# Patient Record
Sex: Female | Born: 1935
Health system: Southern US, Community
[De-identification: ages and names within clinical notes are randomized; demographics above are authoritative.]

## PROBLEM LIST (undated history)

## (undated) DIAGNOSIS — R0989 Other specified symptoms and signs involving the circulatory and respiratory systems: Secondary | ICD-10-CM

## (undated) DIAGNOSIS — R911 Solitary pulmonary nodule: Secondary | ICD-10-CM

## (undated) DIAGNOSIS — G8929 Other chronic pain: Secondary | ICD-10-CM

## (undated) DIAGNOSIS — M810 Age-related osteoporosis without current pathological fracture: Secondary | ICD-10-CM

## (undated) DIAGNOSIS — F419 Anxiety disorder, unspecified: Secondary | ICD-10-CM

## (undated) DIAGNOSIS — R002 Palpitations: Secondary | ICD-10-CM

## (undated) DIAGNOSIS — M1990 Unspecified osteoarthritis, unspecified site: Secondary | ICD-10-CM

## (undated) DIAGNOSIS — K589 Irritable bowel syndrome without diarrhea: Secondary | ICD-10-CM

## (undated) DIAGNOSIS — E785 Hyperlipidemia, unspecified: Secondary | ICD-10-CM

## (undated) DIAGNOSIS — D61818 Other pancytopenia: Secondary | ICD-10-CM

## (undated) DIAGNOSIS — T783XXA Angioneurotic edema, initial encounter: Secondary | ICD-10-CM

## (undated) DIAGNOSIS — G5600 Carpal tunnel syndrome, unspecified upper limb: Secondary | ICD-10-CM

## (undated) DIAGNOSIS — R159 Full incontinence of feces: Secondary | ICD-10-CM

## (undated) DIAGNOSIS — G4700 Insomnia, unspecified: Secondary | ICD-10-CM

## (undated) DIAGNOSIS — J309 Allergic rhinitis, unspecified: Secondary | ICD-10-CM

## (undated) DIAGNOSIS — R109 Unspecified abdominal pain: Secondary | ICD-10-CM

## (undated) DIAGNOSIS — K219 Gastro-esophageal reflux disease without esophagitis: Secondary | ICD-10-CM

## (undated) HISTORY — DX: Irritable bowel syndrome, unspecified: K58.9

## (undated) HISTORY — DX: Unspecified abdominal pain: R10.9

## (undated) HISTORY — DX: Carpal tunnel syndrome, unspecified upper limb: G56.00

## (undated) HISTORY — DX: Insomnia, unspecified: G47.00

## (undated) HISTORY — DX: Age-related osteoporosis without current pathological fracture: M81.0

## (undated) HISTORY — DX: Palpitations: R00.2

## (undated) HISTORY — DX: Other pancytopenia: D61.818

## (undated) HISTORY — DX: Other specified symptoms and signs involving the circulatory and respiratory systems: R09.89

## (undated) HISTORY — DX: Anxiety disorder, unspecified: F41.9

## (undated) HISTORY — DX: Allergic rhinitis, unspecified: J30.9

## (undated) HISTORY — DX: Full incontinence of feces: R15.9

## (undated) HISTORY — DX: Unspecified osteoarthritis, unspecified site: M19.90

## (undated) HISTORY — DX: Hyperlipidemia, unspecified: E78.5

## (undated) HISTORY — PX: EYE SURGERY: SHX253

## (undated) HISTORY — DX: Solitary pulmonary nodule: R91.1

## (undated) HISTORY — DX: Angioneurotic edema, initial encounter: T78.3XXA

## (undated) HISTORY — DX: Other chronic pain: G89.29

## (undated) NOTE — *Deleted (*Deleted)
[facility] EMERGENCY DEPARTMENT Provider Note   CSN: [id number] Arrival date & time: 05/16/20  1438     History Chief Complaint  Patient presents with  . Altered Mental Status    Kathleen Gallegos is a 37 y.o. female.  HPI     Past Medical History:  Diagnosis Date  . Allergic rhinitis, cause unspecified   . Angioneurotic edema not elsewhere classified   . Anxiety disorder   . Asthma   . Carotid bruit   . Carpal tunnel syndrome   . Chronic abdominal pain   . Fecal incontinence   . GERD (gastroesophageal reflux disease)   . Hyperlipidemia   . IBS (irritable bowel syndrome)   . Insomnia   . Lung nodule   . Osteoarthritis   . Palpitations   . Pancytopenia (HCC)   . Senile osteoporosis     Patient Active Problem List   Diagnosis Date Noted  . Unspecified symptoms and signs involving cognitive functions and awareness 12/24/2016  . Vitamin D deficiency 03/12/2015  . Dementia (HCC) 01/04/2015  . Osteoporosis 08/05/2014  . Medicare annual wellness visit, subsequent 04/07/2014  . Cough variant asthma 07/28/2013  . Dry skin dermatitis 02/15/2013  . HEMORRHOIDS, WITH BLEEDING 07/27/2009  . TRANSIENT ISCHEMIC ATTACK 03/29/2009  . PALPITATIONS 03/25/2009  . IRRITABLE BOWEL SYNDROME 10/05/2008  . FECAL INCONTINENCE 10/05/2008  . GERD 05/24/2008  . Hyperlipemia 11/10/2007  . Osteoarthritis 11/10/2007  . Allergic rhinitis due to pollen 09/18/2007  . LUNG NODULE 09/18/2007    Past Surgical History:  Procedure Laterality Date  . ABDOMINAL HYSTERECTOMY  1979   fibroids   . bilateral cataract surgery  2008  . BIOPSY THYROID  1970   . COLONOSCOPY  12/21/04   normal -redundant   . EUS  08/07/07  . EYE SURGERY       OB History   No obstetric history on file.     Family History  Problem Relation Age of Onset  . Hypertension Mother   . Pancreatic cancer Father   . Cancer Brother   . Depression Daughter   . Heart disease Sister   . Leukemia Other         family history   . Colon cancer Other        family history     Social History   Tobacco Use  . Smoking status: Never Smoker  . Smokeless tobacco: Former Neurosurgeon    Types: Snuff  Vaping Use  . Vaping Use: Never used  Substance Use Topics  . Alcohol use: No  . Drug use: Not Currently    Home Medications Prior to Admission medications   Medication Sig Start Date End Date Taking? Authorizing Provider  donepezil (ARICEPT) 10 MG tablet Take 1 tablet (10 mg total) by mouth at bedtime. 12/22/19   Ngetich, [name] C, NP  fexofenadine (ALLEGRA) 180 MG tablet Take 180 mg by mouth daily.    [provider]  fluticasone (FLONASE) 50 MCG/ACT nasal spray Place 1 spray into both nostrils daily.    [provider]  Melatonin 10 MG TABS Take 5 mg by mouth daily.     [provider]  metoprolol succinate (TOPROL-XL) 25 MG 24 hr tablet Take one tablet by mouth once daily. 12/11/19   Ngetich, [name] C, NP  sertraline (ZOLOFT) 50 MG tablet Take 1 tablet (50 mg total) by mouth daily. 05/12/20   Ngetich, [name] C, NP  vitamin C (ASCORBIC ACID) 500 MG tablet Take 500 mg  by mouth daily.    [provider]    Allergies    Diphenhydramine hcl and Rivastigmine tartrate  Review of Systems   Review of Systems  Physical Exam Updated Vital Signs BP 122/84 (BP Location: Right Arm)   Pulse 68   Temp 98.7 F (37.1 C) (Oral)   Resp 16   Ht 5\' 6"  (1.676 m)   Wt 60 kg   SpO2 99%   BMI 21.35 kg/m   Physical Exam  ED Results / Procedures / Treatments   Labs (all labs ordered are listed, but only abnormal results are displayed) Labs Reviewed - No data to display  EKG None  Radiology No results found.  Procedures Procedures (including critical care time)  Medications Ordered in ED Medications - No data to display  ED Course  I have reviewed the triage vital signs and the nursing notes.  Pertinent labs & imaging results that were available during my care of the  patient were reviewed by me and considered in my medical decision making (see chart for details).    MDM Rules/Calculators/A&P                          *** Final Clinical Impression(s) / ED Diagnoses Final diagnoses:  None    Rx / DC Orders ED Discharge Orders    None

---

## 1968-07-09 HISTORY — PX: BIOPSY THYROID: PRO38

## 1977-07-09 HISTORY — PX: ABDOMINAL HYSTERECTOMY: SHX81

## 2001-01-02 ENCOUNTER — Ambulatory Visit (HOSPITAL_COMMUNITY): Admission: RE | Admit: 2001-01-02 | Discharge: 2001-01-06 | Payer: Self-pay | Admitting: Family Medicine

## 2001-01-06 ENCOUNTER — Encounter: Payer: Self-pay | Admitting: Family Medicine

## 2001-03-05 ENCOUNTER — Other Ambulatory Visit: Admission: RE | Admit: 2001-03-05 | Discharge: 2001-03-05 | Payer: Self-pay | Admitting: Family Medicine

## 2001-06-18 ENCOUNTER — Ambulatory Visit (HOSPITAL_COMMUNITY): Admission: RE | Admit: 2001-06-18 | Discharge: 2001-06-18 | Payer: Self-pay | Admitting: Family Medicine

## 2001-06-18 ENCOUNTER — Encounter: Payer: Self-pay | Admitting: Family Medicine

## 2001-09-17 ENCOUNTER — Ambulatory Visit (HOSPITAL_COMMUNITY): Admission: RE | Admit: 2001-09-17 | Discharge: 2001-09-17 | Payer: Self-pay | Admitting: Family Medicine

## 2001-09-17 ENCOUNTER — Encounter: Payer: Self-pay | Admitting: Family Medicine

## 2002-06-18 ENCOUNTER — Ambulatory Visit (HOSPITAL_COMMUNITY): Admission: RE | Admit: 2002-06-18 | Discharge: 2002-06-18 | Payer: Self-pay | Admitting: Internal Medicine

## 2002-06-18 ENCOUNTER — Encounter: Payer: Self-pay | Admitting: Internal Medicine

## 2003-06-23 ENCOUNTER — Ambulatory Visit (HOSPITAL_COMMUNITY): Admission: RE | Admit: 2003-06-23 | Discharge: 2003-06-23 | Payer: Self-pay | Admitting: Internal Medicine

## 2004-05-19 ENCOUNTER — Ambulatory Visit: Payer: Self-pay | Admitting: Family Medicine

## 2004-06-21 ENCOUNTER — Emergency Department (HOSPITAL_COMMUNITY): Admission: EM | Admit: 2004-06-21 | Discharge: 2004-06-22 | Payer: Self-pay | Admitting: *Deleted

## 2004-06-21 ENCOUNTER — Ambulatory Visit: Payer: Self-pay | Admitting: Family Medicine

## 2004-07-07 ENCOUNTER — Ambulatory Visit (HOSPITAL_COMMUNITY): Admission: RE | Admit: 2004-07-07 | Discharge: 2004-07-07 | Payer: Self-pay | Admitting: Family Medicine

## 2004-07-18 ENCOUNTER — Ambulatory Visit: Payer: Self-pay | Admitting: Family Medicine

## 2004-07-25 ENCOUNTER — Ambulatory Visit: Payer: Self-pay | Admitting: Internal Medicine

## 2004-08-24 ENCOUNTER — Ambulatory Visit: Payer: Self-pay | Admitting: Internal Medicine

## 2004-09-05 ENCOUNTER — Ambulatory Visit: Payer: Self-pay | Admitting: Family Medicine

## 2004-10-06 ENCOUNTER — Ambulatory Visit: Payer: Self-pay | Admitting: Internal Medicine

## 2004-10-31 ENCOUNTER — Ambulatory Visit: Payer: Self-pay | Admitting: Family Medicine

## 2004-11-22 ENCOUNTER — Ambulatory Visit: Payer: Self-pay | Admitting: Internal Medicine

## 2004-12-15 ENCOUNTER — Ambulatory Visit: Payer: Self-pay | Admitting: Family Medicine

## 2004-12-21 ENCOUNTER — Ambulatory Visit (HOSPITAL_COMMUNITY): Admission: RE | Admit: 2004-12-21 | Discharge: 2004-12-21 | Payer: Self-pay | Admitting: Internal Medicine

## 2004-12-21 ENCOUNTER — Ambulatory Visit: Payer: Self-pay | Admitting: Internal Medicine

## 2004-12-21 HISTORY — PX: COLONOSCOPY: SHX174

## 2004-12-25 ENCOUNTER — Emergency Department (HOSPITAL_COMMUNITY): Admission: EM | Admit: 2004-12-25 | Discharge: 2004-12-25 | Payer: Self-pay | Admitting: Emergency Medicine

## 2004-12-28 ENCOUNTER — Ambulatory Visit: Payer: Self-pay | Admitting: Family Medicine

## 2005-02-19 ENCOUNTER — Ambulatory Visit: Payer: Self-pay | Admitting: Family Medicine

## 2005-03-15 ENCOUNTER — Ambulatory Visit: Payer: Self-pay | Admitting: Family Medicine

## 2005-03-20 ENCOUNTER — Ambulatory Visit: Payer: Self-pay | Admitting: Internal Medicine

## 2005-04-09 ENCOUNTER — Encounter: Payer: Self-pay | Admitting: Family Medicine

## 2005-04-18 ENCOUNTER — Ambulatory Visit: Payer: Self-pay | Admitting: Family Medicine

## 2005-04-23 ENCOUNTER — Ambulatory Visit: Payer: Self-pay | Admitting: Orthopedic Surgery

## 2005-06-05 ENCOUNTER — Ambulatory Visit: Payer: Self-pay | Admitting: Internal Medicine

## 2005-07-04 ENCOUNTER — Ambulatory Visit: Payer: Self-pay | Admitting: Family Medicine

## 2005-07-10 ENCOUNTER — Ambulatory Visit (HOSPITAL_COMMUNITY): Admission: RE | Admit: 2005-07-10 | Discharge: 2005-07-10 | Payer: Self-pay | Admitting: Family Medicine

## 2005-07-12 ENCOUNTER — Ambulatory Visit: Payer: Self-pay | Admitting: Internal Medicine

## 2005-08-20 ENCOUNTER — Ambulatory Visit: Payer: Self-pay | Admitting: Family Medicine

## 2005-10-08 ENCOUNTER — Ambulatory Visit: Payer: Self-pay | Admitting: Family Medicine

## 2005-10-10 ENCOUNTER — Encounter (HOSPITAL_COMMUNITY): Admission: RE | Admit: 2005-10-10 | Discharge: 2005-11-09 | Payer: Self-pay | Admitting: Family Medicine

## 2005-11-05 ENCOUNTER — Ambulatory Visit: Payer: Self-pay | Admitting: Family Medicine

## 2005-11-12 ENCOUNTER — Ambulatory Visit: Payer: Self-pay | Admitting: Internal Medicine

## 2005-11-15 ENCOUNTER — Ambulatory Visit: Payer: Self-pay | Admitting: Orthopedic Surgery

## 2006-01-21 ENCOUNTER — Ambulatory Visit: Payer: Self-pay | Admitting: Internal Medicine

## 2006-02-11 ENCOUNTER — Ambulatory Visit: Payer: Self-pay | Admitting: Family Medicine

## 2006-02-14 ENCOUNTER — Ambulatory Visit (HOSPITAL_COMMUNITY): Admission: RE | Admit: 2006-02-14 | Discharge: 2006-02-14 | Payer: Self-pay | Admitting: Family Medicine

## 2006-03-29 ENCOUNTER — Ambulatory Visit: Payer: Self-pay | Admitting: Internal Medicine

## 2006-04-18 ENCOUNTER — Ambulatory Visit: Payer: Self-pay | Admitting: Family Medicine

## 2006-04-25 ENCOUNTER — Ambulatory Visit (HOSPITAL_COMMUNITY): Admission: RE | Admit: 2006-04-25 | Discharge: 2006-04-25 | Payer: Self-pay | Admitting: Family Medicine

## 2006-04-26 ENCOUNTER — Encounter (HOSPITAL_COMMUNITY): Admission: RE | Admit: 2006-04-26 | Discharge: 2006-05-26 | Payer: Self-pay | Admitting: Family Medicine

## 2006-05-15 ENCOUNTER — Ambulatory Visit: Payer: Self-pay | Admitting: Family Medicine

## 2006-05-29 ENCOUNTER — Ambulatory Visit: Payer: Self-pay | Admitting: Family Medicine

## 2006-06-05 ENCOUNTER — Ambulatory Visit: Payer: Self-pay | Admitting: Family Medicine

## 2006-06-12 ENCOUNTER — Ambulatory Visit: Payer: Self-pay | Admitting: Family Medicine

## 2006-06-18 ENCOUNTER — Emergency Department (HOSPITAL_COMMUNITY): Admission: EM | Admit: 2006-06-18 | Discharge: 2006-06-18 | Payer: Self-pay | Admitting: Emergency Medicine

## 2006-06-20 ENCOUNTER — Ambulatory Visit: Payer: Self-pay | Admitting: Family Medicine

## 2006-06-20 ENCOUNTER — Ambulatory Visit (HOSPITAL_COMMUNITY): Admission: RE | Admit: 2006-06-20 | Discharge: 2006-06-20 | Payer: Self-pay | Admitting: Family Medicine

## 2006-07-09 HISTORY — PX: OTHER SURGICAL HISTORY: SHX169

## 2006-07-15 ENCOUNTER — Ambulatory Visit (HOSPITAL_COMMUNITY): Admission: RE | Admit: 2006-07-15 | Discharge: 2006-07-15 | Payer: Self-pay | Admitting: Family Medicine

## 2006-07-22 ENCOUNTER — Ambulatory Visit: Payer: Self-pay | Admitting: Internal Medicine

## 2006-07-23 ENCOUNTER — Ambulatory Visit: Payer: Self-pay | Admitting: Family Medicine

## 2006-07-23 ENCOUNTER — Ambulatory Visit (HOSPITAL_COMMUNITY): Admission: RE | Admit: 2006-07-23 | Discharge: 2006-07-23 | Payer: Self-pay | Admitting: Family Medicine

## 2006-07-31 ENCOUNTER — Ambulatory Visit: Payer: Self-pay | Admitting: Family Medicine

## 2006-08-07 ENCOUNTER — Ambulatory Visit: Payer: Self-pay | Admitting: Family Medicine

## 2006-08-14 ENCOUNTER — Ambulatory Visit: Payer: Self-pay | Admitting: Family Medicine

## 2006-08-23 ENCOUNTER — Ambulatory Visit: Payer: Self-pay | Admitting: Family Medicine

## 2006-08-24 ENCOUNTER — Encounter: Payer: Self-pay | Admitting: Family Medicine

## 2006-08-24 LAB — CONVERTED CEMR LAB
Chlamydia, DNA Probe: NEGATIVE
Gardnerella vaginalis: NEGATIVE

## 2006-08-28 ENCOUNTER — Ambulatory Visit: Payer: Self-pay | Admitting: Family Medicine

## 2006-09-05 ENCOUNTER — Ambulatory Visit: Payer: Self-pay | Admitting: Family Medicine

## 2006-09-05 ENCOUNTER — Ambulatory Visit: Payer: Self-pay | Admitting: Internal Medicine

## 2006-09-11 ENCOUNTER — Ambulatory Visit: Payer: Self-pay | Admitting: Family Medicine

## 2006-09-18 ENCOUNTER — Ambulatory Visit: Payer: Self-pay | Admitting: Family Medicine

## 2006-09-26 ENCOUNTER — Ambulatory Visit: Payer: Self-pay | Admitting: Family Medicine

## 2006-10-02 ENCOUNTER — Ambulatory Visit: Payer: Self-pay | Admitting: Family Medicine

## 2006-10-11 ENCOUNTER — Ambulatory Visit: Payer: Self-pay | Admitting: Family Medicine

## 2006-10-16 ENCOUNTER — Ambulatory Visit: Payer: Self-pay | Admitting: Family Medicine

## 2006-10-31 ENCOUNTER — Ambulatory Visit: Payer: Self-pay | Admitting: Family Medicine

## 2006-11-06 ENCOUNTER — Ambulatory Visit: Payer: Self-pay | Admitting: Family Medicine

## 2006-11-13 ENCOUNTER — Ambulatory Visit: Payer: Self-pay | Admitting: Family Medicine

## 2006-11-20 ENCOUNTER — Ambulatory Visit: Payer: Self-pay | Admitting: Family Medicine

## 2006-11-26 ENCOUNTER — Ambulatory Visit: Payer: Self-pay | Admitting: Internal Medicine

## 2006-11-28 ENCOUNTER — Ambulatory Visit: Payer: Self-pay | Admitting: Family Medicine

## 2006-12-04 ENCOUNTER — Ambulatory Visit: Payer: Self-pay | Admitting: Family Medicine

## 2006-12-09 ENCOUNTER — Ambulatory Visit: Payer: Self-pay | Admitting: Internal Medicine

## 2006-12-11 ENCOUNTER — Ambulatory Visit: Payer: Self-pay | Admitting: Family Medicine

## 2006-12-18 ENCOUNTER — Ambulatory Visit (HOSPITAL_COMMUNITY): Admission: RE | Admit: 2006-12-18 | Discharge: 2006-12-18 | Payer: Self-pay | Admitting: Internal Medicine

## 2006-12-19 ENCOUNTER — Ambulatory Visit: Payer: Self-pay | Admitting: Family Medicine

## 2006-12-26 ENCOUNTER — Ambulatory Visit: Payer: Self-pay | Admitting: Family Medicine

## 2006-12-27 ENCOUNTER — Encounter: Payer: Self-pay | Admitting: Family Medicine

## 2006-12-27 ENCOUNTER — Ambulatory Visit (HOSPITAL_COMMUNITY): Admission: RE | Admit: 2006-12-27 | Discharge: 2006-12-27 | Payer: Self-pay | Admitting: Family Medicine

## 2006-12-27 LAB — CONVERTED CEMR LAB
ALT: 18 units/L (ref 0–35)
AST: 23 units/L (ref 0–37)
Alkaline Phosphatase: 46 units/L (ref 39–117)
CO2: 32 meq/L (ref 19–32)
Chloride: 102 meq/L (ref 96–112)
Creatinine, Ser: 0.75 mg/dL (ref 0.40–1.20)
Indirect Bilirubin: 0.6 mg/dL (ref 0.0–0.9)
Potassium: 4 meq/L (ref 3.5–5.3)
Sodium: 136 meq/L (ref 135–145)
Total Protein: 7.9 g/dL (ref 6.0–8.3)

## 2006-12-31 ENCOUNTER — Ambulatory Visit (HOSPITAL_COMMUNITY): Admission: RE | Admit: 2006-12-31 | Discharge: 2006-12-31 | Payer: Self-pay | Admitting: Ophthalmology

## 2007-01-06 ENCOUNTER — Ambulatory Visit: Payer: Self-pay | Admitting: Family Medicine

## 2007-01-17 ENCOUNTER — Ambulatory Visit: Payer: Self-pay | Admitting: Family Medicine

## 2007-01-21 ENCOUNTER — Ambulatory Visit (HOSPITAL_COMMUNITY): Admission: RE | Admit: 2007-01-21 | Discharge: 2007-01-21 | Payer: Self-pay | Admitting: Ophthalmology

## 2007-01-24 ENCOUNTER — Ambulatory Visit: Payer: Self-pay | Admitting: Family Medicine

## 2007-01-27 ENCOUNTER — Ambulatory Visit: Payer: Self-pay | Admitting: Internal Medicine

## 2007-01-29 ENCOUNTER — Ambulatory Visit: Payer: Self-pay | Admitting: Family Medicine

## 2007-01-30 ENCOUNTER — Ambulatory Visit: Payer: Self-pay | Admitting: Internal Medicine

## 2007-02-03 ENCOUNTER — Ambulatory Visit (HOSPITAL_COMMUNITY): Admission: RE | Admit: 2007-02-03 | Discharge: 2007-02-03 | Payer: Self-pay | Admitting: Family Medicine

## 2007-02-05 ENCOUNTER — Ambulatory Visit: Payer: Self-pay | Admitting: Family Medicine

## 2007-02-07 ENCOUNTER — Ambulatory Visit: Payer: Self-pay | Admitting: Internal Medicine

## 2007-02-10 ENCOUNTER — Ambulatory Visit: Payer: Self-pay | Admitting: Internal Medicine

## 2007-02-10 ENCOUNTER — Ambulatory Visit (HOSPITAL_COMMUNITY): Admission: RE | Admit: 2007-02-10 | Discharge: 2007-02-10 | Payer: Self-pay | Admitting: Internal Medicine

## 2007-02-11 ENCOUNTER — Ambulatory Visit (HOSPITAL_COMMUNITY): Admission: RE | Admit: 2007-02-11 | Discharge: 2007-02-11 | Payer: Self-pay | Admitting: Internal Medicine

## 2007-02-18 ENCOUNTER — Ambulatory Visit (HOSPITAL_COMMUNITY): Admission: RE | Admit: 2007-02-18 | Discharge: 2007-02-18 | Payer: Self-pay | Admitting: Ophthalmology

## 2007-03-06 ENCOUNTER — Ambulatory Visit: Payer: Self-pay | Admitting: Family Medicine

## 2007-03-13 ENCOUNTER — Ambulatory Visit: Payer: Self-pay | Admitting: Family Medicine

## 2007-03-25 ENCOUNTER — Ambulatory Visit: Payer: Self-pay | Admitting: Internal Medicine

## 2007-03-25 ENCOUNTER — Ambulatory Visit: Payer: Self-pay | Admitting: Family Medicine

## 2007-03-31 ENCOUNTER — Ambulatory Visit: Payer: Self-pay | Admitting: Family Medicine

## 2007-04-07 ENCOUNTER — Ambulatory Visit: Payer: Self-pay | Admitting: Family Medicine

## 2007-04-16 ENCOUNTER — Ambulatory Visit: Payer: Self-pay | Admitting: Family Medicine

## 2007-04-21 ENCOUNTER — Encounter: Payer: Self-pay | Admitting: Family Medicine

## 2007-04-21 ENCOUNTER — Ambulatory Visit: Payer: Self-pay | Admitting: Family Medicine

## 2007-04-21 ENCOUNTER — Other Ambulatory Visit: Admission: RE | Admit: 2007-04-21 | Discharge: 2007-04-21 | Payer: Self-pay | Admitting: Family Medicine

## 2007-04-21 ENCOUNTER — Encounter (INDEPENDENT_AMBULATORY_CARE_PROVIDER_SITE_OTHER): Payer: Self-pay | Admitting: *Deleted

## 2007-04-21 LAB — CONVERTED CEMR LAB: Pap Smear: NORMAL

## 2007-04-23 ENCOUNTER — Ambulatory Visit: Payer: Self-pay | Admitting: Family Medicine

## 2007-05-08 ENCOUNTER — Ambulatory Visit: Payer: Self-pay | Admitting: Family Medicine

## 2007-05-08 LAB — CONVERTED CEMR LAB
ALT: 13 units/L (ref 0–35)
Albumin: 4.2 g/dL (ref 3.5–5.2)
Alkaline Phosphatase: 29 units/L — ABNORMAL LOW (ref 39–117)
Basophils Absolute: 0 10*3/uL (ref 0.0–0.1)
Basophils Relative: 0 % (ref 0–1)
Chloride: 104 meq/L (ref 96–112)
Cholesterol: 177 mg/dL (ref 0–200)
Eosinophils Absolute: 0.2 10*3/uL (ref 0.0–0.7)
HDL: 79 mg/dL (ref >39)
Indirect Bilirubin: 0.4 mg/dL (ref 0.0–0.9)
LDL Cholesterol: 87 mg/dL (ref 0–99)
MCHC: 32.2 g/dL (ref 30.0–36.0)
MCV: 102.6 fL — ABNORMAL HIGH (ref 78.0–100.0)
Neutro Abs: 1.6 10*3/uL — ABNORMAL LOW (ref 1.7–7.7)
Neutrophils Relative %: 40 % — ABNORMAL LOW (ref 43–77)
Platelets: 145 10*3/uL — ABNORMAL LOW (ref 150–400)
Potassium: 4.3 meq/L (ref 3.5–5.3)
RDW: 12.6 % (ref 11.5–14.0)
Total Protein: 8.1 g/dL (ref 6.0–8.3)
Triglycerides: 57 mg/dL (ref <150)
VLDL: 11 mg/dL (ref 0–40)

## 2007-05-15 ENCOUNTER — Ambulatory Visit: Payer: Self-pay | Admitting: Family Medicine

## 2007-05-21 ENCOUNTER — Ambulatory Visit: Payer: Self-pay | Admitting: Family Medicine

## 2007-05-27 ENCOUNTER — Ambulatory Visit: Payer: Self-pay | Admitting: Internal Medicine

## 2007-06-02 ENCOUNTER — Ambulatory Visit: Payer: Self-pay | Admitting: Family Medicine

## 2007-06-11 ENCOUNTER — Ambulatory Visit: Payer: Self-pay | Admitting: Family Medicine

## 2007-06-19 ENCOUNTER — Ambulatory Visit: Payer: Self-pay | Admitting: Family Medicine

## 2007-06-26 ENCOUNTER — Ambulatory Visit: Payer: Self-pay | Admitting: Family Medicine

## 2007-06-26 LAB — CONVERTED CEMR LAB
BUN: 17 mg/dL (ref 6–23)
Chloride: 103 meq/L (ref 96–112)
Creatinine, Ser: 0.86 mg/dL (ref 0.40–1.20)
Potassium: 4.3 meq/L (ref 3.5–5.3)

## 2007-06-30 ENCOUNTER — Ambulatory Visit (HOSPITAL_COMMUNITY): Admission: RE | Admit: 2007-06-30 | Discharge: 2007-06-30 | Payer: Self-pay | Admitting: Family Medicine

## 2007-07-08 ENCOUNTER — Ambulatory Visit: Payer: Self-pay | Admitting: Family Medicine

## 2007-07-10 ENCOUNTER — Encounter: Payer: Self-pay | Admitting: Family Medicine

## 2007-07-17 ENCOUNTER — Ambulatory Visit: Payer: Self-pay | Admitting: Family Medicine

## 2007-07-18 ENCOUNTER — Ambulatory Visit (HOSPITAL_COMMUNITY): Admission: RE | Admit: 2007-07-18 | Discharge: 2007-07-18 | Payer: Self-pay | Admitting: Family Medicine

## 2007-07-21 ENCOUNTER — Ambulatory Visit: Payer: Self-pay | Admitting: Orthopedic Surgery

## 2007-07-21 ENCOUNTER — Ambulatory Visit: Payer: Self-pay | Admitting: Internal Medicine

## 2007-07-21 DIAGNOSIS — G5600 Carpal tunnel syndrome, unspecified upper limb: Secondary | ICD-10-CM | POA: Insufficient documentation

## 2007-07-24 ENCOUNTER — Ambulatory Visit: Payer: Self-pay | Admitting: Internal Medicine

## 2007-07-24 ENCOUNTER — Ambulatory Visit: Payer: Self-pay | Admitting: Family Medicine

## 2007-08-05 ENCOUNTER — Ambulatory Visit (HOSPITAL_COMMUNITY): Admission: RE | Admit: 2007-08-05 | Discharge: 2007-08-05 | Payer: Self-pay | Admitting: Internal Medicine

## 2007-08-07 ENCOUNTER — Ambulatory Visit (HOSPITAL_COMMUNITY): Admission: RE | Admit: 2007-08-07 | Discharge: 2007-08-07 | Payer: Self-pay | Admitting: Gastroenterology

## 2007-08-07 ENCOUNTER — Encounter: Payer: Self-pay | Admitting: Gastroenterology

## 2007-08-07 HISTORY — PX: EUS: SHX5427

## 2007-08-11 ENCOUNTER — Ambulatory Visit: Payer: Self-pay | Admitting: Family Medicine

## 2007-08-13 ENCOUNTER — Ambulatory Visit: Payer: Self-pay | Admitting: Gastroenterology

## 2007-08-26 ENCOUNTER — Ambulatory Visit: Payer: Self-pay | Admitting: Family Medicine

## 2007-09-02 ENCOUNTER — Encounter: Payer: Self-pay | Admitting: Family Medicine

## 2007-09-02 LAB — CONVERTED CEMR LAB
ALT: 15 units/L (ref 0–35)
Albumin: 4.1 g/dL (ref 3.5–5.2)
BUN: 13 mg/dL (ref 6–23)
Chloride: 105 meq/L (ref 96–112)
Cholesterol: 146 mg/dL (ref 0–200)
HDL: 79 mg/dL (ref >39)
LDL Cholesterol: 59 mg/dL (ref 0–99)
Potassium: 4.1 meq/L (ref 3.5–5.3)
Sodium: 142 meq/L (ref 135–145)
Total CHOL/HDL Ratio: 1.8
Total Protein: 7.8 g/dL (ref 6.0–8.3)
Triglycerides: 39 mg/dL (ref <150)
VLDL: 8 mg/dL (ref 0–40)

## 2007-09-04 ENCOUNTER — Ambulatory Visit: Payer: Self-pay | Admitting: Family Medicine

## 2007-09-05 ENCOUNTER — Ambulatory Visit (HOSPITAL_COMMUNITY): Admission: RE | Admit: 2007-09-05 | Discharge: 2007-09-05 | Payer: Self-pay | Admitting: Internal Medicine

## 2007-09-12 ENCOUNTER — Ambulatory Visit: Payer: Self-pay | Admitting: Family Medicine

## 2007-09-18 ENCOUNTER — Ambulatory Visit (HOSPITAL_COMMUNITY): Admission: RE | Admit: 2007-09-18 | Discharge: 2007-09-18 | Payer: Self-pay | Admitting: Internal Medicine

## 2007-09-18 ENCOUNTER — Ambulatory Visit: Payer: Self-pay | Admitting: Internal Medicine

## 2007-09-18 DIAGNOSIS — T783XXA Angioneurotic edema, initial encounter: Secondary | ICD-10-CM | POA: Insufficient documentation

## 2007-09-18 DIAGNOSIS — J984 Other disorders of lung: Secondary | ICD-10-CM

## 2007-09-18 DIAGNOSIS — J45998 Other asthma: Secondary | ICD-10-CM | POA: Insufficient documentation

## 2007-09-18 DIAGNOSIS — J301 Allergic rhinitis due to pollen: Secondary | ICD-10-CM

## 2007-09-19 ENCOUNTER — Ambulatory Visit: Payer: Self-pay | Admitting: Family Medicine

## 2007-09-25 ENCOUNTER — Ambulatory Visit (HOSPITAL_COMMUNITY): Admission: RE | Admit: 2007-09-25 | Discharge: 2007-09-25 | Payer: Self-pay | Admitting: Family Medicine

## 2007-10-02 ENCOUNTER — Ambulatory Visit: Payer: Self-pay | Admitting: Family Medicine

## 2007-10-09 ENCOUNTER — Ambulatory Visit: Payer: Self-pay | Admitting: Family Medicine

## 2007-10-13 ENCOUNTER — Encounter: Payer: Self-pay | Admitting: Internal Medicine

## 2007-10-15 ENCOUNTER — Ambulatory Visit: Payer: Self-pay | Admitting: Internal Medicine

## 2007-10-20 ENCOUNTER — Ambulatory Visit: Payer: Self-pay | Admitting: Family Medicine

## 2007-10-29 ENCOUNTER — Ambulatory Visit: Payer: Self-pay | Admitting: Family Medicine

## 2007-11-06 ENCOUNTER — Ambulatory Visit: Payer: Self-pay | Admitting: Internal Medicine

## 2007-11-10 ENCOUNTER — Encounter (INDEPENDENT_AMBULATORY_CARE_PROVIDER_SITE_OTHER): Payer: Self-pay | Admitting: *Deleted

## 2007-11-10 DIAGNOSIS — F411 Generalized anxiety disorder: Secondary | ICD-10-CM

## 2007-11-10 DIAGNOSIS — E785 Hyperlipidemia, unspecified: Secondary | ICD-10-CM | POA: Insufficient documentation

## 2007-11-10 DIAGNOSIS — M1990 Unspecified osteoarthritis, unspecified site: Secondary | ICD-10-CM | POA: Insufficient documentation

## 2007-11-11 ENCOUNTER — Ambulatory Visit (HOSPITAL_COMMUNITY): Admission: RE | Admit: 2007-11-11 | Discharge: 2007-11-11 | Payer: Self-pay | Admitting: Family Medicine

## 2007-11-11 ENCOUNTER — Ambulatory Visit: Payer: Self-pay | Admitting: Family Medicine

## 2007-11-20 ENCOUNTER — Encounter: Payer: Self-pay | Admitting: Internal Medicine

## 2007-11-24 ENCOUNTER — Ambulatory Visit: Payer: Self-pay | Admitting: Family Medicine

## 2007-12-09 ENCOUNTER — Ambulatory Visit: Payer: Self-pay | Admitting: Family Medicine

## 2007-12-19 ENCOUNTER — Ambulatory Visit: Payer: Self-pay | Admitting: Family Medicine

## 2007-12-26 ENCOUNTER — Ambulatory Visit: Payer: Self-pay | Admitting: Family Medicine

## 2007-12-26 LAB — CONVERTED CEMR LAB
Eosinophils Absolute: 0.1 10*3/uL (ref 0.0–0.7)
INR: 1 (ref 0.0–1.5)
Lymphocytes Relative: 32 % (ref 12–46)
Lymphs Abs: 1.4 10*3/uL (ref 0.7–4.0)
MCV: 101.6 fL — ABNORMAL HIGH (ref 78.0–100.0)
Monocytes Relative: 14 % — ABNORMAL HIGH (ref 3–12)
Neutro Abs: 2.3 10*3/uL (ref 1.7–7.7)
Neutrophils Relative %: 52 % (ref 43–77)
Platelets: 111 10*3/uL — ABNORMAL LOW (ref 150–400)
Prothrombin Time: 13.2 s (ref 11.6–15.2)
RBC: 3.45 M/uL — ABNORMAL LOW (ref 3.87–5.11)
WBC: 4.5 10*3/uL (ref 4.0–10.5)
aPTT: 34 s (ref 24–37)

## 2007-12-30 ENCOUNTER — Encounter: Payer: Self-pay | Admitting: Family Medicine

## 2007-12-30 LAB — CONVERTED CEMR LAB
Candida species: NEGATIVE
GC Probe Amp, Genital: NEGATIVE

## 2008-01-02 ENCOUNTER — Ambulatory Visit: Payer: Self-pay | Admitting: Family Medicine

## 2008-01-07 ENCOUNTER — Telehealth (INDEPENDENT_AMBULATORY_CARE_PROVIDER_SITE_OTHER): Payer: Self-pay | Admitting: *Deleted

## 2008-01-13 ENCOUNTER — Ambulatory Visit: Payer: Self-pay | Admitting: Family Medicine

## 2008-01-23 ENCOUNTER — Ambulatory Visit: Payer: Self-pay | Admitting: Family Medicine

## 2008-01-27 ENCOUNTER — Ambulatory Visit: Payer: Self-pay | Admitting: Internal Medicine

## 2008-01-30 ENCOUNTER — Ambulatory Visit: Payer: Self-pay | Admitting: Family Medicine

## 2008-02-05 ENCOUNTER — Ambulatory Visit: Payer: Self-pay | Admitting: Family Medicine

## 2008-02-06 ENCOUNTER — Ambulatory Visit: Payer: Self-pay | Admitting: Internal Medicine

## 2008-02-12 ENCOUNTER — Encounter (INDEPENDENT_AMBULATORY_CARE_PROVIDER_SITE_OTHER): Payer: Self-pay | Admitting: *Deleted

## 2008-02-12 ENCOUNTER — Encounter: Payer: Self-pay | Admitting: Family Medicine

## 2008-02-12 ENCOUNTER — Ambulatory Visit: Payer: Self-pay | Admitting: Family Medicine

## 2008-02-12 ENCOUNTER — Telehealth: Payer: Self-pay | Admitting: Family Medicine

## 2008-02-13 ENCOUNTER — Ambulatory Visit: Payer: Self-pay | Admitting: Family Medicine

## 2008-02-13 LAB — CONVERTED CEMR LAB
AST: 22 units/L (ref 0–37)
BUN: 13 mg/dL (ref 6–23)
Bilirubin, Direct: 0.2 mg/dL (ref 0.0–0.3)
CO2: 28 meq/L (ref 19–32)
Calcium: 9.1 mg/dL (ref 8.4–10.5)
Glucose, Bld: 86 mg/dL (ref 70–99)
Indirect Bilirubin: 0.2 mg/dL (ref 0.0–0.9)
Sodium: 142 meq/L (ref 135–145)
Total Bilirubin: 0.4 mg/dL (ref 0.3–1.2)
Total CHOL/HDL Ratio: 1.9
VLDL: 8 mg/dL (ref 0–40)

## 2008-02-20 ENCOUNTER — Ambulatory Visit: Payer: Self-pay | Admitting: Family Medicine

## 2008-02-23 ENCOUNTER — Telehealth: Payer: Self-pay | Admitting: Family Medicine

## 2008-02-27 ENCOUNTER — Ambulatory Visit: Payer: Self-pay | Admitting: Family Medicine

## 2008-03-02 ENCOUNTER — Encounter: Payer: Self-pay | Admitting: Family Medicine

## 2008-03-05 ENCOUNTER — Ambulatory Visit: Payer: Self-pay | Admitting: Family Medicine

## 2008-03-12 ENCOUNTER — Ambulatory Visit: Payer: Self-pay | Admitting: Family Medicine

## 2008-03-19 ENCOUNTER — Telehealth: Payer: Self-pay | Admitting: Family Medicine

## 2008-04-01 ENCOUNTER — Encounter: Payer: Self-pay | Admitting: Family Medicine

## 2008-04-05 ENCOUNTER — Encounter: Payer: Self-pay | Admitting: Family Medicine

## 2008-04-08 ENCOUNTER — Ambulatory Visit: Payer: Self-pay | Admitting: Family Medicine

## 2008-04-14 ENCOUNTER — Encounter: Payer: Self-pay | Admitting: Family Medicine

## 2008-04-15 ENCOUNTER — Ambulatory Visit: Payer: Self-pay | Admitting: Family Medicine

## 2008-04-22 ENCOUNTER — Ambulatory Visit: Payer: Self-pay | Admitting: Family Medicine

## 2008-04-29 ENCOUNTER — Ambulatory Visit: Payer: Self-pay | Admitting: Family Medicine

## 2008-05-13 ENCOUNTER — Ambulatory Visit: Payer: Self-pay | Admitting: Family Medicine

## 2008-05-13 ENCOUNTER — Ambulatory Visit: Payer: Self-pay | Admitting: Internal Medicine

## 2008-05-13 ENCOUNTER — Telehealth: Payer: Self-pay | Admitting: Internal Medicine

## 2008-05-20 ENCOUNTER — Ambulatory Visit: Payer: Self-pay | Admitting: Family Medicine

## 2008-05-24 ENCOUNTER — Ambulatory Visit (HOSPITAL_COMMUNITY): Admission: RE | Admit: 2008-05-24 | Discharge: 2008-05-24 | Payer: Self-pay | Admitting: Family Medicine

## 2008-05-24 DIAGNOSIS — B353 Tinea pedis: Secondary | ICD-10-CM | POA: Insufficient documentation

## 2008-05-24 DIAGNOSIS — K219 Gastro-esophageal reflux disease without esophagitis: Secondary | ICD-10-CM

## 2008-05-26 ENCOUNTER — Ambulatory Visit: Payer: Self-pay | Admitting: Internal Medicine

## 2008-06-07 ENCOUNTER — Telehealth: Payer: Self-pay | Admitting: Family Medicine

## 2008-06-08 ENCOUNTER — Ambulatory Visit: Payer: Self-pay | Admitting: Family Medicine

## 2008-06-14 ENCOUNTER — Ambulatory Visit: Payer: Self-pay | Admitting: Family Medicine

## 2008-06-14 ENCOUNTER — Ambulatory Visit (HOSPITAL_COMMUNITY): Admission: RE | Admit: 2008-06-14 | Discharge: 2008-06-14 | Payer: Self-pay | Admitting: Internal Medicine

## 2008-06-15 ENCOUNTER — Telehealth: Payer: Self-pay | Admitting: Family Medicine

## 2008-06-18 ENCOUNTER — Telehealth: Payer: Self-pay | Admitting: Family Medicine

## 2008-06-21 ENCOUNTER — Ambulatory Visit (HOSPITAL_COMMUNITY): Admission: RE | Admit: 2008-06-21 | Discharge: 2008-06-21 | Payer: Self-pay | Admitting: Family Medicine

## 2008-06-21 ENCOUNTER — Ambulatory Visit: Payer: Self-pay | Admitting: Family Medicine

## 2008-06-22 ENCOUNTER — Telehealth (INDEPENDENT_AMBULATORY_CARE_PROVIDER_SITE_OTHER): Payer: Self-pay | Admitting: *Deleted

## 2008-06-23 ENCOUNTER — Encounter: Payer: Self-pay | Admitting: Family Medicine

## 2008-07-05 ENCOUNTER — Ambulatory Visit: Payer: Self-pay | Admitting: Family Medicine

## 2008-07-15 ENCOUNTER — Ambulatory Visit: Payer: Self-pay | Admitting: Family Medicine

## 2008-07-23 ENCOUNTER — Emergency Department (HOSPITAL_COMMUNITY): Admission: EM | Admit: 2008-07-23 | Discharge: 2008-07-24 | Payer: Self-pay | Admitting: Emergency Medicine

## 2008-07-25 DIAGNOSIS — M810 Age-related osteoporosis without current pathological fracture: Secondary | ICD-10-CM

## 2008-07-26 ENCOUNTER — Ambulatory Visit: Payer: Self-pay | Admitting: Family Medicine

## 2008-07-26 ENCOUNTER — Telehealth: Payer: Self-pay | Admitting: Family Medicine

## 2008-07-27 ENCOUNTER — Encounter: Payer: Self-pay | Admitting: Family Medicine

## 2008-07-27 LAB — CONVERTED CEMR LAB
ALT: 15 units/L (ref 0–35)
AST: 19 units/L (ref 0–37)
Albumin: 4 g/dL (ref 3.5–5.2)
Cholesterol: 163 mg/dL (ref 0–200)
HDL: 82 mg/dL (ref >39)
TSH: 3.339 microintl units/mL (ref 0.350–4.50)
Total CHOL/HDL Ratio: 2
Total Protein: 7.6 g/dL (ref 6.0–8.3)
VLDL: 9 mg/dL (ref 0–40)

## 2008-07-28 LAB — CONVERTED CEMR LAB: Vit D, 1,25-Dihydroxy: 45 (ref 30–89)

## 2008-08-02 ENCOUNTER — Ambulatory Visit: Payer: Self-pay | Admitting: Family Medicine

## 2008-08-02 ENCOUNTER — Ambulatory Visit (HOSPITAL_COMMUNITY): Admission: RE | Admit: 2008-08-02 | Discharge: 2008-08-02 | Payer: Self-pay | Admitting: Family Medicine

## 2008-08-09 ENCOUNTER — Ambulatory Visit: Payer: Self-pay | Admitting: Family Medicine

## 2008-08-17 ENCOUNTER — Ambulatory Visit: Payer: Self-pay | Admitting: Family Medicine

## 2008-08-24 ENCOUNTER — Ambulatory Visit: Payer: Self-pay | Admitting: Family Medicine

## 2008-08-27 ENCOUNTER — Encounter: Payer: Self-pay | Admitting: Family Medicine

## 2008-08-31 ENCOUNTER — Ambulatory Visit: Payer: Self-pay | Admitting: Family Medicine

## 2008-09-01 ENCOUNTER — Telehealth: Payer: Self-pay | Admitting: Family Medicine

## 2008-09-06 ENCOUNTER — Telehealth: Payer: Self-pay | Admitting: Gastroenterology

## 2008-09-07 ENCOUNTER — Ambulatory Visit: Payer: Self-pay | Admitting: Family Medicine

## 2008-09-14 ENCOUNTER — Ambulatory Visit: Payer: Self-pay | Admitting: Family Medicine

## 2008-09-22 ENCOUNTER — Telehealth: Payer: Self-pay | Admitting: Internal Medicine

## 2008-09-23 ENCOUNTER — Ambulatory Visit: Payer: Self-pay | Admitting: Family Medicine

## 2008-09-23 DIAGNOSIS — L259 Unspecified contact dermatitis, unspecified cause: Secondary | ICD-10-CM | POA: Insufficient documentation

## 2008-10-01 ENCOUNTER — Ambulatory Visit: Payer: Self-pay | Admitting: Family Medicine

## 2008-10-04 ENCOUNTER — Ambulatory Visit: Payer: Self-pay | Admitting: Internal Medicine

## 2008-10-05 DIAGNOSIS — R159 Full incontinence of feces: Secondary | ICD-10-CM | POA: Insufficient documentation

## 2008-10-05 DIAGNOSIS — K589 Irritable bowel syndrome without diarrhea: Secondary | ICD-10-CM

## 2008-10-12 ENCOUNTER — Encounter (INDEPENDENT_AMBULATORY_CARE_PROVIDER_SITE_OTHER): Payer: Self-pay | Admitting: *Deleted

## 2008-10-13 ENCOUNTER — Ambulatory Visit: Payer: Self-pay | Admitting: Family Medicine

## 2008-10-25 ENCOUNTER — Telehealth: Payer: Self-pay | Admitting: Family Medicine

## 2008-10-25 ENCOUNTER — Ambulatory Visit: Payer: Self-pay | Admitting: Family Medicine

## 2008-10-26 ENCOUNTER — Encounter: Payer: Self-pay | Admitting: Family Medicine

## 2008-11-08 ENCOUNTER — Ambulatory Visit: Payer: Self-pay | Admitting: Family Medicine

## 2008-11-12 ENCOUNTER — Emergency Department (HOSPITAL_COMMUNITY): Admission: EM | Admit: 2008-11-12 | Discharge: 2008-11-12 | Payer: Self-pay | Admitting: Emergency Medicine

## 2008-11-15 ENCOUNTER — Ambulatory Visit: Payer: Self-pay | Admitting: Family Medicine

## 2008-11-22 ENCOUNTER — Ambulatory Visit: Payer: Self-pay | Admitting: Family Medicine

## 2008-11-23 ENCOUNTER — Ambulatory Visit: Payer: Self-pay | Admitting: Internal Medicine

## 2008-11-26 ENCOUNTER — Ambulatory Visit: Payer: Self-pay | Admitting: Family Medicine

## 2008-11-26 DIAGNOSIS — R079 Chest pain, unspecified: Secondary | ICD-10-CM | POA: Insufficient documentation

## 2008-11-26 DIAGNOSIS — F0390 Unspecified dementia without behavioral disturbance: Secondary | ICD-10-CM | POA: Insufficient documentation

## 2008-11-26 DIAGNOSIS — R5383 Other fatigue: Secondary | ICD-10-CM

## 2008-11-26 DIAGNOSIS — R5381 Other malaise: Secondary | ICD-10-CM | POA: Insufficient documentation

## 2008-11-27 LAB — CONVERTED CEMR LAB
ALT: 16 units/L (ref 0–35)
BUN: 11 mg/dL (ref 6–23)
Basophils Absolute: 0 10*3/uL (ref 0.0–0.1)
Cholesterol: 152 mg/dL (ref 0–200)
Eosinophils Relative: 1 % (ref 0–5)
Lymphocytes Relative: 53 % — ABNORMAL HIGH (ref 12–46)
Neutro Abs: 1.3 10*3/uL — ABNORMAL LOW (ref 1.7–7.7)
Neutrophils Relative %: 36 % — ABNORMAL LOW (ref 43–77)
Platelets: 139 10*3/uL — ABNORMAL LOW (ref 150–400)
Potassium: 4 meq/L (ref 3.5–5.3)
RDW: 12.8 % (ref 11.5–15.5)
Sodium: 139 meq/L (ref 135–145)
Total Protein: 8 g/dL (ref 6.0–8.3)
Triglycerides: 36 mg/dL (ref <150)
VLDL: 7 mg/dL (ref 0–40)

## 2008-12-03 ENCOUNTER — Ambulatory Visit: Payer: Self-pay | Admitting: Family Medicine

## 2008-12-07 ENCOUNTER — Encounter: Payer: Self-pay | Admitting: Family Medicine

## 2008-12-10 ENCOUNTER — Ambulatory Visit: Payer: Self-pay | Admitting: Family Medicine

## 2008-12-10 ENCOUNTER — Telehealth: Payer: Self-pay | Admitting: Family Medicine

## 2008-12-17 ENCOUNTER — Ambulatory Visit: Payer: Self-pay | Admitting: Family Medicine

## 2008-12-17 ENCOUNTER — Telehealth: Payer: Self-pay | Admitting: Family Medicine

## 2008-12-23 ENCOUNTER — Ambulatory Visit: Payer: Self-pay | Admitting: Family Medicine

## 2008-12-27 ENCOUNTER — Telehealth: Payer: Self-pay | Admitting: Family Medicine

## 2008-12-30 ENCOUNTER — Ambulatory Visit: Payer: Self-pay | Admitting: Family Medicine

## 2009-01-11 ENCOUNTER — Ambulatory Visit: Payer: Self-pay | Admitting: Internal Medicine

## 2009-01-11 ENCOUNTER — Telehealth: Payer: Self-pay | Admitting: Family Medicine

## 2009-01-12 ENCOUNTER — Ambulatory Visit: Payer: Self-pay | Admitting: Family Medicine

## 2009-01-20 ENCOUNTER — Ambulatory Visit: Payer: Self-pay | Admitting: Family Medicine

## 2009-01-27 ENCOUNTER — Ambulatory Visit: Payer: Self-pay | Admitting: Family Medicine

## 2009-01-31 ENCOUNTER — Emergency Department (HOSPITAL_COMMUNITY): Admission: EM | Admit: 2009-01-31 | Discharge: 2009-01-31 | Payer: Self-pay | Admitting: Emergency Medicine

## 2009-02-01 ENCOUNTER — Ambulatory Visit: Payer: Self-pay | Admitting: Family Medicine

## 2009-02-03 ENCOUNTER — Telehealth: Payer: Self-pay | Admitting: Family Medicine

## 2009-02-03 ENCOUNTER — Ambulatory Visit: Payer: Self-pay | Admitting: Family Medicine

## 2009-02-10 ENCOUNTER — Ambulatory Visit: Payer: Self-pay | Admitting: Family Medicine

## 2009-02-17 ENCOUNTER — Ambulatory Visit: Payer: Self-pay | Admitting: Family Medicine

## 2009-03-03 ENCOUNTER — Ambulatory Visit: Payer: Self-pay | Admitting: Family Medicine

## 2009-03-07 ENCOUNTER — Encounter: Payer: Self-pay | Admitting: Family Medicine

## 2009-03-10 ENCOUNTER — Ambulatory Visit: Payer: Self-pay | Admitting: Family Medicine

## 2009-03-10 ENCOUNTER — Telehealth (INDEPENDENT_AMBULATORY_CARE_PROVIDER_SITE_OTHER): Payer: Self-pay | Admitting: *Deleted

## 2009-03-11 ENCOUNTER — Encounter: Payer: Self-pay | Admitting: Internal Medicine

## 2009-03-17 ENCOUNTER — Telehealth: Payer: Self-pay | Admitting: Family Medicine

## 2009-03-17 ENCOUNTER — Ambulatory Visit: Payer: Self-pay | Admitting: Family Medicine

## 2009-03-18 ENCOUNTER — Telehealth: Payer: Self-pay | Admitting: Family Medicine

## 2009-03-23 ENCOUNTER — Encounter: Payer: Self-pay | Admitting: Internal Medicine

## 2009-03-24 ENCOUNTER — Ambulatory Visit: Payer: Self-pay | Admitting: Family Medicine

## 2009-03-25 ENCOUNTER — Ambulatory Visit: Payer: Self-pay | Admitting: Internal Medicine

## 2009-03-25 DIAGNOSIS — R002 Palpitations: Secondary | ICD-10-CM | POA: Insufficient documentation

## 2009-03-29 ENCOUNTER — Ambulatory Visit: Payer: Self-pay | Admitting: Family Medicine

## 2009-03-29 DIAGNOSIS — G459 Transient cerebral ischemic attack, unspecified: Secondary | ICD-10-CM | POA: Insufficient documentation

## 2009-03-30 ENCOUNTER — Emergency Department (HOSPITAL_COMMUNITY): Admission: EM | Admit: 2009-03-30 | Discharge: 2009-03-30 | Payer: Self-pay | Admitting: Emergency Medicine

## 2009-03-31 ENCOUNTER — Ambulatory Visit (HOSPITAL_COMMUNITY): Admission: RE | Admit: 2009-03-31 | Discharge: 2009-03-31 | Payer: Self-pay | Admitting: Family Medicine

## 2009-04-01 LAB — CONVERTED CEMR LAB
Alkaline Phosphatase: 28 units/L — ABNORMAL LOW (ref 39–117)
Indirect Bilirubin: 0.5 mg/dL (ref 0.0–0.9)
LDL Cholesterol: 63 mg/dL (ref 0–99)
Total Bilirubin: 0.6 mg/dL (ref 0.3–1.2)
Total Protein: 7.9 g/dL (ref 6.0–8.3)
Triglycerides: 41 mg/dL (ref <150)

## 2009-04-07 ENCOUNTER — Ambulatory Visit: Payer: Self-pay | Admitting: Family Medicine

## 2009-04-11 ENCOUNTER — Ambulatory Visit: Payer: Self-pay | Admitting: Family Medicine

## 2009-04-13 ENCOUNTER — Encounter: Payer: Self-pay | Admitting: Family Medicine

## 2009-04-14 ENCOUNTER — Ambulatory Visit: Payer: Self-pay | Admitting: Family Medicine

## 2009-04-15 ENCOUNTER — Encounter: Payer: Self-pay | Admitting: Family Medicine

## 2009-04-19 ENCOUNTER — Ambulatory Visit (HOSPITAL_COMMUNITY): Payer: Self-pay | Admitting: Psychology

## 2009-04-21 ENCOUNTER — Ambulatory Visit: Payer: Self-pay | Admitting: Family Medicine

## 2009-04-25 ENCOUNTER — Ambulatory Visit (HOSPITAL_COMMUNITY): Admission: RE | Admit: 2009-04-25 | Discharge: 2009-04-25 | Payer: Self-pay | Admitting: Family Medicine

## 2009-04-28 ENCOUNTER — Ambulatory Visit: Payer: Self-pay | Admitting: Family Medicine

## 2009-05-04 ENCOUNTER — Ambulatory Visit (HOSPITAL_COMMUNITY): Payer: Self-pay | Admitting: Psychology

## 2009-05-09 ENCOUNTER — Encounter: Payer: Self-pay | Admitting: Family Medicine

## 2009-05-12 ENCOUNTER — Ambulatory Visit: Payer: Self-pay | Admitting: Family Medicine

## 2009-05-19 ENCOUNTER — Telehealth: Payer: Self-pay | Admitting: Family Medicine

## 2009-05-19 ENCOUNTER — Ambulatory Visit: Payer: Self-pay | Admitting: Family Medicine

## 2009-05-24 ENCOUNTER — Encounter: Payer: Self-pay | Admitting: Family Medicine

## 2009-05-26 ENCOUNTER — Ambulatory Visit: Payer: Self-pay | Admitting: Family Medicine

## 2009-05-27 ENCOUNTER — Ambulatory Visit (HOSPITAL_COMMUNITY): Payer: Self-pay | Admitting: Psychology

## 2009-06-07 ENCOUNTER — Encounter: Payer: Self-pay | Admitting: Family Medicine

## 2009-06-09 ENCOUNTER — Ambulatory Visit: Payer: Self-pay | Admitting: Family Medicine

## 2009-06-13 ENCOUNTER — Telehealth: Payer: Self-pay | Admitting: Internal Medicine

## 2009-06-15 ENCOUNTER — Ambulatory Visit: Payer: Self-pay | Admitting: Family Medicine

## 2009-06-16 LAB — CONVERTED CEMR LAB
Alkaline Phosphatase: 27 units/L — ABNORMAL LOW (ref 39–117)
BUN: 15 mg/dL (ref 6–23)
Bilirubin, Direct: 0.2 mg/dL (ref 0.0–0.3)
CO2: 27 meq/L (ref 19–32)
Chloride: 103 meq/L (ref 96–112)
Creatinine, Ser: 0.9 mg/dL (ref 0.40–1.20)
Glucose, Bld: 89 mg/dL (ref 70–99)
Indirect Bilirubin: 0.3 mg/dL (ref 0.0–0.9)
LDL Cholesterol: 82 mg/dL (ref 0–99)
Total Bilirubin: 0.5 mg/dL (ref 0.3–1.2)

## 2009-06-20 ENCOUNTER — Telehealth (INDEPENDENT_AMBULATORY_CARE_PROVIDER_SITE_OTHER): Payer: Self-pay | Admitting: *Deleted

## 2009-06-22 ENCOUNTER — Ambulatory Visit: Payer: Self-pay | Admitting: Family Medicine

## 2009-06-27 ENCOUNTER — Telehealth: Payer: Self-pay | Admitting: Internal Medicine

## 2009-06-29 ENCOUNTER — Ambulatory Visit: Payer: Self-pay | Admitting: Family Medicine

## 2009-06-30 ENCOUNTER — Ambulatory Visit: Payer: Self-pay | Admitting: Internal Medicine

## 2009-07-04 ENCOUNTER — Telehealth: Payer: Self-pay | Admitting: Internal Medicine

## 2009-07-06 ENCOUNTER — Ambulatory Visit: Payer: Self-pay | Admitting: Family Medicine

## 2009-07-12 ENCOUNTER — Encounter: Payer: Self-pay | Admitting: Family Medicine

## 2009-07-13 ENCOUNTER — Ambulatory Visit: Payer: Self-pay | Admitting: Family Medicine

## 2009-07-21 ENCOUNTER — Ambulatory Visit: Payer: Self-pay | Admitting: Family Medicine

## 2009-07-27 ENCOUNTER — Ambulatory Visit: Payer: Self-pay | Admitting: Internal Medicine

## 2009-07-27 DIAGNOSIS — K649 Unspecified hemorrhoids: Secondary | ICD-10-CM

## 2009-07-28 ENCOUNTER — Ambulatory Visit (HOSPITAL_COMMUNITY): Payer: Self-pay | Admitting: Psychology

## 2009-07-28 ENCOUNTER — Ambulatory Visit: Payer: Self-pay | Admitting: Family Medicine

## 2009-08-03 ENCOUNTER — Telehealth: Payer: Self-pay | Admitting: Internal Medicine

## 2009-08-03 ENCOUNTER — Telehealth: Payer: Self-pay | Admitting: Family Medicine

## 2009-08-04 ENCOUNTER — Ambulatory Visit: Payer: Self-pay | Admitting: Family Medicine

## 2009-08-05 ENCOUNTER — Ambulatory Visit (HOSPITAL_COMMUNITY): Admission: RE | Admit: 2009-08-05 | Discharge: 2009-08-05 | Payer: Self-pay | Admitting: Family Medicine

## 2009-08-10 ENCOUNTER — Telehealth: Payer: Self-pay | Admitting: Internal Medicine

## 2009-08-11 ENCOUNTER — Ambulatory Visit: Payer: Self-pay | Admitting: Family Medicine

## 2009-08-12 ENCOUNTER — Telehealth: Payer: Self-pay | Admitting: Internal Medicine

## 2009-08-16 ENCOUNTER — Ambulatory Visit: Payer: Self-pay | Admitting: Family Medicine

## 2009-08-18 ENCOUNTER — Ambulatory Visit: Payer: Self-pay | Admitting: Family Medicine

## 2009-08-25 ENCOUNTER — Ambulatory Visit: Payer: Self-pay | Admitting: Family Medicine

## 2009-08-29 ENCOUNTER — Telehealth: Payer: Self-pay | Admitting: Family Medicine

## 2009-08-29 ENCOUNTER — Ambulatory Visit (HOSPITAL_COMMUNITY): Payer: Self-pay | Admitting: Psychology

## 2009-08-31 ENCOUNTER — Ambulatory Visit: Payer: Self-pay | Admitting: Family Medicine

## 2009-09-08 ENCOUNTER — Ambulatory Visit: Payer: Self-pay | Admitting: Family Medicine

## 2009-09-15 ENCOUNTER — Ambulatory Visit: Payer: Self-pay | Admitting: Family Medicine

## 2009-09-20 ENCOUNTER — Encounter: Payer: Self-pay | Admitting: Family Medicine

## 2009-09-22 ENCOUNTER — Ambulatory Visit: Payer: Self-pay | Admitting: Family Medicine

## 2009-09-29 ENCOUNTER — Ambulatory Visit: Payer: Self-pay | Admitting: Family Medicine

## 2009-10-06 ENCOUNTER — Ambulatory Visit: Payer: Self-pay | Admitting: Family Medicine

## 2009-10-11 ENCOUNTER — Telehealth: Payer: Self-pay | Admitting: Family Medicine

## 2009-10-12 ENCOUNTER — Ambulatory Visit: Payer: Self-pay | Admitting: Family Medicine

## 2009-10-14 ENCOUNTER — Telehealth: Payer: Self-pay | Admitting: Family Medicine

## 2009-10-20 ENCOUNTER — Ambulatory Visit: Payer: Self-pay | Admitting: Family Medicine

## 2009-10-27 ENCOUNTER — Ambulatory Visit: Payer: Self-pay | Admitting: Internal Medicine

## 2009-10-27 ENCOUNTER — Ambulatory Visit: Payer: Self-pay | Admitting: Family Medicine

## 2009-10-31 ENCOUNTER — Telehealth: Payer: Self-pay | Admitting: Family Medicine

## 2009-11-03 ENCOUNTER — Ambulatory Visit: Payer: Self-pay | Admitting: Family Medicine

## 2009-11-07 LAB — CONVERTED CEMR LAB
BUN: 16 mg/dL (ref 6–23)
Bilirubin, Direct: 0.1 mg/dL (ref 0.0–0.3)
Chloride: 101 meq/L (ref 96–112)
Glucose, Bld: 82 mg/dL (ref 70–99)
Indirect Bilirubin: 0.4 mg/dL (ref 0.0–0.9)
LDL Cholesterol: 79 mg/dL (ref 0–99)
Potassium: 4 meq/L (ref 3.5–5.3)
TSH: 4.042 microintl units/mL (ref 0.350–4.500)
Total CHOL/HDL Ratio: 2.1
Total Protein: 7.7 g/dL (ref 6.0–8.3)
Triglycerides: 51 mg/dL (ref <150)
VLDL: 10 mg/dL (ref 0–40)

## 2009-11-09 ENCOUNTER — Ambulatory Visit: Payer: Self-pay | Admitting: Family Medicine

## 2009-11-10 LAB — CONVERTED CEMR LAB: Vit D, 25-Hydroxy: 42 ng/mL (ref 30–89)

## 2009-11-17 ENCOUNTER — Ambulatory Visit: Payer: Self-pay | Admitting: Family Medicine

## 2009-11-23 ENCOUNTER — Ambulatory Visit: Payer: Self-pay | Admitting: Family Medicine

## 2009-11-25 ENCOUNTER — Ambulatory Visit: Payer: Self-pay | Admitting: Internal Medicine

## 2009-11-25 ENCOUNTER — Emergency Department (HOSPITAL_COMMUNITY): Admission: EM | Admit: 2009-11-25 | Discharge: 2009-11-26 | Payer: Self-pay | Admitting: Emergency Medicine

## 2009-11-27 ENCOUNTER — Encounter: Payer: Self-pay | Admitting: Internal Medicine

## 2009-11-28 ENCOUNTER — Telehealth: Payer: Self-pay | Admitting: Family Medicine

## 2009-11-28 ENCOUNTER — Ambulatory Visit: Payer: Self-pay | Admitting: Family Medicine

## 2009-11-28 DIAGNOSIS — H81399 Other peripheral vertigo, unspecified ear: Secondary | ICD-10-CM | POA: Insufficient documentation

## 2009-11-30 ENCOUNTER — Telehealth: Payer: Self-pay | Admitting: Internal Medicine

## 2009-12-02 ENCOUNTER — Ambulatory Visit: Payer: Self-pay | Admitting: Family Medicine

## 2009-12-04 DIAGNOSIS — F5105 Insomnia due to other mental disorder: Secondary | ICD-10-CM

## 2009-12-04 DIAGNOSIS — F419 Anxiety disorder, unspecified: Secondary | ICD-10-CM

## 2009-12-07 ENCOUNTER — Ambulatory Visit: Payer: Self-pay | Admitting: Family Medicine

## 2009-12-07 ENCOUNTER — Ambulatory Visit (HOSPITAL_COMMUNITY): Payer: Self-pay | Admitting: Psychology

## 2009-12-08 ENCOUNTER — Encounter: Payer: Self-pay | Admitting: Family Medicine

## 2009-12-12 ENCOUNTER — Telehealth: Payer: Self-pay | Admitting: Family Medicine

## 2009-12-15 ENCOUNTER — Ambulatory Visit: Payer: Self-pay | Admitting: Family Medicine

## 2009-12-15 ENCOUNTER — Ambulatory Visit: Payer: Self-pay | Admitting: Otolaryngology

## 2009-12-16 ENCOUNTER — Emergency Department (HOSPITAL_COMMUNITY): Admission: EM | Admit: 2009-12-16 | Discharge: 2009-12-16 | Payer: Self-pay | Admitting: Emergency Medicine

## 2009-12-22 ENCOUNTER — Telehealth: Payer: Self-pay | Admitting: Family Medicine

## 2009-12-22 ENCOUNTER — Ambulatory Visit: Payer: Self-pay | Admitting: Family Medicine

## 2009-12-29 ENCOUNTER — Ambulatory Visit: Payer: Self-pay | Admitting: Family Medicine

## 2009-12-29 ENCOUNTER — Ambulatory Visit: Payer: Self-pay | Admitting: Internal Medicine

## 2010-01-05 ENCOUNTER — Ambulatory Visit: Payer: Self-pay | Admitting: Family Medicine

## 2010-01-05 ENCOUNTER — Ambulatory Visit: Payer: Self-pay | Admitting: Otolaryngology

## 2010-01-13 ENCOUNTER — Ambulatory Visit: Payer: Self-pay | Admitting: Family Medicine

## 2010-01-19 ENCOUNTER — Ambulatory Visit: Payer: Self-pay | Admitting: Family Medicine

## 2010-01-26 ENCOUNTER — Ambulatory Visit: Payer: Self-pay | Admitting: Family Medicine

## 2010-02-02 ENCOUNTER — Ambulatory Visit: Payer: Self-pay | Admitting: Family Medicine

## 2010-02-09 ENCOUNTER — Ambulatory Visit: Payer: Self-pay | Admitting: Family Medicine

## 2010-02-10 ENCOUNTER — Encounter: Payer: Self-pay | Admitting: Family Medicine

## 2010-02-16 ENCOUNTER — Ambulatory Visit: Payer: Self-pay | Admitting: Family Medicine

## 2010-02-17 ENCOUNTER — Ambulatory Visit: Payer: Self-pay | Admitting: Internal Medicine

## 2010-03-02 ENCOUNTER — Ambulatory Visit: Payer: Self-pay | Admitting: Family Medicine

## 2010-03-09 ENCOUNTER — Ambulatory Visit: Payer: Self-pay | Admitting: Family Medicine

## 2010-03-23 ENCOUNTER — Ambulatory Visit: Payer: Self-pay | Admitting: Family Medicine

## 2010-03-30 ENCOUNTER — Ambulatory Visit: Payer: Self-pay | Admitting: Family Medicine

## 2010-04-07 ENCOUNTER — Ambulatory Visit: Payer: Self-pay | Admitting: Family Medicine

## 2010-04-13 ENCOUNTER — Ambulatory Visit: Payer: Self-pay | Admitting: Family Medicine

## 2010-04-25 ENCOUNTER — Ambulatory Visit: Payer: Self-pay | Admitting: Family Medicine

## 2010-05-02 ENCOUNTER — Ambulatory Visit: Payer: Self-pay | Admitting: Family Medicine

## 2010-05-18 ENCOUNTER — Telehealth (INDEPENDENT_AMBULATORY_CARE_PROVIDER_SITE_OTHER): Payer: Self-pay | Admitting: *Deleted

## 2010-05-22 ENCOUNTER — Ambulatory Visit: Payer: Self-pay | Admitting: Family Medicine

## 2010-05-29 ENCOUNTER — Telehealth: Payer: Self-pay | Admitting: Family Medicine

## 2010-05-30 ENCOUNTER — Ambulatory Visit: Payer: Self-pay | Admitting: Family Medicine

## 2010-06-08 ENCOUNTER — Ambulatory Visit (HOSPITAL_COMMUNITY)
Admission: RE | Admit: 2010-06-08 | Discharge: 2010-06-08 | Payer: Self-pay | Source: Home / Self Care | Admitting: Family Medicine

## 2010-06-08 ENCOUNTER — Ambulatory Visit (HOSPITAL_COMMUNITY): Payer: Self-pay | Admitting: Psychology

## 2010-06-08 ENCOUNTER — Ambulatory Visit: Payer: Self-pay | Admitting: Family Medicine

## 2010-06-08 DIAGNOSIS — M25559 Pain in unspecified hip: Secondary | ICD-10-CM

## 2010-06-15 ENCOUNTER — Ambulatory Visit: Payer: Self-pay | Admitting: Family Medicine

## 2010-06-19 ENCOUNTER — Ambulatory Visit: Payer: Self-pay | Admitting: Internal Medicine

## 2010-06-21 ENCOUNTER — Ambulatory Visit: Payer: Self-pay | Admitting: Family Medicine

## 2010-06-24 LAB — CONVERTED CEMR LAB
Albumin: 4.1 g/dL (ref 3.5–5.2)
Basophils Absolute: 0 10*3/uL (ref 0.0–0.1)
Bilirubin, Direct: 0.2 mg/dL (ref 0.0–0.3)
CO2: 29 meq/L (ref 19–32)
Calcium: 9.3 mg/dL (ref 8.4–10.5)
Chloride: 104 meq/L (ref 96–112)
Eosinophils Relative: 2 % (ref 0–5)
Glucose, Bld: 89 mg/dL (ref 70–99)
HCT: 37.4 % (ref 36.0–46.0)
HDL: 79 mg/dL (ref >39)
LDL Cholesterol: 76 mg/dL (ref 0–99)
Lymphocytes Relative: 49 % — ABNORMAL HIGH (ref 12–46)
Lymphs Abs: 1.8 10*3/uL (ref 0.7–4.0)
Neutro Abs: 1.4 10*3/uL — ABNORMAL LOW (ref 1.7–7.7)
Platelets: 119 10*3/uL — ABNORMAL LOW (ref 150–400)
Potassium: 4.1 meq/L (ref 3.5–5.3)
Sodium: 141 meq/L (ref 135–145)
Total Bilirubin: 0.5 mg/dL (ref 0.3–1.2)
Total CHOL/HDL Ratio: 2.1
VLDL: 8 mg/dL (ref 0–40)
WBC: 3.7 10*3/uL — ABNORMAL LOW (ref 4.0–10.5)

## 2010-06-29 ENCOUNTER — Ambulatory Visit: Payer: Self-pay | Admitting: Family Medicine

## 2010-06-30 ENCOUNTER — Telehealth: Payer: Self-pay | Admitting: Family Medicine

## 2010-07-18 ENCOUNTER — Ambulatory Visit
Admission: RE | Admit: 2010-07-18 | Discharge: 2010-07-18 | Payer: Self-pay | Source: Home / Self Care | Attending: Family Medicine | Admitting: Family Medicine

## 2010-07-26 ENCOUNTER — Ambulatory Visit
Admission: RE | Admit: 2010-07-26 | Discharge: 2010-07-26 | Payer: Self-pay | Source: Home / Self Care | Attending: Family Medicine | Admitting: Family Medicine

## 2010-07-28 ENCOUNTER — Ambulatory Visit: Payer: Self-pay | Admitting: Internal Medicine

## 2010-07-29 ENCOUNTER — Encounter: Payer: Self-pay | Admitting: Family Medicine

## 2010-07-30 ENCOUNTER — Encounter: Payer: Self-pay | Admitting: Neurology

## 2010-07-30 ENCOUNTER — Encounter: Payer: Self-pay | Admitting: Family Medicine

## 2010-08-03 ENCOUNTER — Ambulatory Visit
Admission: RE | Admit: 2010-08-03 | Discharge: 2010-08-03 | Payer: Self-pay | Source: Home / Self Care | Attending: Family Medicine | Admitting: Family Medicine

## 2010-08-07 ENCOUNTER — Ambulatory Visit (HOSPITAL_COMMUNITY)
Admission: RE | Admit: 2010-08-07 | Discharge: 2010-08-07 | Payer: Self-pay | Source: Home / Self Care | Attending: Family Medicine | Admitting: Family Medicine

## 2010-08-08 NOTE — Letter (Signed)
Summary: phone note  phone note   Imported By: Curtis Sites 12/09/2009 11:42:33  _____________________________________________________________________  External Attachment:    Type:   Image     Comment:   External Document

## 2010-08-08 NOTE — Progress Notes (Signed)
Summary: allergy shot questions  Phone Note Call from Patient Call back at Home Phone [phone]   Caller: Patient Call For: young Summary of Call: Has questions about her allergy shots. Initial call taken by: Darletta Moll,  May 18, 2010 1:47 PM  Follow-up for Phone Call        Pt states that in the past when she has missed her weekly allergy shots Dr. Maple Hudson told her to " back up her dose" when she restarted. Pt last had vaccine on 05-02-10 and wants to know what dose should she start back on this week? Please advsie. Carron Curie CMA  May 18, 2010 2:08 PM   Additional Follow-up for Phone Call Additional follow up Details #1::        Florentina Addison- please have her take her usual dose.  Additional Follow-up by: Waymon Budge MD,  May 19, 2010 1:32 PM    Additional Follow-up for Phone Call Additional follow up Details #2::    LMTCB-to let patient know to take her normal dose.Reynaldo Minium CMA  May 19, 2010 2:33 PM    Spoke with patient-states her medical provider told her to cut back by 0.2 this dose-pt did so; I told patient to go back to normal dose at her next injection. If any questions or concerns to call the allergy lab. Also, pt states she is having some hoarseness and strachy throat-requests to be seen before 12-11 appt; pt to check with transportation and call me back and schedule a time to be seen.Reynaldo Minium CMA  May 22, 2010 12:00 PM

## 2010-08-08 NOTE — Letter (Signed)
Summary: consults  consults   Imported By: Curtis Sites 12/09/2009 11:34:51  _____________________________________________________________________  External Attachment:    Type:   Image     Comment:   External Document

## 2010-08-08 NOTE — Assessment & Plan Note (Signed)
Summary: inj  Nurse Visit   Vital Signs:  Patient profile:   77 year old female Menstrual status:  hysterectomy Height:      65 inches Weight:      126.25 pounds BMI:     21.09 O2 Sat:      96 % on Room air Pulse rate:   74 / minute Resp:     16 per minute BP sitting:   120 / 80  (left arm) Cuff size:   regular  Vitals Entered By: Everitt Amber LPN (October 20, 2009 3:04 PM)  O2 Flow:  Room air Comments Patient in to get allergy injection   Allergies: 1)  ! Benadryl  Orders Added: 1)  Admin of patients own med IM/SQ [[id number]]   Orders Added: 1)  Admin of patients own med IM/SQ [facility]

## 2010-08-08 NOTE — Progress Notes (Signed)
Summary: MEDICINE  Phone Note Call from Patient   Summary of Call: EXLEON PATCHES HAVE NO REFILLS AND WANTS TO KNOW CAN SHE TAKE A BREAK FROM THESE  CALL HER BACK AND LET HER KNOW Initial call taken by: Lind Guest,  August 29, 2009 11:12 AM  Follow-up for Phone Call        advise needs to continueto keep her memory good, pls refill x 6 Follow-up by: [name] MD,  August 29, 2009 12:28 PM  Additional Follow-up for Phone Call Additional follow up Details #1::        patient aware Additional Follow-up by: Adella Hare LPN,  August 29, 2009 3:56 PM

## 2010-08-08 NOTE — Assessment & Plan Note (Signed)
Summary: inj  Nurse Visit   Vital Signs:  Patient profile:   77 year old female Menstrual status:  hysterectomy Height:      65 inches Weight:      126.25 pounds BMI:     21.09 O2 Sat:      95 % Pulse rate:   74 / minute Resp:     16 per minute  Vitals Entered By: Everitt Amber LPN (October 27, 2009 2:35 PM) Comments Patient to get her weekly allergy injection   Allergies: 1)  ! Benadryl  Orders Added: 1)  Admin of patients own med IM/SQ [[id number]]   Orders Added: 1)  Admin of patients own med IM/SQ [facility]

## 2010-08-08 NOTE — Assessment & Plan Note (Signed)
Summary: office visit   Vital Signs:  Patient profile:   77 year old female Menstrual status:  hysterectomy Height:      65 inches Weight:      127 pounds BMI:     21.21 O2 Sat:      97 % Pulse rate:   66 / minute Pulse rhythm:   regular Resp:     15 per minute BP sitting:   110 / 70  (left arm) Cuff size:   regular  Vitals Entered By: Everitt Amber LPN (September 22, 2009 10:32 AM) CC: Follow up chronic problems    Primary Care Provider:  [name], MD   CC:  Follow up chronic problems .  History of Present Illness: Pt in reporting much improvement  in he anxiety since starting buspar, she denies any adverse side effects and plans to continue this. She states that her sinus allergiesare much improved since using the nasal wash recommended by eNT. She denies urinary symptoms.    Current Medications (verified): 1)  Advair Diskus 250-50 Mcg/dose Misc (Fluticasone-Salmeterol) .Marland Kitchen.. 1puff and Rinse, Twice Daily 2)  Allergy Vaccine 1-10 Gets At Pcp (W-E) 3)  Epipen 0.3 Mg/0.16ml Devi (Epinephrine) .... For Severe Allergic Reaction 4)  Crestor 20 Mg  Tabs (Rosuvastatin Calcium) .... Take 1 Tablet By Mouth Every Other Night At Bedtime 5)  Metoprolol Succinate 25 Mg  Tb24 (Metoprolol Succinate) .... 1/2 Tab Two Times A Day 6)  Bayer Aspirin 325 Mg Tabs (Aspirin) .... Take 1 Tablet By Mouth Once A Day 7)  Citracal Maximum 315-250 Mg-Unit Tabs (Calcium Citrate-Vitamin D) .... Take 2 Tablets Daily 8)  Nexium 40 Mg Cpdr (Esomeprazole Magnesium) .... One Cap By Mouth Qd 9)  Miralax  Powd (Polyethylene Glycol 3350) .... Take 1/2 Dose Daily As Needed 10)  Alendronate Sodium 70 Mg Tabs (Alendronate Sodium) .... Take One Tab Once Weekly 11)  Clorazepate Dipotassium 7.5 Mg Tabs (Clorazepate Dipotassium) .... One Half Tab in Am and One Tab Qhs 12)  Align  Caps (Probiotic Product) .... One Tablet By Mouth Daily 13)  Exelon 9.5 Mg/24hr Pt24 (Rivastigmine) .... One Patch Qd 14)  Refresh Dry Eye  Therapy 1-1 % Soln (Glycerin-Polysorbate 80) .... 2 Drops in Each Eye Once Daily 15)  Benefiber  Pack (Guar Gum) .... Take 2 Teaspoons By Mouth Once Dailay Dissolved in At Least 8 Ounces Water/juice 16)  Buspar 5 Mg Tabs (Buspirone Hcl) .... Take 1 Tablet By Mouth Two Times A Day  Allergies (verified): 1)  ! Benadryl  Review of Systems      See HPI General:  Denies chills and fever. Eyes:  Denies discharge and red eye. ENT:  Denies earache, hoarseness, nasal congestion, and sinus pressure. CV:  Denies chest pain or discomfort, palpitations, and swelling of feet. Resp:  Denies cough, sputum productive, and wheezing. GI:  Denies abdominal pain, constipation, diarrhea, nausea, and vomiting. GU:  Denies dysuria and urinary frequency. MS:  Denies joint pain and stiffness. Derm:  Complains of itching. Neuro:  Denies headaches. Psych:  Complains of anxiety; denies panic attacks, suicidal thoughts/plans, thoughts of violence, and unusual visions or sounds. Endo:  Denies cold intolerance, excessive hunger, excessive thirst, excessive urination, heat intolerance, polyuria, and weight change. Heme:  Denies abnormal bruising and skin discoloration. Allergy:  Complains of seasonal allergies.  Physical Exam  General:  Well-developed,well-nourished,in no acute distress; alert,appropriate and cooperative throughout examination HEENT: No facial asymmetry,  EOMI, No sinus tenderness, TM's Clear, oropharynx  pink and  moist.   Chest: Clear to auscultation bilaterally.  CVS: S1, S2, No murmurs, No S3.   Abd: Soft, Nontender.  MS: Adequate ROM spine, hips, shoulders and knees.  Ext: No edema.   CNS: CN 2-12 intact, power tone and sensation normal throughout.   Skin: Intact, no visible lesions or rashes.  Psych: Good eye contact, normal affect.  Memory intact, not anxious or depressed appearing.     Impression & Recommendations:  Problem # 1:  GENERALIZED ANXIETY DISORDER (ICD-300.02) Assessment  Improved  Her updated medication list for this problem includes:    Clorazepate Dipotassium 7.5 Mg Tabs (Clorazepate dipotassium) ..... One half tab in am and one tab qhs    Buspar 5 Mg Tabs (Buspirone hcl) .Marland Kitchen... Take 1 tablet by mouth two times a day  Problem # 2:  PRESENILE DEMENTIA, UNCOMPLICATED (ICD-290.10) Assessment: Unchanged  Problem # 3:  GERD (ICD-530.81) Assessment: Improved  Her updated medication list for this problem includes:    Nexium 40 Mg Cpdr (Esomeprazole magnesium) ..... One cap by mouth qd  Problem # 4:  HYPERLIPIDEMIA (ICD-272.4) Assessment: Comment Only  Her updated medication list for this problem includes:    Crestor 20 Mg Tabs (Rosuvastatin calcium) .Marland Kitchen... Take 1 tablet by mouth every other night at bedtime  Orders: T-Hepatic Function [id number]) T-Lipid Profile [phone])  Labs Reviewed: SGOT: 34 (06/15/2009)   SGPT: 34 (06/15/2009)   HDL:78 (06/15/2009), 85 (03/30/2009)  LDL:82 (06/15/2009), 63 (03/30/2009)  Chol:170 (06/15/2009), 156 (03/30/2009)  Trig:49 (06/15/2009), 41 (03/30/2009)  Complete Medication List: 1)  Advair Diskus 250-50 Mcg/dose Misc (Fluticasone-salmeterol) .Marland Kitchen.. 1puff and rinse, twice daily 2)  Allergy Vaccine 1-10 Gets At Pcp (w-e)  3)  Epipen 0.3 Mg/0.45ml Devi (Epinephrine) .... For severe allergic reaction 4)  Crestor 20 Mg Tabs (Rosuvastatin calcium) .... Take 1 tablet by mouth every other night at bedtime 5)  Metoprolol Succinate 25 Mg Tb24 (Metoprolol succinate) .... 1/2 tab two times a day 6)  Bayer Aspirin 325 Mg Tabs (Aspirin) .... Take 1 tablet by mouth once a day 7)  Citracal Maximum 315-250 Mg-unit Tabs (Calcium citrate-vitamin d) .... Take 2 tablets daily 8)  Nexium 40 Mg Cpdr (Esomeprazole magnesium) .... One cap by mouth qd 9)  Miralax Powd (Polyethylene glycol 3350) .... Take 1/2 dose daily as needed 10)  Alendronate Sodium 70 Mg Tabs (Alendronate sodium) .... Take one tab once weekly 11)  Clorazepate  Dipotassium 7.5 Mg Tabs (Clorazepate dipotassium) .... One half tab in am and one tab qhs 12)  Align Caps (Probiotic product) .... One tablet by mouth daily 13)  Exelon 9.5 Mg/24hr Pt24 (Rivastigmine) .... One patch qd 14)  Refresh Dry Eye Therapy 1-1 % Soln (Glycerin-polysorbate 80) .... 2 drops in each eye once daily 15)  Benefiber Pack (Guar gum) .... Take 2 teaspoons by mouth once dailay dissolved in at least 8 ounces water/juice 16)  Buspar 5 Mg Tabs (Buspirone hcl) .... Take 1 tablet by mouth two times a day  Other Orders: T-Basic Metabolic Panel [id number]) T-TSH [id number]) Admin of patients own med IM/SQ [phone])  Patient Instructions: 1)  f/u end May or early june 2)  BMP prior to visit, ICD-9: 3)  Hepatic Panel prior to visit, ICD-9: 4)  Lipid Panel prior to visit, ICD-9:  fasting  may 222 or after. 5)  TSH prior to visit, ICD-9: 6)  i am glad that you are much better, pls keep it up.   Orders Added: 1)  Est. Patient Level IV [44010]  2)  T-Basic Metabolic Panel [[id number]] 3)  T-Hepatic Function [[id number]] 4)  T-Lipid Profile [[id number]] 5)  T-TSH [[id number]] 6)  Admin of patients own med IM/SQ [[id number]]

## 2010-08-08 NOTE — Assessment & Plan Note (Signed)
Summary: office visit   Vital Signs:  Patient profile:   77 year old female Menstrual status:  hysterectomy Height:      65 inches Weight:      129 pounds BMI:     21.54 O2 Sat:      99 % on Room air Pulse rate:   70 / minute Pulse rhythm:   regular Resp:     16 per minute BP sitting:   100 / 60  (left arm)  Vitals Entered By: Worthy Keeler LPN (August 16, 2009 9:57 AM)  O2 Flow:  Room air CC: follow-up visit Is Patient Diabetic? No Pain Assessment Patient in pain? no        Primary Care Provider:  [name], MD   CC:  follow-up visit.  History of Present Illness: recently had blood streaked nasal drainage and irritation in her nostrils, she has ENt  appt this pm. She is in with her daughter and the discussion centers around increased anxiety , behavioral and relationship problems. She continues to c/o extremely dry and flaking skin, but has not been using the cream prescribed regularly  Current Medications (verified): 1)  Advair Diskus 250-50 Mcg/dose Misc (Fluticasone-Salmeterol) .Marland Kitchen.. 1puff and Rinse, Twice Daily 2)  Allergy Vaccine 1-10 Gets At Pcp (W-E) 3)  Epipen 0.3 Mg/0.55ml Devi (Epinephrine) .... For Severe Allergic Reaction 4)  Crestor 20 Mg  Tabs (Rosuvastatin Calcium) .... Take 1 Tablet By Mouth Every Other Night At Bedtime 5)  Metoprolol Succinate 25 Mg  Tb24 (Metoprolol Succinate) .... 1/2 Tab Two Times A Day 6)  Bayer Aspirin 325 Mg Tabs (Aspirin) .... Take 1 Tablet By Mouth Once A Day 7)  Citracal Maximum 315-250 Mg-Unit Tabs (Calcium Citrate-Vitamin D) .... Take 2 Tablets Daily 8)  Nexium 40 Mg Cpdr (Esomeprazole Magnesium) .... One Cap By Mouth Qd 9)  Miralax  Powd (Polyethylene Glycol 3350) .... Take 1/2 Dose Daily As Needed 10)  Alendronate Sodium 70 Mg Tabs (Alendronate Sodium) .... Take One Tab Once Weekly 11)  Clorazepate Dipotassium 7.5 Mg Tabs (Clorazepate Dipotassium) .... One Half Tab in Am and One Tab Qhs 12)  Align  Caps (Probiotic  Product) .... One Tablet By Mouth Daily 13)  Exelon 9.5 Mg/24hr Pt24 (Rivastigmine) .... One Patch Qd 14)  Refresh Dry Eye Therapy 1-1 % Soln (Glycerin-Polysorbate 80) .... 2 Drops in Each Eye Once Daily 15)  Benefiber  Pack (Guar Gum) .... Take 2 Teaspoons By Mouth Once Dailay Dissolved in At Least 8 Ounces Water/juice  Allergies (verified): 1)  ! Benadryl  Review of Systems      See HPI General:  Denies chills, fatigue, fever, and weakness. Eyes:  Denies blurring and discharge. ENT:  Complains of nasal congestion and postnasal drainage. CV:  Denies chest pain or discomfort and palpitations. Resp:  Denies cough and sputum productive. GI:  Complains of constipation; denies abdominal pain, diarrhea, nausea, and vomiting. GU:  Denies dysuria and urinary frequency. MS:  Complains of joint pain and stiffness. Derm:  Complains of dryness and rash. Neuro:  Denies headaches, seizures, and sensation of room spinning. Psych:  Complains of anxiety and depression; denies mental problems, suicidal thoughts/plans, thoughts of violence, unusual visions or sounds, and thoughts /plans of harming others. Endo:  Denies cold intolerance, excessive hunger, excessive thirst, excessive urination, heat intolerance, polyuria, and weight change. Heme:  Denies abnormal bruising and bleeding. Allergy:  Complains of seasonal allergies and sneezing.  Physical Exam  General:  Well-developed,adequately-nourished,in no acute distress;  alert,appropriate and cooperative throughout examination HEENT: No facial asymmetry,  EOMI, No sinus tenderness, TM's Clear, oropharynx  pink and moist.   Chest: Clear to auscultation bilaterally.  CVS: S1, S2, No murmurs, No S3.   Abd: Soft, Nontender.  MS: decreased ROM spine and  shoulders and adequate in  knees.  Ext: No edema.   CNS: CN 2-12 intact, power tone and sensation normal throughout.   Skin: Intact, no visible lesions or rashes.  Psych: Good eye contact, normal  affect.  Memory  fairly intact,  anxious .    Impression & Recommendations:  Problem # 1:  GENERALIZED ANXIETY DISORDER (ICD-300.02) Assessment Deteriorated  Her updated medication list for this problem includes:    Clorazepate Dipotassium 7.5 Mg Tabs (Clorazepate dipotassium) ..... One half tab in am and one tab qhs    Buspar 5 Mg Tabs (Buspirone hcl) .Marland Kitchen... Take 1 tablet by mouth two times a day  Problem # 2:  IRRITABLE BOWEL SYNDROME (ICD-564.1) Assessment: Unchanged  Problem # 3:  CONTACT DERMATITIS&OTHER ECZEMA DUE UNSPEC CAUSE (ICD-692.9) Assessment: Unchanged  Problem # 4:  GERD (ICD-530.81) Assessment: Improved  Her updated medication list for this problem includes:    Nexium 40 Mg Cpdr (Esomeprazole magnesium) ..... One cap by mouth qd  Problem # 5:  HYPERLIPIDEMIA (ICD-272.4) Assessment: Unchanged  Her updated medication list for this problem includes:    Crestor 20 Mg Tabs (Rosuvastatin calcium) .Marland Kitchen... Take 1 tablet by mouth every other night at bedtime  Labs Reviewed: SGOT: 34 (06/15/2009)   SGPT: 34 (06/15/2009)   HDL:78 (06/15/2009), 85 (03/30/2009)  LDL:82 (06/15/2009), 63 (03/30/2009)  Chol:170 (06/15/2009), 156 (03/30/2009)  Trig:49 (06/15/2009), 41 (03/30/2009)  Complete Medication List: 1)  Advair Diskus 250-50 Mcg/dose Misc (Fluticasone-salmeterol) .Marland Kitchen.. 1puff and rinse, twice daily 2)  Allergy Vaccine 1-10 Gets At Pcp (w-e)  3)  Epipen 0.3 Mg/0.88ml Devi (Epinephrine) .... For severe allergic reaction 4)  Crestor 20 Mg Tabs (Rosuvastatin calcium) .... Take 1 tablet by mouth every other night at bedtime 5)  Metoprolol Succinate 25 Mg Tb24 (Metoprolol succinate) .... 1/2 tab two times a day 6)  Bayer Aspirin 325 Mg Tabs (Aspirin) .... Take 1 tablet by mouth once a day 7)  Citracal Maximum 315-250 Mg-unit Tabs (Calcium citrate-vitamin d) .... Take 2 tablets daily 8)  Nexium 40 Mg Cpdr (Esomeprazole magnesium) .... One cap by mouth qd 9)  Miralax Powd  (Polyethylene glycol 3350) .... Take 1/2 dose daily as needed 10)  Alendronate Sodium 70 Mg Tabs (Alendronate sodium) .... Take one tab once weekly 11)  Clorazepate Dipotassium 7.5 Mg Tabs (Clorazepate dipotassium) .... One half tab in am and one tab qhs 12)  Align Caps (Probiotic product) .... One tablet by mouth daily 13)  Exelon 9.5 Mg/24hr Pt24 (Rivastigmine) .... One patch qd 14)  Refresh Dry Eye Therapy 1-1 % Soln (Glycerin-polysorbate 80) .... 2 drops in each eye once daily 15)  Benefiber Pack (Guar gum) .... Take 2 teaspoons by mouth once dailay dissolved in at least 8 ounces water/juice 16)  Buspar 5 Mg Tabs (Buspirone hcl) .... Take 1 tablet by mouth two times a day  Patient Instructions: 1)  You  need to return in 5 weeks. 2)  eNT will treat the sinus and nose problem. 3)  pls use the same cream on your arms for the dry skin. 4)  new med for anxiety. Prescriptions: CLORAZEPATE DIPOTASSIUM 7.5 MG TABS (CLORAZEPATE DIPOTASSIUM) one half tab in am and one tab qhs  #  45 x 3   Entered by:   Adella Hare LPN   Authorized by:   [name] MD   Signed by:   Adella Hare LPN on 07/17/3233   Method used:   Printed then faxed to ...       [address] [phone]* (retail)       [address]       [place], [place]  [address]       Ph: [phone] or [phone]       Fax: [phone]   RxID:   [id number] EXELON 9.5 MG/24HR PT24 (RIVASTIGMINE) one patch qd  #30 x 5   Entered by:   Adella Hare LPN   Authorized by:   [name] MD   Signed by:   Adella Hare LPN on 99/37/1696   Method used:   Printed then faxed to ...       [address] [phone]* (retail)       [address]       [place], [place]  [address]       Ph: [phone] or [phone]       Fax: [phone]   RxID:   [id number] BUSPAR 5 MG TABS (BUSPIRONE HCL) Take 1 tablet by mouth two times a day  #60 x 3   Entered and Authorized by:   [name] MD   Signed  by:   [name] MD on 08/16/2009   Method used:   Electronically to        [facility] [phone]* (retail)       [address]       [place], [place]  [address]       Ph: [phone] or [phone]       Fax: [phone]   RxID:   [id number]

## 2010-08-08 NOTE — Progress Notes (Signed)
  Phone Note Call from Patient   Caller: Patient Summary of Call: left ankle swells up from time to time, does not hurt, looks scaley   wants to know does she need to be seen for this? Initial call taken by: Adella Hare LPN,  October 31, 2009 4:51 PM  Follow-up for Phone Call        no, icde the ankle when swollen and use moisturising cream on scakly skin Follow-up by: [name] MD,  October 31, 2009 5:21 PM  Additional Follow-up for Phone Call Additional follow up Details #1::        patient aware Additional Follow-up by: Adella Hare LPN,  November 01, 2009 9:04 AM

## 2010-08-08 NOTE — Progress Notes (Signed)
Summary: refill  Phone Note Call from Patient   Summary of Call: pt has a question about her meds and how to take meds. [phone] Initial call taken by: Rudene Anda,  October 14, 2009 10:11 AM  Follow-up for Phone Call        patient wanted to know if she can take nexium with antibiotic, told her yes per dr simpson Follow-up by: Adella Hare LPN,  October 14, 2009 1:27 PM

## 2010-08-08 NOTE — Assessment & Plan Note (Signed)
Summary: office visit   Vital Signs:  Patient profile:   77 year old female Menstrual status:  hysterectomy Height:      65 inches Weight:      127 pounds BMI:     21.21 O2 Sat:      97 % Temp:     99.5 degrees F oral Pulse rate:   85 / minute Pulse rhythm:   regular Resp:     16 per minute BP sitting:   100 / 70  (left arm) Cuff size:   regular  Vitals Entered By: Everitt Amber LPN (October 12, 1608 8:43 AM) CC: sore throat, fever, she coughed up blood this morning. Headaches and also pain in left side   Primary Care Provider:  [name], MD   CC:  sore throat, fever, and she coughed up blood this morning. Headaches and also pain in left side.  History of Present Illness: Pt  reports  that this morning  she had brown post nasal drainage which she hawked up, she also rep[orts soree throat and chills. Prior to this , she had been doing well, and reports improvement in her anxiety nowthat she is on buspar. Both she and the daughter present re very interested in referral to a caregiver program.   Allergies: 1)  ! Benadryl  Review of Systems      See HPI General:  Complains of chills and fatigue. Eyes:  Denies blurring and discharge. ENT:  Complains of nasal congestion, postnasal drainage, sinus pressure, and sore throat; 2 day history. CV:  Complains of fatigue and palpitations; denies chest pain or discomfort, shortness of breath with exertion, and swelling of feet; occasional palpitations. Resp:  Complains of cough; denies shortness of breath and sputum productive; occasional. GI:  Complains of abdominal pain, dark tarry stools, excessive appetite, and yellowish skin color; denies change in bowel habits, constipation, diarrhea, nausea, and vomiting. GU:  Denies dysuria and urinary frequency. MS:  Complains of joint pain and stiffness. Neuro:  Complains of memory loss. Psych:  Complains of anxiety; denies depression, panic attacks, sense of great danger, suicidal  thoughts/plans, thoughts of violence, and unusual visions or sounds; improved. Endo:  Denies cold intolerance, excessive hunger, excessive thirst, excessive urination, heat intolerance, polyuria, and weight change. Heme:  Denies abnormal bruising and bleeding. Allergy:  Complains of seasonal allergies; perennial on immunotherapy.  Physical Exam  General:  Well-developed,well-nourished,in no acute distress; alert,appropriate and cooperative throughout examination HEENT: No facial asymmetry,  EOMI, maxillary  sinus tenderness, TM's Clear, oropharynx  erythematous and moist. Tender anterior cervical adenitis  Chest: Clear to auscultation bilaterally.  CVS: S1, S2, No murmurs, No S3.   Abd: Soft, Nontender.  MS: Adequate ROM spine, hips, shoulders and knees.  Ext: No edema.   CNS: CN 2-12 intact, power tone and sensation normal throughout.   Skin: Intact, no visible lesions or rashes.  Psych: Good eye contact, normal affect.  Memory loss, short term, not anxious or depressed appearing.     Impression & Recommendations:  Problem # 1:  STREPTOCOCCAL SORE THROAT (ICD-034.0) Assessment Comment Only  The following medications were removed from the medication list:    Zithromax Z-pak 250 Mg Tabs (Azithromycin) ..... Use as directed Her updated medication list for this problem includes:    Bayer Aspirin 325 Mg Tabs (Aspirin) .Marland Kitchen... Take 1 tablet by mouth once a day    Penicillin V Potassium 500 Mg Tabs (Penicillin v potassium) .Marland Kitchen... Take 1 tablet by mouth three  times a day  Orders: Rocephin  250mg  (U9811) Admin of Therapeutic Inj  intramuscular or subcutaneous (91478)  Problem # 2:  ACUTE SINUSITIS, UNSPECIFIED (ICD-461.9) Assessment: Comment Only  The following medications were removed from the medication list:    Zithromax Z-pak 250 Mg Tabs (Azithromycin) ..... Use as directed Her updated medication list for this problem includes:    Penicillin V Potassium 500 Mg Tabs (Penicillin v  potassium) .Marland Kitchen... Take 1 tablet by mouth three times a day  Problem # 3:  ALLERGIC RHINITIS CAUSE UNSPECIFIED (ICD-477.9) Assessment: Comment Only  Orders: Admin of patients own med IM/SQ (29562Z)  Problem # 4:  PRESENILE DEMENTIA, UNCOMPLICATED (ICD-290.10) Assessment: Unchanged pt to continue the exelon patch, and referral for caregiver assitance made  Problem # 5:  GERD (ICD-530.81) Assessment: Improved  Her updated medication list for this problem includes:    Nexium 40 Mg Cpdr (Esomeprazole magnesium) ..... One cap by mouth qd  Problem # 6:  GENERALIZED ANXIETY DISORDER (ICD-300.02) Assessment: Improved  Her updated medication list for this problem includes:    Clorazepate Dipotassium 7.5 Mg Tabs (Clorazepate dipotassium) ..... One half tab in am and one tab qhs    Buspar 5 Mg Tabs (Buspirone hcl) .Marland Kitchen... Take 1 tablet by mouth two times a day  Complete Medication List: 1)  Advair Diskus 250-50 Mcg/dose Misc (Fluticasone-salmeterol) .Marland Kitchen.. 1puff and rinse, twice daily 2)  Allergy Vaccine 1-10 Gets At Pcp (w-e)  3)  Epipen 0.3 Mg/0.79ml Devi (Epinephrine) .... For severe allergic reaction 4)  Crestor 20 Mg Tabs (Rosuvastatin calcium) .... Take 1 tablet by mouth every other night at bedtime 5)  Metoprolol Succinate 25 Mg Tb24 (Metoprolol succinate) .... 1/2 tab two times a day 6)  Bayer Aspirin 325 Mg Tabs (Aspirin) .... Take 1 tablet by mouth once a day 7)  Citracal Maximum 315-250 Mg-unit Tabs (Calcium citrate-vitamin d) .... Take 2 tablets daily 8)  Nexium 40 Mg Cpdr (Esomeprazole magnesium) .... One cap by mouth qd 9)  Miralax Powd (Polyethylene glycol 3350) .... Take 1/2 dose daily as needed 10)  Alendronate Sodium 70 Mg Tabs (Alendronate sodium) .... Take one tab once weekly 11)  Clorazepate Dipotassium 7.5 Mg Tabs (Clorazepate dipotassium) .... One half tab in am and one tab qhs 12)  Align Caps (Probiotic product) .... One tablet by mouth daily 13)  Exelon 9.5 Mg/24hr Pt24  (Rivastigmine) .... One patch qd 14)  Refresh Dry Eye Therapy 1-1 % Soln (Glycerin-polysorbate 80) .... 2 drops in each eye once daily 15)  Benefiber Pack (Guar gum) .... Take 2 teaspoons by mouth once dailay dissolved in at least 8 ounces water/juice 16)  Buspar 5 Mg Tabs (Buspirone hcl) .... Take 1 tablet by mouth two times a day 17)  Penicillin V Potassium 500 Mg Tabs (Penicillin v potassium) .... Take 1 tablet by mouth three times a day  Other Orders: Rapid Strep (30865)  Patient Instructions: 1)  F/U as before. 2)  You are  being treated for sinusitis and pharyngitis. 3)  You will be treated withpenicillin  and will receive tylenol in the office for headache. 4)  You wuill be referred for caregiver assistance. Prescriptions: PENICILLIN V POTASSIUM 500 MG TABS (PENICILLIN V POTASSIUM) Take 1 tablet by mouth three times a day  #30 x 0   Entered by:   Everitt Amber LPN   Authorized by:   [name] MD   Signed by:   Everitt Amber LPN on 78/46/9629   Method used:  Printed then faxed to ...       [address] [phone]* (retail)       [address]       [place], [place]  [address]       Ph: [phone] or [phone]       Fax: [phone]   RxID:   [id number] ZITHROMAX Z-PAK 250 MG TABS (AZITHROMYCIN) Use as directed  #6 x 0   Entered and Authorized by:   [name] MD   Signed by:   [name] MD on 10/12/2009   Method used:   Electronically to        [facility] [phone]* (retail)       [address]       [place], [place]  [address]       Ph: [phone] or [phone]       Fax: [phone]   RxID:   [id number]    Medication Administration  Injection # 1:    Medication: Rocephin  250mg     Diagnosis: STREPTOCOCCAL SORE THROAT (ICD-034.0)    Route: IM    Site: LUOQ gluteus    Exp Date: 10/2011    Lot #: [id number]    Mfr: novaplus    Comments: 500mg  given     Patient tolerated injection without  complications    Given by: Everitt Amber LPN (October 12, 3149 11:01 AM)  Orders Added: 1)  Est. Patient Level IV [76160] 2)  Admin of patients own med IM/SQ [96372M] 3)  Rocephin  250mg  [J0696] 4)  Admin of Therapeutic Inj  intramuscular or subcutaneous [96372] 5)  Rapid Strep [73710]     Laboratory Results  Date/Time Received: October 12, 2009  Date/Time Reported: October 12, 2009   Other Tests  Rapid Strep: positive

## 2010-08-08 NOTE — Letter (Signed)
Summary: demographic  demographic   Imported By: Curtis Sites 12/09/2009 11:26:03  _____________________________________________________________________  External Attachment:    Type:   Image     Comment:   External Document

## 2010-08-08 NOTE — Assessment & Plan Note (Signed)
Summary: inj  Nurse Visit   Vital Signs:  Patient profile:   77 year old female Menstrual status:  hysterectomy Height:      65 inches Weight:      128.25 pounds BMI:     21.42 O2 Sat:      98 % on Room air Pulse rate:   91 / minute Resp:     16 per minute BP sitting:   110 / 70  Vitals Entered By: Everitt Amber (July 28, 2009 10:42 AM)  O2 Flow:  Room air CC: Came in to get weekly allergy shot   Allergies: 1)  ! Benadryl  Orders Added: 1)  Admin of patients own med IM/SQ [96372M]   Orders Added: 1)  Admin of patients own med IM/SQ [facility]

## 2010-08-08 NOTE — Assessment & Plan Note (Signed)
Summary: INJ  Nurse Visit   Vital Signs:  Patient profile:   77 year old female Menstrual status:  hysterectomy Height:      65 inches Weight:      125.50 pounds BMI:     20.96 O2 Sat:      98 % Pulse rate:   65 / minute Resp:     16 per minute  Vitals Entered By: Everitt Amber LPN (January 19, 2010 2:28 PM) CC: Patient in to get weekly allergy injection    Allergies: 1)  ! Benadryl 2)  ! Exelon (Rivastigmine)  Orders Added: 1)  Admin of patients own med IM/SQ [96372M]   Orders Added: 1)  Admin of patients own med IM/SQ [16109U]

## 2010-08-08 NOTE — Letter (Signed)
Summary: xray  xray   Imported By: Curtis Sites 12/09/2009 11:30:38  _____________________________________________________________________  External Attachment:    Type:   Image     Comment:   External Document

## 2010-08-08 NOTE — Assessment & Plan Note (Signed)
Summary: shot  Nurse Visit  Comments Patient here to get weekly allergy injection    Allergies: 1)  ! Benadryl 2)  ! Exelon (Rivastigmine)  Orders Added: 1)  Admin of patients own med IM/SQ [96372M]   Orders Added: 1)  Admin of patients own med IM/SQ [16109U]  Appended Document: shot given by Mauricia Area, CMA

## 2010-08-08 NOTE — Letter (Signed)
Summary: Historic Patient File  Historic Patient File   Imported By: Lind Guest 07/12/2009 10:52:59  _____________________________________________________________________  External Attachment:    Type:   Image     Comment:   External Document

## 2010-08-08 NOTE — Assessment & Plan Note (Signed)
Summary: INJ  Nurse Visit   Vital Signs:  Patient profile:   77 year old female Menstrual status:  hysterectomy Height:      65 inches Weight:      124.25 pounds BMI:     20.75 O2 Sat:      98 % Pulse rate:   67 / minute Resp:     16 per minute  Vitals Entered By: Everitt Amber LPN (December 22, 2009 10:47 AM) CC: Patient in to get weekly allergy injection    Allergies: 1)  ! Benadryl  Orders Added: 1)  Admin of patients own med IM/SQ [96372M]   Orders Added: 1)  Admin of patients own med IM/SQ [facility]

## 2010-08-08 NOTE — Miscellaneous (Signed)
Summary: Health Care POA  Health Care POA   Imported By: Sherian Rein 12/02/2009 11:14:48  _____________________________________________________________________  External Attachment:    Type:   Image     Comment:   External Document

## 2010-08-08 NOTE — Progress Notes (Signed)
Summary: [facility]  Phone Note Call from Patient   Summary of Call: PRO[facility] CARE DID COME SEE HER SHE FORGOT TO TELL  YOU  SAID IT WAS A REAL NICE LADY Initial call taken by: Lind Guest,  Nov 28, 2009 4:40 PM  Follow-up for Phone Call        noted and thanks Follow-up by: [name] MD,  Nov 28, 2009 5:02 PM

## 2010-08-08 NOTE — Letter (Signed)
Summary: misc  misc   Imported By: Curtis Sites 12/09/2009 11:37:14  _____________________________________________________________________  External Attachment:    Type:   Image     Comment:   External Document

## 2010-08-08 NOTE — Progress Notes (Signed)
Summary: DR. Suszanne Conners  DR. TEOH   Imported By: Lind Guest 01/10/2010 13:11:37  _____________________________________________________________________  External Attachment:    Type:   Image     Comment:   External Document

## 2010-08-08 NOTE — Progress Notes (Signed)
[facility]   Imported By: Lind Guest 02/15/2010 11:43:15  _____________________________________________________________________  External Attachment:    Type:   Image     Comment:   External Document

## 2010-08-08 NOTE — Assessment & Plan Note (Signed)
Summary: inj  Nurse Visit   Vitals Entered By: Adella Hare LPN (January 14, 1883 11:34 AM)  Allergies: 1)  ! Benadryl 2)  ! Exelon (Rivastigmine)  Orders Added: 1)  Admin of patients own med IM/SQ [96372M]   Orders Added: 1)  Admin of patients own med IM/SQ [96372M]  0.5 cc of patient allergy medication injected in left arm subcutaneously patient tolerated well Adella Hare LPN  January 14, 1659 11:35 AM

## 2010-08-08 NOTE — Assessment & Plan Note (Signed)
Summary: INJ  Nurse Visit   Vital Signs:  Patient profile:   77 year old female Menstrual status:  hysterectomy Height:      65 inches Weight:      130.50 pounds BMI:     21.79 O2 Sat:      98 % Pulse rate:   75 / minute Resp:     16 per minute  Vitals Entered By: Everitt Amber LPN (March 23, 2010 11:31 AM) CC: Patient in to get weekly allergy injection    Allergies: 1)  ! Benadryl 2)  ! Exelon (Rivastigmine)  Orders Added: 1)  Admin of patients own med IM/SQ [96372M]   Orders Added: 1)  Admin of patients own med IM/SQ [[id number]]

## 2010-08-08 NOTE — Assessment & Plan Note (Signed)
Summary: inj  Nurse Visit  Comments patient in for weekly allergy injection .5cc given in right arm subcutaneously tolerated well Adella Hare LPN  March 30, 2010 11:35 AM    Allergies: 1)  ! Benadryl 2)  ! Exelon (Rivastigmine)  Orders Added: 1)  Admin of Therapeutic Inj  intramuscular or subcutaneous [96372]   Orders Added: 1)  Admin of Therapeutic Inj  intramuscular or subcutaneous [16109]

## 2010-08-08 NOTE — Assessment & Plan Note (Signed)
Summary: INJ  Nurse Visit   Vital Signs:  Patient profile:   77 year old female Menstrual status:  hysterectomy Height:      65 inches Weight:      127.25 pounds BMI:     21.25 O2 Sat:      98 % Pulse rate:   75 / minute Resp:     16 per minute  Vitals Entered By: Everitt Amber LPN (February 02, 2010 1:58 PM) CC: Patient in to get allergy injection   Allergies: 1)  ! Benadryl 2)  ! Exelon (Rivastigmine)  Orders Added: 1)  Admin of patients own med IM/SQ [96372M]   Orders Added: 1)  Admin of patients own med IM/SQ [[id number]]

## 2010-08-08 NOTE — Letter (Signed)
Summary: history and physical  history and physical   Imported By: Curtis Sites 12/09/2009 11:28:46  _____________________________________________________________________  External Attachment:    Type:   Image     Comment:   External Document

## 2010-08-08 NOTE — Assessment & Plan Note (Signed)
Summary: INJ  Nurse Visit   Vital Signs:  Patient profile:   77 year old female Menstrual status:  hysterectomy Height:      65 inches Weight:      131 pounds BMI:     21.88 Pulse rate:   79 / minute Resp:     16 per minute BP sitting:   120 / 60  (left arm)  Vitals Entered By: Worthy Keeler LPN (July 13, 2009 4:10 PM) Comments patient in for weekly allergy injection .1cc subcutaneously in left arm tolerated well Worthy Keeler LPN  July 13, 2009 4:11 PM    Allergies: 1)  ! Benadryl  Orders Added: 1)  Admin of patients own med IM/SQ [96372M]   Orders Added: 1)  Admin of patients own med IM/SQ [facility]

## 2010-08-08 NOTE — Assessment & Plan Note (Signed)
Summary: INJ  Nurse Visit   Vital Signs:  Patient profile:   77 year old female Menstrual status:  hysterectomy Height:      65 inches Weight:      128.25 pounds BMI:     21.42 O2 Sat:      97 % on Room air Pulse rate:   82 / minute Resp:     16 per minute  Vitals Entered By: Everitt Amber LPN (September 08, 8117 3:55 PM)  O2 Flow:  Room air Comments to get weekly allergy injection    Allergies: 1)  ! Benadryl  Orders Added: 1)  Admin of patients own med IM/SQ [96372M]   Orders Added: 1)  Admin of patients own med IM/SQ [facility]

## 2010-08-08 NOTE — Assessment & Plan Note (Signed)
Summary: office visit   Vital Signs:  Patient profile:   77 year old female Menstrual status:  hysterectomy Height:      65 inches Weight:      127.25 pounds O2 Sat:      98 % Pulse rate:   66 / minute Resp:     16 per minute BP sitting:   130 / 70  (left arm) Cuff size:   regular  Vitals Entered By: Everitt Amber LPN (January 05, 2010 2:00 PM) CC: Follow up visit   Primary Care Provider:  [name], MD   CC:  Follow up visit.  History of Present Illness: Pt states she feels much better having stopped the exelon patch 1 wek ago, she reprts no more abdominal pain, itching, rash ,dizziness , headache over her eye. Shw states no more palpitations. She had been to the Ed twice and the Ed doc had recommended that she stop the exelon patch. She denies any recent fevr or chills. She denies head or chest congestion. She denies abdominal pain, vomitting, nausea , diarreah or constipation. She denies dysuria, frequency or incontinence.    Current Medications (verified): 1)  Advair Diskus 250-50 Mcg/dose Misc (Fluticasone-Salmeterol) .Marland Kitchen.. 1puff and Rinse, Twice Daily 2)  Allergy Vaccine 1-10 Gets At Pcp (W-E) 3)  Epipen 0.3 Mg/0.61ml Devi (Epinephrine) .... For Severe Allergic Reaction 4)  Crestor 20 Mg  Tabs (Rosuvastatin Calcium) .... Take 1 Tablet By Mouth Every Other Night At Bedtime 5)  Metoprolol Succinate 25 Mg  Tb24 (Metoprolol Succinate) .... 1/2 Tab Two Times A Day 6)  Citracal Maximum 315-250 Mg-Unit Tabs (Calcium Citrate-Vitamin D) .... Take 2 Tablets Daily 7)  Nexium 40 Mg Cpdr (Esomeprazole Magnesium) .... One Cap By Mouth Qd 8)  Miralax  Powd (Polyethylene Glycol 3350) .... Take 1/2 Dose Daily As Needed 9)  Alendronate Sodium 70 Mg Tabs (Alendronate Sodium) .... Take One Tab Once Weekly 10)  Clorazepate Dipotassium 7.5 Mg Tabs (Clorazepate Dipotassium) .... One Half Tab in Am and One Tab Qhs 11)  Align  Caps (Probiotic Product) .... One Tablet By Mouth Daily 12)   Benefiber  Pack (Guar Gum) .... Take 2 Teaspoons By Mouth Once Dailay Dissolved in At Least 8 Ounces Water/juice 13)  Buspar 5 Mg Tabs (Buspirone Hcl) .... Take 1 Tablet By Mouth Two Times A Day 14)  Aspir-Low 81 Mg Tbec (Aspirin) .... Take 1 Tablet By Mouth Once A Day 15)  Meclizine Hcl 12.5 Mg Tabs (Meclizine Hcl) .... Take 1 Tablet By Mouth Three Times A Day As Needed For Dizziness 16)  Proair Hfa 108 (90 Base) Mcg/act Aers (Albuterol Sulfate) .... 2 Puffs Qid As Needed  Allergies (verified): 1)  ! Benadryl 2)  ! Exelon (Rivastigmine)  Review of Systems      See HPI Eyes:  Complains of vision loss-both eyes; wears corrective lenses. ENT:  Denies hoarseness, nasal congestion, and sinus pressure. CV:  Denies chest pain or discomfort, difficulty breathing while lying down, palpitations, shortness of breath with exertion, and swelling of feet. MS:  Complains of joint pain and stiffness. Psych:  Complains of anxiety; denies depression, suicidal thoughts/plans, thoughts of violence, and unusual visions or sounds. Endo:  Denies cold intolerance, excessive hunger, excessive urination, heat intolerance, and polyuria. Heme:  Denies abnormal bruising and bleeding. Allergy:  Complains of seasonal allergies; pt on immunotherapy.  Physical Exam  General:  Well-developed,well-nourished,in no acute distress; alert,appropriate and cooperative throughout examination HEENT: No facial asymmetry,  EOMI, frontal sinus  tenderness, TM's Clear, oropharynx  pink and moist.   Chest: Clear to auscultation bilaterally.  CVS: S1, S2, No murmurs, No S3.   Abd: Soft, Nontender.  MS: decreased  ROM spine, hips, shoulders and knees.  Ext: No edema.   CNS: CN 2-12 intact, power tone and sensation normal throughout.   Skin: Intact, no visible lesions or rashes.  Psych: Good eye contact, normal affect.  Memory loss,not anxious or depressed appearing.    Impression & Recommendations:  Problem # 1:  INSOMNIA  (ICD-780.52) Assessment Unchanged  Discussed sleep hygiene. Pt to continue clorazepate  Problem # 2:  GENERALIZED ANXIETY DISORDER (ICD-300.02) Assessment: Improved  Her updated medication list for this problem includes:    Clorazepate Dipotassium 7.5 Mg Tabs (Clorazepate dipotassium) ..... One half tab in am and one tab qhs    Buspar 5 Mg Tabs (Buspirone hcl) .Marland Kitchen... Take 1 tablet by mouth two times a day  Problem # 3:  PALPITATIONS (ICD-785.1) Assessment: Improved  Her updated medication list for this problem includes:    Metoprolol Succinate 25 Mg Tb24 (Metoprolol succinate) .Marland Kitchen... 1/2 tab two times a day  Problem # 4:  PRESENILE DEMENTIA, UNCOMPLICATED (ICD-290.10) Assessment: Comment Only pt has eleced to d/c exelon patch due to s/e . She is involved in the dementia profgram , and is looking fwd to teaching sewing  Problem # 5:  GERD (ICD-530.81) Assessment: Improved  Her updated medication list for this problem includes:    Nexium 40 Mg Cpdr (Esomeprazole magnesium) ..... One cap by mouth qd  Problem # 6:  HYPERLIPIDEMIA (ICD-272.4) Assessment: Unchanged  Her updated medication list for this problem includes:    Crestor 20 Mg Tabs (Rosuvastatin calcium) .Marland Kitchen... Take 1 tablet by mouth every other night at bedtime    Crestor 10 Mg Tabs (Rosuvastatin calcium) .Marland Kitchen... Take 2 tabs at bedtime  Labs Reviewed: SGOT: 30 (11/07/2009)   SGPT: 23 (11/07/2009)   HDL:83 (11/07/2009), 78 (06/15/2009)  LDL:79 (11/07/2009), 82 (06/15/2009)  Chol:172 (11/07/2009), 170 (06/15/2009)  Trig:51 (11/07/2009), 49 (06/15/2009)  Problem # 7:  ALLERGIC RHINITIS CAUSE UNSPECIFIED (ICD-477.9) Assessment: Comment Only  Orders: Admin of patients own med IM/SQ ([id number])  Complete Medication List: 1)  Advair Diskus 250-50 Mcg/dose Misc (Fluticasone-salmeterol) .Marland Kitchen.. 1puff and rinse, twice daily 2)  Allergy Vaccine 1-10 Gets At Pcp (w-e)  3)  Epipen 0.3 Mg/0.49ml Devi (Epinephrine) .... For severe allergic  reaction 4)  Crestor 20 Mg Tabs (Rosuvastatin calcium) .... Take 1 tablet by mouth every other night at bedtime 5)  Metoprolol Succinate 25 Mg Tb24 (Metoprolol succinate) .... 1/2 tab two times a day 6)  Citracal Maximum 315-250 Mg-unit Tabs (Calcium citrate-vitamin d) .... Take 2 tablets daily 7)  Nexium 40 Mg Cpdr (Esomeprazole magnesium) .... One cap by mouth qd 8)  Miralax Powd (Polyethylene glycol 3350) .... Take 1/2 dose daily as needed 9)  Alendronate Sodium 70 Mg Tabs (Alendronate sodium) .... Take one tab once weekly 10)  Clorazepate Dipotassium 7.5 Mg Tabs (Clorazepate dipotassium) .... One half tab in am and one tab qhs 11)  Align Caps (Probiotic product) .... One tablet by mouth daily 12)  Benefiber Pack (Guar gum) .... Take 2 teaspoons by mouth once dailay dissolved in at least 8 ounces water/juice 13)  Buspar 5 Mg Tabs (Buspirone hcl) .... Take 1 tablet by mouth two times a day 14)  Aspir-low 81 Mg Tbec (Aspirin) .... Take 1 tablet by mouth once a day 15)  Meclizine Hcl 12.5 Mg Tabs (  Meclizine hcl) .... Take 1 tablet by mouth three times a day as needed for dizziness 16)  Proair Hfa 108 (90 Base) Mcg/act Aers (Albuterol sulfate) .... 2 puffs qid as needed 17)  Crestor 10 Mg Tabs (Rosuvastatin calcium) .... Take 2 tabs at bedtime  Patient Instructions: 1)  Please schedule a follow-up appointment in 2 months. 2)  I am glad that you are doing so much bette 3)  Pls do not take any more exelon Prescriptions: CRESTOR 10 MG TABS (ROSUVASTATIN CALCIUM) Take 2 tabs at bedtime  #84 x 0   Entered by:   Everitt Amber LPN   Authorized by:   [name] MD   Signed by:   [name] MD on 01/05/2010   Method used:   Samples Given   RxID:   [id number]    Orders Added: 1)  Est. Patient Level IV [16010] 2)  Admin of patients own med IM/SQ [[id number]]

## 2010-08-08 NOTE — Progress Notes (Signed)
Summary: ? medicine  Phone Note Call from Patient   Summary of Call: needs for you to call her about a medicine that she bought over the counter  Initial call taken by: Lind Guest,  May 29, 2010 10:17 AM  Follow-up for Phone Call        patient was in office today and did not mention Follow-up by: Adella Hare LPN,  May 30, 2010 6:27 PM

## 2010-08-08 NOTE — Assessment & Plan Note (Signed)
Summary: DIGESTING PROBLEMS/HEMORRHOIDS--CH   History of Present Illness Visit Type: Follow-up Visit Primary GI MD: Kathleen Head MD [facility] Primary Provider: [name], MD  Requesting Provider: n/a Chief Complaint: constipation, hemorrhoids History of Present Illness:   She has had change in size of stools over the past few months. Pinto-bean size to walnuts. Clearly better on MiraLax. Formed and "coonected" then. She has counted up to 25 marbles in the stool at times. Has had soe tan wet liquid discharge from rectum that irritaes the anus. Some slight red or pinkish discharge also at times. She noticed that after beginning estrogens or it got worse then (on it x 5 days). Started for vaginal discharge and suspected atrophic vaginitis.  Uses the MiraLax after 4PM meal. Gets gas in RLQ in early AM at times. That is her "knot"   GI Review of Systems    Reports bloating.      Denies abdominal pain, acid reflux, belching, chest pain, dysphagia with liquids, dysphagia with solids, heartburn, loss of appetite, nausea, vomiting, vomiting blood, weight loss, and  weight gain.      Reports constipation and  hemorrhoids.     Denies anal fissure, black tarry stools, change in bowel habit, diarrhea, diverticulosis, fecal incontinence, heme positive stool, irritable bowel syndrome, jaundice, light color stool, liver problems, rectal bleeding, and  rectal pain.    Current Medications (verified): 1)  Advair Diskus 250-50 Mcg/dose Misc (Fluticasone-Salmeterol) .Marland Kitchen.. 1puff and Rinse, Twice Daily 2)  Allergy Vaccine 1-10 Gets At Pcp (W-E) 3)  Epipen 0.3 Mg/0.86ml Devi (Epinephrine) .... For Severe Allergic Reaction 4)  Crestor 20 Mg  Tabs (Rosuvastatin Calcium) .... Take 1 Tablet By Mouth Every Other Night At Bedtime 5)  Metoprolol Succinate 25 Mg  Tb24 (Metoprolol Succinate) .... 1/2 Tab Two Times A Day 6)  Bayer Aspirin 325 Mg Tabs (Aspirin) .... Take 1 Tablet By Mouth Once A Day 7)  Citracal  Maximum 315-250 Mg-Unit Tabs (Calcium Citrate-Vitamin D) .... Take 2 Tablets Daily 8)  Nexium 40 Mg Cpdr (Esomeprazole Magnesium) .... One Cap By Mouth Qd 9)  Miralax  Powd (Polyethylene Glycol 3350) .... Take 1/2 Dose Daily As Needed 10)  Alendronate Sodium 70 Mg Tabs (Alendronate Sodium) .... Take One Tab Once Weekly 11)  Clorazepate Dipotassium 7.5 Mg Tabs (Clorazepate Dipotassium) .... One Half Tab in Am and One Tab Qhs 12)  Align  Caps (Probiotic Product) .... One Tablet By Mouth Daily 13)  Exelon 9.5 Mg/24hr Pt24 (Rivastigmine) .... One Patch Qd 14)  Refresh Dry Eye Therapy 1-1 % Soln (Glycerin-Polysorbate 80) .... 2 Drops in Each Eye Once Daily 15)  Flonase 50 Mcg/act Susp (Fluticasone Propionate) .... One To Two Puffs Per Nostril 16)  Vagifem 10 Mcg Tabs (Estradiol) .... Once Daily For Seven Days, Then 2 Tablet Twice Awek  Allergies (verified): 1)  ! Benadryl  Past History:  Past Medical History: Reviewed history from 11/10/2007 and no changes required. IBS ALLERGIES GENERALIZED ANXIETY DISORDER (ICD-300.02) OSTEOARTHRITIS (ICD-715.90) HYPERLIPIDEMIA (ICD-272.4) LUNG NODULE (ICD-518.89) Hx of ANGIOEDEMA (ICD-995.1) ALLERGIC RHINITIS (ICD-477.9) ASTHMA (ICD-493.90) CARPAL TUNNEL SYNDROME (ICD-354.0)  CHRONIC ABDOMINAL PAIN SECONDARY TO IBS, CONSTIPATION PREDOMINANT  Past Surgical History: HYSTERECTOMY fibroids 1979 COLONOSCOPY 2006 normal - redundant Thyroid biopsy 1970 Bilateral cateract surgery 2008  Family History: Reviewed history from 10/04/2008 and no changes required. THREE CHILDREN MOTHER DECEASED IN CHILDBIRTH/ HYPERTENSIVE FATHER DECEASED PANCREATIC CANCER THREE LIVING SIBLINGS HEALTHY SEVEN DECEASED / ONE LEUKEMIA / ONE PANCREATIC CANCER Family History of Colon Cancer:Brother  Social  History: Reviewed history from 11/10/2007 and no changes required. [employer] Retired Never Smoked Alcohol use-no Drug use-no  Vital Signs:  Patient profile:   77 year  old female Menstrual status:  hysterectomy Height:      65 inches Weight:      125.25 pounds BMI:     20.92 Pulse rate:   60 / minute Pulse rhythm:   regular BP sitting:   110 / 70  (left arm) Cuff size:   regular  Vitals Entered By: Kathleen Gallegos CMA Duncan Dull) (July 27, 2009 11:14 AM)  Physical Exam  General:  elderly NAD Eyes:  anicteric Abdomen:  soft and non-tender thin abd wall and palpable colon Rectal:  female staff present no mass, small rectocele brown stool, resting tne ok Skin:  warm and dry Psych:  Alert and cooperative. Normal mood and affect. Additional Exam:  ANOSCOPY: Internal hemorrhoids   Impression & Recommendations:  Problem # 1:  IRRITABLE BOWEL SYNDROME (ICD-564.1) Assessment Deteriorated Bowel habits are changing again. Will add benefiber to Miralax. She was reassured.  Problem # 2:  HEMORRHOIDS, WITH BLEEDING (ICD-455.8) Assessment: New treat constipaion bleeding is minor so no Rx therapy  Patient Instructions: 1)  Please begin taking Benefiber 2 teaspoons daily dissolved in at least 8 ounces of water/juice. You can purchase this over the counter at your local drugstore. We have given you some samples to try. 2)  Follow up with [place] as needed for your gastroentestinal needs. 3)  Copy sent to : Kathleen Overman, MD 4)  The medication list was reviewed and reconciled.  All changed / newly prescribed medications were explained.  A complete medication list was provided to the patient / caregiver.

## 2010-08-08 NOTE — Letter (Signed)
Summary: lab  lab   Imported By: Curtis Sites 12/09/2009 11:43:38  _____________________________________________________________________  External Attachment:    Type:   Image     Comment:   External Document

## 2010-08-08 NOTE — Assessment & Plan Note (Signed)
Summary: INJ  Nurse Visit   Vital Signs:  Patient profile:   77 year old female Menstrual status:  hysterectomy Height:      65 inches Weight:      127.50 pounds BMI:     21.29 O2 Sat:      98 % Pulse rate:   72 / minute Resp:     16 per minute  Vitals Entered By: Everitt Amber LPN (January 26, 2010 11:48 AM) CC: Patient to recieve allergy injection    Allergies: 1)  ! Benadryl 2)  ! Exelon (Rivastigmine)  Orders Added: 1)  Admin of patients own med IM/SQ [[id number]]   Orders Added: 1)  Admin of patients own med IM/SQ [[id number]]

## 2010-08-08 NOTE — Letter (Signed)
Summary: progress notes  progress notes   Imported By: Curtis Sites 12/09/2009 11:29:29  _____________________________________________________________________  External Attachment:    Type:   Image     Comment:   External Document

## 2010-08-08 NOTE — Progress Notes (Signed)
Summary: SUI WOOI  SUI WOOI   Imported By: Lind Guest 09/20/2009 10:07:29  _____________________________________________________________________  External Attachment:    Type:   Image     Comment:   External Document

## 2010-08-08 NOTE — Assessment & Plan Note (Signed)
Summary: FOLLOW UP 3 MONTH/SLJ   Vital Signs:  Patient profile:   77 year old female Menstrual status:  hysterectomy Height:      65 inches Weight:      128.75 pounds BMI:     21.50 O2 Sat:      97 % on Room air Pulse rate:   61 / minute Pulse rhythm:   regular Resp:     16 per minute BP sitting:   110 / 68  (left arm)  Vitals Entered By: Adella Hare LPN (June 08, 2010 1:19 PM)  O2 Flow:  Room air CC: follow-up visit Is Patient Diabetic? No Pain Assessment Patient in pain? no        Primary Care Curry Dulski:  [name], MD   CC:  follow-up visit.  History of Present Illness: Reports  that she has been doing well. She continues to go to therapy, and states that this is helping her alot. she continues to take immunotherapy, and her allergies are controlled. Denies recent fever or chills. Denies sinus pressure, nasal congestion , ear pain or sore throat. Denies chest congestion, or cough productive of sputum. Denies chest pain, palpitations, PND, orthopnea or leg swelling. Denies abdominal pain, nausea, vomitting, diarrhea or constipation.She continues to describe the balls of stool which is not new, i have encouraged increased fiber and regular use of stool softeners if needed. she staes that cold slaw helps her, so i encourage her to eat this regularly. Denies change in bowel movements or bloody stool. Denies dysuria , frequency, incontinence or hesitancy.  Denies headaches, vertigo, seizures. Denies depression, anxiety or insomnia.Controlled on med. Denies  rash, lesions, or itch.     Current Medications (verified): 1)  Advair Diskus 250-50 Mcg/dose Misc (Fluticasone-Salmeterol) .Marland Kitchen.. 1puff and Rinse, Twice Daily 2)  Allergy Vaccine 1-10 Gets At Pcp (W-E) 3)  Epipen 0.3 Mg/0.10ml Devi (Epinephrine) .... For Severe Allergic Reaction 4)  Crestor 20 Mg  Tabs (Rosuvastatin Calcium) .... Take 1 Tablet By Mouth Every Other Night At Bedtime 5)  Metoprolol  Succinate 25 Mg  Tb24 (Metoprolol Succinate) .... 1/2 Tab Two Times A Day 6)  Citracal Maximum 315-250 Mg-Unit Tabs (Calcium Citrate-Vitamin D) .... Take 2 Tablets Daily 7)  Nexium 40 Mg Cpdr (Esomeprazole Magnesium) .... One Cap By Mouth Qd 8)  Miralax  Powd (Polyethylene Glycol 3350) .... Take 1/2 Dose Daily As Needed 9)  Alendronate Sodium 70 Mg Tabs (Alendronate Sodium) .... Take One Tab Once Weekly 10)  Clorazepate Dipotassium 7.5 Mg Tabs (Clorazepate Dipotassium) .... One Half Tab in Am and One Tab Qhs 11)  Align  Caps (Probiotic Product) .... One Tablet By Mouth Daily 12)  Benefiber  Pack (Guar Gum) .... Take 2 Teaspoons By Mouth Once Dailay Dissolved in At Least 8 Ounces Water/juice 13)  Buspirone Hcl 5 Mg Tabs (Buspirone Hcl) .... Take 1 Tablet By Mouth Two Times A Day 14)  Aspir-Low 81 Mg Tbec (Aspirin) .... Take 1 Tablet By Mouth Once A Day 15)  Crestor 10 Mg Tabs (Rosuvastatin Calcium) .... Take 2 Tabs At Bedtime  Allergies (verified): 1)  ! Benadryl 2)  ! Exelon (Rivastigmine)  Review of Systems      See HPI General:  Complains of sleep disorder. Eyes:  Denies blurring, discharge, eye pain, and red eye. ENT:  burning in back opf throat, concerned about the alendronate. GI:  constipation, with little balls. MS:  Complains of joint pain; right hip pain when lying directly on that  [employer].es neck and back pain, also shoulder pain. Neuro:  Complains of memory loss. Psych:  Complains of anxiety; denies depression, panic attacks, sense of great danger, suicidal thoughts/plans, thoughts of violence, and unusual visions or sounds. Endo:  Denies excessive hunger, excessive thirst, and excessive urination. Heme:  Denies abnormal bruising and bleeding. Allergy:  Complains of seasonal allergies; on immunotherapy.  Physical Exam  General:  Well-developed,well-nourished,in no acute distress; alert,appropriate and cooperative throughout examination HEENT: No facial asymmetry,    EOMI, No sinus tenderness, TM's Clear, oropharynx  pink and moist.   Chest: Clear to auscultation bilaterally.  CVS: S1, S2, No murmurs, No S3.   Abd: Soft, Nontender.  NG:[id number]  ROM spine, hips, shoulders and knees.  Ext: No edema.   CNS: CN 2-12 intact, power tone and sensation normal throughout.   Skin: Intact, no visible lesions or rashes.  Psych: Good eye contact, normal affect.  Memory loss, not anxious or depressed appearing.    Impression & Recommendations:  Problem # 1:  HIP PAIN, RIGHT (ICD-719.45) Assessment Deteriorated  Her updated medication list for this problem includes:    Aspir-low 81 Mg Tbec (Aspirin) .Marland Kitchen... Take 1 tablet by mouth once a day  Orders: Radiology other (Radiology Other)  Problem # 2:  INSOMNIA (ICD-780.52) Assessment: Improved  Discussed sleep hygiene.  continue clorazepate  Problem # 3:  GENERALIZED ANXIETY DISORDER (ICD-300.02) Assessment: Improved  Her updated medication list for this problem includes:    Clorazepate Dipotassium 7.5 Mg Tabs (Clorazepate dipotassium) ..... One half tab in am and one tab qhs    Buspirone Hcl 5 Mg Tabs (Buspirone hcl) .Marland Kitchen... Take 1 tablet by mouth two times a day  Problem # 4:  GERD (ICD-530.81) Assessment: Improved  Her updated medication list for this problem includes:    Nexium 40 Mg Cpdr (Esomeprazole magnesium) ..... One cap by mouth qd  Orders: Medicare Electronic Prescription [id number])  Problem # 5:  HYPERLIPIDEMIA (ICD-272.4) Assessment: Comment Only  Her updated medication list for this problem includes:    Crestor 20 Mg Tabs (Rosuvastatin calcium) .Marland Kitchen... Take 1 tablet by mouth every other night at bedtime    Crestor 10 Mg Tabs (Rosuvastatin calcium) .Marland Kitchen... Take 2 tabs at bedtime  Orders: T-Lipid Profile [phone]) T-Hepatic Function [id number])  Labs Reviewed: SGOT: 30 (11/07/2009)   SGPT: 23 (11/07/2009)   HDL:83 (11/07/2009), 78 (06/15/2009)  LDL:79 (11/07/2009), 82  (87/56/4332)  Chol:172 (11/07/2009), 170 (06/15/2009)  Trig:51 (11/07/2009), 49 (06/15/2009)  Complete Medication List: 1)  Advair Diskus 250-50 Mcg/dose Misc (Fluticasone-salmeterol) .Marland Kitchen.. 1puff and rinse, twice daily 2)  Allergy Vaccine 1-10 Gets At Pcp (w-e)  3)  Epipen 0.3 Mg/0.12ml Devi (Epinephrine) .... For severe allergic reaction 4)  Crestor 20 Mg Tabs (Rosuvastatin calcium) .... Take 1 tablet by mouth every other night at bedtime 5)  Metoprolol Succinate 25 Mg Tb24 (Metoprolol succinate) .... 1/2 tab two times a day 6)  Citracal Maximum 315-250 Mg-unit Tabs (Calcium citrate-vitamin d) .... Take 2 tablets daily 7)  Nexium 40 Mg Cpdr (Esomeprazole magnesium) .... One cap by mouth qd 8)  Miralax Powd (Polyethylene glycol 3350) .... Take 1/2 dose daily as needed 9)  Alendronate Sodium 70 Mg Tabs (Alendronate sodium) .... Take one tab once weekly 10)  Clorazepate Dipotassium 7.5 Mg Tabs (Clorazepate dipotassium) .... One half tab in am and one tab qhs 11)  Align Caps (Probiotic product) .... One tablet by mouth daily 12)  Benefiber Pack (Guar gum) .... Take 2 teaspoons by mouth once  dailay dissolved in at least 8 ounces water/juice 13)  Buspirone Hcl 5 Mg Tabs (Buspirone hcl) .... Take 1 tablet by mouth two times a day 14)  Aspir-low 81 Mg Tbec (Aspirin) .... Take 1 tablet by mouth once a day 15)  Crestor 10 Mg Tabs (Rosuvastatin calcium) .... Take 2 tabs at bedtime  Other Orders: T-Basic Metabolic Panel [id number]) T-CBC w/Diff [phone]) Admin of patients own med IM/SQ [phone]) Radiology Referral (Radiology)  Patient Instructions: 1)  f/u in 3.5 to 4 months. 2)  pls try to eat slaw regularly since this helps your bowels. You maty use colace 1 to tablets diaily as a softener 3)  it is safe to take tylenol one daily as needed for arthritis 4)  the pain in your right hip is likely due to arthritis 5)  BMP prior to visit, ICD-9: 6)  Hepatic Panel prior to visit, ICD-9:  fasting  asap  7)  Lipid Panel prior to visit, ICD-9: 8)  CBC w/ Diff prior to visit, ICD-9: Prescriptions: NEXIUM 40 MG CPDR (ESOMEPRAZOLE MAGNESIUM) one cap by mouth qd  #30 Not Speci x 3   Entered by:   Adella Hare LPN   Authorized by:   [name] MD   Signed by:   Adella Hare LPN on 95/28/4132   Method used:   Electronically to        [facility] [phone]* (retail)       [address]       [place], [place]  [address]       Ph: [phone] or [phone]       Fax: [phone]   RxID:   [id number]    Orders Added: 1)  Est. Patient Level IV [55732] 2)  Medicare Electronic Prescription [G8553] 3)  Radiology other [Radiology Other] 4)  T-Basic Metabolic Panel [id number] 5)  T-Lipid Profile [[id number]] 6)  T-Hepatic Function [80076-22960] 7)  T-CBC w/Diff [[id number]] 8)  Admin of patients own med IM/SQ [[id number]] 9)  Radiology Referral [Radiology]     Orders Added: 1)  Est. Patient Level IV [71062] 2)  Medicare Electronic Prescription [G8553] 3)  Radiology other [Radiology Other] 4)  T-Basic Metabolic Panel [[id number]] 5)  T-Lipid Profile [80061-22930] 6)  T-Hepatic Function [80076-22960] 7)  T-CBC w/Diff [[id number]] 8)  Admin of patients own med IM/SQ [[id number]] 9)  Radiology Referral [Radiology]

## 2010-08-08 NOTE — Assessment & Plan Note (Signed)
Summary: INJ  Nurse Visit   Vital Signs:  Patient profile:   77 year old female Menstrual status:  hysterectomy Height:      65 inches Weight:      127.50 pounds BMI:     21.29 Pulse rate:   71 / minute Resp:     16 per minute BP sitting:   100 / 52  (left arm)  Vitals Entered By: Worthy Keeler LPN (July 21, 2009 3:59 PM) Comments patient in for weekly allergy injection .2cc subcutaneously in right arm patient tolerated well Worthy Keeler LPN  July 21, 2009 4:01 PM    Allergies: 1)  ! Benadryl  Orders Added: 1)  Admin of patients own med IM/SQ [[id number]]   Orders Added: 1)  Admin of patients own med IM/SQ [facility]

## 2010-08-08 NOTE — Assessment & Plan Note (Signed)
Summary: rov ///kp   Copy to:  n/a Primary Provider/Referring Provider:  [name], MD   CC:  Follow up visit-breathing good; strachy throat since spring season started; allergy -drainage; worse at night-clear.Marland Kitchen  History of Present Illness: 01/27/08- 77 year old woman returning for follow-up of asthma and allergic rhinitis with history of lung nodule.  Sometimes it feels scratchy and her chest and may cough up a little clear mucus.  Does notice reflux of liquid, but it does not taste, or burn.  Worried about a white spot on her right tonsil.  Sleeps with head of bed elevated.  Continues allergy vaccine at 1:10.  Has seen an ENT physician in [place] who told her she was okay.  Admits her home stays dusky.  Denies headache, purulent or bloody discharge, chest pain or palpitation, fever, or adenopathy.  05/26/08- asthma, allergic rhinitis, lung nodule Continues allergy vaccine, doing well. Nasal congestion at night benefited by using rhinocort. Got flu vax. Denies purulent discharge, headache, wheeze, blood. Does have periodontal disease.  11/23/08- Asthma, allergic rhinitis, asthma Continues allergy vaccine, getting her shots without problems at Dr Anthony Sar office. Last cxr in Nov was reviewed- scarring but no definitie nodule now and stable. Notes some hacky cough and yellow. Denies wheeze, nasal congestion, Bothersome mucus in throat on waking in AM. She likes Advair but we discussed the side effects as listed on tv. She finds one puff daily sufficient. Fluticasone nasal spray stopped because it burned her nose. Nasal congestion noted in early AM- wakes her. Wants sample alternative.  Nov 25, 2009- Asthma. allergic rhinitis.................daughter here 30 yo never smoker. Dr Teo/ ENT had her doing saline nasal rinse which did help for awhile. Continues allergy vaccine at Dr Luther Parody' office 1:10. Nasal congestion lying down. Chest feels fine. Sometimes feels heart beat "heavy" at night.  Denies nocturnal choking or smothering. Daughter notes supine sleep and snore.  Current Medications (verified): 1)  Advair Diskus 250-50 Mcg/dose Misc (Fluticasone-Salmeterol) .Marland Kitchen.. 1puff and Rinse, Twice Daily 2)  Allergy Vaccine 1-10 Gets At Pcp (W-E) 3)  Epipen 0.3 Mg/0.75ml Devi (Epinephrine) .... For Severe Allergic Reaction 4)  Crestor 20 Mg  Tabs (Rosuvastatin Calcium) .... Take 1 Tablet By Mouth Every Other Night At Bedtime 5)  Metoprolol Succinate 25 Mg  Tb24 (Metoprolol Succinate) .... 1/2 Tab Two Times A Day 6)  Citracal Maximum 315-250 Mg-Unit Tabs (Calcium Citrate-Vitamin D) .... Take 2 Tablets Daily 7)  Nexium 40 Mg Cpdr (Esomeprazole Magnesium) .... One Cap By Mouth Qd 8)  Miralax  Powd (Polyethylene Glycol 3350) .... Take 1/2 Dose Daily As Needed 9)  Alendronate Sodium 70 Mg Tabs (Alendronate Sodium) .... Take One Tab Once Weekly 10)  Clorazepate Dipotassium 7.5 Mg Tabs (Clorazepate Dipotassium) .... One Half Tab in Am and One Tab Qhs 11)  Align  Caps (Probiotic Product) .... One Tablet By Mouth Daily 12)  Exelon 9.5 Mg/24hr Pt24 (Rivastigmine) .... One Patch Qd 13)  Refresh Dry Eye Therapy 1-1 % Soln (Glycerin-Polysorbate 80) .... 2 Drops in Each Eye Once Daily 14)  Benefiber  Pack (Guar Gum) .... Take 2 Teaspoons By Mouth Once Dailay Dissolved in At Least 8 Ounces Water/juice 15)  Buspar 5 Mg Tabs (Buspirone Hcl) .... Take 1 Tablet By Mouth Two Times A Day 16)  Aspir-Low 81 Mg Tbec (Aspirin) .... Take 1 Tablet By Mouth Once A Day  Allergies (verified): 1)  ! Benadryl  Past History:  Past Medical History: Last updated: 11/10/2007 IBS ALLERGIES GENERALIZED ANXIETY DISORDER (ICD-300.02)  OSTEOARTHRITIS (ICD-715.90) HYPERLIPIDEMIA (ICD-272.4) LUNG NODULE (ICD-518.89) Hx of ANGIOEDEMA (ICD-995.1) ALLERGIC RHINITIS (ICD-477.9) ASTHMA (ICD-493.90) CARPAL TUNNEL SYNDROME (ICD-354.0)  CHRONIC ABDOMINAL PAIN SECONDARY TO IBS, CONSTIPATION PREDOMINANT  Past Surgical  History: Last updated: 07/27/2009 HYSTERECTOMY fibroids 1979 COLONOSCOPY 2006 normal - redundant Thyroid biopsy 1970 Bilateral cateract surgery 2008  Family History: Last updated: 10/04/2008 THREE CHILDREN MOTHER DECEASED IN CHILDBIRTH/ HYPERTENSIVE FATHER DECEASED PANCREATIC CANCER THREE LIVING SIBLINGS HEALTHY SEVEN DECEASED / ONE LEUKEMIA / ONE PANCREATIC CANCER Family History of Colon Cancer:Brother  Social History: Last updated: 11/10/2007 [employer] Retired Never Smoked Alcohol use-no Drug use-no  Risk Factors: Smoking Status: never (09/23/2008)  Review of Systems      See HPI       The patient complains of hoarseness.  The patient denies anorexia, fever, weight loss, weight gain, vision loss, decreased hearing, chest pain, dyspnea on exertion, peripheral edema, prolonged cough, headaches, hemoptysis, abdominal pain, and severe indigestion/heartburn.    Vital Signs:  Patient profile:   77 year old female Menstrual status:  hysterectomy Height:      65 inches Weight:      124 pounds BMI:     20.71 O2 Sat:      98 % on Room air Pulse rate:   75 / minute BP sitting:   122 / 68  (left arm) Cuff size:   regular  Vitals Entered By: Reynaldo Minium CMA (Nov 25, 2009 10:26 AM)  O2 Flow:  Room air  Physical Exam  Additional Exam:  General: A/Ox3; pleasant and cooperative, NAD, trim  SKIN: no rash, lesions NODES: no lymphadenopathy HEENT: [place]/AT, EOM- WNL, Conjuctivae- clear, PERRLA, TM-WNL, Nose- clear, Throat- clear and wnl, Mallampati  II, no erythema or drainage NECK: Supple w/ fair ROM, JVD- none, normal carotid impulses w/o bruits Thyroid-  CHEST: Clear to P&A, decreased but clear HEART: RRR, no m/g/r heard ABDOMEN:  ZOX:WRUE, nl pulses, no edema  NEURO: Grossly intact to observation      Impression & Recommendations:  Problem # 1:  ALLERGIC RHINITIS CAUSE UNSPECIFIED (ICD-477.9)  Continues vaccine without problems. Already has head of bed elevated.  Daughter wants her to cut down meds. Will try breathe right strips for the snoring. At her age I don't think formal NPSG is appropriate based on available description.  Problem # 2:  ASTHMA (ICD-493.90) She says Advair does help, compared with being off it for awhile earlier this Spring.  Other Orders: Est. Patient Level III (45409) DME Referral (DME)  Patient Instructions: 1)  Please schedule a follow-up appointment in 1 month. 2)  Try Breath Right Strips for stuffy nose at bedtime. 3)  See [facility] to set overnight oximetry

## 2010-08-08 NOTE — Assessment & Plan Note (Signed)
Summary: SHOT  Nurse Visit   Allergies: 1)  ! Benadryl 2)  ! Exelon (Rivastigmine) pt tolerated injection, no adverse effects  Appended Document: SHOT bp 90/59 weight 131  Orders Added: 1)  Admin of patients own med IM/SQ [[id number]] patient tolerated well

## 2010-08-08 NOTE — Progress Notes (Signed)
Summary: Normal overnight oximetry on 11/26/09  Phone Note Other Incoming   Summary of Call: [employer]- Overnight oximetry normal- no time with sat less than 90% on room air on the study night. She did not qualify for home O2. This result also does not suggest sleep apnea. Initial call taken by: Waymon Budge MD,  Nov 30, 2009 6:35 PM

## 2010-08-08 NOTE — Assessment & Plan Note (Signed)
Summary: INJ  Nurse Visit   Vital Signs:  Patient profile:   78 year old female Menstrual status:  hysterectomy Height:      65 inches Weight:      123.25 pounds BMI:     20.58 O2 Sat:      98 % Pulse rate:   68 / minute Resp:     16 per minute BP sitting:   100 / 70  (left arm)  Vitals Entered By: Everitt Amber LPN (December 15, 8099 11:31 AM) CC: To get weekly allergy injection    Allergies: 1)  ! Benadryl  Orders Added: 1)  Admin of patients own med IM/SQ [96372M]   Orders Added: 1)  Admin of patients own med IM/SQ [facility]

## 2010-08-08 NOTE — Assessment & Plan Note (Signed)
Summary: inj  Nurse Visit   Vital Signs:  Patient profile:   77 year old female Menstrual status:  hysterectomy Height:      65 inches Weight:      130.25 pounds BMI:     21.75 O2 Sat:      100 % on Room air Pulse rate:   69 / minute Resp:     16 per minute BP sitting:   110 / 70  (left arm)  Vitals Entered By: Adella Hare LPN (September 15, 2009 1:36 PM)  O2 Flow:  Room air Comments patient in for weekly allergy injection .5cc subcutaneously in left arm patient tolerated well Adella Hare LPN  September 15, 2009 1:37 PM    Allergies: 1)  ! Benadryl  Orders Added: 1)  Admin of patients own med IM/SQ [[id number]]   Orders Added: 1)  Admin of patients own med IM/SQ [facility]

## 2010-08-08 NOTE — Assessment & Plan Note (Signed)
Summary: INJ  Nurse Visit  Comments patient recieved weekly allergy injection 0.5cc subcutaneously in right arm patient tolerated well Adella Hare LPN  October 06, 2009 2:27 PM    Allergies: 1)  ! Benadryl  Orders Added: 1)  Admin of patients own med IM/SQ [96372M]   Orders Added: 1)  Admin of patients own med IM/SQ [facility]

## 2010-08-08 NOTE — Assessment & Plan Note (Signed)
Summary: INJ  Nurse Visit   Vital Signs:  Patient profile:   77 year old female Menstrual status:  hysterectomy Height:      65 inches Weight:      128.25 pounds BMI:     21.42 O2 Sat:      97 % Pulse rate:   80 / minute Resp:     16 per minute BP sitting:   120 / 72  (left arm) Cuff size:   regular  Vitals Entered By: Everitt Amber LPN (March 02, 2010 3:50 PM) CC: Patient here to get weekly allergy injection    Allergies: 1)  ! Benadryl 2)  ! Exelon (Rivastigmine)  Orders Added: 1)  Admin of patients own med IM/SQ [96372M]   Orders Added: 1)  Admin of patients own med IM/SQ [[id number]]

## 2010-08-08 NOTE — Assessment & Plan Note (Signed)
Summary: F UP   Vital Signs:  Patient profile:   77 year old female Menstrual status:  hysterectomy Height:      65 inches Weight:      125.50 pounds BMI:     20.96 O2 Sat:      98 % Pulse rate:   68 / minute Pulse rhythm:   regular Resp:     16 per minute BP sitting:   110 / 66  (left arm) Cuff size:   regular  Vitals Entered By: Everitt Amber LPN (Nov 09, 100 9:08 AM) CC: follow-up visit. States she has to really watch what she eats because she'll wake up in the night with a ball in her upper abdomen and she has to rub it until it goes away or she can pass gas.    Primary Care Provider:  [name], MD   CC:  follow-up visit. States she has to really watch what she eats because she'll wake up in the night with a ball in her upper abdomen and she has to rub it until it goes away or she can pass gas. Marland Kitchen  History of Present Illness: Reports  that she has nbeendoingfairly well. She still experiences gas pains in her upper abdomen that disturb her sleep, but states that when she eats from a certain restaurant she enjoys her food more (k/W0 and has no problem. she also has concerns about the amt of meds she takes and so an internist yesterday to review this. My recommendations were discused and are documented in the D/I  Denies recent fever or chills. Denies sinus pressure, nasal congestion , ear pain or sore throat. Denies chest congestion, or cough productive of sputum. Denies chest pain, palpitations, PND, orthopnea or leg swelling. Denies  nausea, vomitting, diarrhea or constipation. Denies change in bowel movements or bloody stool.She has iBS and uses miralax and align on an as needed basis. Denies dysuria , frequency, incontinence or hesitancy. She does have mild neck l pain , with stiffnes, however this is not a problem at this time Denies headaches, vertigo, seizures. Denies depression, anxiety or insomnia. Denies  rash, lesions, or itch.She continues to have extremely  dry skin , whichscales alot, she has been prescribed med for this from dermatology which worked very well.     Current Medications (verified): 1)  Advair Diskus 250-50 Mcg/dose Misc (Fluticasone-Salmeterol) .Marland Kitchen.. 1puff and Rinse, Twice Daily 2)  Allergy Vaccine 1-10 Gets At Pcp (W-E) 3)  Epipen 0.3 Mg/0.75ml Devi (Epinephrine) .... For Severe Allergic Reaction 4)  Crestor 20 Mg  Tabs (Rosuvastatin Calcium) .... Take 1 Tablet By Mouth Every Other Night At Bedtime 5)  Metoprolol Succinate 25 Mg  Tb24 (Metoprolol Succinate) .... 1/2 Tab Two Times A Day 6)  Bayer Aspirin 325 Mg Tabs (Aspirin) .... Take 1 Tablet By Mouth Once A Day 7)  Citracal Maximum 315-250 Mg-Unit Tabs (Calcium Citrate-Vitamin D) .... Take 2 Tablets Daily 8)  Nexium 40 Mg Cpdr (Esomeprazole Magnesium) .... One Cap By Mouth Qd 9)  Miralax  Powd (Polyethylene Glycol 3350) .... Take 1/2 Dose Daily As Needed 10)  Alendronate Sodium 70 Mg Tabs (Alendronate Sodium) .... Take One Tab Once Weekly 11)  Clorazepate Dipotassium 7.5 Mg Tabs (Clorazepate Dipotassium) .... One Half Tab in Am and One Tab Qhs 12)  Align  Caps (Probiotic Product) .... One Tablet By Mouth Daily 13)  Exelon 9.5 Mg/24hr Pt24 (Rivastigmine) .... One Patch Qd 14)  Refresh Dry Eye Therapy 1-1 %  Soln (Glycerin-Polysorbate 80) .... 2 Drops in Each Eye Once Daily 15)  Benefiber  Pack (Guar Gum) .... Take 2 Teaspoons By Mouth Once Dailay Dissolved in At Least 8 Ounces Water/juice 16)  Buspar 5 Mg Tabs (Buspirone Hcl) .... Take 1 Tablet By Mouth Two Times A Day  Allergies (verified): 1)  ! Benadryl  Review of Systems      See HPI General:  Complains of fatigue and sleep disorder; advised to reduce her dependence on chlorazepate id possible, on buspar her anxiety is muchbetter. Eyes:  Complains of vision loss-both eyes; denies blurring, discharge, double vision, eye pain, and red eye. CV:  Complains of palpitations; controlled on beta blocker per cardiology. GI:   Complains of abdominal pain, constipation, diarrhea, and indigestion; IBS. Derm:  Complains of dryness. Neuro:  Complains of memory loss; maintains regular use of exelon at therapeutic dose. Psych:  Complains of anxiety; marked improvement on buspar. Endo:  Denies cold intolerance, excessive hunger, excessive thirst, excessive urination, heat intolerance, polyuria, and weight change. Heme:  Denies abnormal bruising and bleeding. Allergy:  receiving year round immunotherapy withgreat success.  Physical Exam  General:  Well-developed,well-nourished,in no acute distress; alert,appropriate and cooperative throughout examination HEENT: No facial asymmetry,  EOMI,no  sinus tenderness, TM's Clear, oropharynx  pink and moist.  CVS: S1, S2, No murmurs, No S3.   Abd: Soft, Nontender.  MS: Adequate ROM spine, hips, shoulders and knees.  Ext: No edema.   CNS: CN 2-12 intact, power tone and sensation normal throughout.   Skin: Intact, no visible lesions or rashes. icthaosis Psych: Good eye contact, normal affect.  Memory loss, short term, not anxious or depressed appearing.     Impression & Recommendations:  Problem # 1:  GENERALIZED ANXIETY DISORDER (ICD-300.02) Assessment Improved  Her updated medication list for this problem includes:    Clorazepate Dipotassium 7.5 Mg Tabs (Clorazepate dipotassium) ..... One half tab in am and one tab qhs    Buspar 5 Mg Tabs (Buspirone hcl) .Marland Kitchen... Take 1 tablet by mouth two times a day  Problem # 2:  ALLERGIC RHINITIS CAUSE UNSPECIFIED (ICD-477.9) Assessment: Unchanged  Orders: Admin of patients own med IM/SQ (18299B)  Problem # 3:  PALPITATIONS (ICD-785.1) Assessment: Improved  Her updated medication list for this problem includes:    Metoprolol Succinate 25 Mg Tb24 (Metoprolol succinate) .Marland Kitchen... 1/2 tab two times a day  Problem # 4:  TRANSIENT ISCHEMIC ATTACK (ICD-435.9) Assessment: Comment Only  The following medications were removed from the  medication list:    Bayer Aspirin 325 Mg Tabs (Aspirin) .Marland Kitchen... Take 1 tablet by mouth once a day Her updated medication list for this problem includes:    Aspir-low 81 Mg Tbec (Aspirin) .Marland Kitchen... Take 1 tablet by mouth once a day  Problem # 5:  PRESENILE DEMENTIA, UNCOMPLICATED (ICD-290.10) Assessment: Improved continue exelon  Problem # 6:  GERD (ICD-530.81) Assessment: Improved  Her updated medication list for this problem includes:    Nexium 40 Mg Cpdr (Esomeprazole magnesium) ..... One cap by mouth qd  Problem # 7:  HYPERLIPIDEMIA (ICD-272.4) Assessment: Unchanged  Her updated medication list for this problem includes:    Crestor 20 Mg Tabs (Rosuvastatin calcium) .Marland Kitchen... Take 1 tablet by mouth every other night at bedtime  Labs Reviewed: SGOT: 30 (11/07/2009)   SGPT: 23 (11/07/2009)   HDL:83 (11/07/2009), 78 (06/15/2009)  LDL:79 (11/07/2009), 82 (06/15/2009)  Chol:172 (11/07/2009), 170 (06/15/2009)  Trig:51 (11/07/2009), 49 (06/15/2009)  Complete Medication List: 1)  Advair Diskus 250-50  Mcg/dose Misc (Fluticasone-salmeterol) .Marland Kitchen.. 1puff and rinse, twice daily 2)  Allergy Vaccine 1-10 Gets At Pcp (w-e)  3)  Epipen 0.3 Mg/0.39ml Devi (Epinephrine) .... For severe allergic reaction 4)  Crestor 20 Mg Tabs (Rosuvastatin calcium) .... Take 1 tablet by mouth every other night at bedtime 5)  Metoprolol Succinate 25 Mg Tb24 (Metoprolol succinate) .... 1/2 tab two times a day 6)  Citracal Maximum 315-250 Mg-unit Tabs (Calcium citrate-vitamin d) .... Take 2 tablets daily 7)  Nexium 40 Mg Cpdr (Esomeprazole magnesium) .... One cap by mouth qd 8)  Miralax Powd (Polyethylene glycol 3350) .... Take 1/2 dose daily as needed 9)  Alendronate Sodium 70 Mg Tabs (Alendronate sodium) .... Take one tab once weekly 10)  Clorazepate Dipotassium 7.5 Mg Tabs (Clorazepate dipotassium) .... One half tab in am and one tab qhs 11)  Align Caps (Probiotic product) .... One tablet by mouth daily 12)  Exelon 9.5  Mg/24hr Pt24 (Rivastigmine) .... One patch qd 13)  Refresh Dry Eye Therapy 1-1 % Soln (Glycerin-polysorbate 80) .... 2 drops in each eye once daily 14)  Benefiber Pack (Guar gum) .... Take 2 teaspoons by mouth once dailay dissolved in at least 8 ounces water/juice 15)  Buspar 5 Mg Tabs (Buspirone hcl) .... Take 1 tablet by mouth two times a day 16)  Aspir-low 81 Mg Tbec (Aspirin) .... Take 1 tablet by mouth once a day  Patient Instructions: 1)  Please schedule a follow-up appointment in 4 months. 2)  your labs are great. 3)  You need to use themiralax and align on an as needed basis only 4)  you may try to stop the clorazepate, howeverif you have probs resting withoutit , pls resume. 5)  Cut the dose of asprin to 81 mg since there is no clear documentation of a stroke, though you did have a tIA in the fall of 2010 6)  keep active you are doing extremely well Prescriptions: ASPIR-LOW 81 MG TBEC (ASPIRIN) Take 1 tablet by mouth once a day  #30 x 5   Entered and Authorized by:   [name] MD   Signed by:   [name] MD on 11/09/2009   Method used:   Electronically to        [facility] [phone]* (retail)       [address]       [place], [place]  [address]       Ph: [phone] or [phone]       Fax: [phone]   RxID:   [id number] ADVAIR DISKUS 250-50 MCG/DOSE MISC (FLUTICASONE-SALMETEROL) 1puff and rinse, twice daily  #1 x 0   Entered by:   Everitt Amber LPN   Authorized by:   [name] MD   Signed by:   [name] MD on 11/09/2009   Method used:   Samples Given   RxID:   [id number]     Orders Added: 1)  Est. Patient Level IV [55732] 2)  Admin of patients own med IM/SQ [[id number]]

## 2010-08-08 NOTE — Progress Notes (Signed)
Summary: LIGHT BLOOD FRON BOWL  Phone Note Call from Patient   Summary of Call: HAS A HAST. IN [place] AND CAN NOT GET A HOLD OF THEM AND SHE HAS BEEN TAKING BENEFIBER AND BOWELS DO MOVE [place] LIKE AND HAS SEEN LIGHT RED BLOOD FROM HER BOWL MOVEMENT WHAT SHOULD SHE DO  A SUPPORITY OR WHAT CALL HER BACK PLEASE Initial call taken by: Lind Guest,  August 03, 2009 4:28 PM  Follow-up for Phone Call        advise ed if heavy bleeding, stat CBC this morning , ask her if she has been straining at stool or if she has hemmorhoids , ifknown hemmorhoids from pt histoey /colonscopy rept erx proctofoam hc 3 times daily as needed to rectum x 1 week qunt 30gm refill  x1  sh also needs to notify her GI office Follow-up by: [name] MD,  August 04, 2009 5:04 AM  Additional Follow-up for Phone Call Additional follow up Details #1::        patient states she called her gi doctor and they advised her what to do. states she hasnt seen anymore blood. Additional Follow-up by: Worthy Keeler LPN,  August 04, 2009 10:01 AM

## 2010-08-08 NOTE — Assessment & Plan Note (Signed)
Summary: 1 MONTH/APC   Copy to:  n/a Primary Provider/Referring Provider:  [name], MD   CC:  1 month follow  up visit-alleriges.  History of Present Illness:  11/23/08- Asthma, allergic rhinitis, asthma Continues allergy vaccine, getting her shots without problems at Dr Anthony Sar office. Last cxr in Nov was reviewed- scarring but no definitie nodule now and stable. Notes some hacky cough and yellow. Denies wheeze, nasal congestion, Bothersome mucus in throat on waking in AM. She likes Advair but we discussed the side effects as listed on tv. She finds one puff daily sufficient. Fluticasone nasal spray stopped because it burned her nose. Nasal congestion noted in early AM- wakes her. Wants sample alternative.  Nov 25, 2009- Asthma. allergic rhinitis.................daughter here 70 yo never smoker. Dr Teoh/ ENT had her doing saline nasal rinse which did help for awhile. Continues allergy vaccine at Dr Luther Parody' office 1:10. Nasal congestion lying down. Chest feels fine. Sometimes feels heart beat "heavy" at night. Denies nocturnal choking or smothering. Daughter notes supine sleep and snore.  December 29, 2009- Asthma, allergic rhinitis........................daughter here [place] on 11/26/09 had shown normal oxygenation on room air.  Says she hacks once a day or so,  but ran out of Advair.  Had gone to ER for vertigo and for palpitations- better since stopping Exelon patch. Dr Suszanne Conners treated with saline lavage for sinusitis. She continues to find allergy vaccine useful. She has not had reason to use a rescue inhaler.   Asthma History    Initial Asthma Severity Rating:    Age range: 12+ years    Symptoms: 0-2 days/week    Nighttime Awakenings: 0-2/month    Interferes w/ normal activity: no limitations    SABA use (not for EIB): 0-2 days/week    Asthma Severity Assessment: Intermittent   Preventive Screening-Counseling & Management  Alcohol-Tobacco     Smoking Status: never  Tobacco Counseling: not indicated; no tobacco use  Current Medications (verified): 1)  Advair Diskus 250-50 Mcg/dose Misc (Fluticasone-Salmeterol) .Marland Kitchen.. 1puff and Rinse, Twice Daily 2)  Allergy Vaccine 1-10 Gets At Pcp (W-E) 3)  Epipen 0.3 Mg/0.91ml Devi (Epinephrine) .... For Severe Allergic Reaction 4)  Crestor 20 Mg  Tabs (Rosuvastatin Calcium) .... Take 1 Tablet By Mouth Every Other Night At Bedtime 5)  Metoprolol Succinate 25 Mg  Tb24 (Metoprolol Succinate) .... 1/2 Tab Two Times A Day 6)  Citracal Maximum 315-250 Mg-Unit Tabs (Calcium Citrate-Vitamin D) .... Take 2 Tablets Daily 7)  Nexium 40 Mg Cpdr (Esomeprazole Magnesium) .... One Cap By Mouth Qd 8)  Miralax  Powd (Polyethylene Glycol 3350) .... Take 1/2 Dose Daily As Needed 9)  Alendronate Sodium 70 Mg Tabs (Alendronate Sodium) .... Take One Tab Once Weekly 10)  Clorazepate Dipotassium 7.5 Mg Tabs (Clorazepate Dipotassium) .... One Half Tab in Am and One Tab Qhs 11)  Align  Caps (Probiotic Product) .... One Tablet By Mouth Daily 12)  Exelon 9.5 Mg/24hr Pt24 (Rivastigmine) .... One Patch Qd 13)  Benefiber  Pack (Guar Gum) .... Take 2 Teaspoons By Mouth Once Dailay Dissolved in At Least 8 Ounces Water/juice 14)  Buspar 5 Mg Tabs (Buspirone Hcl) .... Take 1 Tablet By Mouth Two Times A Day 15)  Aspir-Low 81 Mg Tbec (Aspirin) .... Take 1 Tablet By Mouth Once A Day 16)  Meclizine Hcl 12.5 Mg Tabs (Meclizine Hcl) .... Take 1 Tablet By Mouth Three Times A Day As Needed For Dizziness  Allergies (verified): 1)  ! Benadryl  Past History:  Past  Medical History: Last updated: 11/10/2007 IBS ALLERGIES GENERALIZED ANXIETY DISORDER (ICD-300.02) OSTEOARTHRITIS (ICD-715.90) HYPERLIPIDEMIA (ICD-272.4) LUNG NODULE (ICD-518.89) Hx of ANGIOEDEMA (ICD-995.1) ALLERGIC RHINITIS (ICD-477.9) ASTHMA (ICD-493.90) CARPAL TUNNEL SYNDROME (ICD-354.0)  CHRONIC ABDOMINAL PAIN SECONDARY TO IBS, CONSTIPATION PREDOMINANT  Past Surgical History: Last  updated: 07/27/2009 HYSTERECTOMY fibroids 1979 COLONOSCOPY 2006 normal - redundant Thyroid biopsy 1970 Bilateral cateract surgery 2008  Family History: Last updated: 10/04/2008 THREE CHILDREN MOTHER DECEASED IN CHILDBIRTH/ HYPERTENSIVE FATHER DECEASED PANCREATIC CANCER THREE LIVING SIBLINGS HEALTHY SEVEN DECEASED / ONE LEUKEMIA / ONE PANCREATIC CANCER Family History of Colon Cancer:Brother  Social History: Last updated: 11/10/2007 [employer] Retired Never Smoked Alcohol use-no Drug use-no  Risk Factors: Smoking Status: never (12/29/2009)  Review of Systems      See HPI       The patient complains of shortness of breath with activity.  The patient denies shortness of breath at rest, productive cough, non-productive cough, coughing up blood, chest pain, irregular heartbeats, acid heartburn, indigestion, loss of appetite, weight change, abdominal pain, difficulty swallowing, sore throat, tooth/dental problems, headaches, nasal congestion/difficulty breathing through nose, sneezing, and fever.    Vital Signs:  Patient profile:   77 year old female Menstrual status:  hysterectomy Height:      65 inches Weight:      125 pounds BMI:     20.88 O2 Sat:      99 % on Room air Pulse rate:   57 / minute BP sitting:   110 / 70  (left arm) Cuff size:   regular  Vitals Entered By: Reynaldo Minium CMA (December 29, 2009 1:57 PM)  O2 Flow:  Room air CC: 1 month follow  up visit-alleriges   Physical Exam  Additional Exam:  General: A/Ox3; pleasant and cooperative, NAD, trim  SKIN: no rash, lesions NODES: no lymphadenopathy HEENT: [place]/AT, EOM- WNL, Conjuctivae- clear, PERRLA, TM-WNL, Nose- clear, Throat- clear and wnl, Mallampati  II, no erythema or drainage NECK: Supple w/ fair ROM, JVD- none, normal carotid impulses w/o bruits Thyroid-  CHEST: Clear to P&A, decreased but clear HEART: RRR, no m/g/r heard ABDOMEN: flat ZOX:WRUE, nl pulses, no edema  NEURO: Grossly intact to  observation      Impression & Recommendations:  Problem # 1:  ALLERGIC RHINITIS CAUSE UNSPECIFIED (ICD-477.9)  Allergy vaccine is still helpful and she is comfortable now.We discussed significance of atopy at her age, and conservative management including dust precautions. She is most comfortable conitinuing as she is doing.  Problem # 2:  ASTHMA (ICD-493.90) We spent time on the issue of a rescue inhaler and decided to leave it available on her med list to obtain later if she ever did need it. We will refill Advair which has been sufficient most of the time.  Problem # 3:  LUNG NODULE (ICD-518.89) last CXR in 2009 showed no nodule. She never smoked, so risk is lower. We will watch with cxr at long inrtervals.  Medications Added to Medication List This Visit: 1)  Alendronate Sodium 70 Mg Tabs (Alendronate sodium) .... Take one tab once weekly 2)  Meclizine Hcl 12.5 Mg Tabs (Meclizine hcl) .... Take 1 tablet by mouth three times a day as needed for dizziness 3)  Proair Hfa 108 (90 Base) Mcg/act Aers (Albuterol sulfate) .... 2 puffs qid as needed  Other Orders: Est. Patient Level IV (45409)  Patient Instructions: 1)  Please schedule a follow-up appointment in 6 months. 2)  Advair script is refilled at your drug store Prescriptions: ADVAIR DISKUS 250-50  MCG/DOSE MISC (FLUTICASONE-SALMETEROL) 1puff and rinse, twice daily  #1 x prn   Entered and Authorized by:   Waymon Budge MD   Signed by:   Waymon Budge MD on 12/29/2009   Method used:   Handwritten   RxID:   [id number] PROAIR HFA 108 (90 BASE) MCG/ACT AERS (ALBUTEROL SULFATE) 2 puffs qid as needed  #1 x prn   Entered and Authorized by:   Waymon Budge MD   Signed by:   Waymon Budge MD on 12/29/2009   Method used:   Handwritten   RxID:   [id number]

## 2010-08-08 NOTE — Progress Notes (Signed)
Summary: speak with nurse  Phone Note Call from Patient   Summary of Call: pt would like to speak with doc about meds. [phone]    Initial call taken by: Rudene Anda,  December 12, 2009 10:20 AM  Follow-up for Phone Call        patient needs refills on all meds  Rx Called In Follow-up by: Adella Hare LPN,  December 13, 4538 11:38 AM    Prescriptions: CLORAZEPATE DIPOTASSIUM 7.5 MG TABS (CLORAZEPATE DIPOTASSIUM) one half tab in am and one tab qhs  #45 x 3   Entered by:   Adella Hare LPN   Authorized by:   [name] MD   Signed by:   Adella Hare LPN on 98/05/9146   Method used:   Printed then faxed to ...       [address] [phone]* (retail)       [address]       [place], [place]  [address]       Ph: [phone] or [phone]       Fax: [phone]   RxID:   [id number] ASPIR-LOW 81 MG TBEC (ASPIRIN) Take 1 tablet by mouth once a day  #30 x 3   Entered by:   Adella Hare LPN   Authorized by:   [name] MD   Signed by:   Adella Hare LPN on 64/33/2951   Method used:   Electronically to        [facility] [phone]* (retail)       [address]       [place], [place]  [address]       Ph: [phone] or [phone]       Fax: [phone]   RxID:   [id number] BUSPAR 5 MG TABS (BUSPIRONE HCL) Take 1 tablet by mouth two times a day  #60 x 3   Entered by:   Adella Hare LPN   Authorized by:   [name] MD   Signed by:   Adella Hare LPN on 69/48/5462   Method used:   Electronically to        [facility] [phone]* (retail)       [address]       [place], [place]  [address]       Ph: [phone] or [phone]       Fax: [phone]   RxID:   [id number] EXELON 9.5 MG/24HR PT24 (RIVASTIGMINE) one patch qd  #30 x 3   Entered by:   Adella Hare LPN   Authorized by:   [name] MD   Signed by:   Adella Hare LPN on 08/67/6195   Method used:   Electronically to        [facility] [phone]* (retail)       [address]       [place], [place]  [address]       Ph: [phone] or [phone]       Fax: [phone]   RxID:   [id number] ALENDRONATE SODIUM 70 MG TABS (ALENDRONATE SODIUM) Take one tab once weekly  #4 Tablet x 3   Entered by:   Adella Hare LPN   Authorized by:   [name] MD   Signed by:   Adella Hare LPN on 62/22/9798   Method used:  Electronically to        [facility] [phone]* (retail)       [address]       [place], [place]  [address]       Ph: [phone] or [phone]       Fax: [phone]   RxID:   [id number] NEXIUM 40 MG CPDR (ESOMEPRAZOLE MAGNESIUM) one cap by mouth qd  #30 x 3   Entered by:   Adella Hare LPN   Authorized by:   [name] MD   Signed by:   Adella Hare LPN on 40/34/7425   Method used:   Electronically to        [facility] [phone]* (retail)       [address]       [place], [place]  [address]       Ph: [phone] or [phone]       Fax: [phone]   RxID:   [id number] CITRACAL MAXIMUM 315-250 MG-UNIT TABS (CALCIUM CITRATE-VITAMIN D) Take 2 tablets daily  #60 x 3   Entered by:   Adella Hare LPN   Authorized by:   [name] MD   Signed by:   Adella Hare LPN on 15/17/6160   Method used:   Electronically to        [facility] [phone]* (retail)       [address]       [place], [place]  [address]       Ph: [phone] or [phone]       Fax: [phone]   RxID:   [id number] METOPROLOL SUCCINATE 25 MG  TB24 (METOPROLOL SUCCINATE) 1/2 tab two times a day  #30 x 3   Entered by:   Adella Hare LPN   Authorized by:   [name] MD   Signed by:   Adella Hare LPN on 24/23/5361   Method used:   Electronically to        [facility] [phone]* (retail)       [address]       [place], [place]  [address]       Ph: [phone] or [phone]       Fax:  [phone]   RxID:   [id number] CRESTOR 20 MG  TABS (ROSUVASTATIN CALCIUM) Take 1 tablet by mouth every other night at bedtime  #30 x 3   Entered by:   Adella Hare LPN   Authorized by:   [name] MD   Signed by:   Adella Hare LPN on 35/32/9924   Method used:   Electronically to        [facility] [phone]* (retail)       [address]       [place], [place]  [address]       Ph: [phone] or [phone]       Fax: [phone]   RxID:   [id number]

## 2010-08-08 NOTE — Assessment & Plan Note (Signed)
Summary: INJECTION  Nurse Visit   Vital Signs:  Patient profile:   77 year old female Menstrual status:  hysterectomy Height:      65 inches Weight:      129.50 pounds BMI:     21.63 O2 Sat:      97 % Pulse rate:   87 / minute Resp:     16 per minute  Vitals Entered By: Everitt Amber LPN (August 31, 2009 11:45 AM) Comments In for weekly allergy injection   Allergies: 1)  ! Benadryl  Orders Added: 1)  Admin of patients own med IM/SQ [96372M]   Orders Added: 1)  Admin of patients own med IM/SQ [facility]

## 2010-08-08 NOTE — Assessment & Plan Note (Signed)
Summary: allergy shot  Nurse Visit   Vital Signs:  Patient profile:   77 year old female Menstrual status:  hysterectomy Height:      65 inches Weight:      128 pounds BMI:     21.38 O2 Sat:      100 % on Room air Pulse rate:   61 / minute Resp:     16 per minute BP sitting:   118 / 70  (left arm)  Vitals Entered By: Adella Hare LPN (September 29, 2009 3:34 PM)  O2 Flow:  Room air Comments patient in for weekly allergy injection .5cc in left arm subcutaneously patient tolerated well Adella Hare LPN  September 29, 2009 3:35 PM    Allergies: 1)  ! Benadryl  Orders Added: 1)  Admin of Therapeutic Inj  intramuscular or subcutaneous [96372]   Orders Added: 1)  Admin of Therapeutic Inj  intramuscular or subcutaneous [36644]

## 2010-08-08 NOTE — Progress Notes (Signed)
Summary: sinus problem LMTCB x1  Phone Note Call from Patient Call back at Home Phone [phone]   Caller: Patient Call For: young Summary of Call: pt would like to talk to dr young about some sinus issues she is having Initial call taken by: Rickard Patience,  August 10, 2009 1:07 PM  Follow-up for Phone Call        called spoke with pt's daughter Aram Beecham.  she states that pt c/o incrased nasal drainage occ bloody and PND x2-84months and has some questions concerning this: is this exacerbation from pt taking too many meds or the change of seasons?, could the bloody drainage be from pt taking 325mg  asa?, and who does CDY recommend for an ENT?  please advise, thanks!  last OV w/ CDY 11/2008, no upcoming. Follow-up by: Boone Master CNA,  August 10, 2009 3:07 PM  Additional Follow-up for Phone Call Additional follow up Details #1::        1) This could be aspirin plus rhinitis or a sinus infection.  2) Neti pot otc might help. 3) Would be considering CT scan of sinuses, probably trial of antibiotic. 4) [facility] ENT docs are good- [place], Radene Journey, Annalee Genta.  Others also- Gregary Cromer. Additional Follow-up by: Waymon Budge MD,  August 11, 2009 12:59 PM    Additional Follow-up for Phone Call Additional follow up Details #2::    [facility] Gweneth Dimitri RN  August 11, 2009 3:16 PM  pt's daughter returned call to triage.  Follow-up by: Eugene Gavia,  August 12, 2009 10:58 AM  Additional Follow-up for Phone Call Additional follow up Details #3:: Details for Additional Follow-up Action Taken: daughter advised of recs. She is going to discuss options with the patient and call back with a decision on next step.Carron Curie CMA  August 12, 2009 11:31 AM

## 2010-08-08 NOTE — Progress Notes (Signed)
Summary: ENT referral  Phone Note Call from Patient   Caller: Daughter Call For: Kathleen Gallegos Summary of Call: pt's daughter returned call to nurse. pt wants referral to [facility] ENT- Dr. Christain [place]. daughter cynthia's # [phone] or cell [phone] Initial call taken by: Tivis Ringer, CNA,  August 12, 2009 11:44 AM  Follow-up for Phone Call        order palced for referral sent to [facility]. Carron Curie CMA  August 12, 2009 12:04 PM

## 2010-08-08 NOTE — Assessment & Plan Note (Signed)
Summary: inj  Nurse Visit   Vital Signs:  Patient profile:   77 year old female Menstrual status:  hysterectomy Height:      65 inches Weight:      123 pounds BMI:     20.54 O2 Sat:      98 % on Room air Pulse rate:   75 / minute Resp:     16 per minute  Vitals Entered By: Everitt Amber LPN (December 07, 1608 11:27 AM)  O2 Flow:  Room air CC: Patient in to get weekly allergy injection   Allergies: 1)  ! Benadryl  Orders Added: 1)  Admin of patients own med IM/SQ [96372M]   Orders Added: 1)  Admin of patients own med IM/SQ [facility]

## 2010-08-08 NOTE — Assessment & Plan Note (Signed)
Summary: INJ  Nurse Visit   Vital Signs:  Patient profile:   77 year old female Menstrual status:  hysterectomy Height:      65 inches Weight:      128.75 pounds BP sitting:   118 / 70  (left arm)  Vitals Entered By: Worthy Keeler LPN (August 18, 2009 4:42 PM) Comments patient in for weekly allergy injection .5cc subcutaneously in left arm tolerated well Worthy Keeler LPN  August 18, 2009 4:50 PM    Allergies: 1)  ! Benadryl  Orders Added: 1)  Admin of patients own med IM/SQ [96372M]   Orders Added: 1)  Admin of patients own med IM/SQ [facility]

## 2010-08-08 NOTE — Assessment & Plan Note (Signed)
Summary: inj  Nurse Visit   Vital Signs:  Patient profile:   77 year old female Menstrual status:  hysterectomy Height:      65 inches Weight:      124.50 pounds BMI:     20.79 O2 Sat:      98 % Pulse rate:   74 / minute Resp:     16 per minute BP sitting:   110 / 74  (left arm) Cuff size:   regular  Vitals Entered By: Everitt Amber LPN (November 03, 2009 2:47 PM) CC: Patient in to have weekly allergy injection    Allergies: 1)  ! Benadryl  Orders Added: 1)  Admin of patients own med IM/SQ [96372M]   Orders Added: 1)  Admin of patients own med IM/SQ [facility]

## 2010-08-08 NOTE — Procedures (Signed)
Summary: Overnight Oximetry/[facility]  Overnight Oximetry/[facility]   Imported By: Sherian Rein 12/12/2009 10:41:13  _____________________________________________________________________  External Attachment:    Type:   Image     Comment:   External Document

## 2010-08-08 NOTE — Assessment & Plan Note (Signed)
Summary: inj  Nurse Visit   Vital Signs:  Patient profile:   77 year old female Menstrual status:  hysterectomy Height:      65 inches Weight:      127.25 pounds Resp:     16 per minute BP sitting:   120 / 70  (left arm)  Vitals Entered By: Mauricia Area CMA (May 22, 2010 10:21 AM)  Allergies: 1)  ! Benadryl 2)  ! Exelon (Rivastigmine)  Orders Added: 1)  Admin of patients own med IM/SQ [96372M] ptreceived immunotherapy with no adverse reactions

## 2010-08-08 NOTE — Assessment & Plan Note (Signed)
Summary: allergy shot  Nurse Visit   Vital Signs:  Patient profile:   77 year old female Menstrual status:  hysterectomy Height:      65 inches Weight:      128 pounds BMI:     21.38 O2 Sat:      98 % Pulse rate:   75 / minute Resp:     16 per minute  Vitals Entered By: Adella Hare LPN (February 16, 2010 4:36 PM) CC: In for weekly allergy injection    Allergies: 1)  ! Benadryl 2)  ! Exelon (Rivastigmine)  Orders Added: 1)  Admin of patients own med IM/SQ [96372M]   Orders Added: 1)  Admin of patients own med IM/SQ [16109U]  Appended Document: allergy shot pt received medication with no adverse reaction

## 2010-08-08 NOTE — Assessment & Plan Note (Signed)
Summary: INJ  Nurse Visit   Vital Signs:  Patient profile:   77 year old female Menstrual status:  hysterectomy Height:      65 inches Weight:      127.25 pounds BMI:     21.25 O2 Sat:      98 % Pulse rate:   65 / minute Resp:     16 per minute BP sitting:   100 / 60 Cuff size:   regular  Vitals Entered By: Everitt Amber LPN (August 25, 2009 11:07 AM) CC: patient here for weekly allergy injection   Allergies: 1)  ! Benadryl  Orders Added: 1)  Admin of patients own med IM/SQ [96372M]   Orders Added: 1)  Admin of patients own med IM/SQ [facility]

## 2010-08-08 NOTE — Assessment & Plan Note (Signed)
Summary: INJ  Nurse Visit  Comments PATIENT RECIEVED 0.5CC OF ALLERGY MED IN LEFT ARM  TOLERATED WELL Adella Hare LPN  May 02, 2010 4:35 PM    Allergies: 1)  ! Benadryl 2)  ! Exelon (Rivastigmine)  Orders Added: 1)  Admin of patients own med IM/SQ [96372M]   Orders Added: 1)  Admin of patients own med IM/SQ [[id number]]

## 2010-08-08 NOTE — Miscellaneous (Signed)
Summary: Injection Orders / [facility]    Injection Orders / [facility]    Imported By: Lennie Odor 12/06/2009 15:12:20  _____________________________________________________________________  External Attachment:    Type:   Image     Comment:   External Document

## 2010-08-08 NOTE — Progress Notes (Signed)
Summary: DR. Janeece Riggers TEAH  DR. Janeece Riggers TEAH   Imported By: Lind Guest 08/23/2009 10:15:41  _____________________________________________________________________  External Attachment:    Type:   Image     Comment:   External Document

## 2010-08-08 NOTE — Assessment & Plan Note (Signed)
Summary: INJ  Nurse Visit   Vital Signs:  Patient profile:   78 year old female Menstrual status:  hysterectomy Height:      65 inches Weight:      128 pounds BP sitting:   120 / 80  (left arm)  Vitals Entered By: Worthy Keeler LPN (August 04, 2009 2:53 PM) Comments patient in for weekly allergy injection .4cc subcutaneously in left arm tolerated well Worthy Keeler LPN  August 04, 2009 2:54 PM    Allergies: 1)  ! Benadryl  Orders Added: 1)  Admin of patients own med IM/SQ [96372M]   Orders Added: 1)  Admin of patients own med IM/SQ [facility]

## 2010-08-08 NOTE — Progress Notes (Signed)
  Phone Note Call from Patient   Summary of Call: Kathleen Gallegos wants to know if you want her on the 325mg  ASA or the 81mg ? Initial call taken by: Everitt Amber LPN,  December 13, 2723 11:44 AM  Follow-up for Phone Call        pls advise 81mg  like we discused before, and this is on her med list, tell her to check this Follow-up by: [name] MD,  December 12, 2009 1:02 PM  Additional Follow-up for Phone Call Additional follow up Details #1::        WIll make patient aware Additional Follow-up by: Everitt Amber LPN,  December 13, 3662 1:07 PM

## 2010-08-08 NOTE — Progress Notes (Signed)
Summary: triage  Phone Note Call from Patient Call back at Home Phone [phone]   Caller: Patient Call For: Leone Payor Reason for Call: Talk to Nurse Summary of Call: Patient has questions regarding her benefiber (wants to know if she can up her dosage) and has blood in the tissue but is a little concerned because she has never seen any blood before on her bottom.  Initial call taken by: Tawni Levy,  August 03, 2009 4:32 PM  Follow-up for Phone Call        Pt.not taking her Miralax daily is just using as needed.Pt.instructed to start using Miralax in am and pm and  take Benefiber in the afternoon as she is still counting the pebble stools everyday.Explained how to titrate Miralax.Daughter was also on the phone and said pt. does not want to follow Dr's orders and  chooses to follow her own bowel regime.Re-inforced the importance of following Dr's orders so she can feel better. Follow-up by: Teryl Lucy RN,  August 03, 2009 5:01 PM

## 2010-08-08 NOTE — Progress Notes (Signed)
  Phone Note Call from Patient   Summary of Call: She is still having the problems with the gas everynight balling up in her stomach. The er told her that the exelon patch and the alendronate taken together can cause stomach problems. Wants to know if she can come off of them for a period of time to see if it helps her stomach because it is going on in the middle of the night everynight and it wakes her up. Heart rate increased and gas knots in the stomach.  Initial call taken by: Everitt Amber LPN,  December 22, 2009 10:12 AM  Follow-up for Phone Call        yesshe can try to come off both of them and see if this helps her symptoms, no need to taper off either, Follow-up by: [name] MD,  December 22, 2009 5:30 PM  Additional Follow-up for Phone Call Additional follow up Details #1::        Patient aware Additional Follow-up by: Everitt Amber LPN,  December 23, 2009 3:55 PM

## 2010-08-08 NOTE — Progress Notes (Signed)
Summary: dr.teoh  dr.teoh   Imported By: Lind Guest 12/13/2009 08:41:00  _____________________________________________________________________  External Attachment:    Type:   Image     Comment:   External Document

## 2010-08-08 NOTE — Progress Notes (Signed)
  Phone Note Other Incoming   Caller: dr simpson Summary of Call: pls contact this pt  and see if she isinterested in having a referral for a caregiver assistnce since she has memory impairment, and refer to ellen if she wants Initial call taken by: [name] MD,  October 11, 2009 7:13 PM  Follow-up for Phone Call        [facility] is for people giving care to someone with dementia. She would not qualify since she lives alone and has no direct caregiver Follow-up by: Everitt Amber LPN,  October 12, 2009 8:30 AM

## 2010-08-08 NOTE — Progress Notes (Signed)
[facility]   Imported By: Lind Guest 09/02/2009 09:51:36  _____________________________________________________________________  External Attachment:    Type:   Image     Comment:   External Document

## 2010-08-08 NOTE — Assessment & Plan Note (Signed)
Summary: F UP FROM ED   Vital Signs:  Patient profile:   77 year old female Menstrual status:  hysterectomy Height:      65 inches Weight:      124.75 pounds BMI:     20.83 O2 Sat:      97 % Pulse rate:   66 / minute Pulse rhythm:   regular Resp:     16 per minute BP sitting:   110 / 70  (left arm)  Vitals Entered By: Everitt Amber LPN (Nov 28, 2009 3:45 PM) CC: still having the dizziness off and on, mostly in the morning. Also has some sinus drainage. Has not been getting alot of sleep at night   Primary Care Provider:  [name], MD   CC:  still having the dizziness off and on and mostly in the morning. Also has some sinus drainage. Has not been getting alot of sleep at night.  History of Present Illness: Seemn in the ed last Friday because of dizziness which had progressivekly worsened . Recently she  had discontinued chlorazepate , SINCE THEN SHE STATES HER SLEEP IS NOT AS GOOD AS IN THE PAST.Marland Kitchen She did see Dr Maple Hudson on Friday, he requested overnight pulse ox and thought he was more light headed. She feels as though her sleep is  much less since being off chlorazepate.  She was dx with vertigo, but has not filled the meclizine , her symptoms have improved, and the major underlying prob seems to be sleep deprivation and after diss cussion I havd advised that she resume he chlorazepate at the lowest effective dose. She deniesany recent fever or chills, chest congestion with productive cough, she does have chronic sinus drainage with some pressure. She remains on chronic immunotherapy.  Current Medications (verified): 1)  Advair Diskus 250-50 Mcg/dose Misc (Fluticasone-Salmeterol) .Marland Kitchen.. 1puff and Rinse, Twice Daily 2)  Allergy Vaccine 1-10 Gets At Pcp (W-E) 3)  Epipen 0.3 Mg/0.41ml Devi (Epinephrine) .... For Severe Allergic Reaction 4)  Crestor 20 Mg  Tabs (Rosuvastatin Calcium) .... Take 1 Tablet By Mouth Every Other Night At Bedtime 5)  Metoprolol Succinate 25 Mg  Tb24  (Metoprolol Succinate) .... 1/2 Tab Two Times A Day 6)  Citracal Maximum 315-250 Mg-Unit Tabs (Calcium Citrate-Vitamin D) .... Take 2 Tablets Daily 7)  Nexium 40 Mg Cpdr (Esomeprazole Magnesium) .... One Cap By Mouth Qd 8)  Miralax  Powd (Polyethylene Glycol 3350) .... Take 1/2 Dose Daily As Needed 9)  Alendronate Sodium 70 Mg Tabs (Alendronate Sodium) .... Take One Tab Once Weekly 10)  Clorazepate Dipotassium 7.5 Mg Tabs (Clorazepate Dipotassium) .... One Half Tab in Am and One Tab Qhs 11)  Align  Caps (Probiotic Product) .... One Tablet By Mouth Daily 12)  Exelon 9.5 Mg/24hr Pt24 (Rivastigmine) .... One Patch Qd 13)  Refresh Dry Eye Therapy 1-1 % Soln (Glycerin-Polysorbate 80) .... 2 Drops in Each Eye Once Daily 14)  Benefiber  Pack (Guar Gum) .... Take 2 Teaspoons By Mouth Once Dailay Dissolved in At Least 8 Ounces Water/juice 15)  Buspar 5 Mg Tabs (Buspirone Hcl) .... Take 1 Tablet By Mouth Two Times A Day 16)  Aspir-Low 81 Mg Tbec (Aspirin) .... Take 1 Tablet By Mouth Once A Day  Allergies (verified): 1)  ! Benadryl  Review of Systems      See HPI General:  Complains of fatigue and sleep disorder. Eyes:  Denies blurring, discharge, and red eye. ENT:  Complains of postnasal drainage and sinus pressure. CV:  Complains of lightheadness; denies difficulty breathing while lying down, near fainting, palpitations, shortness of breath with exertion, and swelling of feet. Resp:  Denies cough and sputum productive. GI:  Complains of abdominal pain and constipation; denies change in bowel habits, indigestion, nausea, and vomiting; chronic epigastric and LUQ pain with constipation due to IBS. GU:  Denies dysuria and urinary frequency. MS:  Complains of joint pain and muscle weakness. Derm:  Complains of dryness; denies itching, lesion(s), and rash; CHRONIC DRY SKIN. Neuro:  Complains of sensation of room spinning. Psych:  Complains of anxiety; denies depression, easily tearful, irritability,  mental problems, suicidal thoughts/plans, thoughts of violence, and unusual visions or sounds; improved significantly with buspar. Endo:  Denies excessive thirst and excessive urination. Heme:  Denies abnormal bruising and bleeding. Allergy:  perrenial allergies on immunotherPY.  Physical Exam  General:  Well-developed,well-nourished,in no acute distress; alert,appropriate and cooperative throughout examination HEENT: No facial asymmetry,  EOMI, frontal sinus tenderness, TM's Clear, oropharynx  pink and moist.   Chest: Clear to auscultation bilaterally.  CVS: S1, S2, No murmurs, No S3.   Abd: Soft, Nontender.  MS: decreased  ROM spine, hips, shoulders and knees.  Ext: No edema.   CNS: CN 2-12 intact, power tone and sensation normal throughout.   Skin: Intact, no visible lesions or rashes.  Psych: Good eye contact, normal affect.  Memory LOSSt, not anxious or depressed appearing.    Impression & Recommendations:  Problem # 1:  UNSPECIFIED PERIPHERAL VERTIGO (ICD-386.10) Assessment Deteriorated  Future Orders: ENT Referral (ENT) ... 11/29/2009, pt encouraged to fill the rx given in the ed for antivert  Problem # 2:  GENERALIZED ANXIETY DISORDER (ICD-300.02) Assessment: Improved  Her updated medication list for this problem includes:    Clorazepate Dipotassium 7.5 Mg Tabs (Clorazepate dipotassium) ..... One half tab in am and one tab qhs    Buspar 5 Mg Tabs (Buspirone hcl) .Marland Kitchen... Take 1 tablet by mouth two times a day  Problem # 3:  PRESENILE DEMENTIA, UNCOMPLICATED (ICD-290.10) Assessment: Improved continue aricept  Problem # 4:  HYPERLIPIDEMIA (ICD-272.4) Assessment: Improved  Her updated medication list for this problem includes:    Crestor 20 Mg Tabs (Rosuvastatin calcium) .Marland Kitchen... Take 1 tablet by mouth every other night at bedtime  Labs Reviewed: SGOT: 30 (11/07/2009)   SGPT: 23 (11/07/2009)   HDL:83 (11/07/2009), 78 (06/15/2009)  LDL:79 (11/07/2009), 82 (06/15/2009)   Chol:172 (11/07/2009), 170 (06/15/2009)  Trig:51 (11/07/2009), 49 (06/15/2009)  Problem # 5:  ACUTE SINUSITIS, UNSPECIFIED (ICD-461.9) Assessment: Comment Only  Her updated medication list for this problem includes:    Penicillin V Potassium 500 Mg Tabs (Penicillin v potassium) .Marland Kitchen... Take 1 tablet by mouth three times a day  Problem # 6:  GERD (ICD-530.81) Assessment: Unchanged  Her updated medication list for this problem includes:    Nexium 40 Mg Cpdr (Esomeprazole magnesium) ..... One cap by mouth qd  Problem # 7:  INSOMNIA (ICD-780.52) Assessment: Deteriorated  Discussed sleep hygiene. resume chlorazepate at lowest possible dose needed  Complete Medication List: 1)  Advair Diskus 250-50 Mcg/dose Misc (Fluticasone-salmeterol) .Marland Kitchen.. 1puff and rinse, twice daily 2)  Allergy Vaccine 1-10 Gets At Pcp (w-e)  3)  Epipen 0.3 Mg/0.28ml Devi (Epinephrine) .... For severe allergic reaction 4)  Crestor 20 Mg Tabs (Rosuvastatin calcium) .... Take 1 tablet by mouth every other night at bedtime 5)  Metoprolol Succinate 25 Mg Tb24 (Metoprolol succinate) .... 1/2 tab two times a day 6)  Citracal Maximum 315-250 Mg-unit Tabs (Calcium  citrate-vitamin d) .... Take 2 tablets daily 7)  Nexium 40 Mg Cpdr (Esomeprazole magnesium) .... One cap by mouth qd 8)  Miralax Powd (Polyethylene glycol 3350) .... Take 1/2 dose daily as needed 9)  Alendronate Sodium 70 Mg Tabs (Alendronate sodium) .... Take one tab once weekly 10)  Clorazepate Dipotassium 7.5 Mg Tabs (Clorazepate dipotassium) .... One half tab in am and one tab qhs 11)  Align Caps (Probiotic product) .... One tablet by mouth daily 12)  Exelon 9.5 Mg/24hr Pt24 (Rivastigmine) .... One patch qd 13)  Refresh Dry Eye Therapy 1-1 % Soln (Glycerin-polysorbate 80) .... 2 drops in each eye once daily 14)  Benefiber Pack (Guar gum) .... Take 2 teaspoons by mouth once dailay dissolved in at least 8 ounces water/juice 15)  Buspar 5 Mg Tabs (Buspirone hcl) ....  Take 1 tablet by mouth two times a day 16)  Aspir-low 81 Mg Tbec (Aspirin) .... Take 1 tablet by mouth once a day 17)  Penicillin V Potassium 500 Mg Tabs (Penicillin v potassium) .... Take 1 tablet by mouth three times a day 18)  Meclizine Hcl 12.5 Mg Tabs (Meclizine hcl) .... Take 1 tablet by mouth three times a day as needed  Patient Instructions: 1)  F/U in 5 weeks. 2)  pls  resume chlorazepate HALF at bedtime, if your sleep is insufficient on this then increas to one at bedtime. 3)  I also suggest that you return to ENT  for re eval, also i WILL SEND IN A SCRIPT FOR PENICILLIN  FOR 1 WEEK. Prescriptions: MECLIZINE HCL 12.5 MG TABS (MECLIZINE HCL) Take 1 tablet by mouth three times a day as needed  #30 x 0   Entered and Authorized by:   [name] MD   Signed by:   [name] MD on 11/28/2009   Method used:   Electronically to        [facility] [phone]* (retail)       [address]       [place], [place]  [address]       Ph: [phone] or [phone]       Fax: [phone]   RxID:   [id number] PENICILLIN V POTASSIUM 500 MG TABS (PENICILLIN V POTASSIUM) Take 1 tablet by mouth three times a day  #30 x 0   Entered and Authorized by:   [name] MD   Signed by:   [name] MD on 11/28/2009   Method used:   Electronically to        [facility] [phone]* (retail)       [address]       [place], [place]  [address]       Ph: [phone] or [phone]       Fax: [phone]   RxID:   [id number]

## 2010-08-08 NOTE — Assessment & Plan Note (Signed)
Summary: INJ  Nurse Visit   Vital Signs:  Patient profile:   77 year old female Menstrual status:  hysterectomy Height:      65 inches Weight:      125 pounds BMI:     20.88 O2 Sat:      98 % Pulse rate:   61 / minute Resp:     16 per minute  Vitals Entered By: Everitt Amber LPN (December 29, 2009 11:46 AM) CC: Patient in to get weekly allergy injection    Allergies: 1)  ! Benadryl  Orders Added: 1)  Admin of patients own med IM/SQ [96372M]   Orders Added: 1)  Admin of patients own med IM/SQ [facility]

## 2010-08-08 NOTE — Assessment & Plan Note (Signed)
Summary: inj  Nurse Visit   Vital Signs:  Patient profile:   77 year old female Menstrual status:  hysterectomy Height:      65 inches Weight:      124.25 pounds BMI:     20.75 O2 Sat:      98 % on Room air Pulse rate:   87 / minute Resp:     16 per minute BP sitting:   120 / 74  (left arm) Cuff size:   regular  Vitals Entered By: Everitt Amber LPN (Nov 23, 2009 2:20 PM)  O2 Flow:  Room air CC: Patient to get allergy injection    Allergies: 1)  ! Benadryl  Orders Added: 1)  Admin of patients own med IM/SQ [96372M]   Orders Added: 1)  Admin of patients own med IM/SQ [facility]

## 2010-08-08 NOTE — Assessment & Plan Note (Signed)
Summary: INJ  Nurse Visit   Vital Signs:  Patient profile:   77 year old female Menstrual status:  hysterectomy Height:      65 inches Weight:      122.75 pounds BMI:     20.50 O2 Sat:      97 % Pulse rate:   84 / minute Resp:     16 per minute  Vitals Entered By: Everitt Amber LPN (Dec 02, 2009 1:37 PM) CC: Patient here to get weekly allergy injection   Allergies: 1)  ! Benadryl  Orders Added: 1)  Admin of patients own med IM/SQ [96372M]   Orders Added: 1)  Admin of patients own med IM/SQ [facility]

## 2010-08-08 NOTE — Assessment & Plan Note (Signed)
Summary: INJ  Nurse Visit   Vital Signs:  Patient profile:   77 year old female Menstrual status:  hysterectomy Height:      65 inches Weight:      128 pounds BMI:     21.38 O2 Sat:      98 % on Room air Pulse rate:   72 / minute Resp:     16 per minute  Vitals Entered By: Everitt Amber LPN (April 07, 2010 11:29 AM)  O2 Flow:  Room air CC: Patient here to get weekly allergy injection    Allergies: 1)  ! Benadryl 2)  ! Exelon (Rivastigmine)  Orders Added: 1)  Admin of patients own med IM/SQ [96372M]   Orders Added: 1)  Admin of patients own med IM/SQ [[id number]]

## 2010-08-08 NOTE — Assessment & Plan Note (Signed)
Summary: ALLERGY SHOT/SLJ  Nurse Visit   Vital Signs:  Patient profile:   77 year old female Menstrual status:  hysterectomy Height:      65 inches Weight:      126.25 pounds BMI:     21.09 O2 Sat:      98 % Pulse rate:   62 / minute Resp:     16 per minute  Vitals Entered By: Everitt Amber LPN (February 13, 2010 7:56 AM) CC: Patient in to get allergy injection    Allergies: 1)  ! Benadryl 2)  ! Exelon (Rivastigmine)  Orders Added: 1)  Admin of patients own med IM/SQ [96372M]   Orders Added: 1)  Admin of patients own med IM/SQ [[id number]]

## 2010-08-08 NOTE — Assessment & Plan Note (Signed)
Summary: INJ  Nurse Visit   Allergies: 1)  ! Benadryl 2)  ! Exelon (Rivastigmine)  Orders Added: 1)  Admin of patients own med IM/SQ [96372M]   Orders Added: 1)  Admin of patients own med IM/SQ [[id number]]  .5cc of allergy med given in left arm subcutaneously patient tolerated well Adella Hare LPN  April 25, 2010 11:54 AM

## 2010-08-08 NOTE — Assessment & Plan Note (Signed)
Summary: inj  Nurse Visit   Vital Signs:  Patient profile:   77 year old female Menstrual status:  hysterectomy Height:      65 inches Weight:      127.75 pounds BMI:     21.34 O2 Sat:      98 % on Room air Pulse rate:   77 / minute Resp:     16 per minute  Vitals Entered By: Everitt Amber LPN (Nov 17, 2009 4:05 PM)  O2 Flow:  Room air CC: In for weekly allergy injection    Allergies: 1)  ! Benadryl  Orders Added: 1)  Admin of patients own med IM/SQ [96372M]   Orders Added: 1)  Admin of patients own med IM/SQ [facility]

## 2010-08-08 NOTE — Assessment & Plan Note (Signed)
Summary: F UP   Vital Signs:  Patient profile:   77 year old female Menstrual status:  hysterectomy Height:      65 inches Weight:      128.75 pounds BMI:     21.50 O2 Sat:      97 % Pulse rate:   63 / minute Pulse rhythm:   regular Resp:     16 per minute BP sitting:   120 / 80  (left arm) Cuff size:   regular  Vitals Entered By: Everitt Amber LPN (March 09, 2010 1:01 PM) CC: Follow up chronic problems   Primary Care Provider:  [name], MD   CC:  Follow up chronic problems.  History of Present Illness: Right hip pain  intermittently, but significantly less since she has been having more regular bowel movememnts. Reports  that  generally she is doing much better Denies recent fever or chills. Denies sinus pressure, nasal congestion , ear pain or sore throat. Denies chest congestion, or cough productive of sputum. Denies chest pain, palpitations, PND, orthopnea or leg swelling. Denies abdominal pain, nausea, vomitting, diarrhea or constipation. Denies change in bowel movements or bloody stool. Denies dysuria , frequency, incontinence or hesitancy. she does have some  joint pain,and  reduced mobility. Denies headaches, vertigo, seizures. Denies depression, anxiety or insomnia.This is controlled on meds Denies  rash, lesions, or itch.       Current Medications (verified): 1)  Advair Diskus 250-50 Mcg/dose Misc (Fluticasone-Salmeterol) .Marland Kitchen.. 1puff and Rinse, Twice Daily 2)  Allergy Vaccine 1-10 Gets At Pcp (W-E) 3)  Epipen 0.3 Mg/0.20ml Devi (Epinephrine) .... For Severe Allergic Reaction 4)  Crestor 20 Mg  Tabs (Rosuvastatin Calcium) .... Take 1 Tablet By Mouth Every Other Night At Bedtime 5)  Metoprolol Succinate 25 Mg  Tb24 (Metoprolol Succinate) .... 1/2 Tab Two Times A Day 6)  Citracal Maximum 315-250 Mg-Unit Tabs (Calcium Citrate-Vitamin D) .... Take 2 Tablets Daily 7)  Nexium 40 Mg Cpdr (Esomeprazole Magnesium) .... One Cap By Mouth Qd 8)  Miralax  Powd  (Polyethylene Glycol 3350) .... Take 1/2 Dose Daily As Needed 9)  Alendronate Sodium 70 Mg Tabs (Alendronate Sodium) .... Take One Tab Once Weekly 10)  Clorazepate Dipotassium 7.5 Mg Tabs (Clorazepate Dipotassium) .... One Half Tab in Am and One Tab Qhs 11)  Align  Caps (Probiotic Product) .... One Tablet By Mouth Daily 12)  Benefiber  Pack (Guar Gum) .... Take 2 Teaspoons By Mouth Once Dailay Dissolved in At Least 8 Ounces Water/juice 13)  Buspar 5 Mg Tabs (Buspirone Hcl) .... Take 1 Tablet By Mouth Two Times A Day 14)  Aspir-Low 81 Mg Tbec (Aspirin) .... Take 1 Tablet By Mouth Once A Day 15)  Meclizine Hcl 12.5 Mg Tabs (Meclizine Hcl) .... Take 1 Tablet By Mouth Three Times A Day As Needed For Dizziness 16)  Proair Hfa 108 (90 Base) Mcg/act Aers (Albuterol Sulfate) .... 2 Puffs Qid As Needed 17)  Crestor 10 Mg Tabs (Rosuvastatin Calcium) .... Take 2 Tabs At Bedtime  Allergies (verified): 1)  ! Benadryl 2)  ! Exelon (Rivastigmine)  Review of Systems      See HPI General:  Complains of fatigue. Eyes:  Complains of vision loss-both eyes; denies discharge, eye pain, and red eye. Endo:  Denies cold intolerance, excessive hunger, excessive thirst, and heat intolerance. Heme:  Denies abnormal bruising and bleeding. Allergy:  Complains of seasonal allergies; on immunotherapy which has it controlled.  Physical Exam  General:  Well-developed,well-nourished,in no acute distress; alert,appropriate and cooperative throughout examination HEENT: No facial asymmetry,  EOMI, no sinus tenderness, TM's Clear, oropharynx  pink and moist.   Chest: Clear to auscultation bilaterally.  CVS: S1, S2, No murmurs, No S3.   Abd: Soft, Nontender.  MS: decreased  ROM spine, hips, shoulders and knees.  Ext: No edema.   CNS: CN 2-12 intact, power tone and sensation normal throughout.   Skin: Intact, no visible lesions or rashes.  Psych: Good eye contact, normal affect.  Memory loss,not anxious or depressed  appearing.    Impression & Recommendations:  Problem # 1:  GERD (ICD-530.81) Assessment Improved  Her updated medication list for this problem includes:    Nexium 40 Mg Cpdr (Esomeprazole magnesium) ..... One cap by mouth qd  Problem # 2:  HYPERLIPIDEMIA (ICD-272.4) Assessment: Unchanged  Her updated medication list for this problem includes:    Crestor 20 Mg Tabs (Rosuvastatin calcium) .Marland Kitchen... Take 1 tablet by mouth every other night at bedtime    Crestor 10 Mg Tabs (Rosuvastatin calcium) .Marland Kitchen... Take 2 tabs at bedtime  Labs Reviewed: SGOT: 30 (11/07/2009)   SGPT: 23 (11/07/2009)   HDL:83 (11/07/2009), 78 (06/15/2009)  LDL:79 (11/07/2009), 82 (06/15/2009)  Chol:172 (11/07/2009), 170 (06/15/2009)  Trig:51 (11/07/2009), 49 (06/15/2009)  Problem # 3:  PRESENILE DEMENTIA, UNCOMPLICATED (ICD-290.10) Assessment: Unchanged  Complete Medication List: 1)  Advair Diskus 250-50 Mcg/dose Misc (Fluticasone-salmeterol) .Marland Kitchen.. 1puff and rinse, twice daily 2)  Allergy Vaccine 1-10 Gets At Pcp (w-e)  3)  Epipen 0.3 Mg/0.18ml Devi (Epinephrine) .... For severe allergic reaction 4)  Crestor 20 Mg Tabs (Rosuvastatin calcium) .... Take 1 tablet by mouth every other night at bedtime 5)  Metoprolol Succinate 25 Mg Tb24 (Metoprolol succinate) .... 1/2 tab two times a day 6)  Citracal Maximum 315-250 Mg-unit Tabs (Calcium citrate-vitamin d) .... Take 2 tablets daily 7)  Nexium 40 Mg Cpdr (Esomeprazole magnesium) .... One cap by mouth qd 8)  Miralax Powd (Polyethylene glycol 3350) .... Take 1/2 dose daily as needed 9)  Alendronate Sodium 70 Mg Tabs (Alendronate sodium) .... Take one tab once weekly 10)  Clorazepate Dipotassium 7.5 Mg Tabs (Clorazepate dipotassium) .... One half tab in am and one tab qhs 11)  Align Caps (Probiotic product) .... One tablet by mouth daily 12)  Benefiber Pack (Guar gum) .... Take 2 teaspoons by mouth once dailay dissolved in at least 8 ounces water/juice 13)  Buspar 5 Mg Tabs  (Buspirone hcl) .... Take 1 tablet by mouth two times a day 14)  Aspir-low 81 Mg Tbec (Aspirin) .... Take 1 tablet by mouth once a day 15)  Meclizine Hcl 12.5 Mg Tabs (Meclizine hcl) .... Take 1 tablet by mouth three times a day as needed for dizziness 16)  Proair Hfa 108 (90 Base) Mcg/act Aers (Albuterol sulfate) .... 2 puffs qid as needed 17)  Crestor 10 Mg Tabs (Rosuvastatin calcium) .... Take 2 tabs at bedtime  Other Orders: Admin of patients own med IM/SQ (16109U)  Patient Instructions: 1)  Please schedule a follow-up appointment in 3 months. 2)  No med changes at this time.     Orders Added: 1)  Est. Patient Level IV [04540] 2)  Admin of patients own med IM/SQ [96372M]   Appended Document: F UP    Clinical Lists Changes  Orders: Added new Service order of Influenza Vaccine MCR [phone]) - Signed Observations: Added new observation of FLU VAX#1VIS: 01/31/10 version given March 14, 2010. (03/14/2010 8:01) Added new  observation of FLU VAXLOT: 1105 5p (03/14/2010 8:01) Added new observation of FLU VAX EXP: 11/2010 (03/14/2010 8:01) Added new observation of FLU VAXBY: Brandi Hudy LPN (16/04/9603 8:01) Added new observation of FLU VAXRTE: IM (03/14/2010 8:01) Added new observation of FLU VAX DSE: 0.5 ml (03/14/2010 8:01) Added new observation of FLU VAXMFR: novartis (03/14/2010 8:01) Added new observation of FLU VAX SITE: left deltoid (03/14/2010 8:01) Added new observation of FLU VAX: Fluvax MCR (03/14/2010 8:01)       Influenza Vaccine    Vaccine Type: Fluvax MCR    Site: left deltoid    Mfr: novartis    Dose: 0.5 ml    Route: IM    Given by: Everitt Amber LPN    Exp. Date: 11/2010    Lot #: [id number]    VIS given: 01/31/10 version given March 14, 2010.

## 2010-08-08 NOTE — Assessment & Plan Note (Signed)
Summary: INJ  Nurse Visit   Vital Signs:  Patient profile:   77 year old female Menstrual status:  hysterectomy Height:      65 inches Weight:      128 pounds BMI:     21.38 BP sitting:   90 / 60  (left arm)  Vitals Entered By: Worthy Keeler LPN (August 11, 2009 3:11 PM) Comments weekly allergy injection .5cc subcutaneously left arm pt. tolerated well Worthy Keeler LPN  August 11, 2009 3:11 PM    Allergies: 1)  ! Benadryl  Orders Added: 1)  Admin of patients own med IM/SQ [96372M]   Orders Added: 1)  Admin of patients own med IM/SQ [facility]

## 2010-08-09 ENCOUNTER — Ambulatory Visit: Payer: Medicare Other

## 2010-08-09 ENCOUNTER — Encounter: Payer: Self-pay | Admitting: Family Medicine

## 2010-08-09 DIAGNOSIS — J309 Allergic rhinitis, unspecified: Secondary | ICD-10-CM

## 2010-08-10 NOTE — Assessment & Plan Note (Signed)
Summary: ALLERGY SHOT  Nurse Visit   Vital Signs:  Patient profile:   77 year old female Menstrual status:  hysterectomy Weight:      129.75 pounds BP sitting:   110 / 70  (left arm)  Vitals Entered By: Adella Hare LPN (June 29, 2010 3:56 PM) Comments patient in for weekly allergy injection .5cc subcutaneously in left arm tolerated well Adella Hare LPN  June 29, 2010 3:56 PM    Allergies: 1)  ! Benadryl 2)  ! Exelon (Rivastigmine)  Orders Added: 1)  Admin of patients own med IM/SQ [96372M]   Orders Added: 1)  Admin of patients own med IM/SQ [[id number]] med administered,no complications

## 2010-08-10 NOTE — Assessment & Plan Note (Signed)
Summary: INJ  Nurse Visit   Vital Signs:  Patient profile:   77 year old female Menstrual status:  hysterectomy Height:      65 inches Weight:      130 pounds BMI:     21.71 BP sitting:   120 / 70  (left arm) Cuff size:   regular  Vitals Entered By: Everitt Amber LPN (August 04, 2010 8:25 AM) Comments PATIENT IN FOR WEEKLY ALLERGY INJECTION    Allergies: 1)  ! Benadryl 2)  ! Exelon (Rivastigmine)  Orders Added: 1)  Admin of patients own med IM/SQ [96372M]   Orders Added: 1)  Admin of patients own med IM/SQ [[id number]]

## 2010-08-10 NOTE — Assessment & Plan Note (Signed)
Summary: INJECTION  Nurse Visit   Vital Signs:  Patient profile:   77 year old female Menstrual status:  hysterectomy Weight:      130 pounds BP sitting:   100 / 70  (left arm)  Vitals Entered By: Adella Hare LPN (June 21, 2010 5:37 PM) Comments patient in for weekly allergy injection .5cc given subcutaneously in left arm  tolerated well Adella Hare LPN  June 21, 2010 5:37 PM    Allergies: 1)  ! Benadryl 2)  ! Exelon (Rivastigmine)  Orders Added: 1)  Admin of patients own med IM/SQ [96372M]   Orders Added: 1)  Admin of patients own med IM/SQ [[id number]] no adverse effects

## 2010-08-10 NOTE — Assessment & Plan Note (Signed)
Summary: inj  Nurse Visit   Vital Signs:  Patient profile:   77 year old female Menstrual status:  hysterectomy Height:      65 inches Weight:      131.50 pounds Resp:     16 per minute BP sitting:   124 / 80 Cuff size:   regular  Vitals Entered By: Everitt Amber LPN (July 26, 2010 1:49 PM) CC: Patient in to get weekly allergy injection    Allergies: 1)  ! Benadryl 2)  ! Exelon (Rivastigmine)  Orders Added: 1)  Admin of patients own med IM/SQ [[id number]]   Orders Added: 1)  Admin of patients own med IM/SQ [[id number]] no adverse reactions noted

## 2010-08-10 NOTE — Assessment & Plan Note (Signed)
Summary: inj  Nurse Visit   Vital Signs:  Patient profile:   77 year old female Menstrual status:  hysterectomy Height:      65 inches Weight:      129.75 pounds BP sitting:   110 / 64  (left arm) Cuff size:   regular  Vitals Entered By: Everitt Amber LPN (July 18, 2010 3:10 PM) CC: Patient in to get weekly allergy injection    Allergies: 1)  ! Benadryl 2)  ! Exelon (Rivastigmine)  Orders Added: 1)  Admin of patients own med IM/SQ [96372M]     Orders Added: 1)  Admin of patients own med IM/SQ [96372M] no adverse reactions

## 2010-08-10 NOTE — Assessment & Plan Note (Signed)
Summary: 6 months/ mbw   Copy to:  n/a Primary Provider/Referring Provider:  [name], MD   CC:  6 month follow up visit-allergies; needs Rx for Advair and Epipen.  History of Present Illness:  Nov 25, 2009- Asthma. allergic rhinitis.................daughter here 77 yo never smoker. Dr Teoh/ ENT had her doing saline nasal rinse which did help for awhile. Continues allergy vaccine at Dr Luther Parody' office 1:10. Nasal congestion lying down. Chest feels fine. Sometimes feels heart beat "heavy" at night. Denies nocturnal choking or smothering. Daughter notes supine sleep and snore.  December 29, 2009- Asthma, allergic rhinitis........................daughter here [place] on 11/26/09 had shown normal oxygenation on room air.  Says she hacks once a day or so,  but ran out of Advair.  Had gone to ER for vertigo and for palpitations- better since stopping Exelon patch. Dr Suszanne Conners treated with saline lavage for sinusitis. She continues to find allergy vaccine useful. She has not had reason to use a rescue inhaler.  July 24, 2010- Asthma, allergic rhinitis Nurse-CC: 6 month follow up visit-allergies; needs Rx for Advair and Epipen She ran out of Advair without consequences. Never needs rescue inhaler. Noting some nasal congestion and postnasal drip. Nose stuffy at night, ok in daytime.  Feels allergy shots are holding well. Needs Epipen.      Asthma History    Asthma Control Assessment:    Age range: 12+ years    Symptoms: 0-2 days/week    Nighttime Awakenings: 0-2/month    Interferes w/ normal activity: no limitations    SABA use (not for EIB): 0-2 days/week    Asthma Control Assessment: Well Controlled   Preventive Screening-Counseling & Management  Alcohol-Tobacco     Smoking Status: never     Tobacco Counseling: not indicated; no tobacco use  Current Medications (verified): 1)  Advair Diskus 250-50 Mcg/dose Misc (Fluticasone-Salmeterol) .Marland Kitchen.. 1puff and Rinse, Twice Daily 2)   Allergy Vaccine 1-10 Gets At Pcp (W-E) 3)  Epipen 0.3 Mg/0.34ml Devi (Epinephrine) .... For Severe Allergic Reaction 4)  Crestor 20 Mg  Tabs (Rosuvastatin Calcium) .... Take 1 Tablet By Mouth Every Other Night At Bedtime 5)  Metoprolol Succinate 25 Mg  Tb24 (Metoprolol Succinate) .... 1/2 Tab Two Times A Day 6)  Citracal Maximum 315-250 Mg-Unit Tabs (Calcium Citrate-Vitamin D) .... Take 2 Tablets Daily 7)  Nexium 40 Mg Cpdr (Esomeprazole Magnesium) .... One Cap By Mouth Qd 8)  Miralax  Powd (Polyethylene Glycol 3350) .... Take 1/2 Dose Daily As Needed 9)  Alendronate Sodium 70 Mg Tabs (Alendronate Sodium) .... Take One Tab Once Weekly 10)  Clorazepate Dipotassium 7.5 Mg Tabs (Clorazepate Dipotassium) .... One Half Tab in Am and One Tab Qhs 11)  Align  Caps (Probiotic Product) .... One Tablet By Mouth Daily 12)  Benefiber  Pack (Guar Gum) .... Take 2 Teaspoons By Mouth Once Dailay Dissolved in At Least 8 Ounces Water/juice 13)  Buspirone Hcl 5 Mg Tabs (Buspirone Hcl) .... Take 1 Tablet By Mouth Two Times A Day 14)  Aspir-Low 81 Mg Tbec (Aspirin) .... Take 1 Tablet By Mouth Once A Day  Allergies (verified): 1)  ! Benadryl 2)  ! Exelon (Rivastigmine)  Past History:  Past Medical History: Last updated: 11/10/2007 IBS ALLERGIES GENERALIZED ANXIETY DISORDER (ICD-300.02) OSTEOARTHRITIS (ICD-715.90) HYPERLIPIDEMIA (ICD-272.4) LUNG NODULE (ICD-518.89) Hx of ANGIOEDEMA (ICD-995.1) ALLERGIC RHINITIS (ICD-477.9) ASTHMA (ICD-493.90) CARPAL TUNNEL SYNDROME (ICD-354.0)  CHRONIC ABDOMINAL PAIN SECONDARY TO IBS, CONSTIPATION PREDOMINANT  Past Surgical History: Last updated: 07/27/2009 HYSTERECTOMY fibroids 1979 COLONOSCOPY  2006 normal - redundant Thyroid biopsy 1970 Bilateral cateract surgery 2008  Family History: Last updated: 10/04/2008 THREE CHILDREN MOTHER DECEASED IN CHILDBIRTH/ HYPERTENSIVE FATHER DECEASED PANCREATIC CANCER THREE LIVING SIBLINGS HEALTHY SEVEN DECEASED / ONE  LEUKEMIA / ONE PANCREATIC CANCER Family History of Colon Cancer:Brother  Social History: Last updated: 11/10/2007 [employer] Retired Never Smoked Alcohol use-no Drug use-no  Risk Factors: Smoking Status: never (07/24/2010)  Review of Systems      See HPI  The patient denies anorexia, fever, weight loss, weight gain, vision loss, decreased hearing, hoarseness, chest pain, syncope, dyspnea on exertion, peripheral edema, prolonged cough, headaches, severe indigestion/heartburn, enlarged lymph nodes, and angioedema.    Vital Signs:  Patient profile:   77 year old female Menstrual status:  hysterectomy Height:      65 inches Weight:      130.25 pounds BMI:     21.75 O2 Sat:      98 % on Room air Pulse rate:   64 / minute BP sitting:   128 / 80  (left arm) Cuff size:   regular  Vitals Entered By: Reynaldo Minium CMA (July 24, 2010 1:56 PM)  O2 Flow:  Room air CC: 6 month follow up visit-allergies; needs Rx for Advair and Epipen   Physical Exam  Additional Exam:  General: A/Ox3; pleasant and cooperative, NAD, trim , alert conversational  SKIN: no rash, lesions NODES: no lymphadenopathy HEENT: [place]/AT, EOM- WNL, Conjuctivae- clear, PERRLA, TM-WNL, Nose- clear, Throat- clear and wnl, Mallampati  II, no erythema or drainage NECK: Supple w/ fair ROM, JVD- none, normal carotid impulses w/o bruits Thyroid-  CHEST: Clear to P&A, decreased but clear HEART: RRR, no m/g/r heard ABDOMEN: flat JJO:ACZY, nl pulses, no edema  NEURO: Grossly intact to observation      Impression & Recommendations:  Problem # 1:  ALLERGIC RHINITIS CAUSE UNSPECIFIED (ICD-477.9)  We discussed use of encasings and dust precautions. Continues Allergy vaccine successfully Epipen is refilled  Orders: Est. Patient Level III (60630)  Problem # 2:  ASTHMA (ICD-493.90) She si very well controled now. We are giving permission for her to try staying off Advair, but we will give refills to hold.   Patient  Instructions: 1)  Please schedule a follow-up appointment in 1 year. 2)  Continue present meds 3)  Scripts printed so you can have the drugstore put them on hold for when you need to get them filled. 4)  You can try for now staying off Advair, to see how you do without it.  Prescriptions: EPIPEN 0.3 MG/0.3ML DEVI (EPINEPHRINE) For severe allergic reaction  #1 x prn   Entered and Authorized by:   Waymon Budge MD   Signed by:   Waymon Budge MD on 07/24/2010   Method used:   Print then Give to Patient   RxID:   [id number] ADVAIR DISKUS 250-50 MCG/DOSE MISC (FLUTICASONE-SALMETEROL) 1puff and rinse, twice daily  #1 x prn   Entered and Authorized by:   Waymon Budge MD   Signed by:   Waymon Budge MD on 07/24/2010   Method used:   Print then Give to Patient   RxID:   [id number]

## 2010-08-10 NOTE — Assessment & Plan Note (Signed)
Summary: inj  Nurse Visit   Vital Signs:  Patient profile:   77 year old female Menstrual status:  hysterectomy Weight:      130.50 pounds BP sitting:   100 / 60  (left arm)  Vitals Entered By: Adella Hare LPN (June 15, 2010 3:08 PM) Comments 0.5cc of patients allergy med administered in right arm subcutaneously  tolerated well Adella Hare LPN  June 15, 2010 3:09 PM    Allergies: 1)  ! Benadryl 2)  ! Exelon (Rivastigmine)  Orders Added: 1)  Admin of patients own med IM/SQ [96372M]   Orders Added: 1)  Admin of patients own med IM/SQ [[id number]] pt received immuinotherapy with no adverse side effects

## 2010-08-10 NOTE — Progress Notes (Signed)
  Phone Note Call from Patient   Caller: Patient Summary of Call: patient states that where she had her allergy injection yesterday, her arm is a little sore and little red area around the injection site. advised patient to keep and eye on it, if the red area grows or feels warm to touch may need to go to urgent care for eval  patient agrees Initial call taken by: Adella Hare LPN,  June 30, 2010 10:25 AM

## 2010-08-15 ENCOUNTER — Ambulatory Visit (INDEPENDENT_AMBULATORY_CARE_PROVIDER_SITE_OTHER): Payer: Medicare Other | Admitting: Family Medicine

## 2010-08-15 ENCOUNTER — Encounter: Payer: Self-pay | Admitting: Family Medicine

## 2010-08-15 DIAGNOSIS — R059 Cough, unspecified: Secondary | ICD-10-CM | POA: Insufficient documentation

## 2010-08-15 DIAGNOSIS — J309 Allergic rhinitis, unspecified: Secondary | ICD-10-CM

## 2010-08-15 DIAGNOSIS — K589 Irritable bowel syndrome without diarrhea: Secondary | ICD-10-CM

## 2010-08-15 DIAGNOSIS — E785 Hyperlipidemia, unspecified: Secondary | ICD-10-CM

## 2010-08-15 DIAGNOSIS — R002 Palpitations: Secondary | ICD-10-CM

## 2010-08-15 DIAGNOSIS — R05 Cough: Secondary | ICD-10-CM | POA: Insufficient documentation

## 2010-08-15 DIAGNOSIS — F411 Generalized anxiety disorder: Secondary | ICD-10-CM

## 2010-08-16 NOTE — Assessment & Plan Note (Signed)
Summary: NURSE VISIT- ALLERGY INJECTION  Nurse Visit   Vital Signs:  Patient profile:   77 year old female Menstrual status:  hysterectomy Weight:      128.25 pounds BP sitting:   116 / 70  (left arm)  Vitals Entered By: Adella Hare LPN (August 09, 2010 3:48 PM) Comments PATIENT IN TODAY FOR WEEKLY ALLERGY INJECTION .5CC Subcutaneously IN LEFT ARM PATIENT TOLERATED WELL Adella Hare LPN  August 09, 2010 3:49 PM    Allergies: 1)  ! Benadryl 2)  ! Exelon (Rivastigmine)  Orders Added: 1)  Admin of patients own med IM/SQ [96372M]   Orders Added: 1)  Admin of patients own med IM/SQ [[id number]] Med administered with no complications

## 2010-08-24 NOTE — Assessment & Plan Note (Signed)
Summary: bloody nose at night/yellow mucus during the day   Vital Signs:  Patient profile:   77 year old female Menstrual status:  hysterectomy Height:      65 inches Weight:      129 pounds BMI:     21.54 O2 Sat:      97 % Pulse rate:   98 / minute Pulse rhythm:   regular Resp:     16 per minute BP sitting:   108 / 70  (left arm) Cuff size:   regular  Vitals Entered By: Everitt Amber LPN (August 15, 2010 10:43 AM) CC: c/o nasal congestion at night depending on which side she lays on, the opposite side stops up. Has a drainage in the morning and then it starts dripping out. Has been going on for about a month. Started to notice some light colored blood so she stopped the aspirin   Primary Care Provider:  [name], MD   CC:  c/o nasal congestion at night depending on which side she lays on and the opposite side stops up. Has a drainage in the morning and then it starts dripping out. Has been going on for about a month. Started to notice some light colored blood so she stopped the aspirin.  History of Present Illness: Nasal congestion with bloody drainage and yellow sputum for 10 days. Has noted excessive allergy symptoms, no fever or chills. states she feels everything allright during the day, she wakes up around 3am with abdonial pain and palpitations, symptoms go away as soon as she passes alot of gas.  She still wonders if some of he symptoms are because of stress from her daughter who lives with her , but who she realizes she cannot do without. She has some omega 3 capsules she recently got free at a health fair, which she states have made her BM's normal, not the balls, wants to know if she should continue same, I reviewed the med and advised hwer to do so, since it was helping her bowel movements in particular. She again has questions about property disposal, and i again direct her to discuss this with her 4 daughters together  Current Medications (verified): 1)  Advair  Diskus 250-50 Mcg/dose Misc (Fluticasone-Salmeterol) .Marland Kitchen.. 1puff and Rinse, Twice Daily 2)  Allergy Vaccine 1-10 Gets At Pcp (W-E) 3)  Epipen 0.3 Mg/0.42ml Devi (Epinephrine) .... For Severe Allergic Reaction 4)  Metoprolol Succinate 25 Mg  Tb24 (Metoprolol Succinate) .... 1/2 Tab Two Times A Day 5)  Nexium 40 Mg Cpdr (Esomeprazole Magnesium) .... One Cap By Mouth Qd 6)  Miralax  Powd (Polyethylene Glycol 3350) .... Take 1/2 Dose Daily As Needed 7)  Alendronate Sodium 70 Mg Tabs (Alendronate Sodium) .... Take One Tab Once Weekly 8)  Clorazepate Dipotassium 7.5 Mg Tabs (Clorazepate Dipotassium) .... One Half Tab in Am and One Tab Qhs 9)  Align  Caps (Probiotic Product) .... One Tablet By Mouth Daily 10)  Benefiber  Pack (Guar Gum) .... Take 2 Teaspoons By Mouth Once Dailay Dissolved in At Least 8 Ounces Water/juice 11)  Buspirone Hcl 5 Mg Tabs (Buspirone Hcl) .... Take 1 Tablet By Mouth Two Times A Day 12)  Colace 100 Mg Caps (Docusate Sodium) .... As Needed 13)  Perfect Ratio Omega .... 2 A Day  Allergies (verified): 1)  ! Benadryl 2)  ! Exelon (Rivastigmine)  Review of Systems      See HPI Eyes:  Complains of vision loss-both eyes. ENT:  Complains  of nasal congestion and postnasal drainage; denies sinus pressure and sore throat. CV:  Complains of palpitations; denies chest pain or discomfort, difficulty breathing while lying down, shortness of breath with exertion, and swelling of feet. Resp:  Complains of cough; denies sputum productive. GI:  Complains of abdominal pain and gas; denies loss of appetite, nausea, and vomiting. GU:  Denies dysuria, incontinence, and urinary frequency. MS:  Complains of joint pain; neck and left shoulder pain esp when she sews. Derm:  Complains of dryness; denies lesion(s) and rash. Neuro:  Complains of memory loss; denies headaches, seizures, and sensation of room spinning. Psych:  Complains of anxiety; denies depression, mental problems, and suicidal  thoughts/plans. Endo:  Denies cold intolerance, excessive hunger, excessive thirst, excessive urination, and heat intolerance. Heme:  Denies abnormal bruising and bleeding. Allergy:  Complains of seasonal allergies; on immunotherapy.  Physical Exam  General:  Well-developed,well-nourished,in no acute distress; alert,appropriate and cooperative throughout examination HEENT: No facial asymmetry,  EOMI, No sinus tenderness, TM's Clear, oropharynx  pink and moist.   Chest: Clear to auscultation bilaterally.  CVS: S1, S2, No murmurs, No S3.   Abd: Soft, Nontender.  EA:[id number]  ROM spine,adequate in  hips, shoulders and knees.  Ext: No edema.   CNS: CN 2-12 intact, power tone and sensation normal throughout.   Skin: Intact, no visible lesions or rashes.  Psych: Good eye contact, normal affect.  Memory loss, not anxious or depressed appearing.    Impression & Recommendations:  Problem # 1:  INSOMNIA (ICD-780.52) Assessment Improved  Discussed sleep hygiene. uses meds less often  Problem # 2:  GENERALIZED ANXIETY DISORDER (ICD-300.02) Assessment: Improved  Her updated medication list for this problem includes:    Clorazepate Dipotassium 7.5 Mg Tabs (Clorazepate dipotassium) ..... One half tab in am and one tab qhs    Buspirone Hcl 5 Mg Tabs (Buspirone hcl) .Marland Kitchen... Take 1 tablet by mouth two times a day  Problem # 3:  PRESENILE DEMENTIA, UNCOMPLICATED (ICD-290.10) Assessment: Unchanged  Problem # 4:  HYPERLIPIDEMIA (ICD-272.4) Assessment: Comment Only  The following medications were removed from the medication list:    Crestor 20 Mg Tabs (Rosuvastatin calcium) .Marland Kitchen... Take 1 tablet by mouth every other night at bedtime Low fat dietdiscussed and encouraged  Orders: Medicare Electronic Prescription [phone]) T-Lipid Profile [phone]) T-Hepatic Function [phone])  Labs Reviewed: SGOT: 26 (06/24/2010)   SGPT: 16 (06/24/2010)   HDL:79 (06/24/2010), 83 (11/07/2009)  LDL:76  (06/24/2010), 79 (41/32/4401)  Chol:163 (06/24/2010), 172 (11/07/2009)  Trig:41 (06/24/2010), 51 (11/07/2009)  Problem # 5:  COUGH (ICD-786.2) Assessment: Comment Only delsyn one teaspoon twice daily for 5 days, samples given  Complete Medication List: 1)  Advair Diskus 250-50 Mcg/dose Misc (Fluticasone-salmeterol) .Marland Kitchen.. 1puff and rinse, twice daily 2)  Allergy Vaccine 1-10 Gets At Pcp (w-e)  3)  Epipen 0.3 Mg/0.4ml Devi (Epinephrine) .... For severe allergic reaction 4)  Metoprolol Succinate 25 Mg Tb24 (Metoprolol succinate) .... 1/2 tab two times a day 5)  Nexium 40 Mg Cpdr (Esomeprazole magnesium) .... One cap by mouth qd 6)  Miralax Powd (Polyethylene glycol 3350) .... Take 1/2 dose daily as needed 7)  Alendronate Sodium 70 Mg Tabs (Alendronate sodium) .... Take one tab once weekly 8)  Clorazepate Dipotassium 7.5 Mg Tabs (Clorazepate dipotassium) .... One half tab in am and one tab qhs 9)  Align Caps (Probiotic product) .... One tablet by mouth daily 10)  Benefiber Pack (Guar gum) .... Take 2 teaspoons by mouth once dailay dissolved in at  least 8 ounces water/juice 11)  Buspirone Hcl 5 Mg Tabs (Buspirone hcl) .... Take 1 tablet by mouth two times a day 12)  Colace 100 Mg Caps (Docusate sodium) .... As needed 13)  Perfect Ratio Omega  .... 2 a day 14)  Calcium 600 600 Mg Tabs (Calcium carbonate) .... Take 1 tablet by mouth two times a day  Patient Instructions: 1)  Please schedule a follow-up appointment in 3.5 2)  [facility] to see if they have any senior sessions free of charge coming up in the Spring that you are interested in, have your daughter help with this. 3)  continue the omega supplement twice daily. 4)  New is calcium 600mg  one twice daily. 5)  .Delstyn one teaspoon twice daily for 5 days for your cough and nasal congestion, samples provided 6)  Hepatic Panel prior to visit, ICD-9: 7)  Lipid Panel prior to visit, ICD-9:  fasting in 3.5 months 8)   pls have a meeting with all of your children with regards to property Prescriptions: METOPROLOL SUCCINATE 25 MG  TB24 (METOPROLOL SUCCINATE) 1/2 tab two times a day  #30 Tablet x 2   Entered by:   Adella Hare LPN   Authorized by:   [name] MD   Signed by:   Adella Hare LPN on 16/04/9603   Method used:   Electronically to        [facility] [phone]* (retail)       [address]       [place], [place]  [address]       Ph: [phone] or [phone]       Fax: [phone]   RxID:   [id number] ADVAIR DISKUS 250-50 MCG/DOSE MISC (FLUTICASONE-SALMETEROL) 1puff and rinse, twice daily  #1 x 2   Entered by:   Adella Hare LPN   Authorized by:   [name] MD   Signed by:   Adella Hare LPN on 74/25/9563   Method used:   Electronically to        [facility] [phone]* (retail)       [address]       [place], [place]  [address]       Ph: [phone] or [phone]       Fax: [phone]   RxID:   [id number] CALCIUM 600 600 MG TABS (CALCIUM CARBONATE) Take 1 tablet by mouth two times a day  #60 x 5   Entered and Authorized by:   [name] MD   Signed by:   [name] MD on 08/15/2010   Method used:   Electronically to        [facility] [phone]* (retail)       [address]       [place], [place]  [address]       Ph: [phone] or [phone]       Fax: [phone]   RxID:   [id number]    Orders Added: 1)  Est. Patient Level IV [27782] 2)  Medicare Electronic Prescription [G8553] 3)  T-Lipid Profile [80061-22930] 4)  T-Hepatic Function [42353-61443]

## 2010-09-01 ENCOUNTER — Ambulatory Visit: Payer: Medicare Other

## 2010-09-01 ENCOUNTER — Encounter: Payer: Self-pay | Admitting: Family Medicine

## 2010-09-05 ENCOUNTER — Encounter: Payer: Self-pay | Admitting: Family Medicine

## 2010-09-05 ENCOUNTER — Telehealth: Payer: Self-pay | Admitting: Family Medicine

## 2010-09-05 NOTE — Assessment & Plan Note (Signed)
Summary: allergy shot  Nurse Visit   Vital Signs:  Patient profile:   77 year old female Menstrual status:  hysterectomy Height:      65 inches Weight:      127.75 pounds BP sitting:   100 / 60  (left arm)  Vitals Entered By: Adella Hare LPN (September 01, 2010 11:30 AM) CC: weekly allergy injection Comments weekly allergy injection administered right arm subcutaneously .5cc tolerated well Adella Hare LPN  September 01, 2010 11:31 AM    Allergies: 1)  ! Benadryl 2)  ! Exelon (Rivastigmine) injection administered with no adverse effects

## 2010-09-06 ENCOUNTER — Ambulatory Visit (INDEPENDENT_AMBULATORY_CARE_PROVIDER_SITE_OTHER): Payer: Medicare Other

## 2010-09-06 ENCOUNTER — Encounter: Payer: Self-pay | Admitting: Family Medicine

## 2010-09-06 DIAGNOSIS — J309 Allergic rhinitis, unspecified: Secondary | ICD-10-CM

## 2010-09-07 ENCOUNTER — Emergency Department (HOSPITAL_COMMUNITY)
Admission: EM | Admit: 2010-09-07 | Discharge: 2010-09-07 | Disposition: A | Discharge status: Home / Self Care | Payer: Medicare Other | Attending: Emergency Medicine | Admitting: Emergency Medicine

## 2010-09-07 DIAGNOSIS — R002 Palpitations: Secondary | ICD-10-CM | POA: Insufficient documentation

## 2010-09-07 DIAGNOSIS — Z79899 Other long term (current) drug therapy: Secondary | ICD-10-CM | POA: Insufficient documentation

## 2010-09-07 DIAGNOSIS — K219 Gastro-esophageal reflux disease without esophagitis: Secondary | ICD-10-CM | POA: Insufficient documentation

## 2010-09-07 DIAGNOSIS — J45909 Unspecified asthma, uncomplicated: Secondary | ICD-10-CM | POA: Insufficient documentation

## 2010-09-07 LAB — DIFFERENTIAL
Basophils Absolute: 0 10*3/uL (ref 0.0–0.1)
Basophils Relative: 0 % (ref 0–1)
Eosinophils Absolute: 0 10*3/uL (ref 0.0–0.7)
Eosinophils Relative: 2 % (ref 0–5)
Lymphs Abs: 1.4 10*3/uL (ref 0.7–4.0)
Neutrophils Relative %: 38 % — ABNORMAL LOW (ref 43–77)

## 2010-09-07 LAB — COMPREHENSIVE METABOLIC PANEL
AST: 27 U/L (ref 0–37)
Albumin: 3.5 g/dL (ref 3.5–5.2)
Alkaline Phosphatase: 25 U/L — ABNORMAL LOW (ref 39–117)
Chloride: 104 mEq/L (ref 96–112)
GFR calc Af Amer: >60 mL/min (ref >60)
Potassium: 3.9 mEq/L (ref 3.5–5.1)
Sodium: 140 mEq/L (ref 135–145)
Total Bilirubin: 0.4 mg/dL (ref 0.3–1.2)
Total Protein: 7.3 g/dL (ref 6.0–8.3)

## 2010-09-07 LAB — CBC
Platelets: 114 10*3/uL — ABNORMAL LOW (ref 150–400)
RBC: 3.31 MIL/uL — ABNORMAL LOW (ref 3.87–5.11)
RDW: 12.8 % (ref 11.5–15.5)
WBC: 2.7 10*3/uL — ABNORMAL LOW (ref 4.0–10.5)

## 2010-09-07 LAB — POCT CARDIAC MARKERS
CKMB, poc: <1 ng/mL — ABNORMAL LOW (ref 1.0–8.0)
Myoglobin, poc: 87.6 ng/mL (ref 12–200)
Myoglobin, poc: 63.2 ng/mL (ref 12–200)
Troponin i, poc: 0.12 ng/mL — ABNORMAL HIGH (ref 0.00–0.09)

## 2010-09-14 ENCOUNTER — Encounter: Payer: Self-pay | Admitting: Family Medicine

## 2010-09-14 ENCOUNTER — Ambulatory Visit (INDEPENDENT_AMBULATORY_CARE_PROVIDER_SITE_OTHER): Payer: Medicare Other

## 2010-09-14 DIAGNOSIS — J309 Allergic rhinitis, unspecified: Secondary | ICD-10-CM

## 2010-09-14 NOTE — Assessment & Plan Note (Signed)
Summary: injection  Nurse Visit   Vital Signs:  Patient profile:   77 year old female Menstrual status:  hysterectomy Height:      65 inches Weight:      127.75 pounds BMI:     21.34  Vitals Entered By: Everitt Amber LPN (September 06, 2010 4:16 PM) Comments Patient in to get weekly allergy injection    Allergies: 1)  ! Benadryl 2)  ! Exelon (Rivastigmine)  Orders Added: 1)  Admin of patients own med IM/SQ [96372M]   Orders Added: 1)  Admin of patients own med IM/SQ [96372M] no adverse reaction with medication administration

## 2010-09-14 NOTE — Progress Notes (Signed)
  Phone Note Other Incoming   Caller: dr Anuar Walgren Summary of Call: pls let pt know I have read her note she left. (letter states private, so it is being scanned into chart) I recommend she she return to her cardiologist with concerns of intermittent chest pain and fluttering for eval;uation.I will enter a referral the other concern ringing in the ear, unfortunately has no cure, Her allergies are being treated with her shots at this time,  for tingling in  her hand sounds like she may have carpal tunnel, I suggest delaying this work up till her next visit. I suggest she stop the alendronate, she has too many concerns about potential adverse effects and has been on it for nearly 5 years I believe, i am removing i from her list, pls notifypharmacy also Initial call taken by: [name] MD,  September 05, 2010 1:35 PM  Follow-up for Phone Call        patient aware Follow-up by: Adella Hare LPN,  September 05, 2010 1:57 PM

## 2010-09-14 NOTE — Letter (Signed)
Summary: Letter  Letter   Imported By: Lind Guest 09/05/2010 13:53:27  _____________________________________________________________________  External Attachment:    Type:   Image     Comment:   External Document

## 2010-09-19 NOTE — Assessment & Plan Note (Signed)
Summary: Injection  Nurse Visit   Vital Signs:  Patient profile:   77 year old female Menstrual status:  hysterectomy Height:      65 inches Weight:      129.75 pounds BP sitting:   100 / 72  (left arm) Cuff size:   regular  Vitals Entered By: Everitt Amber LPN (September 13, 1608 11:45 AM) CC: Patient in to get weekly allergy injection    Allergies: 1)  ! Benadryl 2)  ! Exelon (Rivastigmine)  Orders Added: 1)  Admin of patients own med IM/SQ [96372M]   Orders Added: 1)  Admin of patients own med IM/SQ [[id number]] Immunotherapy adnministered with no adverse reaction

## 2010-09-20 ENCOUNTER — Encounter: Payer: Self-pay | Admitting: Family Medicine

## 2010-09-20 ENCOUNTER — Ambulatory Visit: Payer: Medicare Other

## 2010-09-20 LAB — CONVERTED CEMR LAB
ALT: 24 units/L (ref 0–35)
Albumin: 4.2 g/dL (ref 3.5–5.2)
Alkaline Phosphatase: 26 units/L — ABNORMAL LOW (ref 39–117)
Cholesterol: 226 mg/dL — ABNORMAL HIGH (ref 0–200)
HDL: 88 mg/dL (ref >39)
Indirect Bilirubin: 0.5 mg/dL (ref 0.0–0.9)
LDL Cholesterol: 130 mg/dL — ABNORMAL HIGH (ref 0–99)
Total Protein: 7.4 g/dL (ref 6.0–8.3)
Triglycerides: 42 mg/dL (ref <150)
VLDL: 8 mg/dL (ref 0–40)

## 2010-09-21 ENCOUNTER — Telehealth: Payer: Self-pay | Admitting: Family Medicine

## 2010-09-25 ENCOUNTER — Telehealth: Payer: Self-pay | Admitting: Family Medicine

## 2010-09-25 LAB — CBC
Platelets: 114 10*3/uL — ABNORMAL LOW (ref 150–400)
RDW: 13 % (ref 11.5–15.5)
WBC: 4.3 10*3/uL (ref 4.0–10.5)

## 2010-09-25 LAB — BASIC METABOLIC PANEL
BUN: 15 mg/dL (ref 6–23)
Calcium: 9.1 mg/dL (ref 8.4–10.5)
GFR calc non Af Amer: >60 mL/min (ref >60)
Glucose, Bld: 92 mg/dL (ref 70–99)
Potassium: 3.6 mEq/L (ref 3.5–5.1)
Sodium: 137 mEq/L (ref 135–145)

## 2010-09-25 LAB — DIFFERENTIAL
Basophils Absolute: 0.1 10*3/uL (ref 0.0–0.1)
Lymphocytes Relative: 44 % (ref 12–46)
Lymphs Abs: 1.9 10*3/uL (ref 0.7–4.0)
Neutro Abs: 1.7 10*3/uL (ref 1.7–7.7)

## 2010-09-26 NOTE — Assessment & Plan Note (Signed)
Summary: injection  Nurse Visit   Vital Signs:  Patient profile:   77 year old female Menstrual status:  hysterectomy Weight:      128.25 pounds BP sitting:   100 / 62  (left arm)  Vitals Entered By: Adella Hare LPN (September 20, 2010 3:44 PM) Comments .5cc of allergy med administered subcutaneously  tolerated well Adella Hare LPN  September 20, 2010 3:45 PM    Allergies: 1)  ! Benadryl 2)  ! Exelon (Rivastigmine)  Pt was given her immunotherapy in the office with no adverse effects

## 2010-09-26 NOTE — Progress Notes (Signed)
Summary: daughter from [place]  Phone Note Call from Patient   Summary of Call: Daughter Kathleen Gallegos from [place] called and wants you to call her back at [phone] she said this was very important. Initial call taken by: Lind Guest,  September 21, 2010 9:25 AM    Spoke with pt and the daughter and pt the decision is taken that no conversations about [name] will be had in her absence, she does not trust the daughter who she lives with and is broke fom paying her bills

## 2010-09-28 ENCOUNTER — Ambulatory Visit (INDEPENDENT_AMBULATORY_CARE_PROVIDER_SITE_OTHER): Payer: Medicare Other

## 2010-09-28 VITALS — BP 110/70 | Wt 125.4 lb

## 2010-09-28 DIAGNOSIS — J309 Allergic rhinitis, unspecified: Secondary | ICD-10-CM

## 2010-10-04 ENCOUNTER — Other Ambulatory Visit: Payer: Self-pay | Admitting: Family Medicine

## 2010-10-04 LAB — LIPID PANEL
Cholesterol: 194 mg/dL (ref 0–200)
HDL: 86 mg/dL (ref >39)
Total CHOL/HDL Ratio: 2.3 Ratio
Triglycerides: 48 mg/dL (ref <150)
VLDL: 10 mg/dL (ref 0–40)

## 2010-10-05 ENCOUNTER — Ambulatory Visit (INDEPENDENT_AMBULATORY_CARE_PROVIDER_SITE_OTHER): Payer: Medicare Other | Admitting: Family Medicine

## 2010-10-05 VITALS — BP 110/72 | Wt 126.1 lb

## 2010-10-05 DIAGNOSIS — J309 Allergic rhinitis, unspecified: Secondary | ICD-10-CM

## 2010-10-05 NOTE — Progress Notes (Signed)
Summary: speak with nurse  Phone Note Call from Patient   Summary of Call: pt needs to speak with nurse about doing lab work. [phone] Initial call taken by: Rudene Anda,  September 25, 2010 1:32 PM  Follow-up for Phone Call        patient wanted to know when next lab is due, advised early july Follow-up by: Adella Hare LPN,  September 26, 2010 9:04 AM

## 2010-10-09 ENCOUNTER — Encounter: Payer: Self-pay | Admitting: Family Medicine

## 2010-10-09 NOTE — Progress Notes (Signed)
Allergy injection given in right arm sq with no signs/symptoms of reaction

## 2010-10-09 NOTE — Progress Notes (Signed)
Nurse visit for weekly allergy injection. Given SQ left arm with no signs of reaction

## 2010-10-11 ENCOUNTER — Ambulatory Visit: Payer: Self-pay | Admitting: Family Medicine

## 2010-10-12 ENCOUNTER — Ambulatory Visit (INDEPENDENT_AMBULATORY_CARE_PROVIDER_SITE_OTHER): Payer: Medicare Other | Admitting: Family Medicine

## 2010-10-12 DIAGNOSIS — J309 Allergic rhinitis, unspecified: Secondary | ICD-10-CM

## 2010-10-12 NOTE — Progress Notes (Signed)
Given in right arm SQ with no signs of reaction

## 2010-10-16 ENCOUNTER — Ambulatory Visit: Payer: Medicare Other

## 2010-10-22 ENCOUNTER — Other Ambulatory Visit: Payer: Self-pay | Admitting: Family Medicine

## 2010-10-23 ENCOUNTER — Other Ambulatory Visit: Payer: Self-pay

## 2010-10-23 LAB — BASIC METABOLIC PANEL
BUN: 14 mg/dL (ref 6–23)
CO2: 28 mEq/L (ref 19–32)
Chloride: 103 mEq/L (ref 96–112)
Creatinine, Ser: 0.79 mg/dL (ref 0.4–1.2)
GFR calc Af Amer: >60 mL/min (ref >60)
Glucose, Bld: 95 mg/dL (ref 70–99)

## 2010-10-23 LAB — PROTIME-INR: INR: 1.1 (ref 0.00–1.49)

## 2010-10-23 LAB — DIFFERENTIAL
Basophils Absolute: 0 10*3/uL (ref 0.0–0.1)
Basophils Relative: 1 % (ref 0–1)
Eosinophils Absolute: 0.1 10*3/uL (ref 0.0–0.7)
Monocytes Relative: 13 % — ABNORMAL HIGH (ref 3–12)
Neutrophils Relative %: 42 % — ABNORMAL LOW (ref 43–77)

## 2010-10-23 LAB — CBC
MCHC: 33.4 g/dL (ref 30.0–36.0)
MCV: 100 fL (ref 78.0–100.0)
RBC: 3.38 MIL/uL — ABNORMAL LOW (ref 3.87–5.11)
RDW: 13.2 % (ref 11.5–15.5)

## 2010-10-23 MED ORDER — CLORAZEPATE DIPOTASSIUM 7.5 MG PO TABS: ORAL_TABLET | ORAL | Status: DC

## 2010-10-26 ENCOUNTER — Ambulatory Visit: Payer: Medicare Other | Admitting: Family Medicine

## 2010-10-26 ENCOUNTER — Ambulatory Visit (INDEPENDENT_AMBULATORY_CARE_PROVIDER_SITE_OTHER): Payer: Medicare Other | Admitting: Family Medicine

## 2010-10-26 ENCOUNTER — Encounter: Payer: Self-pay | Admitting: Family Medicine

## 2010-10-26 DIAGNOSIS — F419 Anxiety disorder, unspecified: Secondary | ICD-10-CM

## 2010-10-26 DIAGNOSIS — F411 Generalized anxiety disorder: Secondary | ICD-10-CM

## 2010-10-26 DIAGNOSIS — J309 Allergic rhinitis, unspecified: Secondary | ICD-10-CM

## 2010-10-26 DIAGNOSIS — E785 Hyperlipidemia, unspecified: Secondary | ICD-10-CM

## 2010-10-26 NOTE — Progress Notes (Signed)
Allergy injection given in left arm with no reaction

## 2010-10-26 NOTE — Patient Instructions (Addendum)
F/U in 2 months.  Please continue to go to therapy., this will help you a lot.  No new medications at this time.  A healthy diet is rich in fruit, vegetables and whole grains. Poultry fish, nuts and beans are a healthy choice for protein rather then red meat. A low sodium diet and drinking 64 ounces of water daily is generally recommended. Oils and sweet should be limited. Carbohydrates especially for those who are diabetic or overweight, should be limited to 34-45 gram per meal. It is important to eat on a regular schedule, at least 3 times daily. Snacks should be primarily fruits, vegetables or nuts.  Your recent labwork regarding cholesterol and liver were great

## 2010-10-29 ENCOUNTER — Encounter: Payer: Self-pay | Admitting: Family Medicine

## 2010-10-29 NOTE — Progress Notes (Signed)
  Subjective:    Patient ID: Kathleen Gallegos, female    DOB: 1926/05/09, 77 y.o.   MRN: [id number]  HPI The PT is here for follow up and re-evaluation of chronic medical conditions, medication management and review of recent lab and radiology data.  Preventive health is updated, specifically  Cancer screening,  and Immunization.   Questions or concerns regarding consultations or procedures which the PT has had in the interim are  addressed. She denies any adverse reactions to current medications since the last visit.  There are no new concerns.  She continues to express concern over her bowel movements which have always been "balls" and is concerned about weight loss. Her stress at home she anticipates will lessen as finances improve due to recent events. She is here for her weekly allergy injection also    Review of Systems Denies recent fever or chills. Denies sinus pressure, nasal congestion, ear pain or sore throat. Denies chest congestion, productive cough or wheezing. Denies chest pains, palpitations, paroxysmal nocturnal dyspnea, orthopnea and leg swelling Chronic nocturnal abdominal pain,passes gas and gets relief.denies nausea, vomiting,diarrhea   Denies rectal bleeding or change in bowel movement. Denies dysuria, frequency, hesitancy or incontinence.  Denies headaches, seizure, numbness, or tingling. Denies skin break down or rash.        Objective:   Physical Exam    Patient alert and oriented and in no Cardiopulmonary distress.  HEENT: No facial asymmetry, EOMI, no sinus tenderness, TM's clear, Oropharynx pink and moist.  Neck decreased ROM, no adenopathy.  Chest: Clear to auscultation bilaterally.  CVS: S1, S2 no murmurs, no S3.  ABD: Soft non tender. Bowel sounds normal.  Ext: No edema  MS: Adequate , though reduced , ROM spine, shoulders, hips and knees.  Skin: Intact, no ulcerations or rash noted.  Psych: Good eye contact, normal affect. Memory intact  not anxious or depressed appearing.  CNS: CN 2-12 intact, power, tone and sensation normal throughout.     Assessment & Plan:  1.Perrenial allergies: stable on immunotherapy , injection of pt's med today. 2. Hyperlipidemia: controlled based on recent dat, continue current med 3. Anxiety : improved 4. IBS: unchahanged

## 2010-11-09 ENCOUNTER — Telehealth: Payer: Self-pay | Admitting: Family Medicine

## 2010-11-09 ENCOUNTER — Encounter (HOSPITAL_COMMUNITY): Payer: Medicare Other | Admitting: Psychology

## 2010-11-09 NOTE — Telephone Encounter (Signed)
b

## 2010-11-10 NOTE — Telephone Encounter (Signed)
Spoke with Kathleen Gallegos and she states that the patient said she was fine now but would let us know if it happened again. Just wanted [place] to know

## 2010-11-14 ENCOUNTER — Ambulatory Visit (INDEPENDENT_AMBULATORY_CARE_PROVIDER_SITE_OTHER): Payer: Medicare Other | Admitting: Family Medicine

## 2010-11-14 DIAGNOSIS — J309 Allergic rhinitis, unspecified: Secondary | ICD-10-CM

## 2010-11-14 NOTE — Progress Notes (Signed)
.  4cc of patients allergy med administered in left arm Tolerated well

## 2010-11-16 ENCOUNTER — Telehealth: Payer: Self-pay | Admitting: *Deleted

## 2010-11-16 NOTE — Telephone Encounter (Signed)
pls let daughter know that she will need to physically come top an appt with hermother to have any concerns addressed , she can schedule the "next available"

## 2010-11-16 NOTE — Telephone Encounter (Signed)
Daughter aware.

## 2010-11-20 ENCOUNTER — Telehealth: Payer: Self-pay | Admitting: Family Medicine

## 2010-11-20 NOTE — Telephone Encounter (Signed)
Woke up this morning with her left arm numb all the way to the elbow. Once she got up it went away but also her neck hurts when she turns it to the left. Is waiting on her chiropractor to call her back

## 2010-11-21 NOTE — Consult Note (Signed)
NAME:  Kathleen Gallegos, Kathleen Gallegos            ACCOUNT NO.:  [id number]   MEDICAL RECORD NO.:  [id number]          PATIENT TYPE:  AMB   LOCATION:  DAY                           FACILITY:  [facility]   PHYSICIAN:  R. Roetta Sessions, M.D. DATE OF BIRTH:  1925-07-21   DATE OF CONSULTATION:  01/30/2007  DATE OF DISCHARGE:                                 CONSULTATION   REASON FOR CONSULTATION:  Abdominal pain and weight loss.   HISTORY OF PRESENT ILLNESS:  Ms.  Gallegos is a 77 year old female who  tells me over the last six months she has had intermittent nausea,  heartburn and indigestion.  She also tells me she lost between 3-5  pounds, although, she tells me she has now gained it back.  She tells me  the first of the year she was eating different foods that she did not  normally eat.  She was under a significant amount of psychosocial stress  at home with her daughter and son-in-law having moved in and then later  separated.  She also had a brother to recently pass away.  She was  started on Omeprazole 20 mg b.i.d.  This has seemed to help.  She denies  any abdominal pain at this time.  She has changed her diet and does seem  to feel better.  She has not had any vomiting.  She denies any anorexia.  She denies any diarrhea or constipation.  She is having 1-2 soft  nonbloody bowel movements a day.  Denies any dysphagia or odynophagia.  She does note some gas and bloating.   She had a CT of abdomen and pelvis with contrast on December 27, 2006.  She  had a mildly enlarged retrocrural lymph node of uncertain etiology  2.1x1.2 cm.  Tiny, nonspecific low attenuation foci in the liver,  incomplete distention of the rectum, and otherwise, normal CT.   PAST MEDICAL HISTORY:  1. She had a colonoscopy by Dr.  Jena Gauss on December 21, 2004.  She had a      normal rectal and long tortuous, but otherwise normal appearing      colon.  2. History of chronic constipation, well controlled at this time.  3. History of left neck  and shoulder pain for which she sees a      Land.  4. History of goiter, status post surgery.  5. Hysterectomy.  6. Right hip osteoarthritis.  7. Anxiety.  8. Asthma.  9. Osteopenia.  10.GERD.  11.Hyperlipidemia.   CURRENT MEDICATIONS:  1. Citrucel once daily.  2. Aspirin 81 mg daily.  3. Omeprazole 20 mg b.i.d.  4. Crestor 10 mg q.h.s.  5. Metoprolol 12.5 mg b.i.d.  6. Advair p.r.n.  7. Rhinocort p.r.n.  8. MiraLax 17 g p.r.n.   ALLERGIES:  No known drug allergies.   FAMILY HISTORY:  Positive for her father diagnosed with colon cancer at  age 24.  Brother diagnosed with colon cancer in his 75s.   SOCIAL HISTORY:  Kathleen Gallegos is a widow.  Her daughter resides with her.  She has three healthy, living children.  She is retired.  She has  history of snuff use, but quit 30 years ago.  Denies any alcohol or drug  use.   REVIEW OF SYSTEMS:  See HPI, otherwise negative.   PHYSICAL EXAMINATION:  VITAL SIGNS:  Weight 137 pounds, height 67  inches, temperature 98.2, blood pressure 120/78, pulse 60.  GENERAL:  Ms.  Gallegos is a 77 year old African American female who is  alert and oriented, pleasant and cooperative, in no acute distress.  HEENT:  Sclerae are clear, nonicteric.  Conjunctivae are pink.  Oropharynx pink and moist without any lesions.  NECK:  Supple without any mass or thyromegaly.  CHEST:  Heart regular rate and rhythm with normal S1, S2.  LUNGS:  Clear to auscultation bilaterally.  ABDOMEN:  Positive bowel sounds x4, soft, nontender, nondistended with  no palpable mass or hepatosplenomegaly.  No rebound tenderness or  guarding.  EXTREMITIES:  No clubbing or edema bilaterally.  SKIN:  Warm and dry without any rash or jaundice.   IMPRESSION:  Kathleen Gallegos is a 77 year old female with an approximate 6  month history with refractory GERD symptoms and weight loss.  Her  symptoms are now well controlled on b.i.d. PPI, and she is regaining  weight.  Her recent  colonoscopy was two year ago with a positive family  history of colon cancer and her chronic constipation is well controlled  at this time.  I have offered Ms.  Castell an EGD to further evaluate  her weight loss and refractory GERD symptoms since she is requiring  b.i.d. therapy to rule out peptic ulcer disease.   PLAN:  1. EGD with Dr. Jena Gauss in the near future.  I have discussed the      procedure including risks and benefits which include but are not      limited to bleeding, infection, perforation, drug reaction.  She      agrees with this plan.  Consent will be obtained.  No aspirin three      days prior to the procedure.  2. She is to continue Omeprazole 20 mg b.i.d.  3. GERD precautions.  4. Continue MiraLax 17 g p.r.n. as well as Citrucel.   I would like to thank Dr. Lodema Hong for allowing [place] to participate in the  care of Ms. Brumett.      Lorenza Burton, N.P.      Jonathon Bellows, M.D.  Electronically Signed    KJ/MEDQ  D:  01/31/2007  T:  01/31/2007  Job:  [id number]

## 2010-11-21 NOTE — Op Note (Signed)
NAME:  Kathleen Gallegos, Kathleen Gallegos            ACCOUNT NO.:  [id number]   MEDICAL RECORD NO.:  [id number]          PATIENT TYPE:  AMB   LOCATION:  DAY                           FACILITY:  [facility]   PHYSICIAN:  R. Roetta Sessions, M.D. DATE OF BIRTH:  06-14-1926   DATE OF PROCEDURE:  02/10/2007  DATE OF DISCHARGE:                               OPERATIVE REPORT   PROCEDURE:  Diagnostic EGD.   INDICATIONS FOR PROCEDURE:  Patient is a 77 year old lady with apparent  refractory reflux symptoms in the past couple of months.  She is now on  omeprazole 20 mg orally b.i.d. and states those symptoms have setting  down.  She had an abdominal CT previously on December 27, 2006, which  demonstrated some 2 cm subcarinal lymph nodes without any other obvious  significant abnormality.  She is not having any dysphagia or  odynophagia.  EGD is now being done.  This approach has been discussed  with the patient at length.  Potential risks, benefits, and alternatives  have been reviewed.  Questions answered.  She is agreeable.  Please see  documentation in medical record.   PROCEDURE:  O2 saturation, blood pressure, pulse and respirations were  monitored throughout the entire procedure.   CONSCIOUS SEDATION:  Versed 30 mg IV, Demerol 75 mg IV in divided doses.  Cetacaine spray for topical pharyngeal anesthesia.   INSTRUMENT:  Pentax video chip system.   FINDINGS:  Examination of the tubular esophagus revealed no mucosal  abnormalities.  EG junction easily traversed.   Stomach:  Gastric cavity was empty, insufflated well with air.  Thorough  examination of the gastric mucosa including retroflexed view of the  proximal stomach, esophagogastric junction demonstrated only a small  hiatal hernia.  Pylorus was patent, easily traversed.  Examination of  the bulb and second portion revealed no abnormalities.   THERAPEUTIC/DIAGNOSTIC MANEUVERS PERFORMED:  None.   Patient tolerated procedure well as reactive to  endoscopy.   IMPRESSION:  Normal esophagus, small hiatal hernia, otherwise normal  stomach, first and second diagonal.   RECOMMENDATIONS:  1. Continue omeprazole 20 mg orally b.i.d.  2. I personally reviewed the abdominal CT with Dr. Alver Fisher.  She indeed      does have subcarinal adenopathy, 2+ cm      lymph nodes warrants further investigation.  3. Chest CT with IV contrast to further evaluate lymph nodes at a      minimum.  Plan to see her back in six weeks in the office.  Further      recommendations to follow.      Jonathon Bellows, M.D.  Electronically Signed     RMR/MEDQ  D:  02/10/2007  T:  02/10/2007  Job:  [id number]   cc:   [name], M.D.  Fax: [phone]

## 2010-11-21 NOTE — Assessment & Plan Note (Signed)
[facility]                             PULMONARY OFFICE NOTE   MAKAIA, RAPPA                   MRN:          [id number]  DATE:11/26/2006                            DOB:          09-22-1925    PROBLEM:  1. Asthma.  2. Allergic rhinitis.  3. History of angioedema.   HISTORY:  She continues allergy vaccine at 1/10 with no problems.  She  has an Epi-Pen she has never needed and we reviewed safety issues.  She  has had increased hoarseness this spring and complains of a deep,  nonproductive cough at times.  She has been out of Advair for two or  three months and recognizes heartburn, saying she is intolerant of acid  blockers.  She complains of stress.   MEDICATIONS:  1. Advair 250/50 (out).  2. Allergy vaccine at 1/10.  3. Crestor.  4. Metoprolol 25 mg x1/2.  5. Aspirin 81 mg.  6. Occasional Allegra 60 mg.  7. AcipHex 20 mg.  8. Clorazepate.  9. Epi-Pen is available.   ALLERGIES:  Drug intolerant:  BENADRYL, which she says makes things look  green.   OBJECTIVE:  VITAL SIGNS:  Weight 136 pounds, blood pressure 122/68,  pulse 60, room air saturation 98%.  HEENT:  Throat is somewhat red and she sounds a little hoarse.  LUNGS:  Breath sounds are perhaps a little bit coarse but without real  wheeze or rhonchi.  There is no dullness, no increased work of  breathing, no cough.  HEART:  Heart sounds are regular without murmur heard.   IMPRESSION:  1. Rhinitis.  2. Asthma/bronchitis.  She does have reflux and we discussed this      carefully.   PLAN:  Reflux precautions were reemphasized and she will discuss  management with her primary physician.  We refilled Rhinocort and  Advair.  Discussed nasal saline lavage for p.r.n.  Gave Depo-Medrol 80 mg IM with steroid talk.  Schedule return in six  months, earlier p.r.n.  Continue allergy vaccine at 1/10.     Clinton D. Maple Hudson, MD, Tonny Bollman, FACP  Electronically Signed    CDY/MedQ  DD:  12/01/2006  DT: 12/01/2006  Job #: [id number]   cc:   [name], M.D.

## 2010-11-21 NOTE — Assessment & Plan Note (Signed)
[name]                             PULMONARY OFFICE NOTE   Kathleen Gallegos, Kathleen Gallegos                   MRN:          [id number]  DATE:01/27/2007                            DOB:          1926-01-16    PROBLEM LIST:  1. Asthma.  2. Allergic rhinitis.  3. History of angioedema.   HISTORY:  Allergy vaccine continues at 1:10 with no problems.  Minor  stuffiness in the left nostril today, minimal left frontal headache  occasionally, no cough, not aware of post nasal drainage, less  hoarseness, saline nasal lavage has helped.  There has been nothing  bloody or purulent.   MEDICATIONS:  1. Advair 250/50.  2. Allergy vaccine.  3. Crestor 2 days a week.  4. Rhinocort.  5. Metoprolol 25 mg x1/2 b.i.d.  6. Aspirin 81 mg.  7. Allegra p.r.n. use.  8. Aciphex.   DRUG INTOLERANT:  BENADRYL, she says affected her vision.   OBJECTIVE:  VITAL SIGNS:  Weight 136 pounds, BP 120/76, pulse 61, room  air saturation 99%.  LUNGS:  She sounds a little hoarse to me but it may be an age-related  dysphonia.  THROAT:  Her pharynx looks clear.  There is no stridor.  EARS:  She has some cerumen in the ear canals, not obstructing.  CHEST:  Quiet and clear.  HEART:  Sounds are regular.  I do not find adenopathy.   IMPRESSION:  Allergic rhinitis and asthma adequately controlled  She is  satisfied.   PLAN:  1. Continue vaccine.  2. Continue present treatments.  3. Schedule return in 6 months, earlier p.r.n.     Clinton D. Maple Hudson, MD, Tonny Bollman, FACP  Electronically Signed    CDY/MedQ  DD: 02/01/2007  DT: 02/02/2007  Job #: [id number]   cc:   [name], M.D.  [name], MD

## 2010-11-21 NOTE — Assessment & Plan Note (Signed)
[facility]                             PULMONARY OFFICE NOTE   Kathleen, Gallegos                   MRN:          [id number]  DATE:05/27/2007                            DOB:          12-09-1925    PULMONARY FOLLOW-UP:   PROBLEMS:  1. Asthma.  2. Allergic rhinitis.  3. History of angioedema.   HISTORY:  Says she is doing fine now.  She had called [place] in August  asking to start back on allergy vaccine and has successfully rebuilt,  getting her injections without problems through the office nurse at Dr.  Anthony Sar.  She has had her flu shot.  CT scan of her sinuses in June  was negative.   Her medication list is charted and reviewed, significant today for:  1. Advair 250/50 mcg.  2. Allergy vaccine.  3. Rhinocort.  4. Allegra 60 mg.   She does have an EpiPen.   OBJECTIVE:  Weight 140 pounds, BP 106/64, pulse 61, room air saturation  98%.  Heart sounds are regular without murmur.  Nasal airway is clear and unobstructed.  CHEST:  Clear.  Unlabored breathing.  She looks quite comfortable.   IMPRESSION:  Doing very well.   PLAN:  We refilled her Rhinocort to have available.  Schedule return one  year, earlier p.r.n.     Clinton D. Maple Hudson, MD, Tonny Bollman, FACP  Electronically Signed    CDY/MedQ  DD: 05/27/2007  DT: 05/28/2007  Job #: [id number]   cc:   [name], M.D.

## 2010-11-21 NOTE — Assessment & Plan Note (Signed)
NAME:  Kathleen Gallegos, Kathleen Gallegos             CHART#:  [id number]   DATE:  03/25/2007                       DOB:  11-24-1925   CHIEF COMPLAINT:  Followup for procedure.   SUBJECTIVE:  The patient is 77 year old [place] American female who  presents for followup of recent EGD.  She has a history of refractory  GERD symptoms, and 3 to 5 pound weight loss.  On 02/10/2007 EGD revealed  a small hiatal hernia but otherwise normal.  She has been on omeprazole  20 mg b.i.d. with complete resolution of her reflux symptoms.  At times  she does miss her second dose and seems to do okay.  She has modified  her diet, and is consuming less greasy foods which seems to help.  She  is now on MiraLax which seems to help her constipation; she takes it  once a day as needed.  She denies any blood in the stool.  Since we saw  her she went to a chiropractor who helped with a lot of her neck  tension.  Overall she states she is feeling well.   There is quite a bit of confusion concerning her family history.  She  told Lorenza Burton, Nurse Practitioner, recently that she had a father  and brother who both had colon cancer.  Prior note from Dr. Jena Gauss said  that her father had pancreatic cancer, and her brother had Doreatha Martin  disease.  She tells me also she had a brother with leukemia.   CURRENT MEDICATIONS:  See updated list.   ALLERGIES:  NO KNOWN DRUG ALLERGIES.   PHYSICAL EXAMINATION:  VITAL SIGNS:  Weight 138.  Height five feet,  seven inches.  Temperature 98, blood pressure 110/78, pulse 68.  GENERAL:  Pleasant, well-nourished, well-developed elderly female in no  acute distress.  SKIN:  Warm and dry.  No jaundice.  ABDOMEN:  Positive bowel sounds.  Soft.  Nontender.  Nondistended.  No  organomegaly or masses.  No rebound tenderness.  No guarding.  EXTREMITIES:  Lower extremities no edema.   IMPRESSION:  The patient is a 77 year old lady with recent refractory  GERD symptoms now controlled on omeprazole  20 mg daily to b.i.d.  Recent  EGD findings reassuring.  Constipation has resolved with MiraLax.   PLAN:  1. Continue omeprazole 20 mg daily to b.i.d.  2. Given the discrepancies in her family history I would recommend      colonoscopy in 2011 (5      years from previous colonoscopy).  3. Office visit on a p.r.n. basis.       Tana Coast, P.A.  Electronically Signed     R. Roetta Sessions, M.D.  Electronically Signed    LL/MEDQ  D:  03/25/2007  T:  03/25/2007  Job:  [id number]   cc:   [name], M.D.

## 2010-11-21 NOTE — Assessment & Plan Note (Signed)
NAME:  Kathleen Gallegos, Kathleen Gallegos             CHART#:  [id number]   DATE:  07/21/2007                       DOB:  [birth year]   CHIEF COMPLAINT:  Follow up weight loss/refractory gastroesophageal  reflux disease/abnormal CT.   SUBJECTIVE:  Kathleen Gallegos is a 77 year old female.  She has been  evaluated for refractory GERD symptoms and weight loss.  She is now well  controlled on two times a day omeprazole.  Her weight loss has  stabilized.  She does have constipation.  She has been using fiber and  eating raw fruit.  This does seem to help.  She does note some  bubbling sensation in her upper abdomen, mostly left upper quadrant.  It usually awakens her at 4 a.m.  She is having symptoms of this every  couple of days.  She has been using MiraLax every other day for  constipation.  She denies any rectal bleeding or melena.  She does have  a longstanding history of IBS.  She is taking clorazepate two times a  day.  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  She has had an abnormal CT back on  December 27, 2006.  She had mildly enlarged retrocrural lymph node of  uncertain etiology.  It was recommended she have a 4 month followup CT,  which was done 06/30/2007.  She was noted to have mildly enlarged  retrocrural lymph node, minimally larger and short-axis on current exam  than on previous study, significance uncertain.  There is a low  attenuation foci in the dome of the liver, 7 x 6 mm larger than 5 mm  previously and increased stool in the colon.   MEDICATIONS:  See list from 07/21/2007.   ALLERGIES:  No known drug allergies.   OBJECTIVE:  VITAL SIGNS:  Weight 139 pounds, height 67 inches,  temperature 97.9, blood pressure 112/74, pulse 68.  GENERAL:  She is a well-developed, well-nourished African American  female in no acute distress.  HEENT:  Sclere clear.  Conjunctiva pink.  Oropharynx pink and moist  without lesions.  CHEST:  Heart regular  rate and rhythm.  Normal S1, S2.  ABDOMEN:  Positive bowel sounds times 4.  No bruits auscultated.  Soft,  nontender, nondistended without palpable mass or hepatosplenomegaly.  No  tenderness or guarding.  EXTREMITIES:  Without clubbing or edema bilaterally.  SKIN:  Pink, warm, and dry without any rashes or jaundice.   ASSESSMENT:  Kathleen Gallegos is a 77 year old female with refractory  gastroesophageal reflux disease symptoms, which have resolved on BID  PPI.  She did have a history of weight loss, although her weight has  stabilized recently.  This is reassuring.  She does have possible  interval enlargement of retrocrural lymph node, which I have discussed  with Dr. Jena Gauss, given the CT findings.  He has suggested we proceed with  EUS for further evaluation of this retrocrural lymph node of uncertain  significance.   PLAN:  1. We will request recent laboratory studies from Dr. Anthony Sar      office.  2. She is to continue omeprazole 20 mg two times a day.  3. EUS with Dr. Christella Hartigan in [place] regarding enlarging retrocrural      lymph node, left upper quadrant pain.       Lorenza Burton,  N.P.  Electronically Signed     R. Roetta Sessions, M.D.  Electronically Signed    KJ/MEDQ  D:  07/21/2007  T:  07/22/2007  Job:  [id number]   cc:   [name], M.D.

## 2010-11-24 NOTE — Assessment & Plan Note (Signed)
[facility]                             PULMONARY OFFICE NOTE   Kathleen Gallegos, Kathleen Gallegos                   MRN:          [id number]  DATE:07/22/2006                            DOB:          1926/03/02    PROBLEMS:  1. Asthma.  2. Allergic rhinitis.  3. History of angioedema.   HISTORY:  She had had a viral infection with bronchitis and head  congestion, because of which she stopped her allergy shots in mid  December and she is ready to restart.  We discussed this.  She found  that taking a combination of Benadryl and Aquatab made the room look  green.  There has been no purulent or bloody discharge now and she feels  stable.   MEDICATION:  1. Advair 250/50.  2. Allergy vaccine, on hold.  3. Crestor.  4. Rhinocort.  5. Metoprolol 25 mg one-half.  6. Aspirin 81 mg.  7. Allegra 60 mg p.r.n.  8. AcipHex 20 mg p.r.n.  9. Clorazepate.   Drug intolerant of BENADRYL.   OBJECTIVE:  Weight 139 pounds, BP 110/62, pulse 60, room air saturation  99%.  Clear nose and chest.  She is alert.  Regular pulse, no murmur.   IMPRESSION:  1. Asthma.  2. Allergic rhinitis.  3. Vision change associated with combination of Benadryl and Aquatab.   PLAN:  We discussed allergy vaccine management.  She is going to restart  at 0.3 mL per vial per week and rebuild back per protocol to 0.5 mL per  vial per week.  Schedule return 6 months, earlier p.r.n.     Clinton D. Maple Hudson, MD, Tonny Bollman, FACP  Electronically Signed    CDY/MedQ  DD: 07/22/2006  DT: 07/23/2006  Job #: [id number]   cc:   [name], M.D.  [name], MD

## 2010-11-24 NOTE — Op Note (Signed)
NAME:  Kathleen Gallegos, Kathleen Gallegos            ACCOUNT NO.:  [id number]   MEDICAL RECORD NO.:  [id number]          PATIENT TYPE:  AMB   LOCATION:  DAY                           FACILITY:  [facility]   PHYSICIAN:  R. Roetta Sessions, M.D. DATE OF BIRTH:  [birth year]   DATE OF PROCEDURE:  12/21/2004  DATE OF DISCHARGE:                                 OPERATIVE REPORT   PROCEDURE:  Colonoscopy.   INDICATIONS:  The patient is a 77 year old lady with longstanding  constipation.  She had a colonoscopy previously with Dr. Katrinka Blazing.  Apparently  it was not a complete exam and was complemented by air contrast barium  enema.  There is no history of polyps in the family or colorectal neoplasia  and chronic constipation.  She is devoid of any large __________.  She  desires colonoscopy.  Colonoscopy is now being done.  This approach has been  discussed with the patient at length.  The potential risks, benefits and  alternatives have been reviewed, questions answered and she is agreeable.  Please see documentation in the medical record.   DESCRIPTION OF PROCEDURE:  Oxygen saturation, blood pressure, pulse and  respiration were monitored throughout the entire procedure.  Conscious  sedation with Versed 4 mg IV and Demerol 100 mg IV in divided doses.  The  instrument was the Olympus video chip system.   FINDINGS:  Digital rectal exam revealed no abnormalities.   ENDOSCOPIC FINDINGS:  Prep was good.  Rectum:  Examination of the rectal mucosa including retroflexion in the anal  verge revealed no abnormalities.  Colon:  Colonic mucosa was surveyed from the rectosigmoid junction through  the left, transverse, right colon to the area of the appendiceal orifice,  ileocecal valve and cecum.  These structures were well seen and photographed  for the record.  From this level, the scope was slowly withdrawn.  All  previously mentioned mucosal surfaces were again seen.  The patient's colon  was elongated and tortuous requiring a  combination of maneuvers including  the external abdominal pressure and changing of the patient's position to  reach the cecum.  However, the colonic mucosa appeared normal.  The patient  tolerated the procedure well and was reactive to endoscopy.   IMPRESSION:  1.  Normal rectum.  2.  Long tortuous, but otherwise normal-appearing colon.   RECOMMENDATIONS:  1.  Add MiraLax 17 g orally at bedtime on p.r.n. basis, two daily.  2.  Citrucel.  3.  If she remains in good health, would recommend another screening      colonoscopy in 10 years.       RMR/MEDQ  D:  12/21/2004  T:  12/21/2004  Job:  [id number]   cc:   [name], M.D.  [address]  [place], [place] [address]  Fax: [phone]

## 2010-11-24 NOTE — Consult Note (Signed)
NAME:  Kathleen Gallegos, Kathleen Gallegos            ACCOUNT NO.:  [id number]   MEDICAL RECORD NO.:  [id number]          PATIENT TYPE:  AMB   LOCATION:                                FACILITY:  [facility]   PHYSICIAN:  R. Roetta Sessions, M.D. DATE OF BIRTH:  1926/02/11   DATE OF CONSULTATION:  DATE OF DISCHARGE:                                   CONSULTATION   REASON FOR CONSULTATION:  Constipation, need for a colonoscopy.   HISTORY OF PRESENT ILLNESS:  Kathleen Gallegos is a pleasant 77 year old  African-American female kindly referred out of the courtesy of Dr. [name] to further evaluate intermittent recurrent constipation.  Ms.  Kathleen Gallegos tells me that her bowel function was more or less normal until a  couple of years ago when she was started on Os-Cal, then she became  constipated.  This has been an intermittent problem for her ever since.  She  tells me that Dr. Katrinka Blazing performed a colonoscopy on her a number of years ago  without any significant findings.  She is said to be due for a repeat  colonoscopy in 2007.  Apparently she had no polyps.  There is no family  history of colorectal neoplasia.  She has passed some small flecks of blood  from time to time per her report.  When she eats more healthfully and  consumes more fruits and vegetables, she tends to do better.  She is not  currently taking Os-Cal but it has been recommended to her that she get back  on a calcium supplement.  She does have occasional right lower quadrant pain  temporally related to constipation from time to time.   She does have occasional reflux symptoms, for which she takes Aciphex on a  p.r.n. basis.  She also takes Citrucel regularly.   PAST MEDICAL HISTORY:  1.  Right hip osteoarthritis.  2.  Anxiety neurosis.  3.  Bronchial asthma.  4.  Osteopenia.  5.  GERD.  6.  History of mild thrombocytopenia.  7.  Hyperlipidemia.   CURRENT MEDICATIONS:  1.  Citrucel daily.  2.  ASA 81 mg daily.  3.  Aciphex 20 mg  p.r.n.   ALLERGIES:  No known drug allergies.   FAMILY HISTORY:  One sister recently died with advanced Alzheimer's disease.  Her father died of pancreatic cancer at age 60.  She has a brother that had  [employer] disease, otherwise no history of chronic GI or liver illness.   SOCIAL HISTORY:  The patient is widowed.  She has three children.  She is  retired from [employer] and the [employer] in [place].  She consumed  alcohol socially many years ago but none now and no tobacco.   REVIEW OF SYSTEMS:  No chest pain, no dyspnea on exertion, no odynophagia,  dysphagia or early satiety, nausea or vomiting.  She denies any recent  change in weight.   PHYSICAL EXAMINATION:  GENERAL:  A very pleasant 77 year old lady, well-  groomed, well-oriented, in no acute distress.  VITAL SIGNS:  Weight 136.5, height 5 feet 7 inches, temperature 97.8, BP  122/78, pulse of 64.  SKIN:  Warm and dry.  HEENT:  No scleral icterus.  Oral cavity:  No lesions.  She has an upper  plate.  NECK:  JVD is not prominent.  CHEST:  Lungs are clear to auscultation.  CARDIAC:  Regular rate and rhythm without murmur, gallop, or rub.  BREASTS:  Exam is deferred.  ABDOMEN:  Full, positive bowel sounds, soft, nontender, without appreciable  mass or organomegaly.  EXTREMITIES:  No edema.  RECTAL:  Good sphincter tone, no mass in the rectal vault.  Stool is  Hemoccult-negative.   IMPRESSION:  Kathleen Gallegos is a pleasant 77 year old lady with  intermittent constipation and intermittent right lower quadrant abdominal  pain.  The latter does not seem to be a major problem for her at this time.   She has passed some flecks of blood in her stool previously.  It is good to  see that she is Hemoccult-negative today, and she apparently is due for a  screening colonoscopy next year.   It sounds like with eating more healthfully, she does much better with her  bowel function.  She was committed on a high  fiber-intake diet, and this was  reinforced.   Kathleen Gallegos is interested in having her colon checked again, although it may  be a little bit early.  I do not think this is unreasonable.  Will go ahead  and offer her a screening/diagnostic colonoscopy at this time.  The  potential risks, benefits, and alternatives have been reviewed, questions  answered.  Will make further recommendations as far as management of her  constipation is concerned after colonoscopy has been performed.   I would like to thank Dr. [name] for allowing me to see this nice  lady today.       RMR/MEDQ  D:  11/23/2004  T:  11/23/2004  Job:  [id number]   cc:   [name], M.D.  [address]  [place], [place] [address]  Fax: [phone]

## 2010-11-24 NOTE — Assessment & Plan Note (Signed)
[facility]                               PULMONARY OFFICE NOTE   Kathleen Gallegos, Kathleen Gallegos                   MRN:          [id number]  DATE:01/21/2006                            DOB:          Oct 26, 1925    DIAGNOSES:  1.  Asthma.  2.  Allergic rhinitis.  3.  History of angioedema.   HISTORY:  She comes for his scheduled six-month followup, continuing allergy  vaccine at 1 to 10.  Injections are now being given by a registered nurse  who is her neighbor with no problems.  She is on a beta-blocker.  I  discussed issues of allergy vaccines, safety and appropriateness, policy on  administration outside of a medical office, beta-blocker interaction with  potential use of EpiPen, how to use EpiPen, anaphylaxis, and alternative  therapies.  She wishes to continue allergy vaccine and to get it by  injection from her neighbor.  She reviewed our waver form, and after  appropriate educational discussion, I refilled EpiPen prescription and she  has signed it for chart.  Overall she feels quite stable from a respiratory  standpoint.   MEDICATIONS:  1.  Advair 250/50 b.i.d. for intervals when needed.  2.  She has not needed her albuterol inhaler.  3.  Allergy vaccine.  4.  Crestor.  5.  Rhinocort.  6.  Metoprolol.  7.  Allegra 60 mg.  8.  Aciphex 20 mg  9.  Clorazepate.   ALLERGIES:  NO MEDICATION ALLERGY.   OBJECTIVE:  VITAL SIGNS:  Weight 141 pounds.  Blood pressure 112/70, pulse  regular at 51, room air saturation 99%.  GENERAL:  She looks relaxed and comfortable.  HEENT:  Nasal airway is quite normal.  There are no conjunctival injections.  No postnasal drip.  NECK:  No cervical adenopathy.  LUNGS:  Clear to P&A.  CARDIOVASCULAR:  Heart sounds are regular without murmur or gallop.  EXTREMITIES:  There is no edema.   IMPRESSION:  Stable control of allergic rhinitis and mild __________ asthma.   PLAN:  1.  Allergy vaccine management issues  were discussed, as above, with      significant positive skin test reactions previously to grass, weed and      tree pollens, dust and dust mite primarily.  Risks and safety      considerations, environmental precautions, and conservative      management were reviewed.  2.  Scheduled return in six months, earlier p.r.n.                                   Clinton D. Maple Hudson, MD, FCCP, FACP   CDY/MedQ  DD:  01/24/2006  DT:  01/25/2006  Job #:  [id number]   cc:   [name], MD  [name], MD

## 2010-11-29 ENCOUNTER — Ambulatory Visit (INDEPENDENT_AMBULATORY_CARE_PROVIDER_SITE_OTHER): Payer: Medicare Other

## 2010-11-29 DIAGNOSIS — J309 Allergic rhinitis, unspecified: Secondary | ICD-10-CM

## 2010-11-30 ENCOUNTER — Ambulatory Visit: Payer: Medicare Other

## 2010-12-06 ENCOUNTER — Ambulatory Visit: Payer: Medicare Other | Admitting: Family Medicine

## 2010-12-07 ENCOUNTER — Ambulatory Visit (INDEPENDENT_AMBULATORY_CARE_PROVIDER_SITE_OTHER): Payer: Medicare Other | Admitting: Family Medicine

## 2010-12-07 VITALS — BP 120/70 | Wt 128.1 lb

## 2010-12-07 DIAGNOSIS — J309 Allergic rhinitis, unspecified: Secondary | ICD-10-CM

## 2010-12-08 NOTE — Progress Notes (Signed)
Patient in for weekly allergy injection .5cc in right arm Tolerated well

## 2010-12-11 ENCOUNTER — Ambulatory Visit: Payer: Medicare Other

## 2010-12-12 ENCOUNTER — Encounter (HOSPITAL_COMMUNITY): Payer: Medicare Other | Admitting: Psychology

## 2010-12-14 ENCOUNTER — Ambulatory Visit (INDEPENDENT_AMBULATORY_CARE_PROVIDER_SITE_OTHER): Payer: Medicare Other | Admitting: Family Medicine

## 2010-12-14 DIAGNOSIS — J309 Allergic rhinitis, unspecified: Secondary | ICD-10-CM

## 2010-12-14 NOTE — Progress Notes (Signed)
Patient in for weekly allergy injection, received .5cc in left arm subcutaneously Tolerated well

## 2010-12-18 ENCOUNTER — Other Ambulatory Visit: Payer: Self-pay | Admitting: Family Medicine

## 2010-12-20 ENCOUNTER — Ambulatory Visit (INDEPENDENT_AMBULATORY_CARE_PROVIDER_SITE_OTHER): Payer: Medicare Other | Admitting: Family Medicine

## 2010-12-20 ENCOUNTER — Encounter (INDEPENDENT_AMBULATORY_CARE_PROVIDER_SITE_OTHER): Payer: Medicare Other | Admitting: Psychology

## 2010-12-20 VITALS — BP 112/70 | Wt 127.0 lb

## 2010-12-20 DIAGNOSIS — Z5112 Encounter for antineoplastic immunotherapy: Secondary | ICD-10-CM

## 2010-12-20 DIAGNOSIS — Z298 Encounter for other specified prophylactic measures: Secondary | ICD-10-CM

## 2010-12-20 DIAGNOSIS — F4322 Adjustment disorder with anxiety: Secondary | ICD-10-CM

## 2010-12-20 DIAGNOSIS — F450 Somatization disorder: Secondary | ICD-10-CM

## 2010-12-20 NOTE — Progress Notes (Signed)
Allergy injection SQ given in right arm. No signs of reaction

## 2010-12-26 ENCOUNTER — Encounter: Payer: Self-pay | Admitting: Family Medicine

## 2010-12-27 ENCOUNTER — Encounter: Payer: Self-pay | Admitting: Family Medicine

## 2010-12-27 ENCOUNTER — Ambulatory Visit (INDEPENDENT_AMBULATORY_CARE_PROVIDER_SITE_OTHER): Payer: Medicare Other | Admitting: Family Medicine

## 2010-12-27 VITALS — BP 120/74 | HR 77 | Resp 16 | Ht 64.5 in | Wt 129.4 lb

## 2010-12-27 DIAGNOSIS — K219 Gastro-esophageal reflux disease without esophagitis: Secondary | ICD-10-CM

## 2010-12-27 DIAGNOSIS — R5383 Other fatigue: Secondary | ICD-10-CM

## 2010-12-27 DIAGNOSIS — F411 Generalized anxiety disorder: Secondary | ICD-10-CM

## 2010-12-27 DIAGNOSIS — R5381 Other malaise: Secondary | ICD-10-CM

## 2010-12-27 DIAGNOSIS — J309 Allergic rhinitis, unspecified: Secondary | ICD-10-CM

## 2010-12-27 DIAGNOSIS — E785 Hyperlipidemia, unspecified: Secondary | ICD-10-CM

## 2010-12-27 DIAGNOSIS — J45909 Unspecified asthma, uncomplicated: Secondary | ICD-10-CM

## 2010-12-27 MED ORDER — BUSPIRONE HCL 5 MG PO TABS: 5.0000 mg | ORAL_TABLET | Freq: Two times a day (BID) | ORAL | Status: DC

## 2010-12-27 MED ORDER — FLUTICASONE-SALMETEROL 250-50 MCG/DOSE IN AEPB: 1.0000 | INHALATION_SPRAY | Freq: Two times a day (BID) | RESPIRATORY_TRACT | Status: DC

## 2010-12-27 MED ORDER — PRAVASTATIN SODIUM 40 MG PO TABS: 40.0000 mg | ORAL_TABLET | Freq: Every day | ORAL | Status: DC

## 2010-12-27 NOTE — Assessment & Plan Note (Signed)
Controlled, no change in medication  

## 2010-12-27 NOTE — Assessment & Plan Note (Signed)
Controlled on regime which includes immunotherapy weekly

## 2010-12-27 NOTE — Patient Instructions (Signed)
F/u in 3.5 months.  CBC, fasting chem 7 , lipid, hepatic, and TSH in 3.5 months.  Happy birthday, belated !!!  No medication changes

## 2010-12-27 NOTE — Assessment & Plan Note (Signed)
Controlled, will need rept labs after next visit

## 2010-12-27 NOTE — Progress Notes (Signed)
Patient had weekly allergy injection today 0.5cc subq in left arm Tolerated well

## 2010-12-27 NOTE — Assessment & Plan Note (Signed)
Controlled, no change in medication No episodes of wheezing

## 2010-12-27 NOTE — Progress Notes (Signed)
  Subjective:    Patient ID: Kathleen Gallegos, female    DOB: [birth year], 77 y.o.   MRN: [id number]  HPI .The PT is here for follow up and re-evaluation of chronic medical conditions, medication management and review of recent lab and radiology data.  Preventive health is updated, specifically  Cancer screening, Osteoporosis screening and Immunization.   Questions or concerns regarding consultations or procedures which the PT has had in the interim are  addressed. The PT denies any adverse reactions to current medications since the last visit.  C/o thinning of her hair and dry flaky skin which has responded to  Topical prep in the past. Currently looking for independent living facility for senior in [place], and her children agree and are involved. Reports normal bowel movements, continues to see therapist and finds this very beneficial   Review of Systems Denies recent fever or chills. Denies sinus pressure, nasal congestion, ear pain or sore throat. Denies chest congestion, productive cough or wheezing. Denies chest pains, palpitations, paroxysmal nocturnal dyspnea, orthopnea and leg swelling Denies abdominal pain, nausea, vomiting,diarrhea or constipation.  Denies rectal bleeding , states gas and stool pattern have improved Denies dysuria, frequency, hesitancy or incontinence. Denies joint pain, swelling and limitation in mobility. Denies headaches, seizure, numbness, or tingling. Denies depression,or uncontrolled  anxiety or insomnia. Denies skin break down or rash.        Objective:   Physical Exam Patient alert and oriented and in no Cardiopulmonary distress.  HEENT: No facial asymmetry, EOMI, no sinus tenderness, TM's clear, Oropharynx pink and moist.  Neck supple no adenopathy.  Chest: Clear to auscultation bilaterally.  CVS: S1, S2 no murmurs, no S3.  ABD: Soft non tender. Bowel sounds normal.  Ext: No edema  MS: Adequate ROM spine, shoulders, hips and  knees.  Skin: Intact, no ulcerations or rash noted.Dry   Psych: Good eye contact, normal affect. Memory intact not anxious or depressed appearing.  CNS: CN 2-12 intact, power, tone and sensation normal throughout.        Assessment & Plan:

## 2010-12-27 NOTE — Assessment & Plan Note (Signed)
Improved, continue  meds 

## 2011-01-04 ENCOUNTER — Ambulatory Visit (INDEPENDENT_AMBULATORY_CARE_PROVIDER_SITE_OTHER): Payer: Medicare Other | Admitting: Family Medicine

## 2011-01-04 DIAGNOSIS — Z298 Encounter for other specified prophylactic measures: Secondary | ICD-10-CM

## 2011-01-04 DIAGNOSIS — Z5112 Encounter for antineoplastic immunotherapy: Secondary | ICD-10-CM

## 2011-01-04 NOTE — Progress Notes (Signed)
Given in right arm SQ with no complications

## 2011-01-05 ENCOUNTER — Telehealth: Payer: Self-pay | Admitting: Family Medicine

## 2011-01-05 NOTE — Telephone Encounter (Signed)
Advised to gargle warm salt water, may also try OTC Cepacol

## 2011-01-09 LAB — CBC WITH DIFFERENTIAL/PLATELET
Basophils Absolute: 0 10*3/uL (ref 0.0–0.1)
Basophils Relative: 0 % (ref 0–1)
Eosinophils Absolute: 0.1 10*3/uL (ref 0.0–0.7)
HCT: 36.9 % (ref 36.0–46.0)
Hemoglobin: 12.3 g/dL (ref 12.0–15.0)
MCH: 33.7 pg (ref 26.0–34.0)
MCHC: 33.3 g/dL (ref 30.0–36.0)
Monocytes Absolute: 0.3 10*3/uL (ref 0.1–1.0)
Monocytes Relative: 8 % (ref 3–12)
Neutrophils Relative %: 32 % — ABNORMAL LOW (ref 43–77)

## 2011-01-09 LAB — BASIC METABOLIC PANEL
CO2: 28 mEq/L (ref 19–32)
Chloride: 104 mEq/L (ref 96–112)
Creat: 0.94 mg/dL (ref 0.50–1.10)
Potassium: 4 mEq/L (ref 3.5–5.3)
Sodium: 141 mEq/L (ref 135–145)

## 2011-01-09 LAB — HEPATIC FUNCTION PANEL
Albumin: 4.1 g/dL (ref 3.5–5.2)
Total Bilirubin: 0.5 mg/dL (ref 0.3–1.2)
Total Protein: 7.4 g/dL (ref 6.0–8.3)

## 2011-01-09 LAB — LIPID PANEL
HDL: 83 mg/dL (ref >39)
LDL Cholesterol: 73 mg/dL (ref 0–99)
Total CHOL/HDL Ratio: 2 Ratio
Triglycerides: 40 mg/dL (ref <150)
VLDL: 8 mg/dL (ref 0–40)

## 2011-01-10 LAB — TSH: TSH: 3.749 u[IU]/mL (ref 0.350–4.500)

## 2011-01-24 ENCOUNTER — Ambulatory Visit (INDEPENDENT_AMBULATORY_CARE_PROVIDER_SITE_OTHER): Payer: Medicare Other | Admitting: Family Medicine

## 2011-01-24 ENCOUNTER — Telehealth: Payer: Self-pay

## 2011-01-24 DIAGNOSIS — Z298 Encounter for other specified prophylactic measures: Secondary | ICD-10-CM

## 2011-01-24 DIAGNOSIS — Z5112 Encounter for antineoplastic immunotherapy: Secondary | ICD-10-CM

## 2011-01-24 NOTE — Telephone Encounter (Signed)
Advise her to use the cream/ointment  Dr Scharlene Gloss office had prescribed , this worked very well in the past for her, she can ask the office to refill it, she needs to call in for that. Encourage her to continue the nexium, she needs it for reflux, hopefully the indigestion will lessen soon

## 2011-01-24 NOTE — Progress Notes (Signed)
Patient received injection in left arm with no complications 

## 2011-01-26 NOTE — Telephone Encounter (Signed)
Patient aware.

## 2011-01-28 NOTE — Progress Notes (Signed)
  Subjective:    Patient ID: Kathleen Gallegos, female    DOB: 11/08/1925, 77 y.o.   MRN: [id number]  HPI    Review of Systems     Objective:   Physical Exam        Assessment & Plan:  Pt in for weekly allergy shot, her own medication is used, no adverse reaction to injection given

## 2011-02-01 ENCOUNTER — Ambulatory Visit (INDEPENDENT_AMBULATORY_CARE_PROVIDER_SITE_OTHER): Payer: Medicare Other | Admitting: Family Medicine

## 2011-02-01 VITALS — BP 108/70 | Wt 128.4 lb

## 2011-02-01 DIAGNOSIS — J309 Allergic rhinitis, unspecified: Secondary | ICD-10-CM

## 2011-02-01 NOTE — Progress Notes (Signed)
Patient received injection in right arm SQ with no complications

## 2011-02-13 ENCOUNTER — Ambulatory Visit: Payer: Medicare Other | Admitting: Family Medicine

## 2011-02-13 VITALS — BP 110/70 | Wt 128.4 lb

## 2011-02-13 DIAGNOSIS — Z298 Encounter for other specified prophylactic measures: Secondary | ICD-10-CM

## 2011-02-14 NOTE — Progress Notes (Signed)
Patient here to get her weekly allergy injection. She received it in the left arm with no problems

## 2011-02-19 ENCOUNTER — Other Ambulatory Visit: Payer: Self-pay | Admitting: Family Medicine

## 2011-02-20 ENCOUNTER — Telehealth: Payer: Self-pay | Admitting: Family Medicine

## 2011-02-20 NOTE — Telephone Encounter (Signed)
I looked in her chart and she did get lipids done on 3/13 and 3/28. But at her Feb OV the AVS said to do labs in 3.5 months. She did it the next month and when we called her on her lipid results we told her to redo labs in 3 months and she went and had them done the end of the month.

## 2011-02-21 ENCOUNTER — Telehealth: Payer: Self-pay | Admitting: Family Medicine

## 2011-02-21 NOTE — Telephone Encounter (Signed)
Both were refilled Monday and I called the pharmacy to verify

## 2011-02-22 ENCOUNTER — Ambulatory Visit: Payer: Medicare Other

## 2011-02-23 ENCOUNTER — Ambulatory Visit (INDEPENDENT_AMBULATORY_CARE_PROVIDER_SITE_OTHER): Payer: Medicare Other | Admitting: Family Medicine

## 2011-02-23 ENCOUNTER — Other Ambulatory Visit: Payer: Self-pay | Admitting: Family Medicine

## 2011-02-23 VITALS — BP 110/62 | Wt 126.1 lb

## 2011-02-23 DIAGNOSIS — Z5112 Encounter for antineoplastic immunotherapy: Secondary | ICD-10-CM

## 2011-02-23 DIAGNOSIS — Z298 Encounter for other specified prophylactic measures: Secondary | ICD-10-CM

## 2011-02-23 NOTE — Progress Notes (Signed)
Patient received injection SQ in right arm with no complications

## 2011-03-01 ENCOUNTER — Ambulatory Visit (INDEPENDENT_AMBULATORY_CARE_PROVIDER_SITE_OTHER): Payer: Medicare Other | Admitting: Family Medicine

## 2011-03-01 DIAGNOSIS — J309 Allergic rhinitis, unspecified: Secondary | ICD-10-CM

## 2011-03-01 NOTE — Progress Notes (Signed)
Patient in for weekly allergy injection, received .5cc of allergy med in left arm Tolerated well

## 2011-03-02 NOTE — Telephone Encounter (Signed)
Noted and agree. 

## 2011-03-08 ENCOUNTER — Other Ambulatory Visit: Payer: Self-pay

## 2011-03-08 MED ORDER — CLORAZEPATE DIPOTASSIUM 7.5 MG PO TABS: ORAL_TABLET | ORAL | Status: DC

## 2011-03-13 ENCOUNTER — Ambulatory Visit (INDEPENDENT_AMBULATORY_CARE_PROVIDER_SITE_OTHER): Payer: Medicare Other | Admitting: Family Medicine

## 2011-03-13 DIAGNOSIS — Z298 Encounter for other specified prophylactic measures: Secondary | ICD-10-CM

## 2011-03-13 DIAGNOSIS — Z5112 Encounter for antineoplastic immunotherapy: Secondary | ICD-10-CM

## 2011-03-13 NOTE — Progress Notes (Signed)
.  5cc of weekly allergy med give sq in left arm  Tolerated well

## 2011-03-14 ENCOUNTER — Telehealth: Payer: Self-pay | Admitting: Internal Medicine

## 2011-03-14 ENCOUNTER — Ambulatory Visit: Payer: Self-pay | Admitting: Internal Medicine

## 2011-03-14 ENCOUNTER — Telehealth: Payer: Self-pay | Admitting: Family Medicine

## 2011-03-14 NOTE — Telephone Encounter (Signed)
I spoke with Kathleen Gallegos and she states 9/18 at 11:00 will work fine. Apt has been made. Nothing further was needed

## 2011-03-14 NOTE — Telephone Encounter (Signed)
Pt can come in 03-27-11 at 11am for 1115am appt.

## 2011-03-14 NOTE — Telephone Encounter (Signed)
Spoke with pt's daughter Aram Beecham. She states that she has to bring pt to her appts as she can not drive, and needs appt for wk of the 17th, preferably Monday or Tuesday pm. I advised nothing available, will have to ask CDY if we can work her in. Please advise, thanks!

## 2011-03-14 NOTE — Telephone Encounter (Signed)
Advised patient and daughter Dr Allyson Sabal is who Dr Lodema Hong suggest

## 2011-03-16 NOTE — Progress Notes (Signed)
  Subjective:    Patient ID: Kathleen Gallegos, female    DOB: 02/08/1926, 77 y.o.   MRN: [id number]  HPI Pt in for weekly immunotherapy. No complaints. Medication administered with no adverse effects   Review of Systems     Objective:   Physical Exam        Assessment & Plan:

## 2011-03-20 ENCOUNTER — Ambulatory Visit (INDEPENDENT_AMBULATORY_CARE_PROVIDER_SITE_OTHER): Payer: Medicare Other | Admitting: Family Medicine

## 2011-03-20 DIAGNOSIS — Z298 Encounter for other specified prophylactic measures: Secondary | ICD-10-CM

## 2011-03-20 DIAGNOSIS — Z5112 Encounter for antineoplastic immunotherapy: Secondary | ICD-10-CM

## 2011-03-20 NOTE — Progress Notes (Signed)
Patient received injection in left am with no complications

## 2011-03-22 ENCOUNTER — Ambulatory Visit: Payer: PRIVATE HEALTH INSURANCE | Admitting: Internal Medicine

## 2011-03-27 ENCOUNTER — Ambulatory Visit (INDEPENDENT_AMBULATORY_CARE_PROVIDER_SITE_OTHER): Payer: Medicare Other | Admitting: Internal Medicine

## 2011-03-27 ENCOUNTER — Encounter: Payer: Self-pay | Admitting: Internal Medicine

## 2011-03-27 VITALS — BP 124/82 | HR 72 | Ht 65.0 in | Wt 128.4 lb

## 2011-03-27 DIAGNOSIS — Z23 Encounter for immunization: Secondary | ICD-10-CM

## 2011-03-27 DIAGNOSIS — J309 Allergic rhinitis, unspecified: Secondary | ICD-10-CM

## 2011-03-27 DIAGNOSIS — J45909 Unspecified asthma, uncomplicated: Secondary | ICD-10-CM

## 2011-03-27 DIAGNOSIS — J984 Other disorders of lung: Secondary | ICD-10-CM

## 2011-03-27 NOTE — Progress Notes (Signed)
  Subjective:    Patient ID: Kathleen Gallegos, female    DOB: 08/31/1925, 77 y.o.   MRN: [id number]  HPI 03/27/2011-77 year old female never smoker followed for asthma, allergic rhinitis Last here 07/24/2010-note reviewed She notices persistent left nostril stuffiness in the mornings with watery rhinorrhea but no blood or headache. This has not gotten worse in the fall weather. She continues allergy shots believes they help her. Denies wheeze cough or shortness of breath and has not been using her rescue inhaler. Review of Systems   Constitutional:   No-   weight loss, night sweats, fevers, chills, fatigue, lassitude. HEENT:   No-  headaches, difficulty swallowing, tooth/dental problems, sore throat,       No-  sneezing, itching, ear ache,  +nasal congestion, post nasal drip,  CV:  No-   chest pain, orthopnea, PND, swelling in lower extremities, anasarca, dizziness, palpitations Resp: No-   shortness of breath with exertion or at rest.              No-   productive cough,  No non-productive cough,  No-  coughing up of blood.              No-   change in color of mucus.  No- wheezing.   Skin: No-   rash or lesions. GI:  No-   heartburn, indigestion, abdominal pain, nausea, vomiting, diarrhea,                 change in bowel habits, loss of appetite GU: No-   dysuria, change in color of urine, no urgency or frequency.  No- flank pain. MS:  No-   joint pain or swelling.  No- decreased range of motion.  No- back pain. Neuro- grossly normal to observation, Or:  Psych:  No- change in mood or affect. No depression or anxiety.  No memory loss.      Physical Exam  General- Alert, Oriented, Affect-appropriate, Distress- none acute, comfortable-appearing elderly woman, talkative Skin- rash-none, lesions- none, excoriation- none Lymphadenopathy- none Head- atraumatic            Eyes- Gross vision intact, PERRLA, conjunctivae clear secretions            Ears- Hearing, canals normal for age        Nose- Clear, no-Septal dev, mucus, polyps, erosion, perforation             Throat- Mallampati II , mucosa clear , drainage- none, tonsils- atrophic Neck- flexible , trachea midline, no stridor , thyroid nl, carotid no bruit Chest - symmetrical excursion , unlabored           Heart/CV- RRR , no murmur , no gallop  , no rub, nl s1 s2                           - JVD- none , edema- none, stasis changes- none, varices- none           Lung- clear to P&A, wheeze- none, cough- none , dullness-none, rub- none           Chest wall-  Abd- tender-no, distended-no, bowel sounds-present, HSM- no Br/ Gen/ Rectal- Not done, not indicated Extrem- cyanosis- none, clubbing, none, atrophy- none, strength- nl Neuro- grossly intact to observation

## 2011-03-27 NOTE — Patient Instructions (Addendum)
Flu vax  Ok to get allergy shot today he

## 2011-03-29 ENCOUNTER — Ambulatory Visit (INDEPENDENT_AMBULATORY_CARE_PROVIDER_SITE_OTHER): Payer: Medicare Other | Admitting: *Deleted

## 2011-03-29 DIAGNOSIS — J309 Allergic rhinitis, unspecified: Secondary | ICD-10-CM

## 2011-03-29 NOTE — Progress Notes (Signed)
.  5CC OF WEEKLY ALLERGY MED GIVEN SQ IN LEFT ARM TOLERATED WELL

## 2011-03-31 ENCOUNTER — Encounter: Payer: Self-pay | Admitting: Internal Medicine

## 2011-03-31 NOTE — Assessment & Plan Note (Signed)
Mild intermittent and well controlled on minimal medication at this time.

## 2011-03-31 NOTE — Assessment & Plan Note (Signed)
She is probably noticing some rebound from nasal drying during the night. I don't see polyps or major asymmetry between her 2 nostrils.. She can continue allergy vaccine since she believes in. I suggested she start nasal saline lavage and gel to prevent nasal dryness.

## 2011-03-31 NOTE — Assessment & Plan Note (Signed)
Little concern at this time and a nonsmoker. Anticipate followup chest x-ray in the future.

## 2011-04-01 NOTE — Progress Notes (Signed)
  Subjective:    Patient ID: Kathleen Gallegos, female    DOB: 1925-12-01, 77 y.o.   MRN: [id number]  HPI    Review of Systems     Objective:   Physical Exam        Assessment & Plan:  Pt's own serum administered, no adverse reactions noted prior to her departure from the office

## 2011-04-05 ENCOUNTER — Ambulatory Visit: Payer: Medicare Other

## 2011-04-05 DIAGNOSIS — Z298 Encounter for other specified prophylactic measures: Secondary | ICD-10-CM

## 2011-04-05 NOTE — Progress Notes (Signed)
Patient given injection with no complications. 

## 2011-04-05 NOTE — Progress Notes (Signed)
  Subjective:    Patient ID: Kathleen Gallegos, female    DOB: Jun 17, 1926, 77 y.o.   MRN: [id number]  HPI    Review of Systems     Objective:   Physical Exam        Assessment & Plan:

## 2011-04-13 LAB — CREATININE, SERUM
Creatinine, Ser: 0.88 mg/dL (ref 0.4–1.2)
GFR calc non Af Amer: >60 mL/min (ref >60)

## 2011-04-18 ENCOUNTER — Ambulatory Visit (INDEPENDENT_AMBULATORY_CARE_PROVIDER_SITE_OTHER): Payer: Medicare Other

## 2011-04-18 VITALS — BP 114/80 | Wt 127.0 lb

## 2011-04-18 DIAGNOSIS — Z5112 Encounter for antineoplastic immunotherapy: Secondary | ICD-10-CM

## 2011-04-18 DIAGNOSIS — Z298 Encounter for other specified prophylactic measures: Secondary | ICD-10-CM

## 2011-04-23 LAB — BASIC METABOLIC PANEL
BUN: 9
Chloride: 101
GFR calc non Af Amer: >60
Glucose, Bld: 68 — ABNORMAL LOW
Potassium: 3.8
Sodium: 136

## 2011-04-24 LAB — BASIC METABOLIC PANEL
CO2: 26
Calcium: 8.6
GFR calc Af Amer: >60
GFR calc non Af Amer: >60
Glucose, Bld: 88
Potassium: 4.1
Sodium: 137

## 2011-04-24 LAB — HEMOGLOBIN AND HEMATOCRIT, BLOOD: HCT: 35 — ABNORMAL LOW

## 2011-04-25 LAB — CBC
HCT: 36.2
MCV: 98.7
Platelets: 128 — ABNORMAL LOW
RBC: 3.67 — ABNORMAL LOW
WBC: 5

## 2011-04-25 LAB — DIFFERENTIAL
Eosinophils Absolute: 0.1
Lymphocytes Relative: 35
Lymphs Abs: 1.8
Monocytes Relative: 9
Neutrophils Relative %: 54

## 2011-04-26 ENCOUNTER — Ambulatory Visit (INDEPENDENT_AMBULATORY_CARE_PROVIDER_SITE_OTHER): Payer: Medicare Other

## 2011-04-26 VITALS — BP 118/70 | Wt 128.1 lb

## 2011-04-26 DIAGNOSIS — J309 Allergic rhinitis, unspecified: Secondary | ICD-10-CM

## 2011-04-26 NOTE — Progress Notes (Signed)
Injection given in right am SQ with no complications

## 2011-05-02 ENCOUNTER — Telehealth: Payer: Self-pay | Admitting: Family Medicine

## 2011-05-02 ENCOUNTER — Encounter: Payer: Self-pay | Admitting: Family Medicine

## 2011-05-03 ENCOUNTER — Encounter: Payer: Self-pay | Admitting: Family Medicine

## 2011-05-03 ENCOUNTER — Ambulatory Visit (INDEPENDENT_AMBULATORY_CARE_PROVIDER_SITE_OTHER): Payer: Medicare Other | Admitting: Family Medicine

## 2011-05-03 VITALS — BP 118/74 | HR 73 | Resp 16 | Ht 64.5 in | Wt 124.8 lb

## 2011-05-03 DIAGNOSIS — Z5112 Encounter for antineoplastic immunotherapy: Secondary | ICD-10-CM

## 2011-05-03 DIAGNOSIS — Z298 Encounter for other specified prophylactic measures: Secondary | ICD-10-CM

## 2011-05-03 DIAGNOSIS — F411 Generalized anxiety disorder: Secondary | ICD-10-CM

## 2011-05-03 DIAGNOSIS — F0390 Unspecified dementia without behavioral disturbance: Secondary | ICD-10-CM

## 2011-05-03 DIAGNOSIS — J45909 Unspecified asthma, uncomplicated: Secondary | ICD-10-CM

## 2011-05-03 DIAGNOSIS — E785 Hyperlipidemia, unspecified: Secondary | ICD-10-CM

## 2011-05-03 DIAGNOSIS — K589 Irritable bowel syndrome without diarrhea: Secondary | ICD-10-CM

## 2011-05-03 DIAGNOSIS — K219 Gastro-esophageal reflux disease without esophagitis: Secondary | ICD-10-CM

## 2011-05-03 MED ORDER — ESOMEPRAZOLE MAGNESIUM 40 MG PO CPDR: DELAYED_RELEASE_CAPSULE | ORAL | Status: DC

## 2011-05-03 MED ORDER — BUSPIRONE HCL 5 MG PO TABS: 5.0000 mg | ORAL_TABLET | Freq: Two times a day (BID) | ORAL | Status: DC

## 2011-05-03 MED ORDER — CLORAZEPATE DIPOTASSIUM 7.5 MG PO TABS: ORAL_TABLET | ORAL | Status: DC

## 2011-05-03 NOTE — Patient Instructions (Signed)
F/u in 3.5 month  No med changes at this time.   You are doing very well.  LABWORK  NEEDS TO BE DONE BETWEEN 3 TO 7 DAYS BEFORE YOUR NEXT SCEDULED  VISIT.  THIS WILL IMPROVE THE QUALITY OF YOUR CARE. Fasting lipid and hepatic in 3.5 month

## 2011-05-03 NOTE — Telephone Encounter (Signed)
Seen as office visit

## 2011-05-14 ENCOUNTER — Other Ambulatory Visit: Payer: Self-pay | Admitting: Family Medicine

## 2011-05-14 NOTE — Progress Notes (Signed)
  Subjective:    Patient ID: Kathleen Gallegos, female    DOB: April 02, 1926, 77 y.o.   MRN: [id number]  HPI The PT is here for follow up and re-evaluation of chronic medical conditions, medication management and review of any available recent lab and radiology data.  Preventive health is updated, specifically  Cancer screening and Immunization.   Questions or concerns regarding consultations or procedures which the PT has had in the interim are  addressed. The PT denies any adverse reactions to current medications since the last visit.  There are no new concerns.  Continues to c/o excessive burping and flatulence , he GO complaint is chronic, has  been investigated and is unchanged. Also c/o occasional tinnitus       Review of Systems See HPI Denies recent fever or chills. Denies sinus pressure, nasal congestion, ear pain or sore throat. Denies chest congestion, productive cough or wheezing. Denies chest pains, palpitations and leg swelling Denies abdominal pain, nausea, vomiting,diarrhea or constipation.   Denies dysuria, frequency, hesitancy or incontinence. Denies joint pain, swelling and limitation in mobility. Denies headaches, seizures, numbness, or tingling. Denies depression, anxiety or insomnia. Denies skin break down or rash.        Objective:   Physical Exam Patient alert and oriented and in no cardiopulmonary distress.  HEENT: No facial asymmetry, EOMI, no sinus tenderness,  oropharynx pink and moist.  Neck decreased though adequate ROM no adenopathy.  Chest: Clear to auscultation bilaterally.  CVS: S1, S2 no murmurs, no S3.  ABD: Soft non tender. Bowel sounds normal.  Ext: No edema  FA:[id number] though adequate:  ROM spine, shoulders, hips and knees.  Skin: Intact, no ulcerations or rash noted.  Psych: Good eye contact, normal affect. Memory mildly impairednot anxious or depressed appearing.  CNS: CN 2-12 intact, power, tone and sensation normal  throughout.        Assessment & Plan:

## 2011-05-14 NOTE — Assessment & Plan Note (Signed)
Hyperlipidemia:Low fat diet discussed and encouraged.  Controlled when last checked 

## 2011-05-14 NOTE — Assessment & Plan Note (Signed)
Improved and controlled on current regime 

## 2011-05-14 NOTE — Assessment & Plan Note (Signed)
Symptomatic but not disabling and overall doing better

## 2011-05-14 NOTE — Assessment & Plan Note (Signed)
perenial allergies on immunotherapy x ears , same administered at visit

## 2011-05-14 NOTE — Assessment & Plan Note (Signed)
Maintained off medication with good function

## 2011-05-17 ENCOUNTER — Ambulatory Visit (INDEPENDENT_AMBULATORY_CARE_PROVIDER_SITE_OTHER): Payer: Medicare Other

## 2011-05-17 VITALS — BP 110/70 | Wt 128.0 lb

## 2011-05-17 DIAGNOSIS — J309 Allergic rhinitis, unspecified: Secondary | ICD-10-CM

## 2011-05-17 NOTE — Progress Notes (Signed)
Received injection with no complications  

## 2011-05-24 ENCOUNTER — Ambulatory Visit (INDEPENDENT_AMBULATORY_CARE_PROVIDER_SITE_OTHER): Payer: Medicare Other

## 2011-05-24 DIAGNOSIS — J309 Allergic rhinitis, unspecified: Secondary | ICD-10-CM

## 2011-05-30 ENCOUNTER — Ambulatory Visit (INDEPENDENT_AMBULATORY_CARE_PROVIDER_SITE_OTHER): Payer: Medicare Other

## 2011-05-30 DIAGNOSIS — J309 Allergic rhinitis, unspecified: Secondary | ICD-10-CM

## 2011-05-30 NOTE — Progress Notes (Signed)
Patient received allergy injection with no complications

## 2011-06-07 ENCOUNTER — Ambulatory Visit (INDEPENDENT_AMBULATORY_CARE_PROVIDER_SITE_OTHER): Payer: Medicare Other

## 2011-06-07 ENCOUNTER — Ambulatory Visit (INDEPENDENT_AMBULATORY_CARE_PROVIDER_SITE_OTHER): Payer: Medicare Other | Admitting: Psychology

## 2011-06-07 ENCOUNTER — Encounter (HOSPITAL_COMMUNITY): Payer: Self-pay | Admitting: Psychology

## 2011-06-07 DIAGNOSIS — J309 Allergic rhinitis, unspecified: Secondary | ICD-10-CM

## 2011-06-07 DIAGNOSIS — F4322 Adjustment disorder with anxiety: Secondary | ICD-10-CM

## 2011-06-07 DIAGNOSIS — F450 Somatization disorder: Secondary | ICD-10-CM

## 2011-06-07 NOTE — Progress Notes (Signed)
Received allergy injection with no complications

## 2011-06-07 NOTE — Progress Notes (Signed)
Patient:  Kathleen Gallegos   DOB: 09-Apr-1926  MR Number: [id number]  Location: [facility] [address] [place] [address] Dept: [phone]  Start: 1 PM End: 2 PM  Provider/Observer:     Hershal Coria PSYD  Chief Complaint:      Chief Complaint  Patient presents with  . Anxiety  . Agitation  . Other    adjustment disorder    Reason For Service:     The patient was referred for psychotherapeutic interventions do to increasing worry and anxiety as well as difficulty adjusting to life changes and aging issues.  Interventions Strategy:  Cognitive/behavioral psychotherapy  Participation Level:   Active  Participation Quality:  Appropriate      Behavioral Observation:  Well Groomed, Alert, and Appropriate and Tearful.   Current Psychosocial Factors: The patient reports that she is continuing to have some difficulty with her daughter. They are working on how to cope with each other but there is continued conflict on this issue. The patient reports that she has continued to improve as far as her somatizations features but she does continue to become overly excessive and worried about how she is doing medically.  Content of Session:   Reviewed current symptoms and continue to work on therapeutic interventions with particular focus on her somatizations disorder.  Current Status:   The patient does continue to improve but has been having some more adjustment difficulties recently.  Patient Progress:   Very good  Target Goals:   Target goals include reducing the frequency, intensity, and duration of her hyper focus and hypervigilance on her somatic issues. 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  Last Reviewed:   06/07/2011  Goals Addressed Today:    Today we were primarily with a somatizations issues.  Impression/Diagnosis:   At this point, the patient does continue to show some significant adjustment difficulties and issues but this does not appear to be anything long-standing. There are no cognitive difficulties or indications of dementia. The patient is in fact relatively healthy. However, there is a continual hyper focus and hypervigilance over her [facility] status and she is continuing to be very concerned about his issues particular her GI functioning.  Diagnosis:    Axis I:  1. Adjustment disorder with anxiety   2. Somatization disorder         Axis II: No diagnosis

## 2011-06-07 NOTE — Progress Notes (Signed)
Injection given with no complications 

## 2011-06-13 ENCOUNTER — Ambulatory Visit (INDEPENDENT_AMBULATORY_CARE_PROVIDER_SITE_OTHER): Payer: Medicare Other

## 2011-06-13 DIAGNOSIS — J309 Allergic rhinitis, unspecified: Secondary | ICD-10-CM

## 2011-06-15 ENCOUNTER — Ambulatory Visit: Payer: Medicare Other

## 2011-06-21 ENCOUNTER — Ambulatory Visit (INDEPENDENT_AMBULATORY_CARE_PROVIDER_SITE_OTHER): Payer: Medicare Other

## 2011-06-21 ENCOUNTER — Encounter (HOSPITAL_COMMUNITY): Payer: Medicare Other | Admitting: Psychology

## 2011-06-21 VITALS — BP 110/72 | Wt 131.0 lb

## 2011-06-21 DIAGNOSIS — J309 Allergic rhinitis, unspecified: Secondary | ICD-10-CM

## 2011-06-26 ENCOUNTER — Ambulatory Visit (INDEPENDENT_AMBULATORY_CARE_PROVIDER_SITE_OTHER): Payer: Medicare Other

## 2011-06-26 DIAGNOSIS — J309 Allergic rhinitis, unspecified: Secondary | ICD-10-CM

## 2011-06-27 ENCOUNTER — Ambulatory Visit: Payer: Medicare Other

## 2011-06-27 VITALS — BP 122/68 | Wt 130.1 lb

## 2011-06-27 DIAGNOSIS — J309 Allergic rhinitis, unspecified: Secondary | ICD-10-CM

## 2011-06-28 ENCOUNTER — Ambulatory Visit (HOSPITAL_COMMUNITY): Payer: Medicare Other | Admitting: Psychology

## 2011-07-07 ENCOUNTER — Other Ambulatory Visit: Payer: Self-pay | Admitting: Family Medicine

## 2011-07-13 ENCOUNTER — Ambulatory Visit (INDEPENDENT_AMBULATORY_CARE_PROVIDER_SITE_OTHER): Payer: Medicare Other

## 2011-07-13 VITALS — BP 136/68 | Wt 133.1 lb

## 2011-07-13 DIAGNOSIS — J309 Allergic rhinitis, unspecified: Secondary | ICD-10-CM | POA: Diagnosis not present

## 2011-07-13 NOTE — Progress Notes (Signed)
Pt in for scheduled allergy injection.  Injection given in right arm with no complications.

## 2011-07-16 DIAGNOSIS — E785 Hyperlipidemia, unspecified: Secondary | ICD-10-CM | POA: Diagnosis not present

## 2011-07-16 LAB — HEPATIC FUNCTION PANEL
ALT: 16 U/L (ref 0–35)
Albumin: 4.2 g/dL (ref 3.5–5.2)
Total Protein: 7.7 g/dL (ref 6.0–8.3)

## 2011-07-16 LAB — LIPID PANEL
Cholesterol: 165 mg/dL (ref 0–200)
LDL Cholesterol: 77 mg/dL (ref 0–99)
Total CHOL/HDL Ratio: 2 Ratio
Triglycerides: 35 mg/dL (ref <150)
VLDL: 7 mg/dL (ref 0–40)

## 2011-07-20 ENCOUNTER — Ambulatory Visit: Payer: Medicare Other

## 2011-07-23 ENCOUNTER — Ambulatory Visit: Payer: Self-pay | Admitting: Internal Medicine

## 2011-07-25 ENCOUNTER — Encounter: Payer: Self-pay | Admitting: Family Medicine

## 2011-07-26 ENCOUNTER — Ambulatory Visit (INDEPENDENT_AMBULATORY_CARE_PROVIDER_SITE_OTHER): Payer: Medicare Other | Admitting: Family Medicine

## 2011-07-26 ENCOUNTER — Other Ambulatory Visit: Payer: Self-pay | Admitting: Family Medicine

## 2011-07-26 ENCOUNTER — Encounter: Payer: Self-pay | Admitting: Family Medicine

## 2011-07-26 VITALS — BP 122/82 | HR 63 | Resp 16 | Ht 64.0 in | Wt 130.8 lb

## 2011-07-26 DIAGNOSIS — N390 Urinary tract infection, site not specified: Secondary | ICD-10-CM

## 2011-07-26 DIAGNOSIS — R002 Palpitations: Secondary | ICD-10-CM

## 2011-07-26 DIAGNOSIS — F411 Generalized anxiety disorder: Secondary | ICD-10-CM

## 2011-07-26 DIAGNOSIS — J309 Allergic rhinitis, unspecified: Secondary | ICD-10-CM

## 2011-07-26 DIAGNOSIS — E785 Hyperlipidemia, unspecified: Secondary | ICD-10-CM

## 2011-07-26 DIAGNOSIS — M949 Disorder of cartilage, unspecified: Secondary | ICD-10-CM

## 2011-07-26 DIAGNOSIS — R5381 Other malaise: Secondary | ICD-10-CM

## 2011-07-26 DIAGNOSIS — L259 Unspecified contact dermatitis, unspecified cause: Secondary | ICD-10-CM

## 2011-07-26 DIAGNOSIS — R5383 Other fatigue: Secondary | ICD-10-CM

## 2011-07-26 DIAGNOSIS — M899 Disorder of bone, unspecified: Secondary | ICD-10-CM

## 2011-07-26 DIAGNOSIS — K219 Gastro-esophageal reflux disease without esophagitis: Secondary | ICD-10-CM

## 2011-07-26 DIAGNOSIS — G4700 Insomnia, unspecified: Secondary | ICD-10-CM

## 2011-07-26 MED ORDER — METOPROLOL SUCCINATE ER 25 MG PO TB24: ORAL_TABLET | ORAL | Status: DC

## 2011-07-26 NOTE — Assessment & Plan Note (Signed)
Controlled, no change in medication  

## 2011-07-26 NOTE — Assessment & Plan Note (Signed)
Worsened in hands and on soles, will refer to dermatology

## 2011-07-26 NOTE — Assessment & Plan Note (Signed)
Improved continue buspar

## 2011-07-26 NOTE — Patient Instructions (Signed)
F/u in 4 months  You are doing extremely well, no changes in medication.  You are being referred to dermatology about your hands and feet   Fasting lipid, chem 7, hepatic, TSH, cbc and vit D  In 4months

## 2011-07-27 DIAGNOSIS — N390 Urinary tract infection, site not specified: Secondary | ICD-10-CM | POA: Diagnosis not present

## 2011-07-27 LAB — POCT URINALYSIS DIPSTICK
Blood, UA: NEGATIVE
Protein, UA: NEGATIVE
Spec Grav, UA: 1.025
Urobilinogen, UA: 0.2

## 2011-07-29 NOTE — Assessment & Plan Note (Signed)
Continues to rely on anxiolytic

## 2011-07-29 NOTE — Assessment & Plan Note (Signed)
Controlled with immunotherapy receives injections in this office weekly

## 2011-07-29 NOTE — Assessment & Plan Note (Signed)
Asymptomatic, controlled on beta blocker

## 2011-07-29 NOTE — Progress Notes (Signed)
  Subjective:    Patient ID: Kathleen Gallegos, female    DOB: Aug 22, 1925, 78 y.o.   MRN: [id number]  HPI The PT is here for follow up and re-evaluation of chronic medical conditions, medication management and review of any available recent lab and radiology data.  Preventive health is updated, specifically  Cancer screening and Immunization.   Questions or concerns regarding consultations or procedures which the PT has had in the interim are  addressed. The PT denies any adverse reactions to current medications shere are no specific complaintsince the last visit.  There are no new concerns.  Very concerned about worsening cracking and dryness of particularly her hands , but also the soles of her feet. Due to the severity , a return visit to dermatology is warranted Reports she feels as though he is doing well overall. Her continued stated "problem' is living with her adult daughter,and her attempts at down sizing to smaller property.      Review of Systems See HPI Denies recent fever or chills. Denies sinus pressure, nasal congestion, ear pain or sore throat.Controlled allergy symptoms with immunotherapy Denies chest congestion, productive cough or wheezing. Denies chest pains, palpitations and leg swelling Denies abdominal pain, nausea, vomiting,diarrhea or constipation.   Denies dysuria, frequency, hesitancy or incontinence. Mild joint pain, no  limitation in mobility. Denies headaches, seizures, numbness, or tingling. Denies uncontrolled  depression, anxiety or insomnia.         Objective:   Physical Exam Patient alert and oriented and in no cardiopulmonary distress.  HEENT: No facial asymmetry, EOMI, no sinus tenderness,  oropharynx pink and moist.  Neck supple no adenopathy.  Chest: Clear to auscultation bilaterally.  CVS: S1, S2 no murmurs, no S3.  ABD: Soft non tender. Bowel sounds normal.  Ext: No edema  MS: Adequate though reduced  ROM spine, shoulders, hips  and knees.  Skin: severely dry and cracked palms and to a lesser extent soles Psych: Good eye contact, normal affect. Memory mildly impaired  not anxious or depressed appearing.  CNS: CN 2-12 intact, power, tone and sensation normal throughout.        Assessment & Plan:

## 2011-07-29 NOTE — Assessment & Plan Note (Signed)
Controlled, no change in medication  

## 2011-08-01 ENCOUNTER — Telehealth: Payer: Self-pay | Admitting: Family Medicine

## 2011-08-01 NOTE — Telephone Encounter (Signed)
Do you agree with her going to Dr. Margo Aye

## 2011-08-02 ENCOUNTER — Ambulatory Visit (INDEPENDENT_AMBULATORY_CARE_PROVIDER_SITE_OTHER): Payer: Medicare Other

## 2011-08-02 VITALS — BP 120/60 | Wt 130.8 lb

## 2011-08-02 DIAGNOSIS — Z298 Encounter for other specified prophylactic measures: Secondary | ICD-10-CM

## 2011-08-02 DIAGNOSIS — Z5112 Encounter for antineoplastic immunotherapy: Secondary | ICD-10-CM

## 2011-08-02 NOTE — Telephone Encounter (Signed)
I referred her to Dr Margo Aye at the last visit, so YES. Use the allergy meds fopr the drainage

## 2011-08-02 NOTE — Telephone Encounter (Signed)
noted 

## 2011-08-02 NOTE — Progress Notes (Signed)
Pt received injection SQ in left arm with no complications

## 2011-08-03 ENCOUNTER — Other Ambulatory Visit: Payer: Self-pay

## 2011-08-03 ENCOUNTER — Other Ambulatory Visit: Payer: Self-pay | Admitting: Family Medicine

## 2011-08-03 MED ORDER — CIPROFLOXACIN HCL 500 MG PO TABS: 500.0000 mg | ORAL_TABLET | Freq: Two times a day (BID) | ORAL | Status: DC

## 2011-08-09 DIAGNOSIS — L851 Acquired keratosis [keratoderma] palmaris et plantaris: Secondary | ICD-10-CM | POA: Diagnosis not present

## 2011-08-10 ENCOUNTER — Encounter: Payer: Self-pay | Admitting: Internal Medicine

## 2011-08-10 ENCOUNTER — Ambulatory Visit (INDEPENDENT_AMBULATORY_CARE_PROVIDER_SITE_OTHER): Payer: Medicare Other | Admitting: Internal Medicine

## 2011-08-10 VITALS — BP 124/70 | HR 63 | Ht 65.0 in | Wt 132.4 lb

## 2011-08-10 DIAGNOSIS — J309 Allergic rhinitis, unspecified: Secondary | ICD-10-CM

## 2011-08-10 DIAGNOSIS — J45909 Unspecified asthma, uncomplicated: Secondary | ICD-10-CM

## 2011-08-10 NOTE — Patient Instructions (Signed)
Continue present treatment and allergy vaccine- please call as needed

## 2011-08-10 NOTE — Progress Notes (Signed)
Patient ID: Kathleen Gallegos, female    DOB: 1925/11/01, 78 y.o.   MRN: [id number]  HPI 03/27/2011-78 year old female never smoker followed for asthma, allergic rhinitis Last here 07/24/2010-note reviewed She notices persistent left nostril stuffiness in the mornings with watery rhinorrhea but no blood or headache. This has not gotten worse in the fall weather. She continues allergy shots, believes they help her. Denies wheeze cough or shortness of breath and has not been using her rescue inhaler.  06/08/12--78 year old female never smoker followed for asthma, allergic rhinitis No significant respiratory infections this winter. Her primary physician gave Cipro for urinary infection ending yesterday. This also improved her sinus drainage which is noted mostly when she is lying down. She denies cough and says her chest feels clear.  Review of Systems   Constitutional:   No-   weight loss, night sweats, fevers, chills, fatigue, lassitude. HEENT:   No-  headaches, difficulty swallowing, tooth/dental problems, sore throat,       No-  sneezing, itching, ear ache,  +nasal congestion, post nasal drip,  CV:  No-   chest pain, orthopnea, PND, swelling in lower extremities, anasarca, dizziness, palpitations Resp: No-   shortness of breath with exertion or at rest.              No-   productive cough,  No non-productive cough,  No-  coughing up of blood.              No-   change in color of mucus.  No- wheezing.   Skin: No-   rash or lesions. GI:  No-   heartburn, indigestion, abdominal pain, nausea, vomiting, diarrhea,                 change in bowel habits, loss of appetite GU: MS:  No-   joint pain or swelling.  No- decreased range of motion.  No- back pain. Neuro-  Psych:  No- change in mood or affect. No depression or anxiety.  No memory loss.      Physical Exam  General- Alert, Oriented, Affect-appropriate, Distress- none acute, comfortable-appearing elderly woman, talkative Skin-  rash-none, lesions- none, excoriation- none Lymphadenopathy- none Head- atraumatic            Eyes- Gross vision intact, PERRLA, conjunctivae clear secretions            Ears- Hearing, canals normal for age            Nose- Clear, no-Septal dev, mucus, polyps, erosion, perforation             Throat- Mallampati II , mucosa clear , drainage- none, tonsils- atrophic, missing teeth Neck- flexible , trachea midline, no stridor , thyroid nl, carotid no bruit Chest - symmetrical excursion , unlabored           Heart/CV- RRR , no murmur , no gallop  , no rub, nl s1 s2                           - JVD- none , edema- none, stasis changes- none, varices- none           Lung- clear to P&A, wheeze- none, cough- none , dullness-none, rub- none           Chest wall-  Abd-  Br/ Gen/ Rectal- Not done, not indicated Extrem- cyanosis- none, clubbing, none, atrophy- none, strength- nl Neuro- grossly intact to observation

## 2011-08-14 NOTE — Assessment & Plan Note (Signed)
Mild intermittent asthma with good control 

## 2011-08-14 NOTE — Assessment & Plan Note (Signed)
We will watch for seasonal allergy complaints as spring comes. She describes improvement in postnasal drip while on Cipro. A low grade sinus infection is suggested by the history.

## 2011-08-15 ENCOUNTER — Ambulatory Visit (INDEPENDENT_AMBULATORY_CARE_PROVIDER_SITE_OTHER): Payer: Medicare Other

## 2011-08-15 VITALS — BP 140/80 | Wt 132.1 lb

## 2011-08-15 DIAGNOSIS — J309 Allergic rhinitis, unspecified: Secondary | ICD-10-CM

## 2011-08-15 NOTE — Progress Notes (Signed)
Pt in for allergy injection.  Given in right arm with no complications.

## 2011-08-22 ENCOUNTER — Ambulatory Visit (INDEPENDENT_AMBULATORY_CARE_PROVIDER_SITE_OTHER): Payer: Medicare Other

## 2011-08-22 DIAGNOSIS — J309 Allergic rhinitis, unspecified: Secondary | ICD-10-CM

## 2011-08-29 ENCOUNTER — Other Ambulatory Visit: Payer: Self-pay | Admitting: Family Medicine

## 2011-08-29 ENCOUNTER — Ambulatory Visit (INDEPENDENT_AMBULATORY_CARE_PROVIDER_SITE_OTHER): Payer: Medicare Other

## 2011-08-29 DIAGNOSIS — Z139 Encounter for screening, unspecified: Secondary | ICD-10-CM

## 2011-08-29 DIAGNOSIS — J309 Allergic rhinitis, unspecified: Secondary | ICD-10-CM

## 2011-08-29 NOTE — Progress Notes (Signed)
Pt in for allergy injection given in right arm with no voiced complications.  No sign or symptom of adverse reaction.

## 2011-08-30 ENCOUNTER — Ambulatory Visit (HOSPITAL_COMMUNITY)
Admission: RE | Admit: 2011-08-30 | Discharge: 2011-08-30 | Disposition: A | Discharge status: Home / Self Care | Payer: Medicare Other | Source: Ambulatory Visit | Attending: Family Medicine | Admitting: Family Medicine

## 2011-08-30 DIAGNOSIS — Z139 Encounter for screening, unspecified: Secondary | ICD-10-CM

## 2011-08-30 DIAGNOSIS — Z1231 Encounter for screening mammogram for malignant neoplasm of breast: Secondary | ICD-10-CM | POA: Insufficient documentation

## 2011-08-31 ENCOUNTER — Ambulatory Visit (INDEPENDENT_AMBULATORY_CARE_PROVIDER_SITE_OTHER): Payer: Medicare Other | Admitting: Psychology

## 2011-08-31 DIAGNOSIS — F4322 Adjustment disorder with anxiety: Secondary | ICD-10-CM

## 2011-08-31 DIAGNOSIS — F450 Somatization disorder: Secondary | ICD-10-CM

## 2011-09-03 ENCOUNTER — Other Ambulatory Visit: Payer: Self-pay | Admitting: Family Medicine

## 2011-09-06 ENCOUNTER — Ambulatory Visit (INDEPENDENT_AMBULATORY_CARE_PROVIDER_SITE_OTHER): Payer: Medicare Other

## 2011-09-06 DIAGNOSIS — J309 Allergic rhinitis, unspecified: Secondary | ICD-10-CM | POA: Diagnosis not present

## 2011-09-06 DIAGNOSIS — Z298 Encounter for other specified prophylactic measures: Secondary | ICD-10-CM

## 2011-09-06 NOTE — Progress Notes (Signed)
Patient received injection in left arm with no signs of complication

## 2011-09-13 ENCOUNTER — Ambulatory Visit (INDEPENDENT_AMBULATORY_CARE_PROVIDER_SITE_OTHER): Payer: Medicare Other

## 2011-09-13 ENCOUNTER — Telehealth: Payer: Self-pay | Admitting: Family Medicine

## 2011-09-13 DIAGNOSIS — J309 Allergic rhinitis, unspecified: Secondary | ICD-10-CM

## 2011-09-13 NOTE — Progress Notes (Signed)
Pt received injection in right arm with no complications  

## 2011-09-22 ENCOUNTER — Emergency Department (HOSPITAL_COMMUNITY): Payer: Medicare Other

## 2011-09-22 ENCOUNTER — Telehealth: Payer: Self-pay | Admitting: Family Medicine

## 2011-09-22 ENCOUNTER — Encounter (HOSPITAL_COMMUNITY): Payer: Self-pay | Admitting: *Deleted

## 2011-09-22 ENCOUNTER — Emergency Department (HOSPITAL_COMMUNITY)
Admission: EM | Admit: 2011-09-22 | Discharge: 2011-09-22 | Disposition: A | Discharge status: Home / Self Care | Payer: Medicare Other | Attending: Emergency Medicine | Admitting: Emergency Medicine

## 2011-09-22 DIAGNOSIS — D696 Thrombocytopenia, unspecified: Secondary | ICD-10-CM | POA: Diagnosis not present

## 2011-09-22 DIAGNOSIS — Z79899 Other long term (current) drug therapy: Secondary | ICD-10-CM | POA: Insufficient documentation

## 2011-09-22 DIAGNOSIS — E785 Hyperlipidemia, unspecified: Secondary | ICD-10-CM | POA: Diagnosis not present

## 2011-09-22 DIAGNOSIS — K589 Irritable bowel syndrome without diarrhea: Secondary | ICD-10-CM | POA: Diagnosis not present

## 2011-09-22 DIAGNOSIS — K56609 Unspecified intestinal obstruction, unspecified as to partial versus complete obstruction: Secondary | ICD-10-CM | POA: Diagnosis not present

## 2011-09-22 DIAGNOSIS — R109 Unspecified abdominal pain: Secondary | ICD-10-CM | POA: Diagnosis not present

## 2011-09-22 DIAGNOSIS — J45909 Unspecified asthma, uncomplicated: Secondary | ICD-10-CM | POA: Insufficient documentation

## 2011-09-22 DIAGNOSIS — K5900 Constipation, unspecified: Secondary | ICD-10-CM | POA: Insufficient documentation

## 2011-09-22 DIAGNOSIS — G8929 Other chronic pain: Secondary | ICD-10-CM | POA: Insufficient documentation

## 2011-09-22 DIAGNOSIS — J9819 Other pulmonary collapse: Secondary | ICD-10-CM | POA: Diagnosis not present

## 2011-09-22 DIAGNOSIS — K449 Diaphragmatic hernia without obstruction or gangrene: Secondary | ICD-10-CM | POA: Diagnosis not present

## 2011-09-22 DIAGNOSIS — R1031 Right lower quadrant pain: Secondary | ICD-10-CM | POA: Diagnosis not present

## 2011-09-22 HISTORY — DX: Gastro-esophageal reflux disease without esophagitis: K21.9

## 2011-09-22 LAB — CBC
HCT: 36.4 % (ref 36.0–46.0)
Hemoglobin: 12.3 g/dL (ref 12.0–15.0)
MCV: 99.2 fL (ref 78.0–100.0)
RBC: 3.67 MIL/uL — ABNORMAL LOW (ref 3.87–5.11)
WBC: 3.8 10*3/uL — ABNORMAL LOW (ref 4.0–10.5)

## 2011-09-22 LAB — COMPREHENSIVE METABOLIC PANEL
ALT: 18 U/L (ref 0–35)
CO2: 29 mEq/L (ref 19–32)
Calcium: 9.7 mg/dL (ref 8.4–10.5)
GFR calc Af Amer: 69 mL/min — ABNORMAL LOW (ref >90)
GFR calc non Af Amer: 59 mL/min — ABNORMAL LOW (ref >90)
Glucose, Bld: 85 mg/dL (ref 70–99)
Sodium: 137 mEq/L (ref 135–145)

## 2011-09-22 LAB — DIFFERENTIAL
Eosinophils Relative: 1 % (ref 0–5)
Lymphocytes Relative: 36 % (ref 12–46)
Lymphs Abs: 1.4 10*3/uL (ref 0.7–4.0)
Monocytes Absolute: 0.4 10*3/uL (ref 0.1–1.0)

## 2011-09-22 LAB — LIPASE, BLOOD: Lipase: 18 U/L (ref 11–59)

## 2011-09-22 LAB — URINE MICROSCOPIC-ADD ON

## 2011-09-22 LAB — URINALYSIS, ROUTINE W REFLEX MICROSCOPIC
Glucose, UA: NEGATIVE mg/dL
Specific Gravity, Urine: 1.02 (ref 1.005–1.030)

## 2011-09-22 MED ORDER — SODIUM CHLORIDE 0.9 % IV SOLN
INTRAVENOUS | Status: DC
  Administered 2011-09-22: 15:00:00 via INTRAVENOUS

## 2011-09-22 MED ORDER — IOHEXOL 300 MG/ML  SOLN
100.0000 mL | Freq: Once | INTRAMUSCULAR | Status: AC | PRN
  Administered 2011-09-22: 100 mL via INTRAVENOUS

## 2011-09-22 NOTE — Discharge Instructions (Signed)
RESOURCE GUIDE  Dental Problems  Patients with Medicaid: [facility]                     [facility] [address]                                           [address] Phone:  [phone]                                                  Phone:  [phone]  If unable to pay or uninsured, contact:  [facility] or [facility] to become qualified for the adult dental clinic.  Chronic Pain Problems Contact [facility]  [phone] Patients need to be referred by their primary care doctor.  Insufficient Money for Medicine Contact [employer]:  call "211" or [facility] 271-5999.  No Primary Care Doctor Call [facility]  [phone] Other agencies that provide inexpensive medical care    [facility]  [phone]    [facility]  [phone]    [facility]  [phone]    Women's Clinic  [phone]    [facility]  [phone]    [facility]  [phone]  Psychological Services [facility]  [phone] [facility]  [phone] [facility]   [phone] (emergency services [phone])  Substance Abuse Resources [facility]  336-882-2125 [facility] 336-784-9470 [facility] 336-285-9073 [facility] 336-845-3988 Residential & Outpatient Substance Abuse Program  800-659-3381  Abuse/Neglect [place] Child Abuse Hotline (336) 641-3795 [place] Child Abuse Hotline 800-378-5315 (After Hours)  Emergency Shelter [facility] (336) 271-5985  Maternity Homes [facility] (336) 275-9566 [facility] (704) 372-4663  MRSA Hotline #:   832-7006    [place] [facility]  [facility] [place]     [employer]                          [facility] [address] [place]                       [address]      [address]  [place]                                                [place]                            [place] Phone:  [phone]                                   Phone:  [phone]                 Phone:  [phone]  [facility] Phone:  [phone]    [facility] Child Abuse Hotline 3091436876 631 481 6316 (After Hours)    Take over the counter laxative (such as miralax, milk of magnesia, senokot) and repeat tomorrow.  Begin to take over the counter stool softener (colace), as directed on packaging, for the next month.  Continue to take your usual prescriptions as previously directed.  Call your regular medical doctor and your GI doctor on Monday to schedule a follow up appointment within the next week.  Return to the Emergency Department immediately if worsening.

## 2011-09-22 NOTE — ED Notes (Signed)
Pain to RLQ. Sent by PMD.First noticed 1 mo ago, pain is mostly in the mornings but not relieved by nexium, per pt. Denies N/V or fever.

## 2011-09-22 NOTE — ED Notes (Signed)
Pt presents with intermittent RUQ pain x 3 weeks. Pt denies n/v/d. NAD at this time. Pt states she is currently under the care of Dr [name]. Pt a/ox4. Resp even and unlabored. IV established and blood sent to lab. Urine obtained and sent to lab. Pt lying supine in stretcher. Stretcher in low locked position. Side rail up for pt safety. Call light within reach. Education on plan of care provided. Pt verbalized understanding. Awaiting MD evaluation and orders. Will continue to monitor.

## 2011-09-22 NOTE — ED Provider Notes (Signed)
History     CSN: [id number]  Arrival date & time 09/22/11  1212   First MD Initiated Contact with Patient 09/22/11 1359      Chief Complaint  Patient presents with  . Abdominal Pain    HPI Pt was seen at 1405.  Per pt, c/o gradual onset and persistence of constant right sided abd "pain" for the past 1 month.  Describes the pain as "dull," located mostly in her RLQ.  Denies N/V/D, no CP/SOB, no cough, no fevers, no rash, no flank pain.     Past Medical History  Diagnosis Date  . IBS (irritable bowel syndrome)   . Anxiety disorder   . Hyperlipidemia   . Lung nodule   . Angioneurotic edema not elsewhere classified   . Allergic rhinitis, cause unspecified   . Asthma   . Carpal tunnel syndrome   . Chronic abdominal pain     Past Surgical History  Procedure Date  . Abdominal hysterectomy 1979    fibroids   . Colonoscopy 2006    normal -redundant   . Biopsy thyroid 1970   . Bilateral cataract surgery 2008    Family History  Problem Relation Age of Onset  . Hypertension Mother   . Pancreatic cancer Father   . Cancer Brother   . Leukemia      family history   . Colon cancer      family history     History  Substance Use Topics  . Smoking status: Never Smoker   . Smokeless tobacco: Not on file  . Alcohol Use: No    Review of Systems ROS: Statement: All systems negative except as marked or noted in the HPI; Constitutional: Negative for fever and chills. ; ; Eyes: Negative for eye pain, redness and discharge. ; ; ENMT: Negative for ear pain, hoarseness, nasal congestion, sinus pressure and sore throat. ; ; Cardiovascular: Negative for chest pain, palpitations, diaphoresis, dyspnea and peripheral edema. ; ; Respiratory: Negative for cough, wheezing and stridor. ; ; Gastrointestinal: +abd pain. Negative for nausea, vomiting, diarrhea, blood in stool, hematemesis, jaundice and rectal bleeding. . ; ; Genitourinary: Negative for dysuria, flank pain and hematuria. ; ;  Musculoskeletal: Negative for back pain and neck pain. Negative for swelling and trauma.; ; Skin: Negative for pruritus, rash, abrasions, blisters, bruising and skin lesion.; ; Neuro: Negative for headache, lightheadedness and neck stiffness. Negative for weakness, altered level of consciousness , altered mental status, extremity weakness, paresthesias, involuntary movement, seizure and syncope.     Allergies  Diphenhydramine hcl and Rivastigmine tartrate  Home Medications   Current Outpatient Rx  Name Route Sig Dispense Refill  . ADVAIR DISKUS 250-50 MCG/DOSE IN AEPB  INHALE 1 PUFF TWICE DAILY. RINSE AFTER USE 60 each 2  . BUSPIRONE HCL 5 MG PO TABS Oral Take 1 tablet (5 mg total) by mouth 2 (two) times daily. 60 tablet 4  . CALCIUM CARBONATE 600 MG PO TABS Oral Take 600 mg by mouth 2 (two) times daily with a meal.     . REFRESH OPTIVE ADVANCED OP Ophthalmic Apply to eye. 1-2 droops in each eye as needed/directed     . CLORAZEPATE DIPOTASSIUM 7.5 MG PO TABS  Take 1/2 tab in the am and 1 tab at bedtime 45 tablet 4  . DOCUSATE SODIUM 100 MG PO CAPS Oral Take 100 mg by mouth as needed.      Marland Kitchen EPINEPHRINE 0.3 MG/0.3ML IJ DEVI Intramuscular Inject 0.3 mg into the muscle  once. For severe allergic reaction     . ESOMEPRAZOLE MAGNESIUM 40 MG PO CPDR  TAKE ONE CAPSULE BY MOUTH EVERY DAY 30 capsule 4  . METOPROLOL SUCCINATE ER 25 MG PO TB24  TAKE ONE HALF TABLET BY MOUTH TWICE DAILY 30 tablet 4  . METOPROLOL TARTRATE 25 MG PO TABS  1/2 tab twice daily     . POLYETHYLENE GLYCOL 3350 PO POWD Oral Take 17 g by mouth daily. Take half dose daily as needed       . PRAVASTATIN SODIUM 40 MG PO TABS  TAKE ONE TABLET BY MOUTH NIGHTLY AT BEDTIME 30 tablet 3  . ALIGN PO Oral Take by mouth daily.      . BENEFIBER DRINK MIX PO PACK Oral Take by mouth. Dissolve 2 teaspoons in at least 8 ounces of water of juice drink daily         BP 146/80  Pulse 73  Temp(Src) 97.8 F (36.6 C) (Oral)  Resp 20  Ht 5\' 8"   (1.727 m)  Wt 133 lb (60.328 kg)  BMI 20.22 kg/m2  SpO2 100%  Physical Exam 1410: Physical examination:  Nursing notes reviewed; Vital signs and O2 SAT reviewed;  Constitutional: Well developed, Well nourished, Well hydrated, In no acute distress; Head:  Normocephalic, atraumatic; Eyes: EOMI, PERRL, No scleral icterus; ENMT: Mouth and pharynx normal, Mucous membranes moist; Neck: Supple, Full range of motion, No lymphadenopathy; Cardiovascular: Regular rate and rhythm, No murmur, rub, or gallop; Respiratory: Breath sounds clear & equal bilaterally, No rales, rhonchi, wheezes, or rub, Normal respiratory effort/excursion; Chest: Nontender, Movement normal; Abdomen: Soft, +mild RLQ>RUQ tenderness to palp, no rebound or guarding, Nondistended, Normal bowel sounds; Extremities: Pulses normal, No tenderness, No edema, No calf edema or asymmetry.; Neuro: AA&Ox3, Major CN grossly intact. Speech clear. No gross focal motor or sensory deficits in extremities.; Skin: Color normal, Warm, Dry, no rash.   ED Course  Procedures   MDM  MDM Reviewed: nursing note and vitals Interpretation: labs, CT scan and x-ray   Results for orders placed during the hospital encounter of 09/22/11  URINALYSIS, ROUTINE W REFLEX MICROSCOPIC      Component Value Range   Color, Urine YELLOW  YELLOW    APPearance CLEAR  CLEAR    Specific Gravity, Urine 1.020  1.005 - 1.030    pH 6.0  5.0 - 8.0    Glucose, UA NEGATIVE  NEGATIVE (mg/dL)   Hgb urine dipstick SMALL (*) NEGATIVE    Bilirubin Urine NEGATIVE  NEGATIVE    Ketones, ur NEGATIVE  NEGATIVE (mg/dL)   Protein, ur NEGATIVE  NEGATIVE (mg/dL)   Urobilinogen, UA 0.2  0.0 - 1.0 (mg/dL)   Nitrite NEGATIVE  NEGATIVE    Leukocytes, UA NEGATIVE  NEGATIVE   CBC      Component Value Range   WBC 3.8 (*) 4.0 - 10.5 (K/uL)   RBC 3.67 (*) 3.87 - 5.11 (MIL/uL)   Hemoglobin 12.3  12.0 - 15.0 (g/dL)   HCT 16.1  09.6 - 04.5 (%)   MCV 99.2  78.0 - 100.0 (fL)   MCH 33.5  26.0 -  34.0 (pg)   MCHC 33.8  30.0 - 36.0 (g/dL)   RDW 40.9  81.1 - 91.4 (%)   Platelets 115 (*) 150 - 400 (K/uL)  DIFFERENTIAL      Component Value Range   Neutrophils Relative 52  43 - 77 (%)   Neutro Abs 2.0  1.7 - 7.7 (K/uL)   Lymphocytes Relative  36  12 - 46 (%)   Lymphs Abs 1.4  0.7 - 4.0 (K/uL)   Monocytes Relative 10  3 - 12 (%)   Monocytes Absolute 0.4  0.1 - 1.0 (K/uL)   Eosinophils Relative 1  0 - 5 (%)   Eosinophils Absolute 0.1  0.0 - 0.7 (K/uL)   Basophils Relative 0  0 - 1 (%)   Basophils Absolute 0.0  0.0 - 0.1 (K/uL)  COMPREHENSIVE METABOLIC PANEL      Component Value Range   Sodium 137  135 - 145 (mEq/L)   Potassium 3.6  3.5 - 5.1 (mEq/L)   Chloride 99  96 - 112 (mEq/L)   CO2 29  19 - 32 (mEq/L)   Glucose, Bld 85  70 - 99 (mg/dL)   BUN 16  6 - 23 (mg/dL)   Creatinine, Ser 1.61  0.50 - 1.10 (mg/dL)   Calcium 9.7  8.4 - 09.6 (mg/dL)   Total Protein 8.1  6.0 - 8.3 (g/dL)   Albumin 4.0  3.5 - 5.2 (g/dL)   AST 26  0 - 37 (U/L)   ALT 18  0 - 35 (U/L)   Alkaline Phosphatase 34 (*) 39 - 117 (U/L)   Total Bilirubin 0.4  0.3 - 1.2 (mg/dL)   GFR calc non Af Amer 59 (*) >90 (mL/min)   GFR calc Af Amer 69 (*) >90 (mL/min)  URINE MICROSCOPIC-ADD ON      Component Value Range   Squamous Epithelial / LPF RARE  RARE    WBC, UA 3-6  <3 (WBC/hpf)   RBC / HPF 7-10  <3 (RBC/hpf)   Bacteria, UA RARE  RARE   LIPASE, BLOOD      Component Value Range   Lipase 18  11 - 59 (U/L)   Results for JOSELLE, DEEDS (MRN [id number]) as of 09/22/2011 18:51  Ref. Range 12/27/2010 11:24 09/22/2011 13:26  Platelets Latest Range: 150-400 K/uL 114 (L) 115 (L)    Ct Abdomen Pelvis W Contrast 09/22/2011  *RADIOLOGY REPORT*  Clinical Data: Right-sided abdominal pain.  CT ABDOMEN AND PELVIS WITH CONTRAST  Technique:  Multidetector CT imaging of the abdomen and pelvis was performed following the standard protocol during bolus administration of intravenous contrast.  Contrast: OMNIPAQUE IOHEXOL  300 MG/ML IJ SOLN  Comparison: 09/05/2007  Findings: The patient has a quite redundant stool-filled colon but there is no fecal impaction.  The cecum lies in the mid abdomen just deep to the umbilicus.  Terminal ileum is normal.  No dilated small bowel.  Liver, spleen, pancreas, adrenal glands, and kidneys are normal. Hepatic veins are distended suggesting elevated right heart pressure.  The heart is not enlarged.  No pleural effusions. Slight scarring in the right lower lobe.  Uterus has been removed.  Ovaries are not identified and may been removed as well.  No significant osseous abnormality.  Small hiatal hernia.  Stable partially calcified lymph node just to the left of the aorta at the diaphragmatic hiatus, measuring 15 x 20 mm, unchanged.  This is not felt to be significant.  Gallbladder is not distended.  IMPRESSION:  1.  Benign-appearing abdomen.  Quite redundant stool-filled but not distended colon. 2.  Elevated right heart pressure. 3.  Scarring at the right lung base.  Original Report Authenticated By: Gwynn Burly, M.D.   Dg Abd Acute W/chest 09/22/2011  *RADIOLOGY REPORT*  Clinical Data: Small bowel obstruction  ACUTE ABDOMEN SERIES (ABDOMEN 2 VIEW & CHEST 1 VIEW)  Comparison: In CT 09/05/2007  Findings: Normal cardiac silhouette with ectatic aorta.  There is atelectasis at the right lung base.  No free air beneath hemidiaphragms.  No dilated loops of large or small bowel.  There is stool throughout the colon and rectum.  No pathologic calcifications.  IMPRESSION:  1.  No evidence of bowel obstruction  or  free air. 2.  Right lower lobe atelectasis.  Original Report Authenticated By: Genevive Bi, M.D.     6:50 PM:  Plts per baseline.  Pt has tol PO well while in ED without N/V.  No stooling while in ED.  VS remain stable.  Wants to go home now.  Dx testing d/w pt and family.  Questions answered.  Verb understanding, agreeable to d/c home with outpt f/u.      Laray Anger,  DO 09/23/11 1557

## 2011-09-22 NOTE — ED Notes (Signed)
No change in pt condition. NAD at this time. Pt stable. Will continue to monitor.

## 2011-09-22 NOTE — Telephone Encounter (Signed)
Pt called emergency line with abdominal pain. States past few weeks she has has a nagging pain on the right side of her stomach which occurs after taking her nexium. She has been on nexium for many years. During the day she did not have pain. The past few days however she has had more frequent episodes of pain on the right side, now in the lower quadrant where her hip bone is located and into her back. She denies nausea/vomiting,fever, +constipation takes Miralax. She is concerned the epidsodes are more frequent though not very painful. After speaking with her she was sent to the ER for evaluation which she felt comforted by. She is to hold the nexium

## 2011-09-22 NOTE — ED Notes (Signed)
Pt stable. NAD at this time. Awaiting further orders. Will continue to monitor.

## 2011-09-23 ENCOUNTER — Encounter (HOSPITAL_COMMUNITY): Payer: Self-pay | Admitting: Emergency Medicine

## 2011-09-23 LAB — URINE CULTURE: Culture  Setup Time: 201303162006

## 2011-09-24 ENCOUNTER — Ambulatory Visit: Payer: Medicare Other | Admitting: Internal Medicine

## 2011-09-25 DIAGNOSIS — H04129 Dry eye syndrome of unspecified lacrimal gland: Secondary | ICD-10-CM | POA: Diagnosis not present

## 2011-09-27 ENCOUNTER — Ambulatory Visit (INDEPENDENT_AMBULATORY_CARE_PROVIDER_SITE_OTHER): Payer: Medicare Other

## 2011-09-27 ENCOUNTER — Encounter (HOSPITAL_COMMUNITY): Payer: Self-pay | Admitting: Psychology

## 2011-09-27 VITALS — BP 120/78 | Wt 129.8 lb

## 2011-09-27 DIAGNOSIS — J309 Allergic rhinitis, unspecified: Secondary | ICD-10-CM | POA: Diagnosis not present

## 2011-09-27 NOTE — Progress Notes (Signed)
Patient in to get weekly allergy injection. Received in left arm with no complications

## 2011-09-27 NOTE — Progress Notes (Signed)
Patient:  Kathleen Gallegos   DOB: 13-Jun-1926  MR Number: [id number]  Location: BEHAVIORAL [facility] [address] [place] [address] Dept: [phone]  Start: 1 PM End: 2 PM  Provider/Observer:     Hershal Coria PSYD  Chief Complaint:      Chief Complaint  Patient presents with  . Anxiety  . Other    Somatoform disorder    Reason For Service:     The patient was referred for psychotherapeutic interventions do to increasing worry and anxiety as well as difficulty adjusting to life changes and aging issues.  Interventions Strategy:  Cognitive/behavioral psychotherapy  Participation Level:   Active  Participation Quality:  Appropriate      Behavioral Observation:  Well Groomed, Alert, and Appropriate and Tearful.   Current Psychosocial Factors: The patient is continuing to struggle with the relationship she has with her daughter. The daughter's severe obsessive-compulsive disorder add to the patient stress and makes her anxiety symptoms worse. The patient reports that she really has no option but to continue to have her daughter lived with her but the situation is quite difficult for her.  Content of Session:   Reviewed current symptoms and continue to work on therapeutic interventions with particular focus on her somatizations disorder.  Current Status:   The patient does continue to improve but has been having some more adjustment difficulties recently.  Patient Progress:   Very good  Target Goals:   Target goals include reducing the frequency, intensity, and duration of her hyper focus and hypervigilance on her somatic issues. 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  Last  Reviewed:   08/30/2010  Goals Addressed Today:    Today we were primarily with a somatizations issues.  Impression/Diagnosis:   At this point, the patient does continue to show some significant adjustment difficulties and issues but this does not appear to be anything long-standing. There are no cognitive difficulties or indications of dementia. The patient is in fact relatively healthy. However, there is a continual hyper focus and hypervigilance over her Medical [facility] status and she is continuing to be very concerned about his issues particular her GI functioning.  Diagnosis:    Axis I:  1. Adjustment disorder with anxiety   2. Somatization disorder         Axis II: No diagnosis

## 2011-10-01 DIAGNOSIS — Z124 Encounter for screening for malignant neoplasm of cervix: Secondary | ICD-10-CM | POA: Diagnosis not present

## 2011-10-01 DIAGNOSIS — Z1212 Encounter for screening for malignant neoplasm of rectum: Secondary | ICD-10-CM | POA: Diagnosis not present

## 2011-10-02 ENCOUNTER — Ambulatory Visit (INDEPENDENT_AMBULATORY_CARE_PROVIDER_SITE_OTHER): Payer: Medicare Other | Admitting: Family Medicine

## 2011-10-02 ENCOUNTER — Encounter: Payer: Self-pay | Admitting: Family Medicine

## 2011-10-02 VITALS — BP 114/74 | HR 77 | Resp 16 | Ht 65.0 in | Wt 131.1 lb

## 2011-10-02 DIAGNOSIS — E785 Hyperlipidemia, unspecified: Secondary | ICD-10-CM

## 2011-10-02 DIAGNOSIS — J984 Other disorders of lung: Secondary | ICD-10-CM

## 2011-10-02 DIAGNOSIS — F411 Generalized anxiety disorder: Secondary | ICD-10-CM

## 2011-10-02 DIAGNOSIS — K589 Irritable bowel syndrome without diarrhea: Secondary | ICD-10-CM

## 2011-10-02 DIAGNOSIS — J45909 Unspecified asthma, uncomplicated: Secondary | ICD-10-CM | POA: Diagnosis not present

## 2011-10-02 MED ORDER — ESOMEPRAZOLE MAGNESIUM 40 MG PO CPDR: DELAYED_RELEASE_CAPSULE | ORAL | Status: DC

## 2011-10-02 MED ORDER — BUSPIRONE HCL 5 MG PO TABS: 5.0000 mg | ORAL_TABLET | Freq: Two times a day (BID) | ORAL | Status: DC

## 2011-10-02 MED ORDER — CLORAZEPATE DIPOTASSIUM 7.5 MG PO TABS: ORAL_TABLET | ORAL | Status: DC

## 2011-10-02 NOTE — Assessment & Plan Note (Signed)
Obvious cause of recurrent abdominal pain, pt has had colonoscopy within past 10 years, and her symptoms are unchanged. Will refer for GI follow up. Pt's daughter came to the visit with the understanding that there was a liver lesion which needed to be re asesed, the recent scan in the Ed , makes absolutely no mention of a liver lesion , but states liver appears normal

## 2011-10-02 NOTE — Progress Notes (Signed)
Addended by: [name] MD E on: 10/02/2011 08:42 PM   Modules accepted: Orders

## 2011-10-02 NOTE — Assessment & Plan Note (Signed)
Stable and controlled on current medication, she is also seen by pulmonary. Of note, recent abdominal scan has mention of increase in size of lung nodule,i will make pulmonologist of this as this is a concern of pt and family member

## 2011-10-02 NOTE — Assessment & Plan Note (Signed)
Based on stated concern by family member , and in light of the fact that lipid profile has never been markedly abnormal, a trial of fish oil is appropriate

## 2011-10-02 NOTE — Patient Instructions (Addendum)
F/u in  4.5 month  You will be referred to Dr Leone Payor  Re recurrent abdominal pain  I will make Drr. Young aware of the recent CT scan report on lung nodule  OK to stop pravastatin due to the concern over memory loss potentially, you may take fish oil twice daily in place     Fasting lipid in 4.5 month

## 2011-10-02 NOTE — Progress Notes (Signed)
  Subjective:    Patient ID: Kathleen Gallegos, female    DOB: 1926-03-02, 78 y.o.   MRN: [id number]  HPI Pt in for follow up from recent Ed visit for abdominal pain. Reports chronic intermittent ;ower abdominal pain often in the early morning. Was evaluated in the Ed recently for this. She continues to experience stool in the form of "balls" at times, varies with her diet reportedly.Denies visible blood , black stool, nausea or vomiting. Daughter who is her HPOA has questions about the need for statin use , wants to know "just how bad " her cholesterol is and concern over possible s/e associated with statin use   Review of Systems T.ch Denies recent fever or chills. Denies sinus pressure, nasal congestion, ear pain or sore throat. Denies chest congestion, productive cough or wheezing. Denies chest pains, palpitations and leg swelling  Denies dysuria, frequency, hesitancy or incontinence. Denies disabling  joint pain, swelling and limitation in mobility. Denies headaches, seizures, numbness, or tingling. Denies uncontrolled  depression, anxiety or insomnia.She is maintained on medication for this Denies skin break down or rash.         Objective:   Physical Exam Patient alert and oriented and in no cardiopulmonary distress.  HEENT: No facial asymmetry, EOMI, no sinus tenderness,  oropharynx pink and moist.  Neck adequate ROM no adenopathy.  Chest: Clear to auscultation bilaterally.  CVS: S1, S2 no murmurs, no S3.  ABD: Soft non tender. Bowel sounds normal.  Ext: No edema  MS: Adequate though reduced  ROM spine, shoulders, hips and knees.  Skin: Intact, no ulcerations or rash noted.  Psych: Good eye contact, normal affect. Memory intact not anxious or depressed appearing.  CNS: CN 2-12 intact, power, tone and sensation normal throughout.        Assessment & Plan:

## 2011-10-02 NOTE — Assessment & Plan Note (Addendum)
Abdominal scan done in ED in March 2013, mentions increased size of previously seen lung nodule, has appt in next week with pulmonary, and I will make him aware of this. Review of record, pulmonary ppt is not until May 1, pt had given the impression the appt was earl April, so I will go ahead and schedule a chest CT scan to urther evaluate the nodule seen

## 2011-10-02 NOTE — Assessment & Plan Note (Signed)
Maintained on medication on which she is stable and functional

## 2011-10-08 ENCOUNTER — Other Ambulatory Visit (HOSPITAL_COMMUNITY): Payer: Medicare Other

## 2011-10-09 ENCOUNTER — Other Ambulatory Visit: Payer: Self-pay | Admitting: Family Medicine

## 2011-10-09 ENCOUNTER — Ambulatory Visit (HOSPITAL_COMMUNITY)
Admission: RE | Admit: 2011-10-09 | Discharge: 2011-10-09 | Disposition: A | Discharge status: Home / Self Care | Payer: Medicare Other | Source: Ambulatory Visit | Attending: Family Medicine | Admitting: Family Medicine

## 2011-10-09 DIAGNOSIS — R911 Solitary pulmonary nodule: Secondary | ICD-10-CM | POA: Diagnosis not present

## 2011-10-09 DIAGNOSIS — J984 Other disorders of lung: Secondary | ICD-10-CM

## 2011-10-09 DIAGNOSIS — E049 Nontoxic goiter, unspecified: Secondary | ICD-10-CM | POA: Diagnosis not present

## 2011-10-09 MED ORDER — IOHEXOL 300 MG/ML  SOLN
80.0000 mL | Freq: Once | INTRAMUSCULAR | Status: AC | PRN
  Administered 2011-10-09: 80 mL via INTRAVENOUS

## 2011-10-10 ENCOUNTER — Telehealth: Payer: Self-pay | Admitting: Family Medicine

## 2011-10-10 NOTE — Telephone Encounter (Signed)
Discussed with pt pls schedule

## 2011-10-11 ENCOUNTER — Ambulatory Visit (HOSPITAL_COMMUNITY)
Admission: RE | Admit: 2011-10-11 | Discharge: 2011-10-11 | Disposition: A | Discharge status: Home / Self Care | Payer: Medicare Other | Source: Ambulatory Visit | Attending: Family Medicine | Admitting: Family Medicine

## 2011-10-11 DIAGNOSIS — E042 Nontoxic multinodular goiter: Secondary | ICD-10-CM | POA: Diagnosis not present

## 2011-10-11 DIAGNOSIS — E049 Nontoxic goiter, unspecified: Secondary | ICD-10-CM

## 2011-10-15 ENCOUNTER — Other Ambulatory Visit: Payer: Self-pay | Admitting: Family Medicine

## 2011-10-15 DIAGNOSIS — E079 Disorder of thyroid, unspecified: Secondary | ICD-10-CM

## 2011-10-15 NOTE — Progress Notes (Signed)
Addended by: Abner Greenspan on: 10/15/2011 04:23 PM   Modules accepted: Orders

## 2011-10-16 ENCOUNTER — Ambulatory Visit (INDEPENDENT_AMBULATORY_CARE_PROVIDER_SITE_OTHER): Payer: Medicare Other

## 2011-10-16 ENCOUNTER — Other Ambulatory Visit: Payer: Self-pay | Admitting: Family Medicine

## 2011-10-16 DIAGNOSIS — J309 Allergic rhinitis, unspecified: Secondary | ICD-10-CM | POA: Diagnosis not present

## 2011-10-16 DIAGNOSIS — E079 Disorder of thyroid, unspecified: Secondary | ICD-10-CM | POA: Diagnosis not present

## 2011-10-16 NOTE — Progress Notes (Signed)
Given in right arm with no complications

## 2011-10-17 ENCOUNTER — Ambulatory Visit: Payer: Medicare Other | Admitting: Family Medicine

## 2011-10-17 DIAGNOSIS — D449 Neoplasm of uncertain behavior of unspecified endocrine gland: Secondary | ICD-10-CM | POA: Diagnosis not present

## 2011-10-17 DIAGNOSIS — R220 Localized swelling, mass and lump, head: Secondary | ICD-10-CM | POA: Diagnosis not present

## 2011-10-17 DIAGNOSIS — J310 Chronic rhinitis: Secondary | ICD-10-CM | POA: Diagnosis not present

## 2011-10-17 DIAGNOSIS — R490 Dysphonia: Secondary | ICD-10-CM | POA: Diagnosis not present

## 2011-10-17 DIAGNOSIS — R221 Localized swelling, mass and lump, neck: Secondary | ICD-10-CM | POA: Diagnosis not present

## 2011-10-22 ENCOUNTER — Other Ambulatory Visit (INDEPENDENT_AMBULATORY_CARE_PROVIDER_SITE_OTHER): Payer: Self-pay | Admitting: Otolaryngology

## 2011-10-22 DIAGNOSIS — E041 Nontoxic single thyroid nodule: Secondary | ICD-10-CM

## 2011-10-25 ENCOUNTER — Ambulatory Visit (HOSPITAL_COMMUNITY)
Admission: RE | Admit: 2011-10-25 | Discharge: 2011-10-25 | Disposition: A | Discharge status: Home / Self Care | Payer: Medicare Other | Source: Ambulatory Visit | Attending: Otolaryngology | Admitting: Otolaryngology

## 2011-10-25 ENCOUNTER — Other Ambulatory Visit (INDEPENDENT_AMBULATORY_CARE_PROVIDER_SITE_OTHER): Payer: Self-pay | Admitting: Otolaryngology

## 2011-10-25 VITALS — BP 141/84 | HR 63 | Temp 96.8°F | Resp 16

## 2011-10-25 DIAGNOSIS — E041 Nontoxic single thyroid nodule: Secondary | ICD-10-CM | POA: Diagnosis not present

## 2011-10-25 DIAGNOSIS — E049 Nontoxic goiter, unspecified: Secondary | ICD-10-CM | POA: Insufficient documentation

## 2011-10-25 DIAGNOSIS — E782 Mixed hyperlipidemia: Secondary | ICD-10-CM | POA: Diagnosis not present

## 2011-10-25 NOTE — Discharge Instructions (Signed)
Thyroid Biopsy The thyroid gland is a butterfly-shaped gland situated in the front of the neck. It produces hormones which affect metabolism, growth and development, and body temperature. A thyroid biopsy is a procedure in which small samples of tissue or fluid are removed from the thyroid gland or mass and examined under a microscope. This test is done to determine the cause of thyroid problems, such as infection, cancer, or other thyroid problems. There are 2 ways to obtain samples: 1. Fine needle biopsy. Samples are removed using a thin needle inserted through the skin and into the thyroid gland or mass.  2. Open biopsy. Samples are removed after a cut (incision) is made through the skin.  LET YOUR CAREGIVER KNOW ABOUT:   Allergies.   Medications taken including herbs, eye drops, over-the-counter medications, and creams.   Use of steroids (by mouth or creams).   Previous problems with anesthetics or numbing medicine.   Possibility of pregnancy, if this applies.   History of blood clots (thrombophlebitis).   History of bleeding or blood problems.   Previous surgery.   Other health problems.  RISKS AND COMPLICATIONS  Bleeding from the site. The risk of bleeding is higher if you have a bleeding disorder or are taking any blood thinning medications (anticoagulants).   Infection.   Injury to structures near the thyroid gland.  BEFORE THE PROCEDURE  This is a procedure that can be done as an outpatient. Confirm the time that you need to arrive for your procedure. Confirm whether there is a need to fast or withhold any medications. A blood sample may be done to determine your blood clotting time. Medicine may be given to help you relax (sedative). PROCEDURE Fine needle biopsy. You will be awake during the procedure. You may be asked to lie on your back with your head tipped backward to extend your neck. Let your caregiver know if you cannot tolerate the positioning. An area on your  neck will be cleansed. A needle is inserted through the skin of your neck. You may feel a mild discomfort during this procedure. You may be asked to avoid coughing, talking, swallowing, or making sounds during some portions of the procedure. The needle is withdrawn once tissue or fluid samples have been removed. Pressure may be applied to the neck to reduce swelling and ensure that bleeding has stopped. The samples will be sent for examination.  Open biopsy. You will be given general anesthesia. You will be asleep during the procedure. An incision is made in your neck. A sample of thyroid tissue or the mass is removed. The tissue sample or mass will be sent for examination. The sample or mass may be examined during the biopsy. If the sample or mass contains cancer cells, some or all of the thyroid gland may be removed. The incision is closed with stitches. AFTER THE PROCEDURE  Your recovery will be assessed and monitored. If there are no problems, as an outpatient, you should be able to go home shortly after the procedure. If you had a fine needle biopsy:  You may have soreness at the biopsy site for 1 to 2 days.  If you had an open biopsy:   You may have soreness at the biopsy site for 3 to 4 days.   You may have a hoarse voice or sore throat for 1 to 2 days.  Obtaining the Test Results It is your responsibility to obtain your test results. Do not assume everything is normal if you have   not heard from your caregiver or the medical facility. It is important for you to follow up on all of your test results. HOME CARE INSTRUCTIONS   Keeping your head raised on a pillow when you are lying down may ease biopsy site discomfort.   Supporting the back of your head and neck with both hands as you sit up from a lying position may ease biopsy site discomfort.   Only take over-the-counter or prescription medicines for pain, discomfort, or fever as directed by your caregiver.   Throat lozenges or gargling  with warm salt water may help to soothe a sore throat.  SEEK IMMEDIATE MEDICAL CARE IF:   You have severe bleeding from the biopsy site.   You have difficulty swallowing.   You have a fever.   You have increased pain, swelling, redness, or warmth at the biopsy site.   You notice pus coming from the biopsy site.   You have swollen glands (lymph nodes) in your neck.  Document Released: 04/22/2007 Document Revised: 06/14/2011 Document Reviewed: 09/22/2008 Exit[employer] Patient Information 2012 [employer]. 

## 2011-10-25 NOTE — Procedures (Signed)
PreOperative Dx: RIGHT thyroid nodule Postoperative Dx: RIGHT thyroid nodule Procedure:   US guided FNA of RIGHT thyroid nodule Radiologist:  Tyron Russell Anesthesia:  1 ml of 2% lidocaine Specimen:  FNA x three EBL:   None Complications: None

## 2011-10-25 NOTE — Progress Notes (Signed)
Lidocaine 2%        1mL injected 

## 2011-10-26 ENCOUNTER — Ambulatory Visit (INDEPENDENT_AMBULATORY_CARE_PROVIDER_SITE_OTHER): Payer: Medicare Other | Admitting: Internal Medicine

## 2011-10-26 ENCOUNTER — Encounter: Payer: Self-pay | Admitting: Internal Medicine

## 2011-10-26 VITALS — BP 100/58 | HR 80 | Ht 66.0 in | Wt 125.0 lb

## 2011-10-26 DIAGNOSIS — K589 Irritable bowel syndrome without diarrhea: Secondary | ICD-10-CM

## 2011-10-26 DIAGNOSIS — R932 Abnormal findings on diagnostic imaging of liver and biliary tract: Secondary | ICD-10-CM

## 2011-10-26 NOTE — Progress Notes (Signed)
Patient ID: Kathleen Gallegos, female   DOB: August 28, 1925, 78 y.o.   MRN: [id number]   The patient is here with her daughter, her healthcare power of attorney. The main concern is that a number of years ago there were some spots on the liver. It turns out in 2000 HE had 2 tiny lesions in the liver seen on ultrasound and CT have a followup MRI in 2009 that showed absolutely no change. It was thought that these were benign lesions and not clinically significant. The patient now has a lung nodule and a thyroid nodule for which she underwent biopsy this week. There are concerns about possible relations with the liver lesions and a question of whether or not the liver needs to be followed on an annual basis. The patient and her daughter were not necessarily aware that a CT scan was performed in the last few weeks during an ER visit for abdominal pain, the liver is reported to be normal on the CT abdomen and pelvis with contrast.  The patient has chronic intermittent constipation and palate-like stools with known IBS, this is an unchanged pattern. In general if she uses MiraLax that will help.  Medications, allergies, past medical history, past surgical history, family history and social history are reviewed and updated in the EMR.   Assessment and plan:  1. Nonspecific (abnormal) findings on radiological and other examination of biliary tract   Not clinically significant, up no specific GI followup as needed.   2. IBS (irritable bowel syndrome)   Stable, encouraged to eat a high fiber diet and continue MiraLax. Return to GI as needed.    WU:[id number] Lodema Hong, MD

## 2011-10-31 ENCOUNTER — Ambulatory Visit (HOSPITAL_COMMUNITY): Payer: Medicare Other | Admitting: Psychology

## 2011-10-31 ENCOUNTER — Ambulatory Visit (INDEPENDENT_AMBULATORY_CARE_PROVIDER_SITE_OTHER): Payer: Medicare Other

## 2011-10-31 VITALS — BP 128/76 | Wt 128.1 lb

## 2011-10-31 DIAGNOSIS — J309 Allergic rhinitis, unspecified: Secondary | ICD-10-CM | POA: Diagnosis not present

## 2011-10-31 NOTE — Progress Notes (Signed)
Pt in for allergy injection.  Given in left arm.  No signs or symptoms of adverse reaction.  Pt informed to return next week for next injection.

## 2011-11-07 ENCOUNTER — Encounter: Payer: Self-pay | Admitting: Internal Medicine

## 2011-11-07 ENCOUNTER — Ambulatory Visit (INDEPENDENT_AMBULATORY_CARE_PROVIDER_SITE_OTHER): Payer: Medicare Other | Admitting: Internal Medicine

## 2011-11-07 VITALS — BP 108/60 | HR 63 | Ht 65.0 in | Wt 130.2 lb

## 2011-11-07 DIAGNOSIS — J301 Allergic rhinitis due to pollen: Secondary | ICD-10-CM | POA: Diagnosis not present

## 2011-11-07 DIAGNOSIS — J449 Chronic obstructive pulmonary disease, unspecified: Secondary | ICD-10-CM | POA: Diagnosis not present

## 2011-11-07 DIAGNOSIS — R911 Solitary pulmonary nodule: Secondary | ICD-10-CM

## 2011-11-07 DIAGNOSIS — J45909 Unspecified asthma, uncomplicated: Secondary | ICD-10-CM

## 2011-11-07 DIAGNOSIS — J45998 Other asthma: Secondary | ICD-10-CM

## 2011-11-07 DIAGNOSIS — J984 Other disorders of lung: Secondary | ICD-10-CM

## 2011-11-07 NOTE — Patient Instructions (Addendum)
Order- Contrast CT Chest, with BMET, to be done in 3  Months   For dx Lung nodule  Order- schedule 6 MWT    Dx COPD  sample Nasonex nasal steroid spray    1 or 2 puffs in each nostril every night at bedtime

## 2011-11-07 NOTE — Progress Notes (Signed)
Patient ID: Kathleen Gallegos, female    DOB: 01-23-1926, 78 y.o.   MRN: [id number]  HPI 03/27/2011-78 year old female never smoker followed for asthma, allergic rhinitis Last here 07/24/2010-note reviewed She notices persistent left nostril stuffiness in the mornings with watery rhinorrhea but no blood or headache. This has not gotten worse in the fall weather. She continues allergy shots, believes they help her. Denies wheeze cough or shortness of breath and has not been using her rescue inhaler.  06/08/12--78 year old female never smoker followed for asthma, allergic rhinitis No significant respiratory infections this winter. Her primary physician gave Cipro for urinary infection ending yesterday. This also improved her sinus drainage which is noted mostly when she is lying down. She denies cough and says her chest feels clear.  11/07/11- 78 year old female never smoker followed for asthma, allergic rhinitis   PCP Dr Lodema Hong Family member here Scratchy throat with increased pollen; nasal drainage every morning-white in color. Nasal congestion. CT chest 10/09/2011 was reviewed with them in detail:. Dr. Lodema Hong had been good enough to send a note directing attention to her left lower lobe nodule. A thyroid nodule was also seen and biopsied "benign". IMPRESSION:  1. Left lower lobe nodule measures slightly larger than the  examination from 2008. Hounsfield unit measurements from this  nodule are similar to the thoracic aorta and nearby pulmonary  artery and veins. The differential diagnosis for this nodule  includes pulmonary AVM, slow-growing pulmonary neoplasm, or  impacted distal bronchial containing inspissated mucous.  Management strategies include follow-up imaging  to document stability. A contrast-enhanced CT of the chest and 3  months would be recommended in this case. Alternatively,  endobronchial ultrasound may be helpful for further evaluation.  This nodule may be too small to  biopsy percutaneously and is below  the size threshold for PET CT.  2. The other small nodules are stable when compared with previous  imaging.  3. Increase in size of right lobe of thyroid gland nodule.  Suggest further evaluation with thyroid sonography.  Original Report Authenticated By: Rosealee Albee, M.D.   Review of Systems-see HPI   Constitutional:   No-   weight loss, night sweats, fevers, chills, fatigue, lassitude. HEENT:   No-  headaches, difficulty swallowing, tooth/dental problems, sore throat,       No-  sneezing, itching, ear ache,  +nasal congestion, post nasal drip,  CV:  No-   chest pain, orthopnea, PND, swelling in lower extremities, anasarca, dizziness, palpitations Resp: + shortness of breath with exertion or at rest.              No-   productive cough,  No non-productive cough,  No-  coughing up of blood.              No-   change in color of mucus.  No- wheezing.   Skin: No-   rash or lesions. GI:  No-   heartburn, indigestion, abdominal pain, nausea, vomiting,  GU: MS:  No-   joint pain or swelling.   Neuro-  Psych:  No- change in mood or affect. No depression or anxiety.  No memory loss.  OBJ- General- Alert, Oriented, Affect-appropriate, Distress- none acute, comfortable-appearing elderly woman, talkative Skin- rash-none, lesions- none, excoriation- none Lymphadenopathy- none Head- atraumatic            Eyes- Gross vision intact, PERRLA, conjunctivae clear secretions            Ears- Hearing, canals normal for age  Nose- Clear, no-Septal dev, mucus, polyps, erosion, perforation             Throat- Mallampati II , mucosa clear , drainage- none, tonsils- atrophic, missing teeth Neck- flexible , trachea midline, no stridor , thyroid nl, carotid no bruit Chest - symmetrical excursion , unlabored           Heart/CV- RRR , no murmur , no gallop  , no rub, nl s1 s2                           - JVD- none , edema- none, stasis changes- none, varices-  none           Lung-  Distant but clear to P&A, wheeze- none, cough- none , dullness-none, rub- none           Chest wall-  Abd-  Br/ Gen/ Rectal- Not done, not indicated Extrem- cyanosis- none, clubbing, none, atrophy- none, strength- nl Neuro- grossly intact to observation

## 2011-11-09 ENCOUNTER — Ambulatory Visit (INDEPENDENT_AMBULATORY_CARE_PROVIDER_SITE_OTHER): Payer: Medicare Other | Admitting: Psychology

## 2011-11-09 DIAGNOSIS — F450 Somatization disorder: Secondary | ICD-10-CM | POA: Diagnosis not present

## 2011-11-09 DIAGNOSIS — F4322 Adjustment disorder with anxiety: Secondary | ICD-10-CM

## 2011-11-10 NOTE — Assessment & Plan Note (Signed)
Mild seasonal exacerbation to repeat pollen season but this should clear quickly. Plan-sample Nasonex

## 2011-11-10 NOTE — Assessment & Plan Note (Signed)
Nodule currently too small for needle biopsy or PET scan. We will continue to follow. I reviewed the images with her and her daughter.

## 2011-11-10 NOTE — Assessment & Plan Note (Signed)
Asthma component is well controlled with mild intermittent symptoms.

## 2011-11-14 ENCOUNTER — Ambulatory Visit (INDEPENDENT_AMBULATORY_CARE_PROVIDER_SITE_OTHER): Payer: Medicare Other

## 2011-11-14 DIAGNOSIS — J309 Allergic rhinitis, unspecified: Secondary | ICD-10-CM | POA: Diagnosis not present

## 2011-11-14 NOTE — Progress Notes (Signed)
Pt in for weekly allergy injection.  Injection given in right arm.  No voiced complaints. No sign or symptom of adverse reaction.

## 2011-11-20 ENCOUNTER — Ambulatory Visit (INDEPENDENT_AMBULATORY_CARE_PROVIDER_SITE_OTHER): Payer: Medicare Other

## 2011-11-20 DIAGNOSIS — J309 Allergic rhinitis, unspecified: Secondary | ICD-10-CM

## 2011-11-20 NOTE — Progress Notes (Signed)
Pt received injection in left arm with no complications  

## 2011-11-22 ENCOUNTER — Ambulatory Visit: Payer: Self-pay

## 2011-11-22 DIAGNOSIS — E785 Hyperlipidemia, unspecified: Secondary | ICD-10-CM | POA: Diagnosis not present

## 2011-11-22 DIAGNOSIS — M899 Disorder of bone, unspecified: Secondary | ICD-10-CM | POA: Diagnosis not present

## 2011-11-22 DIAGNOSIS — M949 Disorder of cartilage, unspecified: Secondary | ICD-10-CM | POA: Diagnosis not present

## 2011-11-22 DIAGNOSIS — R5381 Other malaise: Secondary | ICD-10-CM | POA: Diagnosis not present

## 2011-11-22 LAB — LIPID PANEL
Cholesterol: 241 mg/dL — ABNORMAL HIGH (ref 0–200)
HDL: 85 mg/dL (ref >39)
LDL Cholesterol: 148 mg/dL — ABNORMAL HIGH (ref 0–99)
Triglycerides: 42 mg/dL (ref <150)

## 2011-11-22 LAB — BASIC METABOLIC PANEL
BUN: 16 mg/dL (ref 6–23)
CO2: 31 mEq/L (ref 19–32)
Calcium: 9.6 mg/dL (ref 8.4–10.5)
Chloride: 105 mEq/L (ref 96–112)
Creat: 0.88 mg/dL (ref 0.50–1.10)
Glucose, Bld: 84 mg/dL (ref 70–99)

## 2011-11-22 LAB — HEPATIC FUNCTION PANEL
ALT: 20 U/L (ref 0–35)
Albumin: 4.1 g/dL (ref 3.5–5.2)
Indirect Bilirubin: 0.3 mg/dL (ref 0.0–0.9)
Total Protein: 7.3 g/dL (ref 6.0–8.3)

## 2011-11-23 LAB — CBC WITH DIFFERENTIAL/PLATELET
Basophils Absolute: 0 10*3/uL (ref 0.0–0.1)
Eosinophils Absolute: 0.1 10*3/uL (ref 0.0–0.7)
Eosinophils Relative: 2 % (ref 0–5)
Lymphocytes Relative: 54 % — ABNORMAL HIGH (ref 12–46)
MCV: 98.8 fL (ref 78.0–100.0)
Neutrophils Relative %: 34 % — ABNORMAL LOW (ref 43–77)
Platelets: 116 10*3/uL — ABNORMAL LOW (ref 150–400)
RDW: 14.2 % (ref 11.5–15.5)
WBC: 2.9 10*3/uL — ABNORMAL LOW (ref 4.0–10.5)

## 2011-11-23 LAB — VITAMIN D 25 HYDROXY (VIT D DEFICIENCY, FRACTURES): Vit D, 25-Hydroxy: 40 ng/mL (ref 30–89)

## 2011-11-26 ENCOUNTER — Ambulatory Visit: Payer: Medicare Other | Admitting: Family Medicine

## 2011-11-27 NOTE — Progress Notes (Signed)
Received shot with no complications 

## 2011-12-04 ENCOUNTER — Ambulatory Visit: Payer: Medicare Other | Admitting: Family Medicine

## 2011-12-05 ENCOUNTER — Ambulatory Visit: Payer: Self-pay | Admitting: Family Medicine

## 2011-12-05 ENCOUNTER — Ambulatory Visit (INDEPENDENT_AMBULATORY_CARE_PROVIDER_SITE_OTHER): Payer: Medicare Other

## 2011-12-05 VITALS — BP 122/80 | Wt 127.8 lb

## 2011-12-05 DIAGNOSIS — J301 Allergic rhinitis due to pollen: Secondary | ICD-10-CM | POA: Diagnosis not present

## 2011-12-13 ENCOUNTER — Ambulatory Visit (INDEPENDENT_AMBULATORY_CARE_PROVIDER_SITE_OTHER): Payer: Medicare Other

## 2011-12-13 VITALS — BP 130/74 | Wt 128.0 lb

## 2011-12-13 DIAGNOSIS — J309 Allergic rhinitis, unspecified: Secondary | ICD-10-CM | POA: Diagnosis not present

## 2011-12-13 NOTE — Progress Notes (Signed)
Pt in for weekly allergy injection.  Given in left arm with no signs or symptoms of adverse reaction.  No voiced complaints.

## 2011-12-13 NOTE — Progress Notes (Signed)
Pt supplies medication

## 2011-12-14 DIAGNOSIS — J310 Chronic rhinitis: Secondary | ICD-10-CM | POA: Diagnosis not present

## 2011-12-14 DIAGNOSIS — D449 Neoplasm of uncertain behavior of unspecified endocrine gland: Secondary | ICD-10-CM | POA: Diagnosis not present

## 2011-12-27 ENCOUNTER — Encounter (HOSPITAL_COMMUNITY): Payer: Self-pay | Admitting: Psychology

## 2011-12-27 ENCOUNTER — Telehealth: Payer: Self-pay | Admitting: Family Medicine

## 2011-12-27 ENCOUNTER — Ambulatory Visit (INDEPENDENT_AMBULATORY_CARE_PROVIDER_SITE_OTHER): Payer: Medicare Other | Admitting: Family Medicine

## 2011-12-27 ENCOUNTER — Encounter: Payer: Self-pay | Admitting: Family Medicine

## 2011-12-27 VITALS — BP 120/74 | HR 68 | Resp 16 | Ht 66.0 in | Wt 127.8 lb

## 2011-12-27 DIAGNOSIS — E785 Hyperlipidemia, unspecified: Secondary | ICD-10-CM | POA: Diagnosis not present

## 2011-12-27 DIAGNOSIS — K219 Gastro-esophageal reflux disease without esophagitis: Secondary | ICD-10-CM | POA: Diagnosis not present

## 2011-12-27 DIAGNOSIS — D61818 Other pancytopenia: Secondary | ICD-10-CM | POA: Diagnosis not present

## 2011-12-27 DIAGNOSIS — R0989 Other specified symptoms and signs involving the circulatory and respiratory systems: Secondary | ICD-10-CM | POA: Insufficient documentation

## 2011-12-27 DIAGNOSIS — J45998 Other asthma: Secondary | ICD-10-CM

## 2011-12-27 DIAGNOSIS — F411 Generalized anxiety disorder: Secondary | ICD-10-CM

## 2011-12-27 DIAGNOSIS — J309 Allergic rhinitis, unspecified: Secondary | ICD-10-CM | POA: Diagnosis not present

## 2011-12-27 DIAGNOSIS — Z298 Encounter for other specified prophylactic measures: Secondary | ICD-10-CM

## 2011-12-27 DIAGNOSIS — J45909 Unspecified asthma, uncomplicated: Secondary | ICD-10-CM

## 2011-12-27 MED ORDER — PRAVASTATIN SODIUM 20 MG PO TABS: 20.0000 mg | ORAL_TABLET | Freq: Every evening | ORAL | Status: DC

## 2011-12-27 NOTE — Patient Instructions (Addendum)
F/u in 4 month/early October.  Please call if you need me before.  Happy belated birthday!!!    Fasting lipid, hepatic in 4 months 1 week before visit    You are to start pravastatin 20mg  one tablet  Monday, Wednesday and Friday only, your cholesterol has increased.   You are referred for ultrasound of the arteries in your neck and also to the hematologist about low blood counts

## 2011-12-27 NOTE — Progress Notes (Signed)
Patient:  Kathleen Gallegos   DOB: 1925/12/09  MR Number: [id number]  Location: BEHAVIORAL [facility] [address] [place] [address] Dept: [phone]  Start: 4 PM End: 5 PM  Provider/Observer:     Hershal Coria PSYD  Chief Complaint:      Chief Complaint  Patient presents with  . Anxiety  . Stress    Reason For Service:     The patient was referred for psychotherapeutic interventions do to increasing worry and anxiety as well as difficulty adjusting to life changes and aging issues.  Interventions Strategy:  Cognitive/behavioral psychotherapy  Participation Level:   Active  Participation Quality:  Appropriate      Behavioral Observation:  Well Groomed, Alert, and Appropriate and Tearful.   Current Psychosocial Factors: The patient reports that she's been actively working on some of the coping skills that we have talked about what she can do to help reduce the stressors associated with her daughter. She reports that these are helping a great deal and that she has been significantly improving her overall status.  Content of Session:   Reviewed current symptoms and continue to work on therapeutic interventions with particular focus on her somatizations disorder.  Current Status:   The patient reports that she is adjusting much better and that as she adjusts to the stressors in her household that her physical worries and complaints of been improving.  Patient Progress:   Very good  Target Goals:   Target goals include reducing the frequency, intensity, and duration of her hyper focus and hypervigilance on her somatic issues. 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  Last  Reviewed:   11/09/2010  Goals Addressed Today:    Today we were primarily with a somatizations issues.  Impression/Diagnosis:   At this point, the patient does continue to show some significant adjustment difficulties and issues but this does not appear to be anything long-standing. There are no cognitive difficulties or indications of dementia. The patient is in fact relatively healthy. However, there is a continual hyper focus and hypervigilance over her [facility] status and she is continuing to be very concerned about his issues particular her GI functioning.  Diagnosis:    Axis I:  1. Adjustment disorder with anxiety   2. Somatization disorder         Axis II: No diagnosis

## 2011-12-27 NOTE — Telephone Encounter (Signed)
Left message on Kathleen Gallegos cell # about her mothers appointment

## 2011-12-28 ENCOUNTER — Telehealth (HOSPITAL_COMMUNITY): Payer: Self-pay | Admitting: Oncology

## 2011-12-28 ENCOUNTER — Ambulatory Visit (HOSPITAL_COMMUNITY)
Admission: RE | Admit: 2011-12-28 | Discharge: 2011-12-28 | Disposition: A | Discharge status: Home / Self Care | Payer: Medicare Other | Source: Ambulatory Visit | Attending: Family Medicine | Admitting: Family Medicine

## 2011-12-28 DIAGNOSIS — R0989 Other specified symptoms and signs involving the circulatory and respiratory systems: Secondary | ICD-10-CM | POA: Diagnosis not present

## 2012-01-02 ENCOUNTER — Encounter (HOSPITAL_COMMUNITY): Payer: Medicare Other | Admitting: Oncology

## 2012-01-02 ENCOUNTER — Ambulatory Visit (HOSPITAL_COMMUNITY): Payer: Medicare Other | Admitting: Oncology

## 2012-01-04 ENCOUNTER — Encounter (HOSPITAL_COMMUNITY): Payer: Medicare Other | Attending: Oncology | Admitting: Oncology

## 2012-01-04 VITALS — BP 144/73 | HR 68 | Temp 98.5°F | Ht 65.0 in | Wt 126.8 lb

## 2012-01-04 DIAGNOSIS — E7800 Pure hypercholesterolemia, unspecified: Secondary | ICD-10-CM | POA: Insufficient documentation

## 2012-01-04 DIAGNOSIS — K5900 Constipation, unspecified: Secondary | ICD-10-CM | POA: Insufficient documentation

## 2012-01-04 DIAGNOSIS — K5909 Other constipation: Secondary | ICD-10-CM | POA: Diagnosis not present

## 2012-01-04 DIAGNOSIS — J45909 Unspecified asthma, uncomplicated: Secondary | ICD-10-CM | POA: Diagnosis not present

## 2012-01-04 DIAGNOSIS — R413 Other amnesia: Secondary | ICD-10-CM | POA: Insufficient documentation

## 2012-01-04 DIAGNOSIS — D61818 Other pancytopenia: Secondary | ICD-10-CM | POA: Diagnosis not present

## 2012-01-04 LAB — DIFFERENTIAL
Basophils Absolute: 0 10*3/uL (ref 0.0–0.1)
Eosinophils Relative: 1 % (ref 0–5)
Lymphocytes Relative: 53 % — ABNORMAL HIGH (ref 12–46)
Lymphs Abs: 1.5 10*3/uL (ref 0.7–4.0)
Monocytes Absolute: 0.3 10*3/uL (ref 0.1–1.0)
Monocytes Relative: 9 % (ref 3–12)
Neutro Abs: 1 10*3/uL — ABNORMAL LOW (ref 1.7–7.7)

## 2012-01-04 LAB — CBC
HCT: 35.2 % — ABNORMAL LOW (ref 36.0–46.0)
Hemoglobin: 12 g/dL (ref 12.0–15.0)
MCV: 99.7 fL (ref 78.0–100.0)
RBC: 3.53 MIL/uL — ABNORMAL LOW (ref 3.87–5.11)
RDW: 12.6 % (ref 11.5–15.5)
WBC: 2.8 10*3/uL — ABNORMAL LOW (ref 4.0–10.5)

## 2012-01-04 NOTE — Progress Notes (Signed)
Problem #1 intermittent pancytopenia since at least 2008  Problem #2 mild memory loss  Problem #3 hypercholesterolemia  Problem #4 history of asthma  Problem number #5 chronic constipation  This is a very pleasant 78 year old lady referred to the courtesy of Dr. Lodema Hong for evaluation of pancytopenia which goes back to at least 2008. Realistically her blood counts are very stable since then. She does not have frequent infections no increase in bleeding no weight loss and she is aware of nor her daughter Arline Asp who is with her today. She has no night sweats fevers or chills but sounds like she has nocturnal asthma issues. She has no blood in her stool blood in her urine she states. She is not having bone pain. She was one of 11 children all but her and her brother are deceased. Her parents are deceased mother from post childbirth complications. Her follow lived to the an elderly individual.  She is not a smoker not a drinker. She has no trouble swallowing or foods. She does have abdominal bulge. Her physical exam shows an elderly lady looks her stated age no acute distress 5 feet 5 inches tall weights 126 pounds. Her BMI is 21 she is afebrile blood pressure 144/73 pulse 68 and regular respirations 1618 and unlabored at rest and she denies any pain presently. She has no lymphadenopathy. She specifically has no hepatosplenomegaly. Her lungs are clear at this time. There are no wheezes rhonchi or rales. Her heart shows a regular rhythm and rate without murmur rub or gallop. Her abdomen in the right lateral decubitus position also reveals no splenomegaly. She has a large abdominal ventral hernia. She has normal to decrease bowel sounds. She has no leg edema pulses are dorsalis pedis 2+ posterior tibialis pulses 1+ on the left and trace on the right ear she is alert oriented but does have some memory issues.  She is not symptomatic from this mild intermittent pancytopenia and therefore since it is very stable  I do not think we should perform a bone marrow aspirate and biopsy to evaluate further at this time. She would not be a good candidate for chemotherapy and since the 5Q minus syndrome is very rare and she is not progressing I think she should just be observed at this time. We will see her back in 6 months

## 2012-01-04 NOTE — Patient Instructions (Addendum)
[facility]  Discharge Instructions Kathleen Gallegos  [id number] 08-04-1925 Dr. Glenford Peers  RECOMMENDATIONS MADE BY THE CONSULTANT AND ANY TEST RESULTS WILL BE SENT TO YOUR REFERRING DOCTOR.   EXAM FINDINGS BY MD TODAY AND SIGNS AND SYMPTOMS TO REPORT TO CLINIC OR PRIMARY MD:  We will check some labs today and   MEDICATIONS PRESCRIBED: Magnesium 8 meq(100mg )  once or twice a day is ok. Multivitamin daily is ok coq10 is ok   INSTRUCTIONS GIVEN AND DISCUSSED: We will let you know if you need vitamin b12  SPECIAL INSTRUCTIONS/FOLLOW-UP: 6 months   I acknowledge that I have been informed and understand all the instructions given to me and received a copy. I do not have any more questions at this time, but understand that I may call the Specialty Clinic at [facility] at [phone] during business hours should I have any further questions or need assistance in obtaining follow-up care.    __________________________________________  _____________  __________ Signature of Patient or Authorized Representative            Date                   Time    __________________________________________ Nurse's Signature

## 2012-01-05 LAB — IRON AND TIBC
Iron: 75 ug/dL (ref 42–135)
TIBC: 288 ug/dL (ref 250–470)
UIBC: 213 ug/dL (ref 125–400)

## 2012-01-05 LAB — FERRITIN: Ferritin: 128 ng/mL (ref 10–291)

## 2012-01-07 ENCOUNTER — Other Ambulatory Visit: Payer: Self-pay | Admitting: Family Medicine

## 2012-01-07 NOTE — Assessment & Plan Note (Signed)
Stable onn medication per pulmonary, no recent flares

## 2012-01-07 NOTE — Assessment & Plan Note (Signed)
Controlled, no change in medication  

## 2012-01-07 NOTE — Assessment & Plan Note (Signed)
Carotid doppler to further eval

## 2012-01-07 NOTE — Assessment & Plan Note (Signed)
Stable on current med

## 2012-01-07 NOTE — Progress Notes (Signed)
  Subjective:    Patient ID: Kathleen Gallegos, female    DOB: 1925-10-14, 78 y.o.   MRN: [id number]  HPI The PT is here for follow up and re-evaluation of chronic medical conditions, medication management and review of any available recent lab and radiology data.  Preventive health is updated, specifically  Cancer screening and Immunization.   Questions or concerns regarding consultations or procedures which the PT has had in the interim are  Addressed.She has seen the pulmonary specialist The PT denies any adverse reactions to current medications since the last visit.  C/o pounding in the ears when she lies down often, concerned about blockage in her neck arteries      Review of Systems See HPI Denies recent fever or chills. Denies sinus pressure, nasal congestion, ear pain or sore throat. Denies chest congestion, productive cough or wheezing. Denies chest pains, palpitations and leg swelling Denies abdominal pain, nausea, vomiting,diarrhea or constipation.   Denies dysuria, frequency, hesitancy or incontinence. Chronic  joint pain,  and limitation in mobility. Denies headaches, seizures, numbness, or tingling. Denies uncontrolled  depression, anxiety or insomnia. Denies skin break down or rash.        Objective:   Physical Exam Patient alert and oriented and in no cardiopulmonary distress.  HEENT: No facial asymmetry, EOMI, no sinus tenderness,  oropharynx pink and moist.  Neck decreased ROM no adenopathy.Bruit  Chest: Clear to auscultation bilaterally.  CVS: S1, S2 no murmurs, no S3.  ABD: Soft non tender. Bowel sounds normal.  Ext: No edema  MS: Adequate though reduced ROM spine, shoulders, hips and knees.  Skin: Intact, no ulcerations or rash noted.  Psych: Good eye contact, normal affect. Memory mildly impaired not anxious or depressed appearing.  CNS: CN 2-12 intact, power, tone and sensation normal throughout.        Assessment & Plan:

## 2012-01-07 NOTE — Assessment & Plan Note (Signed)
Pt being referred to oncology for eval of same

## 2012-01-07 NOTE — Assessment & Plan Note (Signed)
Deteriorated, pt to resume statin, I also discussed this after the visit with her daughter

## 2012-01-07 NOTE — Assessment & Plan Note (Signed)
Weekly allergy shot administered at the visit

## 2012-01-09 ENCOUNTER — Ambulatory Visit: Payer: Medicare Other

## 2012-01-15 ENCOUNTER — Telehealth: Payer: Self-pay | Admitting: Internal Medicine

## 2012-01-15 NOTE — Telephone Encounter (Signed)
Error.  Kathleen Gallegos ° °

## 2012-01-17 ENCOUNTER — Ambulatory Visit (INDEPENDENT_AMBULATORY_CARE_PROVIDER_SITE_OTHER): Payer: Medicare Other

## 2012-01-17 VITALS — BP 130/68 | Wt 126.1 lb

## 2012-01-17 DIAGNOSIS — J309 Allergic rhinitis, unspecified: Secondary | ICD-10-CM

## 2012-01-17 NOTE — Progress Notes (Signed)
Pt in for weekly allergy injection.  Injection given in left arm.  No sign or symptom of adverse reaction.  No voiced complaints.

## 2012-01-24 ENCOUNTER — Ambulatory Visit (INDEPENDENT_AMBULATORY_CARE_PROVIDER_SITE_OTHER): Payer: Medicare Other

## 2012-01-24 VITALS — BP 130/72 | Wt 128.0 lb

## 2012-01-24 DIAGNOSIS — J309 Allergic rhinitis, unspecified: Secondary | ICD-10-CM | POA: Diagnosis not present

## 2012-01-24 NOTE — Progress Notes (Signed)
Pt in for weekly allergy injection.  Injection given in right arm.  No sign or symptom of adverse reaction.  No voiced complaints.

## 2012-01-30 ENCOUNTER — Ambulatory Visit (INDEPENDENT_AMBULATORY_CARE_PROVIDER_SITE_OTHER): Payer: Medicare Other

## 2012-01-30 VITALS — BP 120/72 | Wt 125.8 lb

## 2012-01-30 DIAGNOSIS — J301 Allergic rhinitis due to pollen: Secondary | ICD-10-CM

## 2012-01-31 NOTE — Progress Notes (Signed)
Patient received injections with no complications

## 2012-02-06 ENCOUNTER — Ambulatory Visit (INDEPENDENT_AMBULATORY_CARE_PROVIDER_SITE_OTHER): Payer: Medicare Other

## 2012-02-06 VITALS — BP 110/74 | Wt 127.4 lb

## 2012-02-06 DIAGNOSIS — J301 Allergic rhinitis due to pollen: Secondary | ICD-10-CM | POA: Diagnosis not present

## 2012-02-06 NOTE — Progress Notes (Signed)
Patient received injection with no complications in right arm

## 2012-02-08 ENCOUNTER — Ambulatory Visit (INDEPENDENT_AMBULATORY_CARE_PROVIDER_SITE_OTHER): Payer: Medicare Other | Admitting: Psychology

## 2012-02-08 DIAGNOSIS — F4322 Adjustment disorder with anxiety: Secondary | ICD-10-CM

## 2012-02-08 DIAGNOSIS — F450 Somatization disorder: Secondary | ICD-10-CM | POA: Diagnosis not present

## 2012-02-11 ENCOUNTER — Other Ambulatory Visit: Payer: Self-pay | Admitting: Internal Medicine

## 2012-02-11 DIAGNOSIS — J984 Other disorders of lung: Secondary | ICD-10-CM

## 2012-02-12 ENCOUNTER — Telehealth: Payer: Self-pay | Admitting: Family Medicine

## 2012-02-12 ENCOUNTER — Other Ambulatory Visit: Payer: Self-pay

## 2012-02-12 MED ORDER — CLORAZEPATE DIPOTASSIUM 7.5 MG PO TABS: ORAL_TABLET | ORAL | Status: DC

## 2012-02-12 NOTE — Telephone Encounter (Signed)
Ho much fish oil should she be taking? Aram Beecham called in and wants to know if she should be taking 2 grams like the medlist states. Is currently taking 690mg  (in 2 pills) How much calcium should she be taking also? She has the petite small calcium that equals 400mg . Is that the correct amount?

## 2012-02-12 NOTE — Telephone Encounter (Signed)
pls let her know the pravastatin is the main medication to help the cholesterol, studies have not really proven the fish oil has the same benefit. She can continue currrent fish oil dose , eat calmon, trout, tuna or mackre 2 to 3 times weekly recommended as excellent natural source of fish oil  Total calcium recommended is 1200mg  daily, in 2 to 3 divided doses(please help her with the math

## 2012-02-13 ENCOUNTER — Other Ambulatory Visit: Payer: Medicare Other

## 2012-02-14 ENCOUNTER — Other Ambulatory Visit (INDEPENDENT_AMBULATORY_CARE_PROVIDER_SITE_OTHER): Payer: Medicare Other

## 2012-02-14 ENCOUNTER — Ambulatory Visit (INDEPENDENT_AMBULATORY_CARE_PROVIDER_SITE_OTHER)
Admission: RE | Admit: 2012-02-14 | Discharge: 2012-02-14 | Disposition: A | Discharge status: Home / Self Care | Payer: Medicare Other | Source: Ambulatory Visit | Attending: Internal Medicine | Admitting: Internal Medicine

## 2012-02-14 DIAGNOSIS — R918 Other nonspecific abnormal finding of lung field: Secondary | ICD-10-CM | POA: Diagnosis not present

## 2012-02-14 DIAGNOSIS — J984 Other disorders of lung: Secondary | ICD-10-CM | POA: Diagnosis not present

## 2012-02-14 DIAGNOSIS — R911 Solitary pulmonary nodule: Secondary | ICD-10-CM

## 2012-02-14 LAB — BASIC METABOLIC PANEL
GFR: 109.69 mL/min (ref >60.00)
Potassium: 4.3 mEq/L (ref 3.5–5.1)
Sodium: 136 mEq/L (ref 135–145)

## 2012-02-14 MED ORDER — IOHEXOL 300 MG/ML  SOLN
80.0000 mL | Freq: Once | INTRAMUSCULAR | Status: AC | PRN
  Administered 2012-02-14: 80 mL via INTRAVENOUS

## 2012-02-15 ENCOUNTER — Ambulatory Visit (INDEPENDENT_AMBULATORY_CARE_PROVIDER_SITE_OTHER): Payer: Medicare Other

## 2012-02-15 ENCOUNTER — Encounter: Payer: Self-pay | Admitting: Internal Medicine

## 2012-02-15 ENCOUNTER — Ambulatory Visit (INDEPENDENT_AMBULATORY_CARE_PROVIDER_SITE_OTHER): Payer: Medicare Other | Admitting: Internal Medicine

## 2012-02-15 VITALS — BP 130/84 | HR 63 | Ht 65.0 in | Wt 128.6 lb

## 2012-02-15 DIAGNOSIS — J984 Other disorders of lung: Secondary | ICD-10-CM

## 2012-02-15 DIAGNOSIS — J45998 Other asthma: Secondary | ICD-10-CM

## 2012-02-15 DIAGNOSIS — J301 Allergic rhinitis due to pollen: Secondary | ICD-10-CM

## 2012-02-15 DIAGNOSIS — J45909 Unspecified asthma, uncomplicated: Secondary | ICD-10-CM

## 2012-02-15 DIAGNOSIS — J449 Chronic obstructive pulmonary disease, unspecified: Secondary | ICD-10-CM

## 2012-02-15 NOTE — Patient Instructions (Addendum)
We can look at your chest xray once a year or so, but the spots appear to be simple mucus plugs which are harmless.   Consider the dust and mold precautions information we gave.

## 2012-02-15 NOTE — Progress Notes (Signed)
Patient ID: Kathleen Gallegos, female    DOB: [birth year], 78 y.o.   MRN: [id number]  HPI 03/27/2011-78 year old female never smoker followed for asthma, allergic rhinitis Last here 07/24/2010-note reviewed She notices persistent left nostril stuffiness in the mornings with watery rhinorrhea but no blood or headache. This has not gotten worse in the fall weather. She continues allergy shots, believes they help her. Denies wheeze cough or shortness of breath and has not been using her rescue inhaler.  06/08/12--78 year old female never smoker followed for asthma, allergic rhinitis No significant respiratory infections this winter. Her primary physician gave Cipro for urinary infection ending yesterday. This also improved her sinus drainage which is noted mostly when she is lying down. She denies cough and says her chest feels clear.  11/07/11- 78 year old female never smoker followed for asthma, allergic rhinitis   PCP Dr Lodema Hong Family member here Scratchy throat with increased pollen; nasal drainage every morning-white in color. Nasal congestion. CT chest 10/09/2011 was reviewed with them in detail:. Dr. Lodema Hong had been good enough to send a note directing attention to her left lower lobe nodule. A thyroid nodule was also seen and biopsied "benign". IMPRESSION:  1. Left lower lobe nodule measures slightly larger than the  examination from 2008. Hounsfield unit measurements from this  nodule are similar to the thoracic aorta and nearby pulmonary  artery and veins. The differential diagnosis for this nodule  includes pulmonary AVM, slow-growing pulmonary neoplasm, or  impacted distal bronchial containing inspissated mucous.  Management strategies include follow-up imaging  to document stability. A contrast-enhanced CT of the chest and 3  months would be recommended in this case. Alternatively,  endobronchial ultrasound may be helpful for further evaluation.  This nodule may be too small to  biopsy percutaneously and is below  the size threshold for PET CT.  2. The other small nodules are stable when compared with previous  imaging.  3. Increase in size of right lobe of thyroid gland nodule.  Suggest further evaluation with thyroid sonography.  Original Report Authenticated By: Rosealee Albee, M.D.   02/15/12- 78 year old female never smoker followed for asthma, allergic rhinitis, lung nodule   PCP Dr Lodema Hong Scheduled 6 MWT was cancelled due to late arrival. Here with daughter   says breathing has been good and she can walk 2 miles a day. Blames dust and mold for hoarseness all summer. CT chest - 02/14/12 reviewed with them. IMPRESSION:  9 x 7 mm left lower lobe pulmonary opacity, favored to reflect  inspissated mucus within a bronchocele. Given only minimally  increase in size when compared to 2009, this appearance is almost  certainly benign.  If additional follow-up imaging is desired, unenhanced CT is  recommended to confirm the opacity is intrinsically hyperdense  rather than enhancing.  Original Report Authenticated By: Charline Bills, M.D.   Review of Systems-see HPI   Constitutional:   No-   weight loss, night sweats, fevers, chills, fatigue, lassitude. HEENT:   No-  headaches, difficulty swallowing, tooth/dental problems, sore throat,       No-  sneezing, itching, ear ache,  +nasal congestion, post nasal drip, hoarseness CV:  No-   chest pain, orthopnea, PND, swelling in lower extremities, anasarca, dizziness, palpitations Resp: + shortness of breath with exertion or at rest.              No-   productive cough,  No non-productive cough,  No-  coughing up of blood.  No-   change in color of mucus.  No- wheezing.   Skin: No-   rash or lesions. GI:  No-   heartburn, indigestion, abdominal pain, nausea, vomiting,  GU: MS:  No-   joint pain or swelling.   Neuro-  Psych:  No- change in mood or affect. No depression or anxiety.  No memory  loss.  OBJ- General- Alert, Oriented, Affect-appropriate, Distress- none acute, comfortable-appearing elderly woman, talkative Skin- rash-none, lesions- none, excoriation- none Lymphadenopathy- none Head- atraumatic            Eyes- Gross vision intact, PERRLA, conjunctivae clear secretions            Ears- Hearing, canals normal for age            Nose- Clear, no-Septal dev, mucus, polyps, erosion, perforation             Throat- Mallampati II , mucosa clear , drainage- none, tonsils- atrophic, missing teeth Neck- flexible , trachea midline, no stridor , thyroid nl, carotid no bruit Chest - symmetrical excursion , unlabored           Heart/CV- RRR , no murmur , no gallop  , no rub, nl s1 s2                           - JVD- none , edema- none, stasis changes- none, varices- none           Lung-  +Distant but clear to P&A, wheeze- none, cough- none , dullness-none, rub- none           Chest wall-  Abd-  Br/ Gen/ Rectal- Not done, not indicated Extrem- cyanosis- none, clubbing, none, atrophy- none, strength- nl Neuro- grossly intact to observation

## 2012-02-18 NOTE — Progress Notes (Signed)
Quick Note:  CY reviewed with patient at [facility] on 02-15-12. ______

## 2012-02-20 ENCOUNTER — Ambulatory Visit (INDEPENDENT_AMBULATORY_CARE_PROVIDER_SITE_OTHER): Payer: Medicare Other

## 2012-02-20 VITALS — BP 126/74 | Wt 125.1 lb

## 2012-02-20 DIAGNOSIS — J309 Allergic rhinitis, unspecified: Secondary | ICD-10-CM | POA: Diagnosis not present

## 2012-02-20 NOTE — Telephone Encounter (Signed)
Daughter and mother aware.

## 2012-02-20 NOTE — Progress Notes (Signed)
Pt in for weekly allergy injection.  Injection given in right arm.  No sign or symptom of adverse reaction.  No voiced complaints.  

## 2012-02-22 NOTE — Assessment & Plan Note (Signed)
Educated on environmental precautions. Especially discussed mold prevention during humid weather.

## 2012-02-22 NOTE — Assessment & Plan Note (Signed)
We can follow chest x-ray at long intervals. This appears to be a benign mucocele requiring no intervention.

## 2012-02-22 NOTE — Assessment & Plan Note (Signed)
Good control through summer months with good exercise tolerance and no changes needed.

## 2012-02-25 ENCOUNTER — Ambulatory Visit (INDEPENDENT_AMBULATORY_CARE_PROVIDER_SITE_OTHER): Payer: Medicare Other | Admitting: Internal Medicine

## 2012-02-25 ENCOUNTER — Encounter: Payer: Self-pay | Admitting: Internal Medicine

## 2012-02-25 VITALS — BP 122/80 | HR 68 | Ht 64.5 in | Wt 126.6 lb

## 2012-02-25 DIAGNOSIS — R1012 Left upper quadrant pain: Secondary | ICD-10-CM

## 2012-02-25 DIAGNOSIS — K5790 Diverticulosis of intestine, part unspecified, without perforation or abscess without bleeding: Secondary | ICD-10-CM

## 2012-02-25 DIAGNOSIS — K5730 Diverticulosis of large intestine without perforation or abscess without bleeding: Secondary | ICD-10-CM

## 2012-02-25 DIAGNOSIS — K649 Unspecified hemorrhoids: Secondary | ICD-10-CM

## 2012-02-25 DIAGNOSIS — R300 Dysuria: Secondary | ICD-10-CM

## 2012-02-25 NOTE — Patient Instructions (Addendum)
It is ok to use over the counter hemorrhoidal suppositories.  You may eat 30 minutes after taking your Nexium.   We are giving you several hand outs to read and follow today on : Hemorrhoids, Diverticulosis, and a High Fiber Diet.  Please see your PCP for the burning with urination issues.  Follow-up in one year with Dr. Leone Payor.  Thank you for choosing me and [facility].  Iva Boop, M.D., [facility]

## 2012-02-25 NOTE — Progress Notes (Signed)
  Subjective:    Patient ID: Kathleen Gallegos, female    DOB: 10-26-1925, 78 y.o.   MRN: [id number]  HPI This delightful elderly woman presents with her daughter, because of abdominal pain and a few other issues. She has noticed a mild left upper quadrant pain that occurs sometimes, after she takes her Nexium. She has been waiting 60 minutes to eat after she takes her Nexium and sometimes after about a half hour or so she will have a vague mild abdominal pain and left upper quadrant. When she eats she is fine. She is also describing some variable stool characteristics, sometimes she have clumps of balls of stool together and she wonders what is causing this. There is also intermittent burning upon urination. From talking to the patient and her daughter she was treated for atrophic vaginitis in the past with estrogen cream so she stopped those. She also has an anal protrusion that she wonders what might be she has looked with him he or. She is not suffering with significant constipation, she is not having rectal bleeding. Medications, allergies, past medical history, past surgical history, family history and social history are reviewed and updated in the EMR.  Review of Systems As per history of present illness    Objective:   Physical Exam General:  NAD Eyes:   anicteric Abdomen:  soft and nontender, BS+ Rectal exam: With female staff present, I can see some mildly swollen hemorrhoids, 2 in the anal canal. Digital rectal exam reveals normal rectal tone and no rectal mass and a small amount of brown stool. Ext:   no edema  Abdominal pelvic CT scan is reviewed including images. This shows diverticulosis of the colon.     Assessment & Plan:   1. LUQ pain   I think this might be hunger pain. She is advised to start eating 30 minutes after she takes her Nexium. May also be a mild IBS-like pain.   2. Diverticulosis   This is the cause of the clumps of balls of stool the cc at some time. High  fiber diet is recommended.   3. Dysuria   This may be from atrophic vaginitis. She is advised to discuss this with her primary care physician or her gynecologist, it sounds like she probably needs to restart her estrogen cream.   4. Hemorrhoids   As needed over-the-counter hemorrhoidal suppositories may be used. Hemorrhoid handout provided.    I will see her back routinely in 1 year, sooner if needed.  CC: [name], MD

## 2012-02-26 DIAGNOSIS — Z961 Presence of intraocular lens: Secondary | ICD-10-CM | POA: Diagnosis not present

## 2012-02-26 DIAGNOSIS — H04129 Dry eye syndrome of unspecified lacrimal gland: Secondary | ICD-10-CM | POA: Diagnosis not present

## 2012-02-28 ENCOUNTER — Ambulatory Visit (INDEPENDENT_AMBULATORY_CARE_PROVIDER_SITE_OTHER): Payer: Medicare Other

## 2012-02-28 ENCOUNTER — Telehealth: Payer: Self-pay | Admitting: Internal Medicine

## 2012-02-28 VITALS — BP 126/64 | Wt 127.0 lb

## 2012-02-28 DIAGNOSIS — M1380 Other specified arthritis, unspecified site: Secondary | ICD-10-CM | POA: Diagnosis not present

## 2012-02-28 NOTE — Telephone Encounter (Signed)
I spoke Kathleen Gallegos and she is wanting to know why dr. Maple Hudson says to refer to [facility] or [facility] since they live in [place]. She stated she needs more detail on this decision. Please advise dr. Maple Hudson thanks

## 2012-02-28 NOTE — Telephone Encounter (Signed)
Our neurologist is leaving and the replacement won't be starting soon.  Kathleen Gallegos's primary physician probably could refer her to [facility] or [facility]

## 2012-02-28 NOTE — Telephone Encounter (Signed)
Spoke with Aram Beecham. She states that she is needing referral for nerologist with [facility] for second opinion on dementia. I advised to call the pt's PCP, but she prefers asking Dr Maple Hudson first since he is with [facility]. Please advise thanks

## 2012-02-28 NOTE — Progress Notes (Signed)
Pt in for allergy injection.  Injection given in left arm.  No sign or symptom of adverse reaction.  No voice complaints. Pt to return in 1 week for next injection.

## 2012-02-29 NOTE — Telephone Encounter (Signed)
I wasn't aware of where they live when I suggested that. I just don't have a [place] neurologist to offer, so if they want somebody in [place], all we have is [facility]. Otherwise we usually have to refer out of town.

## 2012-02-29 NOTE — Telephone Encounter (Signed)
i spoke with Kathleen Gallegos she is aware of this. Nothing further needed

## 2012-03-06 ENCOUNTER — Ambulatory Visit: Payer: Medicare Other

## 2012-03-20 ENCOUNTER — Ambulatory Visit: Payer: Medicare Other

## 2012-03-27 ENCOUNTER — Ambulatory Visit (INDEPENDENT_AMBULATORY_CARE_PROVIDER_SITE_OTHER): Payer: Medicare Other

## 2012-03-27 VITALS — BP 110/80 | Wt 126.8 lb

## 2012-03-27 DIAGNOSIS — J301 Allergic rhinitis due to pollen: Secondary | ICD-10-CM | POA: Diagnosis not present

## 2012-03-28 NOTE — Progress Notes (Signed)
Patient received injection with no complications in left arm  

## 2012-04-03 ENCOUNTER — Ambulatory Visit (INDEPENDENT_AMBULATORY_CARE_PROVIDER_SITE_OTHER): Payer: Medicare Other

## 2012-04-03 VITALS — BP 120/80 | Wt 129.1 lb

## 2012-04-03 DIAGNOSIS — J301 Allergic rhinitis due to pollen: Secondary | ICD-10-CM

## 2012-04-03 NOTE — Progress Notes (Signed)
Pt received injection with no complications  

## 2012-04-08 ENCOUNTER — Encounter (HOSPITAL_COMMUNITY): Payer: Self-pay | Admitting: Psychology

## 2012-04-08 NOTE — Progress Notes (Signed)
Patient:  Kathleen Gallegos   DOB: 1926/04/02  MR Number: [id number]  Location: [facility] [address] [place] [address] Dept: [phone]  Start: 10 AM  End: 11 AM  Provider/Observer:     Hershal Coria PSYD  Chief Complaint:      Chief Complaint  Patient presents with  . Anxiety  . Stress  . Other    Hyperfocus on her medical/physical functioning    Reason For Service:     The patient was referred for psychotherapeutic interventions do to increasing worry and anxiety as well as difficulty adjusting to life changes and aging issues.  Interventions Strategy:  Cognitive/behavioral psychotherapy  Participation Level:   Active  Participation Quality:  Appropriate      Behavioral Observation:  Well Groomed, Alert, and Appropriate.   Current Psychosocial Factors: The patient reports that there've been progressive improvements in a situation between her and her daughter. She reports that she has been doing much better as far as reducing her worry and hyperfocus on file issues involving functioning. We continued work on Pharmacologist and strategies.  Content of Session:   Reviewed current symptoms and continue to work on therapeutic interventions with particular focus on her somatizations disorder.  Current Status:   The patient reports that she is adjusting much better and that as she adjusts to the stressors in her household that her physical worries and complaints of been improving.  Patient Progress:   Very good  Target Goals:   Target goals include reducing the frequency, intensity, and duration of her hyper focus and hypervigilance on her somatic issues. 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  Last Reviewed:   02/08/2011  Goals Addressed Today:    Today we were primarily with a somatizations issues.  Impression/Diagnosis:   At this point, the patient does continue to show some significant adjustment difficulties and issues but this does not appear to be anything long-standing. There are no cognitive difficulties or indications of dementia. The patient is in fact relatively healthy. However, there is a continual hyper focus and hypervigilance over her Medical Center status and she is continuing to be very concerned about his issues particular her GI functioning.  Diagnosis:    Axis I:  1. Adjustment disorder with anxiety   2. Somatization disorder         Axis II: No diagnosis

## 2012-04-09 ENCOUNTER — Ambulatory Visit (INDEPENDENT_AMBULATORY_CARE_PROVIDER_SITE_OTHER): Payer: Medicare Other

## 2012-04-09 ENCOUNTER — Ambulatory Visit (INDEPENDENT_AMBULATORY_CARE_PROVIDER_SITE_OTHER): Payer: Medicare Other | Admitting: Psychology

## 2012-04-09 ENCOUNTER — Encounter (HOSPITAL_COMMUNITY): Payer: Self-pay | Admitting: Psychology

## 2012-04-09 VITALS — BP 120/70 | Wt 128.8 lb

## 2012-04-09 DIAGNOSIS — F4322 Adjustment disorder with anxiety: Secondary | ICD-10-CM

## 2012-04-09 DIAGNOSIS — F450 Somatization disorder: Secondary | ICD-10-CM

## 2012-04-09 DIAGNOSIS — Z23 Encounter for immunization: Secondary | ICD-10-CM

## 2012-04-09 DIAGNOSIS — J301 Allergic rhinitis due to pollen: Secondary | ICD-10-CM | POA: Diagnosis not present

## 2012-04-09 NOTE — Progress Notes (Signed)
Patient:  Kathleen Gallegos   DOB: 1926-04-16  MR Number: [id number]  Location: [facility] [address] [place] [address] Dept: [phone]  Start: 11 AM  End: 12 PM  Provider/Observer:     Hershal Coria PSYD  Chief Complaint:      Chief Complaint  Patient presents with  . Anxiety  . Stress    Reason For Service:     The patient was referred for psychotherapeutic interventions do to increasing worry and anxiety as well as difficulty adjusting to life changes and aging issues.  Interventions Strategy:  Cognitive/behavioral psychotherapy  Participation Level:   Active  Participation Quality:  Appropriate      Behavioral Observation:  Well Groomed, Alert, and Appropriate.   Current Psychosocial Factors: The patient reports that she recently had a worsening of her symptoms with regard to worry about her GI system. She had some unexplained pains and other difficulties and went to see her primary care physician. She then went to other doctors and was checked out and everything was found to be fine.  They started her on probiotics thinking that maybe something related to previous antibiotic use. However, there does continue to be a lot of stressors with regard to the daughter that lives with her. The patient feels like his daughter is getting more and more dependent upon her and that she really does not need her around his she is causing more stress for the patient to help. The patient is contemplating selling her house and either moving in with another daughter or looking at an assisted living facility.  Content of Session:   Reviewed current symptoms and continue to work on therapeutic interventions with particular focus on her somatizations disorder.  Current Status:   The patient reports that she is adjusting much better and that as she adjusts to the stressors in her household that her  physical worries and complaints of been improving.  Patient Progress:   Very good  Target Goals:   Target goals include reducing the frequency, intensity, and duration of her hyper focus and hypervigilance on her somatic issues. 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  Last Reviewed:   04/10/2011  Goals Addressed Today:    Today we were primarily with a somatizations issues.  Impression/Diagnosis:   At this point, the patient does continue to show some significant adjustment difficulties and issues but this does not appear to be anything long-standing. There are no cognitive difficulties or indications of dementia. The patient is in fact relatively healthy. However, there is a continual hyper focus and hypervigilance over her Medical Center status and she is continuing to be very concerned about his issues particular her GI functioning.  Diagnosis:    Axis I:  1. Adjustment disorder with anxiety   2. Somatization disorder         Axis II: No diagnosis

## 2012-04-09 NOTE — Progress Notes (Signed)
Injection received with no complications

## 2012-04-22 ENCOUNTER — Other Ambulatory Visit: Payer: Self-pay | Admitting: Family Medicine

## 2012-04-28 ENCOUNTER — Ambulatory Visit: Payer: Self-pay | Admitting: Family Medicine

## 2012-05-01 ENCOUNTER — Ambulatory Visit (INDEPENDENT_AMBULATORY_CARE_PROVIDER_SITE_OTHER): Payer: Medicare Other

## 2012-05-01 VITALS — BP 120/80 | Wt 128.0 lb

## 2012-05-01 DIAGNOSIS — J301 Allergic rhinitis due to pollen: Secondary | ICD-10-CM | POA: Diagnosis not present

## 2012-05-01 NOTE — Progress Notes (Signed)
Received injection in right arm with no complications

## 2012-05-05 ENCOUNTER — Other Ambulatory Visit: Payer: Self-pay | Admitting: Family Medicine

## 2012-05-05 DIAGNOSIS — E785 Hyperlipidemia, unspecified: Secondary | ICD-10-CM | POA: Diagnosis not present

## 2012-05-06 LAB — LIPID PANEL
Cholesterol: 196 mg/dL (ref 0–200)
HDL: 84 mg/dL (ref >39)
Total CHOL/HDL Ratio: 2.3 Ratio
Triglycerides: 31 mg/dL (ref <150)

## 2012-05-06 LAB — HEPATIC FUNCTION PANEL
ALT: 18 U/L (ref 0–35)
AST: 24 U/L (ref 0–37)
Albumin: 4 g/dL (ref 3.5–5.2)
Total Protein: 7.4 g/dL (ref 6.0–8.3)

## 2012-05-08 ENCOUNTER — Encounter: Payer: Self-pay | Admitting: Family Medicine

## 2012-05-08 ENCOUNTER — Ambulatory Visit (INDEPENDENT_AMBULATORY_CARE_PROVIDER_SITE_OTHER): Payer: Medicare Other | Admitting: Family Medicine

## 2012-05-08 VITALS — BP 132/80 | HR 70 | Resp 18 | Ht 66.0 in | Wt 127.0 lb

## 2012-05-08 DIAGNOSIS — R002 Palpitations: Secondary | ICD-10-CM

## 2012-05-08 DIAGNOSIS — F0390 Unspecified dementia without behavioral disturbance: Secondary | ICD-10-CM

## 2012-05-08 DIAGNOSIS — D61818 Other pancytopenia: Secondary | ICD-10-CM

## 2012-05-08 DIAGNOSIS — E785 Hyperlipidemia, unspecified: Secondary | ICD-10-CM | POA: Diagnosis not present

## 2012-05-08 DIAGNOSIS — G4700 Insomnia, unspecified: Secondary | ICD-10-CM | POA: Diagnosis not present

## 2012-05-08 DIAGNOSIS — F411 Generalized anxiety disorder: Secondary | ICD-10-CM

## 2012-05-08 DIAGNOSIS — K219 Gastro-esophageal reflux disease without esophagitis: Secondary | ICD-10-CM

## 2012-05-08 DIAGNOSIS — M1990 Unspecified osteoarthritis, unspecified site: Secondary | ICD-10-CM

## 2012-05-08 DIAGNOSIS — J984 Other disorders of lung: Secondary | ICD-10-CM

## 2012-05-08 NOTE — Progress Notes (Signed)
  Subjective:    Patient ID: Kathleen Gallegos, female    DOB: 09/12/1925, 78 y.o.   MRN: [id number]  HPI Pt in for f/u of chronic problems. C/o difficulty sleeping at times due to iin her ears, both c/o have been recently investigated. Daughter c/o difficulty caring for her mother who tries to make her decisions independently, but memory    Review of Systems See HPI Denies recent fever or chills. Denies sinus pressure, nasal congestion, ear pain or sore throat. Denies chest congestion, productive cough or wheezing. Denies chest pains, palpitation, PND, orthopnea and leg swelling Denies abdominal pain, nausea, vomiting,diarrhea or constipation.   Denies dysuria, frequency, hesitancy or incontinence. Denies uncontrolled  joint pain, swelling and limitation in mobility. Denies headaches, seizures, numbness, or tingling. Denies depression,has mild ongoing  anxiety and  insomnia. Denies skin break down or rash.        Objective:   Physical Exam  Patient alert and oriented and in no cardiopulmonary distress.  HEENT: No facial asymmetry, EOMI, no sinus tenderness,  oropharynx pink and moist.  Neck decreased though adequate ROM,no adenopathy.  Chest: Clear to auscultation bilaterally.  CVS: S1, S2 no murmurs, no S3.  ABD: Soft non tender. Bowel sounds normal.  Ext: No edema  MS:   Though reduced ROM spine, shoulders, hips and knees.  Skin: Intact, no ulcerations or rash noted.  Psych: Good eye contact, normal affect. Memory impaired, mildly anxious or depressed appearing.  CNS: CN 2-12 intact, power, tone and sensation normal throughout.       Assessment & Plan:

## 2012-05-08 NOTE — Patient Instructions (Addendum)
Annual wellness in Feb 2014, please call if you need me before  You are doing well, very well, no changes in medication  Take pravastatin 20mg , THREE times per week as before, cholesterol is better   Keep active, and continue to eat a lot of fruit and vegetable  Please try and get the shingles shot if able  Memory is being tested today

## 2012-05-09 NOTE — Assessment & Plan Note (Addendum)
Pt was intolerant of exelon in the past will refer for formal assesment for help with furhter management Will contact family member and pt after visit in light of low score on memory eval to re discuss continued statin use, her previous total /HDL score was good off med despite elevated total and LDL, family has had concern re possible memory loss with statin

## 2012-05-11 DIAGNOSIS — G3184 Mild cognitive impairment, so stated: Secondary | ICD-10-CM | POA: Insufficient documentation

## 2012-05-11 NOTE — Assessment & Plan Note (Signed)
Controlled, no change in medication  

## 2012-05-11 NOTE — Assessment & Plan Note (Signed)
Mildly symptomatic at this time

## 2012-05-11 NOTE — Assessment & Plan Note (Signed)
Still experiences problems with sleep often as a result of hearing throbbing in her ear, recently had carotid doppler and also has been evaluated by cardiology, sleep  Hygiene reviewed and discussed

## 2012-05-11 NOTE — Assessment & Plan Note (Signed)
Improved and controlled on current med 

## 2012-05-11 NOTE — Assessment & Plan Note (Signed)
Improved, continue low dose statin proposed, however , since there has in the past been a concern re possible side effect of memory loss with statin therapy, will change and advise pt to discontinue statin and prob have neurology to recommend best management after neuropsych testing

## 2012-05-11 NOTE — Assessment & Plan Note (Signed)
Evaluated in June by hematology , has 6 month f/u

## 2012-05-11 NOTE — Assessment & Plan Note (Signed)
Being followed by pulmonary, most likely benign per most recent radiology report

## 2012-05-13 ENCOUNTER — Other Ambulatory Visit: Payer: Self-pay

## 2012-05-13 ENCOUNTER — Telehealth: Payer: Self-pay | Admitting: Family Medicine

## 2012-05-13 DIAGNOSIS — E785 Hyperlipidemia, unspecified: Secondary | ICD-10-CM

## 2012-05-13 NOTE — Telephone Encounter (Signed)
Spoke with pt and daughter Kathleen Gallegos. I am changing recommendation re pravastatin, recommend it be discontinued uncertain opf actual benefit re total /HDL ratio hence reduction in vascular risk. Daughter has in the past expressed concern re potential s/e of memory loss, most recent MMSE was low, and now pt needs full neuropsych eval. She had not tolerated exelon patch in the past. Arena also reports sleeping better off pravastatin, so both are in agreement to stopping for 4 months and reviewing labs.  Please order fasting lipid profile for 4 month, give to her when she next comes in

## 2012-05-14 NOTE — Telephone Encounter (Signed)
Ordered

## 2012-05-14 NOTE — Addendum Note (Signed)
Addended by: Abner Greenspan on: 05/14/2012 12:57 PM   Modules accepted: Orders

## 2012-05-21 ENCOUNTER — Ambulatory Visit (INDEPENDENT_AMBULATORY_CARE_PROVIDER_SITE_OTHER): Payer: Medicare Other

## 2012-05-21 ENCOUNTER — Ambulatory Visit (INDEPENDENT_AMBULATORY_CARE_PROVIDER_SITE_OTHER): Payer: Medicare Other | Admitting: Psychology

## 2012-05-21 VITALS — BP 112/80 | Wt 127.0 lb

## 2012-05-21 DIAGNOSIS — J301 Allergic rhinitis due to pollen: Secondary | ICD-10-CM | POA: Diagnosis not present

## 2012-05-21 DIAGNOSIS — R413 Other amnesia: Secondary | ICD-10-CM

## 2012-05-21 DIAGNOSIS — F451 Undifferentiated somatoform disorder: Secondary | ICD-10-CM

## 2012-05-21 DIAGNOSIS — F459 Somatoform disorder, unspecified: Secondary | ICD-10-CM

## 2012-05-22 NOTE — Progress Notes (Signed)
Pt received injection in left arm with no complications

## 2012-05-27 ENCOUNTER — Ambulatory Visit (INDEPENDENT_AMBULATORY_CARE_PROVIDER_SITE_OTHER): Payer: Medicare Other | Admitting: Psychology

## 2012-05-27 DIAGNOSIS — R413 Other amnesia: Secondary | ICD-10-CM

## 2012-05-30 ENCOUNTER — Encounter (HOSPITAL_COMMUNITY): Payer: Self-pay | Admitting: Psychology

## 2012-05-30 NOTE — Progress Notes (Signed)
The patient and her daughter come in today and while the patient was fairly resistant and continually denying that anything is going on with her of particular note beyond normal age related memory problems her daughter reports that there are some significant changes that she is noticing that have progressively gone on over time. The patient does acknowledge some memory problems. The patient's daughter reports that her mother's progressively forgetful and having difficulty keeping up with day-to-day situations and remembering specifics. The patient's daughter reports that personality variables are becoming more pronounced and that this changes progressive. We agreed that we will do formal neuropsychological testing consisting of the Wechsler Adult Intelligence Scale and the Wechsler Memory Scale-III to get objective measures of what is going on.

## 2012-05-30 NOTE — Progress Notes (Signed)
Today administered the Wechsler Adult Intelligence Scale-III as well as a Armed forces operational officer Scale. I will provide feedback regarding the results of this neuropsychological evaluation the patient and her daughter as well as provide these to her treating Dr. Jobe Marker, interpretation, and report writing will follow.

## 2012-06-09 ENCOUNTER — Encounter (HOSPITAL_COMMUNITY): Payer: Self-pay | Admitting: Psychology

## 2012-06-09 ENCOUNTER — Ambulatory Visit (INDEPENDENT_AMBULATORY_CARE_PROVIDER_SITE_OTHER): Payer: Medicare Other | Admitting: Psychology

## 2012-06-09 ENCOUNTER — Ambulatory Visit: Payer: Medicare Other

## 2012-06-09 DIAGNOSIS — F451 Undifferentiated somatoform disorder: Secondary | ICD-10-CM

## 2012-06-09 DIAGNOSIS — R413 Other amnesia: Secondary | ICD-10-CM

## 2012-06-09 DIAGNOSIS — F459 Somatoform disorder, unspecified: Secondary | ICD-10-CM

## 2012-06-09 NOTE — Progress Notes (Signed)
The patient was assessed on 05/27/2012.  The patient was administered a neuropsychological test battery consisting of the Wechsler Adult Intelligence Scale-III and the Wechsler Memory Scale-III.  Behavioral observations suggest that the patient was well motivated and focused on this testing and this does appear to be a valid assessment and the patient did appear to try her hardest.  While the patient was very anxious about doing as well as she  could she adapted to the testing situation fairly well and this does appear to be a fair and valid sample of her current functioning.  GENERAL INTELLECTUAL FUNCTIONING:  The patient's performance on the Weschler Adult Intellectual Scale-III, classifies her current intellectual abilities to be in the  average Range, with a Full Scale IQ score of  93, a Verbal IQ score of  90, and a Performance IQ score of  98.  Overall, her performance places her at the  32nd percentile with regard to her age-related peer group.  Analysis on Index Scores produced a Verbal Comprehension Index Score of  91, a Perceptual Organization Index Score of  93, a Working Memory Index Score of  94, and a Processing Speed Index Score of  93.   Overall, WAIS-III performance suggest that the patient's VIQ vs. PIQ were  generally consistent with one another although her performance measures looking at visual spatial and information processing speed were somewhat higher than her verbal skills.    Below are the Age-Adjusted scores for each of the individual WAIS-III subtests.  Vocabulary:    9 Similarities:    10 Arithmetic:    8 Digit Span:    10 Information:    6 Comprehension:   7 Letter Number Sequencing:  9  Picture Completion:   8 Digit Symbol:    7 Block Design:    10 Matrix Reasoning:   9 Picture Arrangement:   15 Symbol Search:   11  WAIS-III analysis reveals a VIQ score of  90, which places her at the  25th percentile.  Mild scatter was noted in subtest performance.  The  patient displayed  average performance on measures of her general vocabulary, her verbal reasoning abilities, her basic encoding abilities, and her more complex encoding abilities. Mild weaknesses were noted for her general fund of information and her social judgment and comprehension. These are likely residual effects of her weaker educational experiences.  WAIS-III analysis reveals a PIQ score of  98, which places her at the  45th percentile.  Some significant scatter was seen on these measures.   The patient displayed significant strengths on measures assessing  visual sequencing abilities.  Average performance was noted on  visual Gestalt and attention, speed in mental operations, visual analysis and organization, and overall visual scanning speed. Mild weaknesses were noted with regard to her other measures of visual scanning and visual searching abilities.  Overall, WAIS-III performance was in the  average range.  This pattern of strengths and weaknesses is globally consistent with  some individual weaknesses with regard to her general fund of information and her comprehension and social judgment. However, overall most of her scores fell in the average range there was no significant focus on any acute cognitive deficits and the measures that were actually the most consistent and hold test over time or her lowest so there were no indications of significant cognitive deterioration.     ATTENTION/CONCENTRATION AND MEMORY:  On the WMS-III, the patient obtained a Working Memory (Attention and Encoding)  Score of 99, which  is  consistent with what was predicted by her WAIS-III FSIQ of  93.  Items making up this Index suggest that auditory encoding (Letter-Number Sequencing Scaled Score =  9, Visual encoding (Spatial Span Scaled Score =  11) is  consistent.  This overall working memory score does suggest a  good ability for auditory encoding and inability to hold the information of her brief  delay.        Predicted based on FSIQ:  Actual Score  Working Memory:    95    99 Auditory Immediate:    96    71 Auditory Delayed:    96    71 Auditory Recognition Delayed:   97    70  While the patient showed quite efficient working memory her immediate auditory learning were more information is required and the need to sustain information of her couple of minutes showed significant weakness. There was a 25 point difference between predicted scores in actual scores. The patient produced the same level of performance after a more significant level of delayed she showed no significant improvement on the auditory recognition delayed component. This pattern does show that there are some significant memory weaknesses or deficits. She is able to efficiently taken the information initially she had difficulty organizing, storing, and retrieving the information after a period of delay. More in-depth analysis of various subtests suggest that this is not simply an issue of poor recall but more difficulties with storing and organization of information.  When talking to both the patient and her daughter about these symptoms and correlating them with observed patterns it does appear that this is been a steady decline for the patient the past 5 years or so. This declined has not on a stepwise pattern but more of a steady decline. He does not appear to be consistent with that typically found with Alzheimer's as there were no other cognitive deficits noted during the other types of cognitive tests assess. Given the patient's hyperlipidemia and other vascular or blood related issues it is likely that these deficits are related to aging related from these factors and not simply any focal deficits or degenerative dementia such as Alzheimer's. However, we cannot rule out all of those progressive types patterns. It is for this reason that we will repeat the same testing in 6 months to determine whether there is any  progressive deterioration in her overall functioning.   CONCLUSIONS:    The overall results of these formal and objective measures of cognitive performance suggest general cognitive functioning to be in the average range with regard to verbal reasoning abilities, information processing speed, visual scanning and visual analysis, visual sequencing, in general speed in mental operations. There were some weaknesses with regard to her general fund of information but this is likely a reflection reduce educational experience. There are some significant memory deficits noted at this point we cannot clearly determine if this is a progressive cortical decline or whether it is 1 more related to age. We will repeat these tests in 6 months to determine if there is progressive decay and decline in these measures.

## 2012-06-12 ENCOUNTER — Other Ambulatory Visit: Payer: Self-pay | Admitting: Family Medicine

## 2012-06-17 ENCOUNTER — Telehealth: Payer: Self-pay | Admitting: Family Medicine

## 2012-06-17 NOTE — Telephone Encounter (Signed)
If not home please leave it on the answering machine

## 2012-06-17 NOTE — Telephone Encounter (Signed)
Yes she can, please let her know.

## 2012-06-18 ENCOUNTER — Ambulatory Visit (INDEPENDENT_AMBULATORY_CARE_PROVIDER_SITE_OTHER): Payer: Medicare Other

## 2012-06-18 VITALS — BP 128/74 | Wt 127.0 lb

## 2012-06-18 DIAGNOSIS — J309 Allergic rhinitis, unspecified: Secondary | ICD-10-CM

## 2012-06-18 NOTE — Progress Notes (Signed)
Patient in for allergy injection.  Injection given SQ in left arm.  No sign or symptom of adverse reaction.  No voiced complaints.

## 2012-06-18 NOTE — Telephone Encounter (Signed)
Patient aware.

## 2012-06-25 ENCOUNTER — Ambulatory Visit (INDEPENDENT_AMBULATORY_CARE_PROVIDER_SITE_OTHER): Payer: Medicare Other

## 2012-06-25 VITALS — BP 122/72 | Wt 132.0 lb

## 2012-06-25 DIAGNOSIS — J309 Allergic rhinitis, unspecified: Secondary | ICD-10-CM | POA: Diagnosis not present

## 2012-06-26 ENCOUNTER — Ambulatory Visit (INDEPENDENT_AMBULATORY_CARE_PROVIDER_SITE_OTHER): Payer: Medicare Other

## 2012-06-26 DIAGNOSIS — J309 Allergic rhinitis, unspecified: Secondary | ICD-10-CM | POA: Diagnosis not present

## 2012-06-26 NOTE — Progress Notes (Signed)
Patient in for weekly allergy injection.  Injection given in right arm.  No sign or symptom of adverse reaction.

## 2012-07-10 ENCOUNTER — Ambulatory Visit (INDEPENDENT_AMBULATORY_CARE_PROVIDER_SITE_OTHER): Payer: Medicare Other

## 2012-07-10 VITALS — BP 112/70 | Wt 127.0 lb

## 2012-07-10 DIAGNOSIS — J301 Allergic rhinitis due to pollen: Secondary | ICD-10-CM

## 2012-07-10 NOTE — Progress Notes (Signed)
Received injection with no complications  

## 2012-07-14 ENCOUNTER — Encounter (HOSPITAL_COMMUNITY): Payer: Self-pay | Admitting: Psychology

## 2012-07-14 ENCOUNTER — Ambulatory Visit (INDEPENDENT_AMBULATORY_CARE_PROVIDER_SITE_OTHER): Payer: Medicare Other | Admitting: Psychology

## 2012-07-14 DIAGNOSIS — F451 Undifferentiated somatoform disorder: Secondary | ICD-10-CM

## 2012-07-14 DIAGNOSIS — F459 Somatoform disorder, unspecified: Secondary | ICD-10-CM

## 2012-07-14 DIAGNOSIS — R413 Other amnesia: Secondary | ICD-10-CM | POA: Diagnosis not present

## 2012-07-14 NOTE — Progress Notes (Signed)
Patient:  Kathleen Gallegos   DOB: April 08, 1926  MR Number: [id number]  Location: [facility] [address] [place] [address] Dept: [phone]  Start: 11 AM  End: 12 PM  Provider/Observer:     Hershal Coria PSYD  Chief Complaint:      Chief Complaint  Patient presents with  . Depression  . Anxiety  . Stress    Reason For Service:     The patient was referred for psychotherapeutic interventions do to increasing worry and anxiety as well as difficulty adjusting to life changes and aging issues.  Interventions Strategy:  Cognitive/behavioral psychotherapy  Participation Level:   Active  Participation Quality:  Appropriate      Behavioral Observation:  Well Groomed, Alert, and Appropriate.   Current Psychosocial Factors: The patient reports that she is still having a lot of stress between her and her daughter. She reports her daughter is trying to be a progressive mother figure to her and this is creating a lot of conflict as her daughter is constantly trying to tell her what to do. The patient does appear to having some memory difficulties and that her daughter is helping her there but it is clear a significant amount of stress between the 2 of them.  Content of Session:   Reviewed current symptoms and continue to work on therapeutic interventions with particular focus on her somatizations disorder.  Current Status:   The patient does display some memory problems but these are not severe.  Patient Progress:   Very good  Target Goals:   Target goals include reducing the frequency, intensity, and duration of her hyper focus and hypervigilance on her somatic issues. The patient is also to work on her adjustment particular coping with various family members where she is seen as the Education administrator and is continuing and continually asked to do things for the overall family but they're not  contributing doctor very often.  Last Reviewed:   07/14/2012   Goals Addressed Today:    Today we were primarily with a somatizations issues.  Impression/Diagnosis:   At this point, the patient does continue to show some significant adjustment difficulties and issues but this does not appear to be anything long-standing. There are no cognitive difficulties or indications of dementia. The patient is in fact relatively healthy. However, there is a continual hyper focus and hypervigilance over her [facility] status and she is continuing to be very concerned about his issues particular her GI functioning.  Diagnosis:    Axis I:  1. Memory loss   2. Somatoform disorder         Axis II: No diagnosis

## 2012-07-16 ENCOUNTER — Other Ambulatory Visit: Payer: Self-pay

## 2012-07-16 ENCOUNTER — Telehealth: Payer: Self-pay | Admitting: Family Medicine

## 2012-07-16 MED ORDER — CLORAZEPATE DIPOTASSIUM 7.5 MG PO TABS: ORAL_TABLET | ORAL | Status: DC

## 2012-07-16 NOTE — Telephone Encounter (Signed)
Daughter aware.

## 2012-07-17 ENCOUNTER — Ambulatory Visit: Payer: Medicare Other

## 2012-07-18 ENCOUNTER — Ambulatory Visit (HOSPITAL_COMMUNITY): Payer: Self-pay | Admitting: Oncology

## 2012-07-21 ENCOUNTER — Telehealth: Payer: Self-pay | Admitting: Family Medicine

## 2012-07-21 NOTE — Telephone Encounter (Signed)
Needs appt to discuss her concerns

## 2012-07-22 ENCOUNTER — Telehealth: Payer: Self-pay | Admitting: Family Medicine

## 2012-07-22 NOTE — Telephone Encounter (Signed)
Called pt and made her a appt

## 2012-07-22 NOTE — Telephone Encounter (Signed)
Has appointment scheduled for 1/15

## 2012-07-23 ENCOUNTER — Encounter: Payer: Self-pay | Admitting: Family Medicine

## 2012-07-23 ENCOUNTER — Ambulatory Visit (INDEPENDENT_AMBULATORY_CARE_PROVIDER_SITE_OTHER): Payer: Medicare Other | Admitting: Family Medicine

## 2012-07-23 VITALS — BP 118/80 | HR 67 | Resp 16 | Ht 66.0 in | Wt 123.0 lb

## 2012-07-23 DIAGNOSIS — E785 Hyperlipidemia, unspecified: Secondary | ICD-10-CM | POA: Diagnosis not present

## 2012-07-23 DIAGNOSIS — K589 Irritable bowel syndrome without diarrhea: Secondary | ICD-10-CM

## 2012-07-23 DIAGNOSIS — J309 Allergic rhinitis, unspecified: Secondary | ICD-10-CM

## 2012-07-23 DIAGNOSIS — J301 Allergic rhinitis due to pollen: Secondary | ICD-10-CM | POA: Diagnosis not present

## 2012-07-23 DIAGNOSIS — J029 Acute pharyngitis, unspecified: Secondary | ICD-10-CM

## 2012-07-23 DIAGNOSIS — F411 Generalized anxiety disorder: Secondary | ICD-10-CM

## 2012-07-23 DIAGNOSIS — J302 Other seasonal allergic rhinitis: Secondary | ICD-10-CM

## 2012-07-23 NOTE — Progress Notes (Signed)
  Subjective:    Patient ID: Kathleen Gallegos, female    DOB: [birth year], 79 y.o.   MRN: [id number]  HPI C/o progressive hoarseness with tickle in throat worse over the past approximately 2 weeks. No fever or chills, increased nasal congestion , and clear drainage. No productive cough or sputum   Review of Systems See HPI Denies recent fever or chills. Denies sinus pressure, nasal congestion, ear pain or sore throat. Denies chest congestion, productive cough or wheezing. Denies chest pains, palpitations and leg swelling Denies abdominal pain, nausea, vomiting,diarrhea or constipation.   Denies dysuria, frequency, hesitancy or incontinence. Denies joint pain, swelling and limitation in mobility.  Denies depression,or uncontrolled  anxiety or insomnia. C/o dry skin       Objective:   Physical Exam Patient alert and oriented and in no cardiopulmonary distress.  HEENT: No facial asymmetry, EOMI, no sinus tenderness,  oropharynx pink and moist,no exudate.  Neck adequate ROM, no adenopathy.  Chest: Clear to auscultation bilaterally.  CVS: S1, S2 no murmurs, no S3.  ABD: Soft non tender. Bowel sounds normal.  Ext: No edema  MS: Adequate ROM spine, shoulders, hips and knees.  Skin: Intact, no ulcerations or rash noted.  Psych: Good eye contact, normal affect. Memory intact not anxious or depressed appearing.  CNS: CN 2-12 intact, power, tone and sensation normal throughout.        Assessment & Plan:

## 2012-07-23 NOTE — Patient Instructions (Addendum)
F/u as before  Exam of the left leg is normal, circulation, sensation and skin exam is within normal  No infection on exam, rapid strep test is negative.  Symptoms are from allergies, the allergy shot today will help alot

## 2012-07-23 NOTE — Progress Notes (Signed)
Patient received her injection in the right arm with no complications

## 2012-07-25 ENCOUNTER — Encounter (HOSPITAL_COMMUNITY): Payer: Medicare Other | Attending: Oncology | Admitting: Oncology

## 2012-07-25 ENCOUNTER — Encounter (HOSPITAL_COMMUNITY): Payer: Self-pay | Admitting: Oncology

## 2012-07-25 VITALS — BP 134/83 | HR 70 | Temp 97.5°F | Resp 16 | Wt 122.2 lb

## 2012-07-25 DIAGNOSIS — D61818 Other pancytopenia: Secondary | ICD-10-CM | POA: Diagnosis not present

## 2012-07-25 NOTE — Patient Instructions (Signed)
[facility] Discharge Instructions  RECOMMENDATIONS MADE BY THE CONSULTANT AND ANY TEST RESULTS WILL BE SENT TO YOUR REFERRING PHYSICIAN.  EXAM FINDINGS BY THE PHYSICIAN TODAY AND SIGNS OR SYMPTOMS TO REPORT TO CLINIC OR PRIMARY PHYSICIAN: Exam and discussion by MD.  Bonita Quin may want to consider see Dr. Gerilyn Pilgrim about the memory loss.  We will check some blood work today and if there's anything abnormal, we will contact you.  MEDICATIONS PRESCRIBED:  none  INSTRUCTIONS GIVEN AND DISCUSSED: Report unusual bleeding or bruising.  SPECIAL INSTRUCTIONS/FOLLOW-UP: Return in 6 months to be seen by PA.  Thank you for choosing [facility] to provide your oncology and hematology care.  To afford each patient quality time with our providers, please arrive at least 15 minutes before your scheduled appointment time.  With your help, our goal is to use those 15 minutes to complete the necessary work-up to ensure our physicians have the information they need to help with your evaluation and healthcare recommendations.    Effective January 1st, 2014, we ask that you re-schedule your appointment with our physicians should you arrive 10 or more minutes late for your appointment.  We strive to give you quality time with our providers, and arriving late affects you and other patients whose appointments are after yours.    Again, thank you for choosing [facility].  Our hope is that these requests will decrease the amount of time that you wait before being seen by our physicians.       _____________________________________________________________  Should you have questions after your visit to [facility], please contact our office at [phone] between the hours of 8:30 a.m. and 5:00 p.m.  Voicemails left after 4:30 p.m. will not be returned until the following business day.  For prescription refill requests, have your pharmacy contact our office with  your prescription refill request.

## 2012-07-25 NOTE — Progress Notes (Signed)
Problem #1 intermittent pancytopenia since at least 2008 thus far without change Problem #2 memory loss, suspect cerebral vascular disease Problem #3 chronic anxiety Problem #4 hypercholesterolemia Problem #5 history of asthma Problem #6 chronic constipation She is once again accompanied by her daughter with whom she lives. She does see a psychologist but has never seen a neurologist about her dementia. I do think that is reasonable to consider. She remains asymptomatic from her pancytopenia. With her debilitation I do not think she would be a good candidate for interventional chemotherapy which is why we have not done a bone marrow aspirate and biopsy.  We will see what her blood work shows. I've recommended to her daughter a neurological consultation.  We will see her back in the future. Her daughter had a number of questions which hopefully we have answered to their satisfaction.

## 2012-07-27 DIAGNOSIS — J029 Acute pharyngitis, unspecified: Secondary | ICD-10-CM | POA: Insufficient documentation

## 2012-07-27 DIAGNOSIS — J302 Other seasonal allergic rhinitis: Secondary | ICD-10-CM | POA: Insufficient documentation

## 2012-07-27 NOTE — Assessment & Plan Note (Signed)
Dietary management only at this time due to concens re s/e of statins

## 2012-07-27 NOTE — Assessment & Plan Note (Signed)
symptoms controlled on current regime

## 2012-07-27 NOTE — Assessment & Plan Note (Signed)
Rapid strep test is negative.

## 2012-07-27 NOTE — Assessment & Plan Note (Signed)
Increased and uncontrolled symptoms, with post nasal drainage.  Pt reassured history and exam are neg for infection. Immunotherapy administered at visit

## 2012-07-27 NOTE — Assessment & Plan Note (Signed)
Unchanged

## 2012-07-28 ENCOUNTER — Telehealth: Payer: Self-pay | Admitting: Family Medicine

## 2012-07-28 LAB — POCT RAPID STREP A (OFFICE): Rapid Strep A Screen: NEGATIVE

## 2012-07-28 NOTE — Telephone Encounter (Signed)
Patient aware of when she received flu shot

## 2012-07-28 NOTE — Addendum Note (Signed)
Addended by: Abner Greenspan on: 07/28/2012 07:51 AM   Modules accepted: Orders

## 2012-07-29 ENCOUNTER — Other Ambulatory Visit: Payer: Self-pay | Admitting: Family Medicine

## 2012-07-29 DIAGNOSIS — Z139 Encounter for screening, unspecified: Secondary | ICD-10-CM

## 2012-08-04 ENCOUNTER — Ambulatory Visit (INDEPENDENT_AMBULATORY_CARE_PROVIDER_SITE_OTHER): Payer: Medicare Other

## 2012-08-04 VITALS — BP 124/76 | Wt 126.0 lb

## 2012-08-04 DIAGNOSIS — J309 Allergic rhinitis, unspecified: Secondary | ICD-10-CM | POA: Diagnosis not present

## 2012-08-05 NOTE — Progress Notes (Signed)
Patient in for weekly allergy injection.  Injection given in right arm.  No sign or symptoms of adverse reaction.  No voiced complaints.

## 2012-08-14 ENCOUNTER — Ambulatory Visit (INDEPENDENT_AMBULATORY_CARE_PROVIDER_SITE_OTHER): Payer: Medicare Other | Admitting: Psychology

## 2012-08-14 ENCOUNTER — Ambulatory Visit (INDEPENDENT_AMBULATORY_CARE_PROVIDER_SITE_OTHER): Payer: Medicare Other

## 2012-08-14 ENCOUNTER — Ambulatory Visit (INDEPENDENT_AMBULATORY_CARE_PROVIDER_SITE_OTHER): Payer: Medicare Other | Admitting: Otolaryngology

## 2012-08-14 VITALS — BP 130/70 | Wt 125.0 lb

## 2012-08-14 DIAGNOSIS — H6120 Impacted cerumen, unspecified ear: Secondary | ICD-10-CM | POA: Diagnosis not present

## 2012-08-14 DIAGNOSIS — F451 Undifferentiated somatoform disorder: Secondary | ICD-10-CM | POA: Diagnosis not present

## 2012-08-14 DIAGNOSIS — J343 Hypertrophy of nasal turbinates: Secondary | ICD-10-CM | POA: Diagnosis not present

## 2012-08-14 DIAGNOSIS — R413 Other amnesia: Secondary | ICD-10-CM

## 2012-08-14 DIAGNOSIS — F459 Somatoform disorder, unspecified: Secondary | ICD-10-CM

## 2012-08-14 DIAGNOSIS — J301 Allergic rhinitis due to pollen: Secondary | ICD-10-CM

## 2012-08-14 NOTE — Progress Notes (Signed)
Patient received injection with no complications.

## 2012-08-20 ENCOUNTER — Ambulatory Visit: Payer: Medicare Other

## 2012-08-25 ENCOUNTER — Telehealth: Payer: Self-pay | Admitting: Family Medicine

## 2012-08-25 NOTE — Telephone Encounter (Signed)
Nose, and left hurts watery and ache over it and does she go to Dr Maple Hudson or Dr. Suszanne Conners please let her know gave the patient Dr. Luther Hearing number Merry Proud standing here and showed and stated to call  Dr. Suszanne Conners patient is aware

## 2012-08-27 ENCOUNTER — Ambulatory Visit (INDEPENDENT_AMBULATORY_CARE_PROVIDER_SITE_OTHER): Payer: Medicare Other

## 2012-08-27 VITALS — BP 122/70 | Wt 125.0 lb

## 2012-08-27 DIAGNOSIS — J309 Allergic rhinitis, unspecified: Secondary | ICD-10-CM

## 2012-08-27 NOTE — Progress Notes (Signed)
Patient in for weekly allergy injection.  Injection given in Left arm.  No sign of adverse reaction.  No voiced complaints.  Patient aware that she should return in 7 days.

## 2012-09-01 ENCOUNTER — Encounter (HOSPITAL_COMMUNITY): Payer: Self-pay | Admitting: Psychology

## 2012-09-01 ENCOUNTER — Ambulatory Visit (HOSPITAL_COMMUNITY)
Admission: RE | Admit: 2012-09-01 | Discharge: 2012-09-01 | Disposition: A | Discharge status: Home / Self Care | Payer: Medicare Other | Source: Ambulatory Visit | Attending: Family Medicine | Admitting: Family Medicine

## 2012-09-01 DIAGNOSIS — Z1231 Encounter for screening mammogram for malignant neoplasm of breast: Secondary | ICD-10-CM | POA: Diagnosis not present

## 2012-09-01 DIAGNOSIS — Z139 Encounter for screening, unspecified: Secondary | ICD-10-CM

## 2012-09-01 NOTE — Progress Notes (Signed)
Patient:  Kathleen Gallegos   DOB: 1926/01/14  MR Number: [id number]  Location: BEHAVIORAL [facility] [address] [place] [address] Dept: [phone]  Start: 11 AM  End: 12 PM  Provider/Observer:     Hershal Coria PSYD  Chief Complaint:      Chief Complaint  Patient presents with  . Depression  . Anxiety    Reason For Service:     The patient was referred for psychotherapeutic interventions do to increasing worry and anxiety as well as difficulty adjusting to life changes and aging issues.  Interventions Strategy:  Cognitive/behavioral psychotherapy  Participation Level:   Active  Participation Quality:  Appropriate      Behavioral Observation:  Well Groomed, Alert, and Appropriate.   Current Psychosocial Factors: The patient reports that the situation between her and her daughter has continued to progress and improved. She reports that there talking more about how they're going to cope with each other as both of the need the other one to some degree and the patient is benefiting from happen her daughter there to help take care of her.  Content of Session:   Reviewed current symptoms and continue to work on therapeutic interventions with particular focus on her somatizations disorder.  Current Status:   The patient reports that there've been no changes in her memory function recently in that her symptoms of depression and continued to improve.  Patient Progress:   Very good  Target Goals:   Target goals include reducing the frequency, intensity, and duration of her hyper focus and hypervigilance on her somatic issues. 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  Last Reviewed:   08/14/2012    Goals Addressed Today:    Today we were primarily with a somatizations issues.  Impression/Diagnosis:   At this point, the patient does continue to show some significant adjustment difficulties and issues but this does not appear to be anything long-standing. There are no cognitive difficulties or indications of dementia. The patient is in fact relatively healthy. However, there is a continual hyper focus and hypervigilance over her Medical Center status and she is continuing to be very concerned about his issues particular her GI functioning.  Diagnosis:    Axis I:  Somatoform disorder  Memory loss      Axis II: No diagnosis

## 2012-09-03 ENCOUNTER — Ambulatory Visit: Payer: Medicare Other | Admitting: Family Medicine

## 2012-09-04 ENCOUNTER — Ambulatory Visit (INDEPENDENT_AMBULATORY_CARE_PROVIDER_SITE_OTHER): Payer: Medicare Other

## 2012-09-04 VITALS — BP 120/66 | Wt 124.1 lb

## 2012-09-04 DIAGNOSIS — J309 Allergic rhinitis, unspecified: Secondary | ICD-10-CM

## 2012-09-04 NOTE — Progress Notes (Signed)
Patient in for weekly allergy injection.  Given SQ in right arm.  No sign or symptom of adverse reaction.  No voiced complaints.  Patient to return in one week for next injection.

## 2012-09-10 ENCOUNTER — Ambulatory Visit (INDEPENDENT_AMBULATORY_CARE_PROVIDER_SITE_OTHER): Payer: Medicare Other

## 2012-09-10 VITALS — BP 118/80 | Wt 123.0 lb

## 2012-09-10 DIAGNOSIS — J301 Allergic rhinitis due to pollen: Secondary | ICD-10-CM

## 2012-09-10 NOTE — Progress Notes (Signed)
Patient received injection in left arm with no complications

## 2012-09-11 ENCOUNTER — Ambulatory Visit (INDEPENDENT_AMBULATORY_CARE_PROVIDER_SITE_OTHER): Payer: Medicare Other | Admitting: Psychology

## 2012-09-11 ENCOUNTER — Encounter (HOSPITAL_COMMUNITY): Payer: Self-pay | Admitting: Psychology

## 2012-09-11 DIAGNOSIS — F451 Undifferentiated somatoform disorder: Secondary | ICD-10-CM

## 2012-09-11 DIAGNOSIS — F459 Somatoform disorder, unspecified: Secondary | ICD-10-CM

## 2012-09-11 DIAGNOSIS — R413 Other amnesia: Secondary | ICD-10-CM | POA: Diagnosis not present

## 2012-09-11 NOTE — Progress Notes (Signed)
Patient:  Kathleen Gallegos   DOB: 01-May-1926  MR Number: [id number]  Location: BEHAVIORAL [facility] [address] [place] [address] Dept: [phone]  Start: 11 AM  End: 12 PM  Provider/Observer:     Hershal Coria PSYD  Chief Complaint:      Chief Complaint  Patient presents with  . Anxiety  . Stress  . Memory Loss    Reason For Service:     The patient was referred for psychotherapeutic interventions do to increasing worry and anxiety as well as difficulty adjusting to life changes and aging issues.  Interventions Strategy:  Cognitive/behavioral psychotherapy  Participation Level:   Active  Participation Quality:  Appropriate      Behavioral Observation:  Well Groomed, Alert, and Appropriate.   Current Psychosocial Factors: The patient reports that she is still struggling with her relationship with her daughter who was living in a house with her. However, the patient reports that there continues to be improvement in the situation and she has been actively working on therapeutic interventions that we have develop.  Content of Session:   Reviewed current symptoms and continue to work on therapeutic interventions with particular focus on her somatizations disorder.  Current Status:   The patient reports it is continued to have some memory difficulties but there has not been any apparent worsening of the symptoms and clearly her pattern is not consistent with that of degenerative condition such as Alzheimer's.  Patient Progress:   Very good  Target Goals:   Target goals include reducing the frequency, intensity, and duration of her hyper focus and hypervigilance on her somatic issues. 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  Last Reviewed:   09/11/2012   Goals Addressed Today:    Today we were primarily with a somatizations issues.  Impression/Diagnosis:   At this point, the patient does continue to show some significant adjustment difficulties and issues but this does not appear to be anything long-standing. There are no cognitive difficulties or indications of dementia. The patient is in fact relatively healthy. However, there is a continual hyper focus and hypervigilance over her Medical Center status and she is continuing to be very concerned about his issues particular her GI functioning.  Diagnosis:    Axis I:  Somatoform disorder  Memory loss      Axis II: No diagnosis

## 2012-09-19 ENCOUNTER — Ambulatory Visit (INDEPENDENT_AMBULATORY_CARE_PROVIDER_SITE_OTHER): Payer: Medicare Other

## 2012-09-19 VITALS — BP 130/80 | Wt 121.8 lb

## 2012-09-19 DIAGNOSIS — J301 Allergic rhinitis due to pollen: Secondary | ICD-10-CM

## 2012-09-19 NOTE — Progress Notes (Signed)
In to get her weekly allergy injection. No complications noted

## 2012-09-24 ENCOUNTER — Ambulatory Visit (INDEPENDENT_AMBULATORY_CARE_PROVIDER_SITE_OTHER): Payer: Medicare Other

## 2012-09-24 VITALS — BP 128/78 | Wt 121.0 lb

## 2012-09-24 DIAGNOSIS — J301 Allergic rhinitis due to pollen: Secondary | ICD-10-CM

## 2012-09-24 NOTE — Progress Notes (Signed)
Injection given with no complications in right arm

## 2012-10-02 ENCOUNTER — Ambulatory Visit: Payer: Medicare Other

## 2012-10-02 ENCOUNTER — Ambulatory Visit: Payer: Self-pay

## 2012-10-07 ENCOUNTER — Ambulatory Visit (INDEPENDENT_AMBULATORY_CARE_PROVIDER_SITE_OTHER): Payer: Medicare Other | Admitting: Family Medicine

## 2012-10-07 ENCOUNTER — Encounter: Payer: Self-pay | Admitting: Family Medicine

## 2012-10-07 VITALS — BP 130/64 | HR 77 | Resp 18 | Ht 66.0 in | Wt 125.1 lb

## 2012-10-07 DIAGNOSIS — J45998 Other asthma: Secondary | ICD-10-CM

## 2012-10-07 DIAGNOSIS — R5383 Other fatigue: Secondary | ICD-10-CM | POA: Diagnosis not present

## 2012-10-07 DIAGNOSIS — R5381 Other malaise: Secondary | ICD-10-CM

## 2012-10-07 DIAGNOSIS — Z79899 Other long term (current) drug therapy: Secondary | ICD-10-CM | POA: Diagnosis not present

## 2012-10-07 DIAGNOSIS — J45909 Unspecified asthma, uncomplicated: Secondary | ICD-10-CM

## 2012-10-07 DIAGNOSIS — F411 Generalized anxiety disorder: Secondary | ICD-10-CM

## 2012-10-07 DIAGNOSIS — Z1211 Encounter for screening for malignant neoplasm of colon: Secondary | ICD-10-CM | POA: Diagnosis not present

## 2012-10-07 DIAGNOSIS — E785 Hyperlipidemia, unspecified: Secondary | ICD-10-CM

## 2012-10-07 DIAGNOSIS — K219 Gastro-esophageal reflux disease without esophagitis: Secondary | ICD-10-CM

## 2012-10-07 DIAGNOSIS — G4700 Insomnia, unspecified: Secondary | ICD-10-CM

## 2012-10-07 DIAGNOSIS — J301 Allergic rhinitis due to pollen: Secondary | ICD-10-CM

## 2012-10-07 MED ORDER — BUSPIRONE HCL 5 MG PO TABS: ORAL_TABLET | ORAL | Status: DC

## 2012-10-07 MED ORDER — METOPROLOL SUCCINATE ER 25 MG PO TB24: ORAL_TABLET | ORAL | Status: DC

## 2012-10-07 NOTE — Patient Instructions (Addendum)
Annual wellness in July, Kathleen Gallegos needs to be present please  Fasting lipid, chem 7, TSH and CBC in July before visit   Use lotion and baby oil twice daily to entire skin for dry skin  Please start daily ensure, and you will be referred to dietitian for counselling on healthy eating  Rectal exam today   I suggest you spend some time visiting  your other daughters to get a break and to give Kathleen Gallegos a break  Keep discussing your property issues with your children

## 2012-10-08 ENCOUNTER — Telehealth (HOSPITAL_COMMUNITY): Payer: Self-pay | Admitting: Dietician

## 2012-10-08 ENCOUNTER — Telehealth: Payer: Self-pay | Admitting: Family Medicine

## 2012-10-08 LAB — HEMOCCULT GUIAC POC 1CARD (OFFICE)

## 2012-10-08 NOTE — Telephone Encounter (Signed)
meds sent 4/2 afternoon

## 2012-10-08 NOTE — Telephone Encounter (Signed)
Called and spoke with pt daughter. Appointment scheduled for 10/13/12 at 10:00 AM.

## 2012-10-08 NOTE — Telephone Encounter (Signed)
Received referral via fax from Dr. Lodema Hong for dx: hyperlipidemia.

## 2012-10-08 NOTE — Progress Notes (Signed)
Patient received weekly allergy injection.  Injection given in left arm.  No sign or symptom of adverse reaction.  No voiced complaints.

## 2012-10-10 ENCOUNTER — Encounter (HOSPITAL_COMMUNITY): Payer: Self-pay | Admitting: Psychology

## 2012-10-10 ENCOUNTER — Ambulatory Visit (INDEPENDENT_AMBULATORY_CARE_PROVIDER_SITE_OTHER): Payer: Medicare Other | Admitting: Psychology

## 2012-10-10 DIAGNOSIS — F451 Undifferentiated somatoform disorder: Secondary | ICD-10-CM

## 2012-10-10 DIAGNOSIS — F4322 Adjustment disorder with anxiety: Secondary | ICD-10-CM | POA: Diagnosis not present

## 2012-10-10 DIAGNOSIS — R413 Other amnesia: Secondary | ICD-10-CM | POA: Diagnosis not present

## 2012-10-10 DIAGNOSIS — F459 Somatoform disorder, unspecified: Secondary | ICD-10-CM

## 2012-10-10 NOTE — Progress Notes (Signed)
Patient:  Kathleen Gallegos   DOB: [birth year]  MR Number: [id number]  Location: BEHAVIORAL [facility] [address] [place] [address] Dept: [phone]  Start: 11 AM  End: 12 PM  Provider/Observer:     Hershal Coria PSYD  Chief Complaint:      Chief Complaint  Patient presents with  . Anxiety  . Stress    Reason For Service:     The patient was referred for psychotherapeutic interventions do to increasing worry and anxiety as well as difficulty adjusting to life changes and aging issues.  Interventions Strategy:  Cognitive/behavioral psychotherapy  Participation Level:   Active  Participation Quality:  Appropriate      Behavioral Observation:  Well Groomed, Alert, and Appropriate.   Current Psychosocial Factors: The patient reports that the situation between her and her daughter has improved a great deal. The patient out of the regarding how to be patient with others and this is been quite helpful to her. The patient reports that they did find a nodule on her thyroid but there was a biopsy done and was benign.   Content of Session:   Reviewed current symptoms and continue to work on therapeutic interventions with particular focus on her somatizations disorder.  Current Status:   the patient reports that he recently found a nodule on her thyroid and there was a biopsy done and it was benign. The patient reports that she handled the situation very well and much better than she had in the past. She reports that she did not obsessive about this her worry about it extensively.   Patient Progress:   Very good  Target Goals:   Target goals include reducing the frequency, intensity, and duration of her hyper focus and hypervigilance on her somatic issues. 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  Last Reviewed:   10/10/2012   Goals Addressed Today:    Today we were primarily with a somatizations issues.  Impression/Diagnosis:   At this point, the patient does continue to show some significant adjustment difficulties and issues but this does not appear to be anything long-standing. There are no cognitive difficulties or indications of dementia. The patient is in fact relatively healthy. However, there is a continual hyper focus and hypervigilance over her Medical Center status and she is continuing to be very concerned about his issues particular her GI functioning.  Diagnosis:    Axis I:  Somatoform disorder  Memory loss  Adjustment disorder with anxiety      Axis II: No diagnosis

## 2012-10-12 NOTE — Assessment & Plan Note (Signed)
Controlled, no change in medication  

## 2012-10-12 NOTE — Assessment & Plan Note (Signed)
Being managed with fish oil only at this time primarily due to concern voiced re possibility of memory loss on statin

## 2012-10-12 NOTE — Progress Notes (Signed)
  Subjective:    Patient ID: Kathleen Gallegos, female    DOB: 06/02/1926, 79 y.o.   MRN: [id number]  HPI The PT is here for follow up and re-evaluation of chronic medical conditions, medication management and review of any available recent lab and radiology data.  Preventive health is updated, specifically  Cancer screening and Immunization.   Questions or concerns regarding consultations or procedures which the PT has had in the interim are  addressed. The PT denies any adverse reactions to current medications since the last visit.  There are no new concerns.  C/o stress living with her daughter . And concerns over her property and tax payment, which is an annual complaint, I encourage her to have her children together decide on her property management    Review of Systems See HPI Denies recent fever or chills. Denies sinus pressure, nasal congestion, ear pain or sore throat. Denies chest congestion, productive cough or wheezing. Denies chest pains, palpitations and leg swelling Denies abdominal pain, nausea, vomiting,diarrhea or constipation.   Denies dysuria, frequency, hesitancy or incontinence. Denies disabling  joint pain, swelling and limitation in mobility. Denies headaches, seizures, numbness, or tingling. Denies depression, anxiety or insomnia. Denies skin break down or rash.c/o dry skin        Objective:   Physical Exam Patient alert and oriented and in no cardiopulmonary distress.  HEENT: No facial asymmetry, EOMI, no sinus tenderness,  oropharynx pink and moist.  Neck supple no adenopathy.  Chest: Clear to auscultation bilaterally.  CVS: S1, S2 no murmurs, no S3.  ABD: Soft non tender. Bowel sounds normal. Rectal: no mass , heme negative stool Ext: No edema  MS: Adequate ROM spine, shoulders, hips and knees.  Skin: Intact, no ulcerations or rash noted.  Psych: Good eye contact, normal affect. Memory intact not anxious or depressed appearing.  CNS: CN  2-12 intact, power, tone and sensation normal throughout.        Assessment & Plan:

## 2012-10-12 NOTE — Assessment & Plan Note (Signed)
Continue tranxene as before. Sleep hygiene also reviewed

## 2012-10-12 NOTE — Assessment & Plan Note (Signed)
Continues with immunotherapy weekly, stable and controlled

## 2012-10-12 NOTE — Assessment & Plan Note (Signed)
Stable, pt sees therapist once monthly

## 2012-10-13 ENCOUNTER — Telehealth (HOSPITAL_COMMUNITY): Payer: Self-pay | Admitting: Dietician

## 2012-10-13 ENCOUNTER — Other Ambulatory Visit (INDEPENDENT_AMBULATORY_CARE_PROVIDER_SITE_OTHER): Payer: Self-pay | Admitting: Otolaryngology

## 2012-10-13 NOTE — Telephone Encounter (Signed)
Received voicemails from pt daughter left on 0839, [phone], and [phone]. Requesting rescheduling appointment due to weather (rain). Called back at 0940 and spoke with Aram Beecham, pt daughter. Appointment rescheduled for 10/20/12 at 1100.

## 2012-10-13 NOTE — Telephone Encounter (Signed)
fyi

## 2012-10-16 ENCOUNTER — Other Ambulatory Visit (HOSPITAL_COMMUNITY): Payer: Self-pay

## 2012-10-16 ENCOUNTER — Ambulatory Visit: Payer: Medicare Other

## 2012-10-20 ENCOUNTER — Encounter (HOSPITAL_COMMUNITY): Payer: Self-pay | Admitting: Dietician

## 2012-10-20 ENCOUNTER — Ambulatory Visit: Payer: Self-pay

## 2012-10-20 NOTE — Progress Notes (Signed)
Outpatient Initial Nutrition Assessment  Date:10/20/2012   Appt Start Time: 1102  Referring Physician: Dr. Lodema Hong Reason for Visit: hyperlipidemia, wt loss  Nutrition Assessment:  Height: 5\' 6"  (167.6 cm)   Weight: 118 lb (53.524 kg)   IBW: 130# %IBW: 91% UBW: 130# %UBW: 91% Body mass index is 19.05 kg/(m^2). Classified as normal weight.  Goal Weight: Weight hx: Wt Readings from Last 10 Encounters:  10/20/12 118 lb (53.524 kg)  10/07/12 125 lb 1.3 oz (56.736 kg)  09/24/12 121 lb (54.885 kg)  09/19/12 121 lb 12.8 oz (55.248 kg)  09/10/12 123 lb (55.792 kg)  09/04/12 124 lb 1.9 oz (56.3 kg)  08/27/12 125 lb 0.6 oz (56.718 kg)  08/14/12 125 lb (56.7 kg)  08/05/12 126 lb (57.153 kg)  07/25/12 122 lb 3.2 oz (55.43 kg)   Current weight is lowest weight. Pt reports weight has been "up and down" over the past few years. She believes she lost some weight over the weekend, due to not eating as well as she should, as she was travelling. UBW was in the 130's over the past several years. Noted 2.5% wt loss x 1 month, which is not clinically significant.   Estimated nutritional needs: 1340-1609 kcals daily, 53-64 grams protein daily, 1 ml/kcal daily  PMH:  Past Medical History  Diagnosis Date  . IBS (irritable bowel syndrome)   . Anxiety disorder   . Hyperlipidemia   . Lung nodule   . Angioneurotic edema not elsewhere classified   . Allergic rhinitis, cause unspecified   . Asthma   . Carpal tunnel syndrome   . Chronic abdominal pain   . GERD (gastroesophageal reflux disease)   . Insomnia   . Palpitations   . Osteoarthritis   . Senile osteoporosis   . Fecal incontinence   . Carotid bruit   . Pancytopenia     Medications:  Current Outpatient Rx  Name  Route  Sig  Dispense  Refill  . ADVAIR DISKUS 250-50 MCG/DOSE AEPB      INHALE 1 PUFF TWICE DAILY. RINSE AFTER USE   60 each   1   . busPIRone (BUSPAR) 5 MG tablet      TAKE ONE TABLET BY MOUTH TWICE DAILY   60 tablet    4   . Calcium Citrate-Vitamin D (CITRACAL + D PO)   Oral   Take 1 tablet by mouth. 250iu of Vitamin D and 200mg  of Calcium daily         . Carboxymeth-Glycerin-Polysorb (REFRESH OPTIVE ADVANCED OP)   Ophthalmic   Apply to eye. 1-2 droops in each eye as needed/directed          . clorazepate (TRANXENE) 7.5 MG tablet      Take 1/2 tab in the am and 1 tab at bedtime   45 tablet   3   . docusate sodium (COLACE) 100 MG capsule   Oral   Take 100 mg by mouth as needed.          Marland Kitchen EPINEPHrine (EPIPEN) 0.3 mg/0.3 mL DEVI   Intramuscular   Inject 0.3 mg into the muscle once. For severe allergic reaction          . fish oil-omega-3 fatty acids 1000 MG capsule   Oral   Take 690 mg by mouth daily.          . Lactobacillus Rhamnosus, GG, (CULTURELLE PO)   Oral   Take 1 tablet by mouth daily.         Marland Kitchen  metoprolol succinate (TOPROL-XL) 25 MG 24 hr tablet      TAKE ONE HALF TABLET BY MOUTH TWICE DAILY   30 tablet   4   . mometasone (NASONEX) 50 MCG/ACT nasal spray   Nasal   Place 2 sprays into the nose daily.         Marland Kitchen omeprazole (PRILOSEC OTC) 20 MG tablet   Oral   Take 20 mg by mouth daily.         . polyethylene glycol powder (MIRALAX) powder   Oral   Take 17 g by mouth daily. Take half dose daily as needed              Labs: CMP     Component Value Date/Time   NA 136 02/14/2012 1336   K 4.3 02/14/2012 1336   CL 101 02/14/2012 1336   CO2 30 02/14/2012 1336   GLUCOSE 87 02/14/2012 1336   BUN 14 02/14/2012 1336   CREATININE 0.7 02/14/2012 1336   CREATININE 0.88 11/22/2011 0710   CALCIUM 9.0 02/14/2012 1336   PROT 7.4 05/05/2012 0850   ALBUMIN 4.0 05/05/2012 0850   AST 24 05/05/2012 0850   ALT 18 05/05/2012 0850   ALKPHOS 28* 05/05/2012 0850   BILITOT 0.6 05/05/2012 0850   GFRNONAA 59* 09/22/2011 1326   GFRAA 69* 09/22/2011 1326    Lipid Panel     Component Value Date/Time   CHOL 196 05/05/2012 0850   TRIG 31 05/05/2012 0850   HDL 84 05/05/2012 0850    CHOLHDL 2.3 05/05/2012 0850   VLDL 6 05/05/2012 0850   LDLCALC 106* 05/05/2012 0850     No results found for this basename: HGBA1C   Lab Results  Component Value Date   LDLCALC 106* 05/05/2012   CREATININE 0.7 02/14/2012     Lifestyle/ social habits: Ms. Pumphrey lives in [place] with her daughter, Aram Beecham, who is very involved in her care. 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 Pt daughter reports that pt is very independent; she still sews and drives and complete many of her ADLs independently or with minimal assistance. She reports her stress level between a 6-8/10. She is currently seeing a therapist once a month for anxiety. Pt daughter reports that living with her mother and being a full time caregiver is very stressful on both of them. They are in the process of deciding a plan for the future, as pt has been considering selling her home and have been talking about assisted living placement vs. Living with family members. There is also a large control issue between patient and patient daughter. Pt daughter reports that pt does have some mild dementia. Pt is not socialized much, spending most of her time in her home with her daughter.  Nutrition hx/habits: Ms. Bridge reports that she has a good appetite. She reports that she eats "healthy food" and is very conscious of her diet. She reports she eats mainly fish, chicken, and vegetables. Per pt daughter, pt has now chewing or swallowing issues. Pt daughter feels like pt is "too careful" about what she eats. Her weight has been "up and down" over the past year or so. She reports she has always been small and never had a weight problem. She reports most of her family has been small framed, very thin and tall. Per pt daughter, pt can get preoccupied with her sewing and other activities. She also reports that pt's stress and possible depression or anxiety may  affect her appetite. Pt reports that she soaks her cereals over  night so her stomach won't get full. Upon questioning, pt continues to describe "fullness" in her stomach. Pt daughter reports that pt has never been diagnosed with gastroparesis, but reports that Dr. Leone Payor told her she had a "lazy colon". Pt also drinks as much as 48-56 oz of water daily, often drinking large quantities of water before meals.   Diet recall: Pt needed to be redirected several times during interview. Unable to obtain full diet recall. Pt reports she eats mostly vegetables and drinks mostly water. Breakfast is the largest meal of the day. Pt reports to nibbling throughout the day. She consumes at least 48-56 oz of water daily, often drinking large quantities of water before meals. She reports breakfast consists of: 2 slices of [place] bacon, 2 slices of whole wheat bread, 2 glasses of water, 1 egg, cereal (cornflakes and bran flakes combined), apple. She reports she often does not eat all this food at once, but "nibbles" every few hours throughout the day.   Nutrition Diagnosis: Inadequate oral food intake r/t decreased appetite AEB 2.5% wt loss x 1 month.   Nutrition Intervention: Nutrition rx: 1500 kcal, liberalized diet; decrease water intake (especially before meals) and drink calorie containing liquids (ex. Ensure, milk, juice); 3 meals per day; consistent meal schedule; include favorite and new foods in diet  Education/Counseling Provided: Most of the visit was spent on counseling how to maximize pt intake. Had long discussion with pt and pt daughter about weight hx and harms of unintentional weight loss to overall health. Discussed importance of weight maintenance instead of focusing on loss or gain and not to fixate oin a certain number on the scale. Discussed BMI and different body compositions. Reviewed diet recall. Educated pt on plate method and a general, healthful diet that includes low fat dairy, lean meats, whole fruits and vegetables, and whole grains most often. Also  discussed liberalizing diet on special occasions, such as going out to eat and during holidays, vacations, and celebrations. Discussed importance of decreasing noncaloric fluid intake before meals to help increase appetite. Discussed alternative beverages (other than water) that will increase calorie intake, such as milk, juice, tea, and Ensure. Discussed how to increase calorie of food by adding butter, fat, protein powder, and powdered milk to dishes and using gravies and sauces as a way to maximize intake. Discussed importance of a structured meal pattern for consistency and to getting a better picture of what pt was eating. Also discussed importance of socialization and encouraged pt eat with other seniors and go out to eat. Encouraged pt to incorporate both new and familiar foods in diet. Discussed possibility of offering smaller, more frequent meals to maximize intake.Used TeachBack to assess understanding.   Understanding, Motivation, Ability to Follow Recommendations: Expect fair compliance.  Monitoring and Evaluation: Goals: 1) Weight maintenance; 2) 3 meals per day; 3) Ensure 1-2 times daily; 4) Calorie containing beverages  Recommendations: 1) Small, frequent meals; 2) Structured schedule and eating pattern; 3) Investigate local senior programs for socialization; 4) Pt may be a good candidate for respite care   F/U: Will follow-up with phone call in 1-2 months to re-evaluate progress.   Melody Haver, RD, LDN 10/20/2012  Appt EndTime: 1415

## 2012-10-23 ENCOUNTER — Ambulatory Visit: Payer: Medicare Other

## 2012-10-23 ENCOUNTER — Ambulatory Visit (HOSPITAL_COMMUNITY)
Admission: RE | Admit: 2012-10-23 | Discharge: 2012-10-23 | Disposition: A | Discharge status: Home / Self Care | Payer: Medicare Other | Source: Ambulatory Visit | Attending: Otolaryngology | Admitting: Otolaryngology

## 2012-10-23 DIAGNOSIS — E079 Disorder of thyroid, unspecified: Secondary | ICD-10-CM | POA: Insufficient documentation

## 2012-10-23 DIAGNOSIS — E042 Nontoxic multinodular goiter: Secondary | ICD-10-CM | POA: Diagnosis not present

## 2012-10-30 ENCOUNTER — Ambulatory Visit (INDEPENDENT_AMBULATORY_CARE_PROVIDER_SITE_OTHER): Payer: Medicare Other

## 2012-10-30 VITALS — BP 128/68 | Wt 124.0 lb

## 2012-10-30 DIAGNOSIS — J301 Allergic rhinitis due to pollen: Secondary | ICD-10-CM

## 2012-10-30 DIAGNOSIS — J309 Allergic rhinitis, unspecified: Secondary | ICD-10-CM

## 2012-10-30 NOTE — Progress Notes (Signed)
Patient in for weekly allergy injection.  Injection given in right arm.  No sign or symptom of adverse reaction.  No voiced complaints.  Patient aware to return in 1 week.

## 2012-11-06 ENCOUNTER — Ambulatory Visit (INDEPENDENT_AMBULATORY_CARE_PROVIDER_SITE_OTHER): Payer: Medicare Other

## 2012-11-06 VITALS — BP 128/78 | Wt 123.0 lb

## 2012-11-06 DIAGNOSIS — J301 Allergic rhinitis due to pollen: Secondary | ICD-10-CM

## 2012-11-06 DIAGNOSIS — J309 Allergic rhinitis, unspecified: Secondary | ICD-10-CM

## 2012-11-07 NOTE — Progress Notes (Signed)
Patient in for weekly allergy injection.  Injection given SUB Q in left arm.  No sign or symptom of adverse reaction.  No voiced complaints.  Patient aware that she should return in 7 days for next injection.  Patient also given order form to reorder med from [facility].

## 2012-11-19 ENCOUNTER — Ambulatory Visit (INDEPENDENT_AMBULATORY_CARE_PROVIDER_SITE_OTHER): Payer: Medicare Other

## 2012-11-19 VITALS — BP 110/70 | Wt 122.0 lb

## 2012-11-19 DIAGNOSIS — J302 Other seasonal allergic rhinitis: Secondary | ICD-10-CM

## 2012-11-19 DIAGNOSIS — J309 Allergic rhinitis, unspecified: Secondary | ICD-10-CM

## 2012-11-19 NOTE — Progress Notes (Signed)
Injection given with no complications 

## 2012-11-20 ENCOUNTER — Ambulatory Visit (INDEPENDENT_AMBULATORY_CARE_PROVIDER_SITE_OTHER): Payer: Medicare Other

## 2012-11-20 DIAGNOSIS — J309 Allergic rhinitis, unspecified: Secondary | ICD-10-CM | POA: Diagnosis not present

## 2012-11-26 ENCOUNTER — Ambulatory Visit (INDEPENDENT_AMBULATORY_CARE_PROVIDER_SITE_OTHER): Payer: Medicare Other

## 2012-11-26 VITALS — BP 120/70 | Wt 123.0 lb

## 2012-11-26 DIAGNOSIS — J309 Allergic rhinitis, unspecified: Secondary | ICD-10-CM

## 2012-11-26 NOTE — Progress Notes (Signed)
Patient in for weekly maintenance allergy injection.  Injection given in left arm.  No sign or symptom of reaction.  No voiced complaints.  Patient aware that she should return in 1 week.

## 2012-11-28 ENCOUNTER — Telehealth: Payer: Self-pay | Admitting: Family Medicine

## 2012-11-28 NOTE — Telephone Encounter (Signed)
Patient said that she had a sharp pain- thinks it was from the fried fish she had, then she passed akit if gas. Advised pt since she was not having any pain anymore that it was probably pain from the gas and to call back if she feels it again

## 2012-12-03 ENCOUNTER — Ambulatory Visit (INDEPENDENT_AMBULATORY_CARE_PROVIDER_SITE_OTHER): Payer: Medicare Other

## 2012-12-03 VITALS — BP 118/70 | Wt 120.0 lb

## 2012-12-03 DIAGNOSIS — J301 Allergic rhinitis due to pollen: Secondary | ICD-10-CM

## 2012-12-05 NOTE — Progress Notes (Signed)
Patient in to get weekly injection for her allergies. Given in right arm with no complications.

## 2012-12-10 ENCOUNTER — Ambulatory Visit (INDEPENDENT_AMBULATORY_CARE_PROVIDER_SITE_OTHER): Payer: Medicare Other | Admitting: Psychology

## 2012-12-10 ENCOUNTER — Ambulatory Visit (INDEPENDENT_AMBULATORY_CARE_PROVIDER_SITE_OTHER): Payer: Medicare Other

## 2012-12-10 VITALS — BP 124/78 | HR 88 | Resp 18 | Wt 120.0 lb

## 2012-12-10 DIAGNOSIS — J301 Allergic rhinitis due to pollen: Secondary | ICD-10-CM

## 2012-12-10 DIAGNOSIS — J309 Allergic rhinitis, unspecified: Secondary | ICD-10-CM

## 2012-12-10 DIAGNOSIS — R413 Other amnesia: Secondary | ICD-10-CM

## 2012-12-15 ENCOUNTER — Encounter (HOSPITAL_COMMUNITY): Payer: Self-pay | Admitting: Psychology

## 2012-12-15 ENCOUNTER — Ambulatory Visit (HOSPITAL_COMMUNITY): Payer: Self-pay | Admitting: Psychology

## 2012-12-15 NOTE — Progress Notes (Signed)
The patient was assessed on 05/27/2012 and today we conducted repeat assessment utilizing the same tools to make direct comparisons for possible changes over time.  The patient was administered a neuropsychological test battery consisting of the Wechsler Adult Intelligence Scale-III and the Wechsler Memory Scale-III.  Behavioral observations suggest that the patient was well motivated and focused on this testing and this does appear to be a valid assessment and the patient did appear to try her hardest.  While the patient was very anxious about doing as well as she  could she adapted to the testing situation fairly well and this does appear to be a fair and valid sample of her current functioning.  GENERAL INTELLECTUAL FUNCTIONING:  The patient's performance on the Weschler Adult Intellectual Scale-III, classifies her current intellectual abilities to be in the  average Range. Below or at the score she produced on the current testing as well as those for comparison from 6 months ago.    05/27/2012   12/12/2012  Verbal IQ:   90    92  Performance IQ: 98    87  Full Scale IQ:  93    90  Overall, WAIS-III performance suggest that the patient's VIQ vs. PIQ were  generally consistent with one another although her performance measures looking at visual spatial and information processing speed were somewhat higher than her verbal skills.    Below are the Age-Adjusted scores for each of the individual WAIS-III subtests from both today's assessment as well as 6 months ago.       05/27/2012   12/12/2012  Vocabulary:    9    9 Similarities:    10    9  Arithmetic:    8    7 Digit Span:    10    14 Information:    6    7 Comprehension:   7    7 Letter Number Sequencing:  9    8  Picture Completion:   8    12 Digit Symbol:    7    6 Block Design:    10    7 Matrix Reasoning:   9    6 Picture Arrangement:   15    10 Symbol Search:   11    10  Overall, WAIS-III performance was in the  average  range.  There were some variation between the initial testing 6 months ago in today. Of particular note, the patient did much better with regard to picture completion but not as good on picture arrangement. The patient also showed significant improvement on this testing with regard to her auditory encoding abilities. They were quite good last time but she was excellent today. The patient did show some mild to moderate declines with regard to her visual reasoning abilities but overall everything was fairly consistent with the exception of some sign mild slowing of her general information processing speed and speed of mental operations.  ATTENTION/CONCENTRATION AND MEMORY:  Below patient's performance on the Wechsler Memory Scale from both 2013 as well as the current assessment.         Predicted based on FSIQ:  Actual Score           05/27/2012  12/12/2012  Working Memory:    95    99   99 Auditory Immediate:    96    71   74 Auditory Delayed:    96    71   71 Auditory Recognition Delayed:  97    70   90  The current assessment is significant for his consistency with the initial assessment and no indications of any further cognitive decline. Her working memory/encoding abilities was quite good both with the initial assessment as well as the current assessment. She continued to show some significant problems with free recall of learning information as her auditory immediate and auditory delayed free recall measures were indicative of mild to moderate impairments. However, the current assessment she did quite good on the recognition task. This is significantly above her recognition memory from the initial assessment. This strongly indicates that she is learning new information but just having trouble pulling it out and recall. This improved performance is likely related to her increased comfort level during the current assessment and not being intimidated after doing poorly on the free recall measures  are doing quite well when they were set down into a qued recall format.a     CONCLUSIONS:    The overall results of these formal and objective measures of cognitive performance, along with direct comparisons 2 performance is from 6 months ago suggests that while she is showing some mild to moderate free recall memory impairments there is no indication of any further decline in her auditory recognition/Qued recall was white and significantly better than her initial assessment. There was some slowing and information processing speed relative to the initial assessment as well as some mild decreases in her verbal reasoning and problem-solving abilities. However, the overall results of this current assessment suggest a rather stable cognitive picture and are not indicative or consistent with cognitive declines associated with progressive dementia such as Alzheimer's.

## 2012-12-18 ENCOUNTER — Ambulatory Visit (INDEPENDENT_AMBULATORY_CARE_PROVIDER_SITE_OTHER): Payer: Medicare Other

## 2012-12-18 VITALS — BP 110/78 | Wt 123.0 lb

## 2012-12-18 DIAGNOSIS — J301 Allergic rhinitis due to pollen: Secondary | ICD-10-CM | POA: Diagnosis not present

## 2012-12-18 NOTE — Progress Notes (Signed)
Patient received injection without complication in right arm

## 2012-12-22 ENCOUNTER — Other Ambulatory Visit: Payer: Self-pay

## 2012-12-22 MED ORDER — CLORAZEPATE DIPOTASSIUM 7.5 MG PO TABS: ORAL_TABLET | ORAL | Status: DC

## 2012-12-23 ENCOUNTER — Telehealth: Payer: Self-pay | Admitting: Family Medicine

## 2012-12-23 NOTE — Telephone Encounter (Signed)
Med refilled 6/16

## 2012-12-24 ENCOUNTER — Ambulatory Visit (INDEPENDENT_AMBULATORY_CARE_PROVIDER_SITE_OTHER): Payer: Medicare Other

## 2012-12-24 VITALS — BP 118/70 | Wt 121.1 lb

## 2012-12-24 DIAGNOSIS — J301 Allergic rhinitis due to pollen: Secondary | ICD-10-CM | POA: Diagnosis not present

## 2012-12-24 NOTE — Progress Notes (Signed)
Received injection with no complications in left arm

## 2012-12-26 ENCOUNTER — Ambulatory Visit (HOSPITAL_COMMUNITY): Payer: Self-pay | Admitting: Psychology

## 2012-12-27 ENCOUNTER — Emergency Department (HOSPITAL_COMMUNITY)
Admission: EM | Admit: 2012-12-27 | Discharge: 2012-12-27 | Disposition: A | Discharge status: Home / Self Care | Payer: Medicare Other | Attending: Emergency Medicine | Admitting: Emergency Medicine

## 2012-12-27 ENCOUNTER — Encounter (HOSPITAL_COMMUNITY): Payer: Self-pay | Admitting: *Deleted

## 2012-12-27 ENCOUNTER — Emergency Department (HOSPITAL_COMMUNITY): Payer: Medicare Other

## 2012-12-27 DIAGNOSIS — M810 Age-related osteoporosis without current pathological fracture: Secondary | ICD-10-CM | POA: Diagnosis not present

## 2012-12-27 DIAGNOSIS — J45909 Unspecified asthma, uncomplicated: Secondary | ICD-10-CM | POA: Insufficient documentation

## 2012-12-27 DIAGNOSIS — G8929 Other chronic pain: Secondary | ICD-10-CM | POA: Insufficient documentation

## 2012-12-27 DIAGNOSIS — R42 Dizziness and giddiness: Secondary | ICD-10-CM | POA: Diagnosis not present

## 2012-12-27 DIAGNOSIS — G4700 Insomnia, unspecified: Secondary | ICD-10-CM | POA: Insufficient documentation

## 2012-12-27 DIAGNOSIS — Z8709 Personal history of other diseases of the respiratory system: Secondary | ICD-10-CM | POA: Insufficient documentation

## 2012-12-27 DIAGNOSIS — Z8719 Personal history of other diseases of the digestive system: Secondary | ICD-10-CM | POA: Insufficient documentation

## 2012-12-27 DIAGNOSIS — IMO0002 Reserved for concepts with insufficient information to code with codable children: Secondary | ICD-10-CM | POA: Diagnosis not present

## 2012-12-27 DIAGNOSIS — F411 Generalized anxiety disorder: Secondary | ICD-10-CM | POA: Diagnosis not present

## 2012-12-27 DIAGNOSIS — Z8679 Personal history of other diseases of the circulatory system: Secondary | ICD-10-CM | POA: Insufficient documentation

## 2012-12-27 DIAGNOSIS — R202 Paresthesia of skin: Secondary | ICD-10-CM

## 2012-12-27 DIAGNOSIS — M542 Cervicalgia: Secondary | ICD-10-CM | POA: Insufficient documentation

## 2012-12-27 DIAGNOSIS — M1990 Unspecified osteoarthritis, unspecified site: Secondary | ICD-10-CM | POA: Insufficient documentation

## 2012-12-27 DIAGNOSIS — Z862 Personal history of diseases of the blood and blood-forming organs and certain disorders involving the immune mechanism: Secondary | ICD-10-CM | POA: Insufficient documentation

## 2012-12-27 DIAGNOSIS — R209 Unspecified disturbances of skin sensation: Secondary | ICD-10-CM | POA: Diagnosis not present

## 2012-12-27 DIAGNOSIS — K219 Gastro-esophageal reflux disease without esophagitis: Secondary | ICD-10-CM | POA: Insufficient documentation

## 2012-12-27 DIAGNOSIS — I6350 Cerebral infarction due to unspecified occlusion or stenosis of unspecified cerebral artery: Secondary | ICD-10-CM | POA: Diagnosis not present

## 2012-12-27 DIAGNOSIS — Z8639 Personal history of other endocrine, nutritional and metabolic disease: Secondary | ICD-10-CM | POA: Insufficient documentation

## 2012-12-27 DIAGNOSIS — Z79899 Other long term (current) drug therapy: Secondary | ICD-10-CM | POA: Diagnosis not present

## 2012-12-27 DIAGNOSIS — Z8669 Personal history of other diseases of the nervous system and sense organs: Secondary | ICD-10-CM | POA: Insufficient documentation

## 2012-12-27 LAB — BASIC METABOLIC PANEL
BUN: 14 mg/dL (ref 6–23)
Calcium: 9.3 mg/dL (ref 8.4–10.5)
Chloride: 103 mEq/L (ref 96–112)
Creatinine, Ser: 0.85 mg/dL (ref 0.50–1.10)
GFR calc Af Amer: 70 mL/min — ABNORMAL LOW (ref >90)
GFR calc non Af Amer: 61 mL/min — ABNORMAL LOW (ref >90)

## 2012-12-27 LAB — CBC
HCT: 35.5 % — ABNORMAL LOW (ref 36.0–46.0)
MCH: 34.1 pg — ABNORMAL HIGH (ref 26.0–34.0)
MCHC: 34.1 g/dL (ref 30.0–36.0)
MCV: 100 fL (ref 78.0–100.0)
Platelets: 96 10*3/uL — ABNORMAL LOW (ref 150–400)
RDW: 12.4 % (ref 11.5–15.5)

## 2012-12-27 NOTE — ED Provider Notes (Signed)
History    This chart was scribed for Lyanne Co, MD, by Frederik Pear, ED scribe. The patient was seen in room APA07/APA07 and the patient's care was started at 0900.    CSN: [id number]  Arrival date & time 12/27/12  3086   First MD Initiated Contact with Patient 12/27/12 0900      Chief Complaint  Patient presents with  . Numbness   The history is provided by the patient and medical records. No language interpreter was used.  HPI Comments: Kathleen Gallegos is a 79 y.o. Right-handed female with a h/o of hyperlipidemia who presents to the Emergency Department complaining of sudden onset left hand numbness that is worse over the second and third digit that radiates up her arm that awoke her from sleep when it began this morning. Her daughter reports that she has been complaining of tingling in the right arm and intermittent dizziness for 2 weeks. She denies facial drooping, weakness, and CP. In the ED, she denies neck pain, but complains of an aching sensation in her right neck. She states that the tingling sensation is not currently present. She denies a h/o of hypertension, DM, MIs, stents, or CVAs. She is a current, every day nonsmoker. She is followed by Dr. Fredderick Erb who placed her on a higher dose of aspirin, which he later discontinued. Her carotid artery was checked in 06/13, which was normal. She is also followed by Dr. Mariel Sleet for pancytopenia and Dr. Nile Riggs for cataracts. She was also seen by a chiropractor 1 year ago for intermittent T-spine pain.   Past Medical History  Diagnosis Date  . IBS (irritable bowel syndrome)   . Anxiety disorder   . Hyperlipidemia   . Lung nodule   . Angioneurotic edema not elsewhere classified   . Allergic rhinitis, cause unspecified   . Asthma   . Carpal tunnel syndrome   . Chronic abdominal pain   . GERD (gastroesophageal reflux disease)   . Insomnia   . Palpitations   . Osteoarthritis   . Senile osteoporosis   . Fecal incontinence    . Carotid bruit   . Pancytopenia     Past Surgical History  Procedure Laterality Date  . Abdominal hysterectomy  1979    fibroids   . Colonoscopy  12/21/04    normal -redundant   . Biopsy thyroid  1970   . Bilateral cataract surgery  2008  . Eus  08/07/07    Family History  Problem Relation Age of Onset  . Hypertension Mother   . Pancreatic cancer Father   . Cancer Brother   . Leukemia      family history   . Colon cancer      family history     History  Substance Use Topics  . Smoking status: Never Smoker   . Smokeless tobacco: Never Used  . Alcohol Use: No    OB History   Grav Para Term Preterm Abortions TAB SAB Ect Mult Living                  Review of Systems A complete 10 system review of systems was obtained and all systems are negative except as noted in the HPI and PMH.  Allergies  Diphenhydramine hcl and Rivastigmine tartrate  Home Medications   Current Outpatient Rx  Name  Route  Sig  Dispense  Refill  . ADVAIR DISKUS 250-50 MCG/DOSE AEPB      INHALE 1 PUFF TWICE DAILY. RINSE AFTER  USE   60 each   1   . busPIRone (BUSPAR) 5 MG tablet      TAKE ONE TABLET BY MOUTH TWICE DAILY   60 tablet   4   . Calcium Citrate-Vitamin D (CITRACAL + D PO)   Oral   Take 1 tablet by mouth. 250iu of Vitamin D and 200mg  of Calcium daily         . Carboxymeth-Glycerin-Polysorb (REFRESH OPTIVE ADVANCED OP)   Ophthalmic   Apply to eye. 1-2 droops in each eye as needed/directed          . clorazepate (TRANXENE) 7.5 MG tablet      Take 1/2 tab in the am and 1 tab at bedtime   45 tablet   3   . docusate sodium (COLACE) 100 MG capsule   Oral   Take 100 mg by mouth as needed.          Marland Kitchen EPINEPHrine (EPIPEN) 0.3 mg/0.3 mL DEVI   Intramuscular   Inject 0.3 mg into the muscle once. For severe allergic reaction          . fish oil-omega-3 fatty acids 1000 MG capsule   Oral   Take 690 mg by mouth daily.          . Lactobacillus Rhamnosus, GG,  (CULTURELLE PO)   Oral   Take 1 tablet by mouth daily.         . metoprolol succinate (TOPROL-XL) 25 MG 24 hr tablet      TAKE ONE HALF TABLET BY MOUTH TWICE DAILY   30 tablet   4   . mometasone (NASONEX) 50 MCG/ACT nasal spray   Nasal   Place 2 sprays into the nose daily.         Marland Kitchen omeprazole (PRILOSEC OTC) 20 MG tablet   Oral   Take 20 mg by mouth daily.         . polyethylene glycol powder (MIRALAX) powder   Oral   Take 17 g by mouth daily. Take half dose daily as needed              BP 144/88  Temp(Src) 98.5 F (36.9 C) (Oral)  Resp 16  Ht 5\' 6"  (1.676 m)  Wt 123 lb (55.792 kg)  BMI 19.86 kg/m2  SpO2 97%  Physical Exam  Nursing note and vitals reviewed. Constitutional: She is oriented to person, place, and time. She appears well-developed and well-nourished. No distress.  HENT:  Head: Normocephalic and atraumatic.  Eyes: EOM are normal.  Neck: Normal range of motion.  No c-spine tenderness.  Cardiovascular: Normal rate, regular rhythm and normal heart sounds.   Normal DP and PT pulses in bilateral feet.  Pulmonary/Chest: Effort normal and breath sounds normal.  Abdominal: Soft. She exhibits no distension. There is no tenderness.  Musculoskeletal: Normal range of motion.  Neurological: She is alert and oriented to person, place, and time.  5/5 bilateral grip strength in upper and lower extremity major muscle groups.  Skin: Skin is warm and dry.  Psychiatric: She has a normal mood and affect. Judgment normal.    ED Course  Procedures (including critical care time)   Date: 12/27/2012  Rate: 62  Rhythm: normal sinus rhythm  QRS Axis: normal  Intervals: normal  ST/T Wave abnormalities: normal  Conduction Disutrbances: none  Narrative Interpretation:   Old EKG Reviewed: No significant changes noted  DIAGNOSTIC STUDIES: Oxygen Saturation is 97% on room air, normal by my interpretation.  COORDINATION OF CARE:  09:30- Discussed planned  course of treatment with the patient, including a head CT, CBC, and basic metabolic panel, who is agreeable at this time.  Labs Reviewed  CBC - Abnormal; Notable for the following:    WBC 2.6 (*)    RBC 3.55 (*)    HCT 35.5 (*)    MCH 34.1 (*)    Platelets 96 (*)    All other components within normal limits  BASIC METABOLIC PANEL - Abnormal; Notable for the following:    GFR calc non Af Amer 61 (*)    GFR calc Af Amer 70 (*)    All other components within normal limits   Ct Head Wo Contrast  12/27/2012   *RADIOLOGY REPORT*  Clinical Data: Numbness and left arm  CT HEAD WITHOUT CONTRAST  Technique:  Contiguous axial images were obtained from the base of the skull through the vertex without contrast.  Comparison: None  Findings: There is diffuse patchy low density throughout the subcortical and periventricular white matter consistent with chronic small vessel ischemic change.  Chronic infarct within the left cerebellar hemisphere is identified, image 7/series 2.  There is prominence of the sulci and ventricles consistent with brain atrophy.  There is no evidence for acute brain infarct, hemorrhage or mass.  The paranasal sinuses and the mastoid air cells are clear.  The skull appears intact.  IMPRESSION:  1.  No acute intracranial abnormalities. 2.  Small vessel ischemic change and brain atrophy. 3.  Chronic left cerebellar hemisphere infarct.   Original Report Authenticated By: Signa Kell, M.D.   Results for orders placed during the hospital encounter of 12/27/12  CBC      Result Value Range   WBC 2.6 (*) 4.0 - 10.5 K/uL   RBC 3.55 (*) 3.87 - 5.11 MIL/uL   Hemoglobin 12.1  12.0 - 15.0 g/dL   HCT 40.9 (*) 81.1 - 91.4 %   MCV 100.0  78.0 - 100.0 fL   MCH 34.1 (*) 26.0 - 34.0 pg   MCHC 34.1  30.0 - 36.0 g/dL   RDW 78.2  95.6 - 21.3 %   Platelets 96 (*) 150 - 400 K/uL  BASIC METABOLIC PANEL      Result Value Range   Sodium 141  135 - 145 mEq/L   Potassium 4.0  3.5 - 5.1 mEq/L    Chloride 103  96 - 112 mEq/L   CO2 30  19 - 32 mEq/L   Glucose, Bld 86  70 - 99 mg/dL   BUN 14  6 - 23 mg/dL   Creatinine, Ser 0.86  0.50 - 1.10 mg/dL   Calcium 9.3  8.4 - 57.8 mg/dL   GFR calc non Af Amer 61 (*) >90 mL/min   GFR calc Af Amer 70 (*) >90 mL/min   1. Paresthesias in left hand       MDM  The patient is overall well-appearing.  She has 5 out of 5 strength in her bilateral upper lower extremity major muscle groups.  CT head is normal.  My suspicion for stroke is low.  She will be asked to take 81 mg aspirin daily for stroke prophylaxis.  Carotid duplexes done in 2013 demonstrate no evidence of carotid disease.  Some this may more likely represent left-sided carpal tunnel syndrome with numbness and tingling in her left hand with radiation up her left arm.  No C-spine tenderness.  Nothing to suggest radicular symptoms.  Normal pulses in bilateral feet.  Discharge home in good condition.  Outpatient neurology and PCP followup.   I personally performed the services described in this documentation, which was scribed in my presence. The recorded information has been reviewed and is accurate.          Lyanne Co, MD 12/27/12 1019

## 2012-12-27 NOTE — ED Notes (Signed)
Reports left leg tingling intermittent x 2 wks ago - left arm numbness/tingling x 2 days, worse this morning upon waking.  Denies weakness/slurred speech.  Denies pain.

## 2012-12-27 NOTE — ED Notes (Signed)
C/O numbness in L arm and L leg.  No c/o pain.  Strength equal bilaterally upper and lower extremeties.  Has seen chiropractor in past for neck/shoulder discomfort.  No other prior c/o spinal pain.

## 2012-12-29 ENCOUNTER — Ambulatory Visit (INDEPENDENT_AMBULATORY_CARE_PROVIDER_SITE_OTHER): Payer: Medicare Other | Admitting: Psychology

## 2012-12-29 DIAGNOSIS — F451 Undifferentiated somatoform disorder: Secondary | ICD-10-CM

## 2012-12-29 DIAGNOSIS — R413 Other amnesia: Secondary | ICD-10-CM | POA: Diagnosis not present

## 2012-12-29 DIAGNOSIS — F4322 Adjustment disorder with anxiety: Secondary | ICD-10-CM

## 2012-12-29 DIAGNOSIS — F459 Somatoform disorder, unspecified: Secondary | ICD-10-CM

## 2013-01-05 ENCOUNTER — Ambulatory Visit (INDEPENDENT_AMBULATORY_CARE_PROVIDER_SITE_OTHER): Payer: Medicare Other

## 2013-01-05 VITALS — BP 118/78 | Wt 125.0 lb

## 2013-01-05 DIAGNOSIS — J301 Allergic rhinitis due to pollen: Secondary | ICD-10-CM

## 2013-01-05 DIAGNOSIS — J309 Allergic rhinitis, unspecified: Secondary | ICD-10-CM

## 2013-01-14 ENCOUNTER — Ambulatory Visit: Payer: Medicare Other

## 2013-01-14 DIAGNOSIS — Z79899 Other long term (current) drug therapy: Secondary | ICD-10-CM | POA: Diagnosis not present

## 2013-01-14 DIAGNOSIS — R5381 Other malaise: Secondary | ICD-10-CM | POA: Diagnosis not present

## 2013-01-14 DIAGNOSIS — R5383 Other fatigue: Secondary | ICD-10-CM | POA: Diagnosis not present

## 2013-01-14 LAB — CBC WITH DIFFERENTIAL/PLATELET
Eosinophils Relative: 2 % (ref 0–5)
HCT: 36.4 % (ref 36.0–46.0)
Lymphocytes Relative: 48 % — ABNORMAL HIGH (ref 12–46)
Lymphs Abs: 1.6 10*3/uL (ref 0.7–4.0)
MCH: 33 pg (ref 26.0–34.0)
MCV: 97.6 fL (ref 78.0–100.0)
Monocytes Absolute: 0.4 10*3/uL (ref 0.1–1.0)
RBC: 3.73 MIL/uL — ABNORMAL LOW (ref 3.87–5.11)
WBC: 3.3 10*3/uL — ABNORMAL LOW (ref 4.0–10.5)

## 2013-01-15 LAB — BASIC METABOLIC PANEL
BUN: 16 mg/dL (ref 6–23)
CO2: 31 mEq/L (ref 19–32)
Chloride: 102 mEq/L (ref 96–112)
Potassium: 4.3 mEq/L (ref 3.5–5.3)

## 2013-01-15 LAB — LIPID PANEL
Cholesterol: 239 mg/dL — ABNORMAL HIGH (ref 0–200)
LDL Cholesterol: 139 mg/dL — ABNORMAL HIGH (ref 0–99)
VLDL: 7 mg/dL (ref 0–40)

## 2013-01-19 ENCOUNTER — Ambulatory Visit (HOSPITAL_COMMUNITY): Payer: Self-pay | Admitting: Psychology

## 2013-01-21 ENCOUNTER — Encounter: Payer: Self-pay | Admitting: Family Medicine

## 2013-01-21 ENCOUNTER — Ambulatory Visit (INDEPENDENT_AMBULATORY_CARE_PROVIDER_SITE_OTHER): Payer: Medicare Other | Admitting: Family Medicine

## 2013-01-21 VITALS — BP 118/74 | HR 69 | Resp 16 | Ht 66.0 in | Wt 121.1 lb

## 2013-01-21 DIAGNOSIS — Z0000 Encounter for general adult medical examination without abnormal findings: Secondary | ICD-10-CM | POA: Insufficient documentation

## 2013-01-21 MED ORDER — METOPROLOL SUCCINATE ER 25 MG PO TB24: ORAL_TABLET | ORAL | Status: DC

## 2013-01-21 NOTE — Progress Notes (Signed)
Subjective:    Patient ID: Kathleen Gallegos, female    DOB: December 20, 1925, 79 y.o.   MRN: [id number]  HPI  Preventive Screening-Counseling & Management   Patient present here today for a Medicare annual wellness visit.   Current Problems (verified)   Medications Prior to Visit Allergies (verified)   PAST HISTORY  Family History  Social History : widow , lives with one of her 3 daughters, no nicotine, alcohol, drug use   Risk Factors  Current exercise habits:  Walks regularly, encouraged to continue same Dietary issues discussed:diet rich in fresh or frozen vegetable and fruit and low in fats   Cardiac risk factors:   Depression Screen  (Note: if answer to either of the following is "Yes", a more complete depression screening is indicated)   Over the past two weeks, have you felt down, depressed or hopeless? No  Over the past two weeks, have you felt little interest or pleasure in doing things? No  Have you lost interest or pleasure in daily life? No  Do you often feel hopeless? No  Do you cry easily over simple problems? No   Activities of Daily Living  In your present state of health, do you have any difficulty performing the following activities?  Driving?: limitation distance and daylight hours, advised to let her daughter  Managing money?: yes, daughter helps  Feeding yourself?:No Getting from bed to chair?:No Climbing a flight of stairs?:No Preparing food and eating?:needs to be limited in access to stove Bathing or showering?:No Getting dressed?:No Getting to the toilet?:No Using the toilet?:No Moving around from place to place?: No  Fall Risk Assessment In the past year have you fallen or had a near fall?:No Are you currently taking any medications that make you dizziness?:No   Hearing Difficulties: No Do you often ask people to speak up or repeat themselves?:No Do you experience ringing or noises in your ears?:No Do you have difficulty understanding  soft or whispered voices?:No  Cognitive Testing  Alert? Yes Normal Appearance?Yes  Oriented to person? Yes Place? Yes  Time? Yes  Displays appropriate judgment?Yes  Can read the correct time from a watch face? yes Are you having problems remembering things?short term memory loss   Advanced Directives have been discussed with the patient?Yes, full code    List the Names of Other Physician/Practitioners you currently use: Hematology, Dr Shelva Majestic   Indicate any recent Medical Services you may have received from other than [facility] providers in the past year (date may be approximate).   Assessment:    Annual Wellness Exam   Plan:    During the course of the visit the patient was educated and counseled about appropriate screening and preventive services including:  A healthy diet is rich in fruit, vegetables and whole grains. Poultry fish, nuts and beans are a healthy choice for protein rather then red meat. A low sodium diet and drinking 64 ounces of water daily is generally recommended. Oils and sweet should be limited. Carbohydrates especially for those who are diabetic or overweight, should be limited to 30-45 gram per meal. It is important to eat on a regular schedule, at least 3 times daily. Snacks should be primarily fruits, vegetables or nuts. It is important that you exercise regularly at least 30 minutes 5 times a week. If you develop chest pain, have severe difficulty breathing, or feel very tired, stop exercising immediately and seek medical attention  Immunization reviewed and updated. Cancer screening reviewed and updated    Patient  Instructions (the written plan) was given to the patient.  Medicare Attestation  I have personally reviewed:  The patient's medical and social history  Their use of alcohol, tobacco or illicit drugs  Their current medications and supplements  The patient's functional ability including ADLs,fall risks, home safety risks, cognitive, and hearing  and visual impairment  Diet and physical activities  Evidence for depression or mood disorders  The patient's weight, height, BMI, and visual acuity have been recorded in the chart. I have made referrals, counseling, and provided education to the patient based on review of the above and I have provided the patient with a written personalized care plan for preventive services.   Hematology,  Review of Systems     Objective:   Physical Exam        Assessment & Plan:

## 2013-01-21 NOTE — Patient Instructions (Addendum)
F/u in 4 month, call if you need me before  We will contact you with help as far as your living will is concerned, I think it best for at least 2 preferably your 3 daughrers to be present  You are very functional , SHORT TERM MEMORY needs to be helped as much as possible. Constancy in where you keep things. Writing things down Also do  not plan to cook when no one else is home with you, and increasingly depend on help with driving  Labs are excellent  Check to see if shingles vaccine is being covered, with your pharmacy, you still need this pls

## 2013-01-23 ENCOUNTER — Encounter (HOSPITAL_COMMUNITY): Payer: Self-pay | Admitting: Oncology

## 2013-01-23 ENCOUNTER — Encounter (HOSPITAL_COMMUNITY): Payer: Medicare Other | Attending: Oncology | Admitting: Oncology

## 2013-01-23 VITALS — BP 100/63 | HR 65 | Temp 98.2°F | Resp 16 | Wt 122.2 lb

## 2013-01-23 DIAGNOSIS — D61818 Other pancytopenia: Secondary | ICD-10-CM

## 2013-01-23 NOTE — Patient Instructions (Addendum)
[facility] Discharge Instructions  RECOMMENDATIONS MADE BY THE CONSULTANT AND ANY TEST RESULTS WILL BE SENT TO YOUR REFERRING PHYSICIAN.  EXAM FINDINGS BY THE PHYSICIAN TODAY AND SIGNS OR SYMPTOMS TO REPORT TO CLINIC OR PRIMARY PHYSICIAN:   6 months labs and 6 months to see MD  Good exam!  Thank you for choosing [facility] to provide your oncology and hematology care.  To afford each patient quality time with our providers, please arrive at least 15 minutes before your scheduled appointment time.  With your help, our goal is to use those 15 minutes to complete the necessary work-up to ensure our physicians have the information they need to help with your evaluation and healthcare recommendations.    Effective January 1st, 2014, we ask that you re-schedule your appointment with our physicians should you arrive 10 or more minutes late for your appointment.  We strive to give you quality time with our providers, and arriving late affects you and other patients whose appointments are after yours.    Again, thank you for choosing [facility].  Our hope is that these requests will decrease the amount of time that you wait before being seen by our physicians.       _____________________________________________________________  Should you have questions after your visit to [facility], please contact our office at [phone] between the hours of 8:30 a.m. and 5:00 p.m.  Voicemails left after 4:30 p.m. will not be returned until the following business day.  For prescription refill requests, have your pharmacy contact our office with your prescription refill request.

## 2013-01-23 NOTE — Progress Notes (Signed)
Kathleen Overman, MD [address] [place] [address]  Pancytopenia  CURRENT THERAPY: Observation  INTERVAL HISTORY: Kathleen Gallegos 79 y.o. female returns for  regular  visit for followup of intermittent pancytopenia dating back to at least 2008 without any changes.    I personally reviewed and went over laboratory results with the patient.  Patient's laboratory work from 01/14/2013 the white blood cell count of 3.3, hemoglobin 12.3 g/dL and platelet count 604,540. These labs are very stable dating back to at least 2008 without any significant changes.  Hematologically, the patient denies any complaints of ROS questioning is negative.  She denies the requirement for any antibiotics over the past 6 months for infections.  I provided patient education regarding the relationship of diet and her laboratory work. There is a very low relationship regarding this and therefore a change in her diet would not help her laboratory work particularly in light of her normal hemoglobin. She was educated that diet has not affected platelets normal white blood cells. She was educated on the role of white blood cells, hemoglobin, and platelets.  She certainly is not a good candidate for any intervention at this time and do her labs being very stable for a number of years we will continue to observe. At this time there is no indication for bone marrow aspiration and biopsy particularly since she is not a candidate for any intervention due to her age and comorbidities.  We'll perform laboratory work in 6 months and see her back in 6 months. She is educated that her labs are very stable.  Past Medical History  Diagnosis Date  . IBS (irritable bowel syndrome)   . Anxiety disorder   . Hyperlipidemia   . Lung nodule   . Angioneurotic edema not elsewhere classified   . Allergic rhinitis, cause unspecified   . Asthma   . Carpal tunnel syndrome   . Chronic abdominal pain   . GERD  (gastroesophageal reflux disease)   . Insomnia   . Palpitations   . Osteoarthritis   . Senile osteoporosis   . Fecal incontinence   . Carotid bruit   . Pancytopenia     has TINEA PEDIS; HYPERLIPIDEMIA; Presenile dementia, uncomplicated; GENERALIZED ANXIETY DISORDER; CARPAL TUNNEL SYNDROME; UNSPECIFIED PERIPHERAL VERTIGO; TRANSIENT ISCHEMIC ATTACK; HEMORRHOIDS, WITH BLEEDING; Allergic rhinitis due to pollen; Allergic-infective asthma; LUNG NODULE; GERD; IRRITABLE BOWEL SYNDROME; CONTACT DERMATITIS&OTHER ECZEMA DUE UNSPEC CAUSE; OSTEOARTHRITIS; HIP PAIN, RIGHT; SENILE OSTEOPOROSIS; INSOMNIA; FATIGUE; PALPITATIONS; CHEST PAIN UNSPECIFIED; FECAL INCONTINENCE; ANGIOEDEMA; COUGH; Pancytopenia; Seasonal allergies; and Routine general medical examination at a health care facility on her problem list.     is allergic to diphenhydramine hcl and rivastigmine tartrate.  Kathleen Gallegos does not currently have medications on file.  Past Surgical History  Procedure Laterality Date  . Abdominal hysterectomy  1979    fibroids   . Colonoscopy  12/21/04    normal -redundant   . Biopsy thyroid  1970   . Bilateral cataract surgery  2008  . Eus  08/07/07    Denies any headaches, dizziness, double vision, fevers, chills, night sweats, nausea, vomiting, diarrhea, constipation, chest pain, heart palpitations, shortness of breath, blood in stool, black tarry stool, urinary pain, urinary burning, urinary frequency, hematuria.   PHYSICAL EXAMINATION  ECOG PERFORMANCE STATUS: 1 - Symptomatic but completely ambulatory  Filed Vitals:   01/23/13 1300  BP: 100/63  Pulse: 65  Temp: 98.2 F (36.8 C)  Resp: 16  GENERAL:alert, no distress, well nourished, well developed, comfortable, cooperative and smiling SKIN: skin color, texture, turgor are normal, no rashes or significant lesions HEAD: Normocephalic, No masses, lesions, tenderness or abnormalities EYES: normal, PERRLA, EOMI, Conjunctiva are pink and  non-injected EARS: External ears normal OROPHARYNX:mucous membranes are moist  NECK: supple, no adenopathy, thyroid normal size, non-tender, without nodularity, no stridor, non-tender, trachea midline LYMPH:  no palpable lymphadenopathy BREAST:not examined LUNGS: clear to auscultation  HEART: regular rate & rhythm, no murmurs, no gallops, S1 normal and S2 normal ABDOMEN:abdomen soft, non-tender and normal bowel sounds BACK: Back symmetric, no curvature. EXTREMITIES:less then 2 second capillary refill, no joint deformities, effusion, or inflammation, no edema, no skin discoloration, no clubbing, no cyanosis  NEURO: alert & oriented x 3 with fluent speech, no focal motor/sensory deficits, gait normal   LABORATORY DATA: CBC    Component Value Date/Time   WBC 3.3* 01/14/2013 0800   RBC 3.73* 01/14/2013 0800   HGB 12.3 01/14/2013 0800   HCT 36.4 01/14/2013 0800   PLT 121* 01/14/2013 0800   MCV 97.6 01/14/2013 0800   MCH 33.0 01/14/2013 0800   MCHC 33.8 01/14/2013 0800   RDW 13.1 01/14/2013 0800   LYMPHSABS 1.6 01/14/2013 0800   MONOABS 0.4 01/14/2013 0800   EOSABS 0.1 01/14/2013 0800   BASOSABS 0.0 01/14/2013 0800     RADIOGRAPHIC STUDIES:  12/27/2012  *RADIOLOGY REPORT*  Clinical Data: Numbness and left arm  CT HEAD WITHOUT CONTRAST  Technique: Contiguous axial images were obtained from the base of  the skull through the vertex without contrast.  Comparison: None  Findings: There is diffuse patchy low density throughout the  subcortical and periventricular white matter consistent with  chronic small vessel ischemic change.  Chronic infarct within the left cerebellar hemisphere is  identified, image 7/series 2.  There is prominence of the sulci and ventricles consistent with  brain atrophy.  There is no evidence for acute brain infarct, hemorrhage or mass.  The paranasal sinuses and the mastoid air cells are clear. The  skull appears intact.  IMPRESSION:  1. No acute intracranial  abnormalities.  2. Small vessel ischemic change and brain atrophy.  3. Chronic left cerebellar hemisphere infarct.  Original Report Authenticated By: Signa Kell, M.D.     ASSESSMENT:  1. Intermittent pancytopenia since at least 2008 thus far without change  2. Memory loss, suspect cerebral vascular disease  3. Chronic anxiety  4. Hypercholesterolemia  5. History of asthma  6. Chronic constipation  Patient Active Problem List   Diagnosis Date Noted  . Routine general medical examination at a health care facility 01/21/2013  . Seasonal allergies 07/27/2012  . Pancytopenia 12/27/2011  . COUGH 08/15/2010  . HIP PAIN, RIGHT 06/08/2010  . INSOMNIA 12/04/2009  . UNSPECIFIED PERIPHERAL VERTIGO 11/28/2009  . HEMORRHOIDS, WITH BLEEDING 07/27/2009  . TRANSIENT ISCHEMIC ATTACK 03/29/2009  . PALPITATIONS 03/25/2009  . Presenile dementia, uncomplicated 11/26/2008  . FATIGUE 11/26/2008  . CHEST PAIN UNSPECIFIED 11/26/2008  . IRRITABLE BOWEL SYNDROME 10/05/2008  . FECAL INCONTINENCE 10/05/2008  . CONTACT DERMATITIS&OTHER ECZEMA DUE UNSPEC CAUSE 09/23/2008  . SENILE OSTEOPOROSIS 07/25/2008  . TINEA PEDIS 05/24/2008  . GERD 05/24/2008  . HYPERLIPIDEMIA 11/10/2007  . GENERALIZED ANXIETY DISORDER 11/10/2007  . OSTEOARTHRITIS 11/10/2007  . Allergic rhinitis due to pollen 09/18/2007  . Allergic-infective asthma 09/18/2007  . LUNG NODULE 09/18/2007  . ANGIOEDEMA 09/18/2007  . CARPAL TUNNEL SYNDROME 07/21/2007      PLAN:  1. I personally reviewed and went  over laboratory results with the patient. 2. I personally reviewed and went over radiographic studies with the patient. 3. Next screening mammogram is due in Feb 2015 4. Labs in 6 months: CBC diff 5. Return in 6 months for follow-up   THERAPY PLAN:  Since the patient is a poor candidate for any intervention due to her pancytopenia, there is no need for a bone marrow aspiration and biopsy. Her laboratory work has been very stable  over the past few months and years. We'll continue to monitor her with regular laboratory see her back in 6 months. We'll perform laboratory work in 6 months as well.  Patient and plan discussed with Dr. Erline Hau and he is in agreement with the aforementioned.   Kathleen Gallegos

## 2013-01-25 NOTE — Assessment & Plan Note (Signed)
Annual wellness as documented Needs to work on keeping short term memory as good as it is. End of life issues preferably should be documented in the presence of at least 2 of her 3 daughters, help requested through available hospital services

## 2013-01-28 ENCOUNTER — Ambulatory Visit (INDEPENDENT_AMBULATORY_CARE_PROVIDER_SITE_OTHER): Payer: Medicare Other

## 2013-01-28 VITALS — BP 120/80 | Wt 121.1 lb

## 2013-01-28 DIAGNOSIS — J301 Allergic rhinitis due to pollen: Secondary | ICD-10-CM | POA: Diagnosis not present

## 2013-01-28 NOTE — Progress Notes (Signed)
Injection given in right arm with no complications.

## 2013-01-30 ENCOUNTER — Ambulatory Visit (INDEPENDENT_AMBULATORY_CARE_PROVIDER_SITE_OTHER): Payer: Medicare Other | Admitting: Psychology

## 2013-01-30 DIAGNOSIS — F451 Undifferentiated somatoform disorder: Secondary | ICD-10-CM

## 2013-01-30 DIAGNOSIS — F4322 Adjustment disorder with anxiety: Secondary | ICD-10-CM | POA: Diagnosis not present

## 2013-01-30 DIAGNOSIS — R413 Other amnesia: Secondary | ICD-10-CM

## 2013-01-30 DIAGNOSIS — F459 Somatoform disorder, unspecified: Secondary | ICD-10-CM

## 2013-02-02 ENCOUNTER — Encounter (HOSPITAL_COMMUNITY): Payer: Self-pay | Admitting: Psychology

## 2013-02-02 NOTE — Progress Notes (Signed)
Patient:  Kathleen Gallegos   DOB: 1925-09-02  MR Number: [id number]  Location: [facility] [address] [place] [address] Dept: [phone]  Start: 11 AM  End: 12 PM  Provider/Observer:     Hershal Coria PSYD  Chief Complaint:      Chief Complaint  Patient presents with  . Anxiety  . Memory Loss    Reason For Service:     The patient was referred for psychotherapeutic interventions do to increasing worry and anxiety as well as difficulty adjusting to life changes and aging issues.  Interventions Strategy:  Cognitive/behavioral psychotherapy  Participation Level:   Active  Participation Quality:  Appropriate      Behavioral Observation:  Well Groomed, Alert, and Appropriate.   Current Psychosocial Factors: The patient comes in today and we provided feedback to the patient and her daughter regarding the results of the testing as well as working with the patient and her daughter regarding the conflict between the 2. The patient reports that she has been doing better with her daughter but not sure if her daughter is doing better with her.   Content of Session:   Reviewed current symptoms and continue to work on therapeutic interventions with particular focus on her somatizations disorder.  Current Status:   The patient continues to show some mild memory problems but they are stable for the most part. The patient is not showing signs of Alzheimer's or other degenerative cortical deficits. The patient reports that she has been experiencing some anxiety but her somatoform symptoms have been much improved.   Patient Progress:   Very good  Target Goals:   Target goals include reducing the frequency, intensity, and duration of her hyper focus and hypervigilance on her somatic issues. 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  Last Reviewed:   12/29/2012   Goals Addressed Today:    Today we were primarily with a somatizations issues.  Impression/Diagnosis:   At this point, the patient does continue to show some significant adjustment difficulties and issues but this does not appear to be anything long-standing. There are no cognitive difficulties or indications of dementia. The patient is in fact relatively healthy. However, there is a continual hyper focus and hypervigilance over her Medical Center status and she is continuing to be very concerned about his issues particular her GI functioning.  Diagnosis:    Axis I:  Memory loss  Somatoform disorder  Adjustment disorder with anxiety      Axis II: No diagnosis

## 2013-02-04 ENCOUNTER — Ambulatory Visit (INDEPENDENT_AMBULATORY_CARE_PROVIDER_SITE_OTHER): Payer: Medicare Other

## 2013-02-04 VITALS — BP 120/70 | Wt 123.1 lb

## 2013-02-04 DIAGNOSIS — J301 Allergic rhinitis due to pollen: Secondary | ICD-10-CM | POA: Diagnosis not present

## 2013-02-04 NOTE — Progress Notes (Signed)
Weekly injection given with no complications

## 2013-02-12 ENCOUNTER — Ambulatory Visit (HOSPITAL_COMMUNITY)
Admission: RE | Admit: 2013-02-12 | Discharge: 2013-02-12 | Disposition: A | Discharge status: Home / Self Care | Payer: Medicare Other | Source: Ambulatory Visit | Attending: Family Medicine | Admitting: Family Medicine

## 2013-02-12 ENCOUNTER — Encounter: Payer: Self-pay | Admitting: Family Medicine

## 2013-02-12 ENCOUNTER — Ambulatory Visit (INDEPENDENT_AMBULATORY_CARE_PROVIDER_SITE_OTHER): Payer: Medicare Other | Admitting: Family Medicine

## 2013-02-12 VITALS — BP 134/68 | HR 68 | Resp 18 | Ht 66.0 in | Wt 122.1 lb

## 2013-02-12 DIAGNOSIS — M25472 Effusion, left ankle: Secondary | ICD-10-CM

## 2013-02-12 DIAGNOSIS — M7989 Other specified soft tissue disorders: Secondary | ICD-10-CM | POA: Diagnosis not present

## 2013-02-12 DIAGNOSIS — M7730 Calcaneal spur, unspecified foot: Secondary | ICD-10-CM | POA: Diagnosis not present

## 2013-02-12 DIAGNOSIS — M25476 Effusion, unspecified foot: Secondary | ICD-10-CM | POA: Diagnosis not present

## 2013-02-12 DIAGNOSIS — M25579 Pain in unspecified ankle and joints of unspecified foot: Secondary | ICD-10-CM | POA: Insufficient documentation

## 2013-02-12 DIAGNOSIS — L853 Xerosis cutis: Secondary | ICD-10-CM

## 2013-02-12 DIAGNOSIS — M25473 Effusion, unspecified ankle: Secondary | ICD-10-CM

## 2013-02-12 DIAGNOSIS — L258 Unspecified contact dermatitis due to other agents: Secondary | ICD-10-CM | POA: Diagnosis not present

## 2013-02-12 NOTE — Patient Instructions (Addendum)
F/u as before  Your left ankle is swollen due to arthritis. You will get tylenol 1 tablet in office before you leave for the swelling, and also 2 more to take over the next 2 days, one every day.  Xray today of left ankle  We will attempt to get name of medication prescribed in the past for your dry skin and send in a refill  Healthy protein is fish, chicken, beans, [place]

## 2013-02-12 NOTE — Progress Notes (Signed)
  Subjective:    Patient ID: Kathleen Gallegos, female    DOB: 01-13-1926, 79 y.o.   MRN: [id number]  HPI  Swelling of left ankle since yesterday, no pain.Concerned that it may be heart related and due to fluid retention Concerned about dry scaly skin  Review of Systems See HPI Denies recent fever or chills. Denies sinus pressure, nasal congestion, ear pain or sore throat. Denies chest congestion, productive cough or wheezing. Denies chest pains, palpitations and leg swelling Denies abdominal pain, nausea, vomiting,diarrhea or constipation.   Denies dysuria, frequency, hesitancy or incontinence.  Denies headaches, seizures, numbness, or tingling. Denies depression,uncontrolled  anxiety or insomnia. Denies skin break down or rash.        Objective:   Physical Exam  Patient alert and oriented and in no cardiopulmonary distress.  HEENT: No facial asymmetry, EOMI, no sinus tenderness,  oropharynx pink and moist.  Neck supple no adenopathy.  Chest: Clear to auscultation bilaterally.  CVS: S1, S2 no murmurs, no S3.  ABD: Soft non tender. Bowel sounds normal.  Ext: No edema  MS: Adequate ROM spine, shoulders, hips and knees.swelling and deformity of left ankle, no edema, adequate ROM  Of ankle  Skin: Intact, excessively dry and flaky, unchanged. Psych: Good eye contact, normal affect. Memory intact  anxious not depressed appearing.  CNS: CN 2-12 intact, power, tone and sensation normal throughout.       Assessment & Plan:

## 2013-02-15 DIAGNOSIS — L853 Xerosis cutis: Secondary | ICD-10-CM | POA: Insufficient documentation

## 2013-02-15 NOTE — Assessment & Plan Note (Signed)
No evidence of fluid retention , /heart diease as the problem, tylenol in office and xray

## 2013-02-19 ENCOUNTER — Ambulatory Visit (INDEPENDENT_AMBULATORY_CARE_PROVIDER_SITE_OTHER): Payer: Medicare Other

## 2013-02-19 VITALS — BP 120/80 | Wt 120.4 lb

## 2013-02-19 DIAGNOSIS — J301 Allergic rhinitis due to pollen: Secondary | ICD-10-CM | POA: Diagnosis not present

## 2013-02-19 NOTE — Progress Notes (Signed)
Injection given in left arm with no signs of any complications

## 2013-02-20 ENCOUNTER — Ambulatory Visit: Payer: Self-pay | Admitting: Internal Medicine

## 2013-02-26 ENCOUNTER — Ambulatory Visit (INDEPENDENT_AMBULATORY_CARE_PROVIDER_SITE_OTHER): Payer: Medicare Other

## 2013-02-26 VITALS — BP 118/78 | Wt 119.8 lb

## 2013-02-26 DIAGNOSIS — J301 Allergic rhinitis due to pollen: Secondary | ICD-10-CM

## 2013-02-26 DIAGNOSIS — Q809 Congenital ichthyosis, unspecified: Secondary | ICD-10-CM | POA: Diagnosis not present

## 2013-02-26 NOTE — Progress Notes (Signed)
PATIENT RECEIVED INJECTION WITH NO COMPLICATIONS IN RIGHT ARM SQ

## 2013-03-17 DIAGNOSIS — Z961 Presence of intraocular lens: Secondary | ICD-10-CM | POA: Diagnosis not present

## 2013-03-17 DIAGNOSIS — H04129 Dry eye syndrome of unspecified lacrimal gland: Secondary | ICD-10-CM | POA: Diagnosis not present

## 2013-03-18 ENCOUNTER — Encounter (HOSPITAL_COMMUNITY): Payer: Self-pay | Admitting: Psychology

## 2013-03-18 NOTE — Progress Notes (Deleted)
Psychiatric Assessment Adult  Patient Identification:  Kathleen Gallegos Date of Evaluation:  03/18/2013 Chief Complaint: *** History of Chief Complaint:   Chief Complaint  Patient presents with  . Anxiety  . Agitation  . Stress    HPI Review of Systems Physical Exam  Depressive Symptoms: {DEPRESSION SYMPTOMS:20000}  (Hypo) Manic Symptoms:   Elevated Mood:  {BHH YES OR NO:22294} Irritable Mood:  {BHH YES OR NO:22294} Grandiosity:  {BHH YES OR NO:22294} Distractibility:  {BHH YES OR NO:22294} Labiality of Mood:  {BHH YES OR NO:22294} Delusions:  {BHH YES OR NO:22294} Hallucinations:  {BHH YES OR NO:22294} Impulsivity:  {BHH YES OR NO:22294} Sexually Inappropriate Behavior:  {BHH YES OR NO:22294} Financial Extravagance:  {BHH YES OR NO:22294} Flight of Ideas:  {BHH YES OR NO:22294}  Anxiety Symptoms: Excessive Worry:  {BHH YES OR NO:22294} Panic Symptoms:  {BHH YES OR NO:22294} Agoraphobia:  {BHH YES OR NO:22294} Obsessive Compulsive: {BHH YES OR NO:22294}  Symptoms: {Obsessive Compulsive Symptoms:22671} Specific Phobias:  {BHH YES OR NO:22294} Social Anxiety:  {BHH YES OR NO:22294}  Psychotic Symptoms:  Hallucinations: {BHH YES OR NO:22294} {Hallucinations:22672} Delusions:  {BHH YES OR NO:22294} Paranoia:  {BHH YES OR NO:22294}   Ideas of Reference:  {BHH YES OR NO:22294}  PTSD Symptoms: Ever had a traumatic exposure:  {BHH YES OR NO:22294} Had a traumatic exposure in the last month:  {BHH YES OR NO:22294} Re-experiencing: {BHH YES OR NO:22294} {Re-experiencing:22673} Hypervigilance:  {BHH YES OR NO:22294} Hyperarousal: {BHH YES OR NO:22294} {Hyperarousal:22674} Avoidance: {BHH YES OR NO:22294} {Avoidance:22675}  Traumatic Brain Injury: {BHH YES OR NO:22294} {Traumatic Brain Injury:22676}  Past Psychiatric History: Diagnosis: ***  Hospitalizations: ***  Outpatient Care: ***  Substance Abuse Care: ***  Self-Mutilation: ***  Suicidal Attempts: ***   Violent Behaviors: ***   Past Medical History:   Past Medical History  Diagnosis Date  . IBS (irritable bowel syndrome)   . Anxiety disorder   . Hyperlipidemia   . Lung nodule   . Angioneurotic edema not elsewhere classified   . Allergic rhinitis, cause unspecified   . Asthma   . Carpal tunnel syndrome   . Chronic abdominal pain   . GERD (gastroesophageal reflux disease)   . Insomnia   . Palpitations   . Osteoarthritis   . Senile osteoporosis   . Fecal incontinence   . Carotid bruit   . Pancytopenia    History of Loss of Consciousness:  {BHH YES OR NO:22294} Seizure History:  {BHH YES OR NO:22294} Cardiac History:  {BHH YES OR NO:22294} Allergies:   Allergies  Allergen Reactions  . Diphenhydramine Hcl Other (See Comments)    unknown  . Rivastigmine Tartrate Palpitations   Current Medications:  Current Outpatient Prescriptions  Medication Sig Dispense Refill  . ADVAIR DISKUS 250-50 MCG/DOSE AEPB INHALE 1 PUFF TWICE DAILY. RINSE AFTER USE  60 each  1  . busPIRone (BUSPAR) 5 MG tablet TAKE ONE TABLET BY MOUTH TWICE DAILY  60 tablet  4  . Calcium Citrate-Vitamin D (CITRACAL + D PO) Take 1 tablet by mouth. 250iu of Vitamin D and 200mg  of Calcium daily      . Carboxymeth-Glycerin-Polysorb (REFRESH OPTIVE ADVANCED OP) Apply 1-2 drops to eye daily as needed.       . clorazepate (TRANXENE) 7.5 MG tablet Take 1/2 tab in the am and 1 tab at bedtime  45 tablet  3  . docusate sodium (COLACE) 100 MG capsule Take 100 mg by mouth daily.       Marland Kitchen  EPINEPHrine (EPIPEN) 0.3 mg/0.3 mL DEVI Inject 0.3 mg into the muscle once. For severe allergic reaction       . Lactobacillus Rhamnosus, GG, (CULTURELLE PO) Take 1 capsule by mouth daily.       . metoprolol succinate (TOPROL-XL) 25 MG 24 hr tablet TAKE ONE HALF TABLET BY MOUTH TWICE DAILY  30 tablet  4  . mometasone (NASONEX) 50 MCG/ACT nasal spray Place 2 sprays into the nose daily as needed (allergies).       Marland Kitchen NATURAL PSYLLIUM SEED PO Take  1 capsule by mouth daily as needed. constipation      . omeprazole (PRILOSEC OTC) 20 MG tablet Take 20 mg by mouth daily before breakfast.       . OVER THE COUNTER MEDICATION Take 2 capsules by mouth daily. Omega XL 300 mg      . polyethylene glycol powder (MIRALAX) powder Take 8.5-17 g by mouth 2 (two) times daily. Take half dose (8.5g)  in the morning and 17 g at night       No current facility-administered medications for this visit.    Previous Psychotropic Medications:  Medication Dose   ***  ***                     Substance Abuse History in the last 12 months: Substance Age of 1st Use Last Use Amount Specific Type  Nicotine  ***  ***  ***  ***  Alcohol  ***  ***  ***  ***  Cannabis  ***  ***  ***  ***  Opiates  ***  ***  ***  ***  Cocaine  ***  ***  ***  ***  Methamphetamines  ***  ***  ***  ***  LSD  ***  ***  ***  ***  Ecstasy  ***   ***  ***  ***  Benzodiazepines  ***  ***  ***  ***  Caffeine  ***  ***  ***  ***  Inhalants  ***  ***  ***  ***  Others:                          Medical Consequences of Substance Abuse: ***  Legal Consequences of Substance Abuse: ***  Family Consequences of Substance Abuse: ***  Blackouts:  {BHH YES OR NO:22294} DT's:  {BHH YES OR NO:22294} Withdrawal Symptoms:  {BHH YES OR NO:22294} {Withdrawal Symptoms:22677}  Social History: Current Place of Residence: *** Place of Birth: *** Family Members: *** Marital Status:  {Marital Status:22678} Children: ***  Sons: ***  Daughters: *** Relationships: *** Education:  {Education:22679} Educational Problems/Performance: *** Religious Beliefs/Practices: *** History of Abuse: {Desc; abuse:16542} Occupational Experiences; Military History:  {Military History:22680} Legal History: *** Hobbies/Interests: ***  Family History:   Family History  Problem Relation Age of Onset  . Hypertension Mother   . Pancreatic cancer Father   . Cancer Brother   . Leukemia      family  history   . Colon cancer      family history     Mental Status Examination/Evaluation: Objective:  Appearance: {Appearance:22683}  Eye Contact::  {BHH EYE CONTACT:22684}  Speech:  {Speech:22685}  Volume:  {Volume (PAA):22686}  Mood:  ***  Affect:  {Affect (PAA):22687}  Thought Process:  {Thought Process (PAA):22688}  Orientation:  {BHH ORIENTATION (PAA):22689}  Thought Content:  {Thought Content:22690}  Suicidal Thoughts:  {ST/HT (PAA):22692}  Homicidal Thoughts:  {ST/HT (PAA):22692}  Judgement:  {Judgement (  YQM):57846}  Insight:  {Insight (PAA):22695}  Psychomotor Activity:  {Psychomotor (PAA):22696}  Akathisia:  {BHH YES OR NO:22294}  Handed:  {Handed:22697}  AIMS (if indicated):  ***  Assets:  {Assets (PAA):22698}    Laboratory/X-Ray Psychological Evaluation(s)   ***  ***   Assessment:  {axis diagnosis:3049000}  AXIS I {psych axis 1:31909}  AXIS II {psych axis 2:31910}  AXIS III Past Medical History  Diagnosis Date  . IBS (irritable bowel syndrome)   . Anxiety disorder   . Hyperlipidemia   . Lung nodule   . Angioneurotic edema not elsewhere classified   . Allergic rhinitis, cause unspecified   . Asthma   . Carpal tunnel syndrome   . Chronic abdominal pain   . GERD (gastroesophageal reflux disease)   . Insomnia   . Palpitations   . Osteoarthritis   . Senile osteoporosis   . Fecal incontinence   . Carotid bruit   . Pancytopenia      AXIS IV {psych axis iv:31915}  AXIS V {psych axis v score:31919}   Treatment Plan/Recommendations:  Plan of Care: ***  Laboratory:  {Laboratory:22682}  Psychotherapy: ***  Medications: ***  Routine PRN Medications:  {BHH YES OR NO:22294}  Consultations: ***  Safety Concerns:  ***  Other:      RODENBOUGH,JOHN R, PsyD 9/10/201411:58 AM

## 2013-03-18 NOTE — Progress Notes (Signed)
Patient:  Kathleen Gallegos   DOB: [birth year]  MR Number: [id number]  Location: [facility] [address] [place] [address] Dept: [phone]  Start: 2 PM  End: 3 PM  Provider/Observer:     Hershal Coria PSYD  Chief Complaint:      Chief Complaint  Patient presents with  . Anxiety  . Agitation  . Stress    Reason For Service:     The patient was referred for psychotherapeutic interventions do to increasing worry and anxiety as well as difficulty adjusting to life changes and aging issues.  Interventions Strategy:  Cognitive/behavioral psychotherapy  Participation Level:   Active  Participation Quality:  Appropriate      Behavioral Observation:  Well Groomed, Alert, and Appropriate.   Current Psychosocial Factors: The patient continues to be    focused on conflicts between her and her daughter and how this is impacting her day-to-day life. We worked on trying to cope better with these issues.  Content of Session:   Reviewed current symptoms and continue to work on therapeutic interventions with particular focus on her somatizations disorder.  Current Status:   The patient continues to show some mild memory problems but they are stable for the most part. The patient is not showing signs of Alzheimer's or other degenerative cortical deficits. The patient reports that she has been experiencing some anxiety but her somatoform symptoms have been much improved.   Patient Progress:   Very good  Target Goals:   Target goals include reducing the frequency, intensity, and duration of her hyper focus and hypervigilance on her somatic issues. 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  Last Reviewed:   01/30/2013   Goals Addressed Today:    Today we were primarily with a somatizations issues.  Impression/Diagnosis:   At this point, the patient does continue to show some significant adjustment difficulties and issues but this does not appear to be anything long-standing. There are no cognitive difficulties or indications of dementia. The patient is in fact relatively healthy. However, there is a continual hyper focus and hypervigilance over her Medical [facility] status and she is continuing to be very concerned about his issues particular her GI functioning.  Diagnosis:    Axis I:  Somatoform disorder  Adjustment disorder with anxiety  Memory loss      Axis II: No diagnosis

## 2013-03-27 ENCOUNTER — Ambulatory Visit (HOSPITAL_COMMUNITY): Payer: Medicare Other | Admitting: Psychology

## 2013-03-31 ENCOUNTER — Ambulatory Visit (INDEPENDENT_AMBULATORY_CARE_PROVIDER_SITE_OTHER): Payer: Medicare Other

## 2013-03-31 VITALS — BP 122/76 | Wt 124.0 lb

## 2013-03-31 DIAGNOSIS — Z23 Encounter for immunization: Secondary | ICD-10-CM

## 2013-03-31 DIAGNOSIS — J301 Allergic rhinitis due to pollen: Secondary | ICD-10-CM

## 2013-04-01 DIAGNOSIS — Z23 Encounter for immunization: Secondary | ICD-10-CM

## 2013-04-10 ENCOUNTER — Other Ambulatory Visit: Payer: Self-pay

## 2013-04-10 ENCOUNTER — Ambulatory Visit: Payer: Self-pay | Admitting: Internal Medicine

## 2013-04-10 ENCOUNTER — Ambulatory Visit (INDEPENDENT_AMBULATORY_CARE_PROVIDER_SITE_OTHER): Payer: Medicare Other

## 2013-04-10 VITALS — BP 128/78 | Wt 123.1 lb

## 2013-04-10 DIAGNOSIS — J309 Allergic rhinitis, unspecified: Secondary | ICD-10-CM | POA: Diagnosis not present

## 2013-04-10 DIAGNOSIS — J301 Allergic rhinitis due to pollen: Secondary | ICD-10-CM

## 2013-04-10 DIAGNOSIS — J302 Other seasonal allergic rhinitis: Secondary | ICD-10-CM

## 2013-04-10 MED ORDER — FLUTICASONE-SALMETEROL 250-50 MCG/DOSE IN AEPB: INHALATION_SPRAY | RESPIRATORY_TRACT | Status: DC

## 2013-04-16 ENCOUNTER — Telehealth: Payer: Self-pay | Admitting: Family Medicine

## 2013-04-16 MED ORDER — FLUTICASONE-SALMETEROL 250-50 MCG/DOSE IN AEPB: INHALATION_SPRAY | RESPIRATORY_TRACT | Status: DC

## 2013-04-16 NOTE — Telephone Encounter (Signed)
Sent to [facility] in [place]  

## 2013-04-16 NOTE — Telephone Encounter (Signed)
Needs her advair please sent [facility] in [place]

## 2013-04-20 ENCOUNTER — Other Ambulatory Visit: Payer: Self-pay

## 2013-04-20 MED ORDER — BUSPIRONE HCL 5 MG PO TABS: ORAL_TABLET | ORAL | Status: DC

## 2013-04-23 ENCOUNTER — Ambulatory Visit (INDEPENDENT_AMBULATORY_CARE_PROVIDER_SITE_OTHER): Payer: Medicare Other

## 2013-04-23 VITALS — BP 120/80 | Ht 66.0 in | Wt 125.1 lb

## 2013-04-23 DIAGNOSIS — J309 Allergic rhinitis, unspecified: Secondary | ICD-10-CM | POA: Diagnosis not present

## 2013-04-24 DIAGNOSIS — D449 Neoplasm of uncertain behavior of unspecified endocrine gland: Secondary | ICD-10-CM | POA: Diagnosis not present

## 2013-04-24 NOTE — Progress Notes (Signed)
Patient in for weekly allergy injection.   No sign or symptom of adverse reaction.  Patient aware that she should return in a week for next injection.

## 2013-04-30 ENCOUNTER — Ambulatory Visit: Payer: Medicare Other

## 2013-05-06 ENCOUNTER — Ambulatory Visit (INDEPENDENT_AMBULATORY_CARE_PROVIDER_SITE_OTHER): Payer: Medicare Other

## 2013-05-06 DIAGNOSIS — J309 Allergic rhinitis, unspecified: Secondary | ICD-10-CM

## 2013-05-07 ENCOUNTER — Ambulatory Visit (INDEPENDENT_AMBULATORY_CARE_PROVIDER_SITE_OTHER): Payer: Medicare Other

## 2013-05-07 VITALS — BP 118/80 | Wt 123.4 lb

## 2013-05-07 DIAGNOSIS — J301 Allergic rhinitis due to pollen: Secondary | ICD-10-CM

## 2013-05-07 NOTE — Progress Notes (Signed)
Patient received injection in left arm with no complications 

## 2013-05-14 ENCOUNTER — Ambulatory Visit (INDEPENDENT_AMBULATORY_CARE_PROVIDER_SITE_OTHER): Payer: Medicare Other

## 2013-05-14 VITALS — BP 128/64 | Wt 125.0 lb

## 2013-05-14 DIAGNOSIS — J301 Allergic rhinitis due to pollen: Secondary | ICD-10-CM

## 2013-05-14 DIAGNOSIS — J309 Allergic rhinitis, unspecified: Secondary | ICD-10-CM

## 2013-05-20 ENCOUNTER — Ambulatory Visit (INDEPENDENT_AMBULATORY_CARE_PROVIDER_SITE_OTHER): Payer: Medicare Other

## 2013-05-20 VITALS — BP 110/80 | Wt 128.8 lb

## 2013-05-20 DIAGNOSIS — J301 Allergic rhinitis due to pollen: Secondary | ICD-10-CM | POA: Diagnosis not present

## 2013-05-20 NOTE — Progress Notes (Signed)
Patient received injection in left arm with no complications

## 2013-05-26 ENCOUNTER — Ambulatory Visit (INDEPENDENT_AMBULATORY_CARE_PROVIDER_SITE_OTHER): Payer: Medicare Other | Admitting: Family Medicine

## 2013-05-26 ENCOUNTER — Encounter: Payer: Self-pay | Admitting: Family Medicine

## 2013-05-26 ENCOUNTER — Telehealth: Payer: Self-pay | Admitting: Family Medicine

## 2013-05-26 VITALS — BP 118/74 | HR 67 | Resp 16 | Ht 66.0 in | Wt 128.0 lb

## 2013-05-26 DIAGNOSIS — G459 Transient cerebral ischemic attack, unspecified: Secondary | ICD-10-CM

## 2013-05-26 DIAGNOSIS — R002 Palpitations: Secondary | ICD-10-CM

## 2013-05-26 DIAGNOSIS — L853 Xerosis cutis: Secondary | ICD-10-CM

## 2013-05-26 DIAGNOSIS — Z13820 Encounter for screening for osteoporosis: Secondary | ICD-10-CM

## 2013-05-26 DIAGNOSIS — R5381 Other malaise: Secondary | ICD-10-CM

## 2013-05-26 DIAGNOSIS — J302 Other seasonal allergic rhinitis: Secondary | ICD-10-CM

## 2013-05-26 DIAGNOSIS — E785 Hyperlipidemia, unspecified: Secondary | ICD-10-CM

## 2013-05-26 DIAGNOSIS — F0390 Unspecified dementia without behavioral disturbance: Secondary | ICD-10-CM | POA: Diagnosis not present

## 2013-05-26 DIAGNOSIS — M1990 Unspecified osteoarthritis, unspecified site: Secondary | ICD-10-CM

## 2013-05-26 DIAGNOSIS — M810 Age-related osteoporosis without current pathological fracture: Secondary | ICD-10-CM

## 2013-05-26 DIAGNOSIS — K219 Gastro-esophageal reflux disease without esophagitis: Secondary | ICD-10-CM

## 2013-05-26 DIAGNOSIS — F411 Generalized anxiety disorder: Secondary | ICD-10-CM

## 2013-05-26 DIAGNOSIS — I739 Peripheral vascular disease, unspecified: Secondary | ICD-10-CM

## 2013-05-26 DIAGNOSIS — J309 Allergic rhinitis, unspecified: Secondary | ICD-10-CM

## 2013-05-26 DIAGNOSIS — L258 Unspecified contact dermatitis due to other agents: Secondary | ICD-10-CM

## 2013-05-26 DIAGNOSIS — G4700 Insomnia, unspecified: Secondary | ICD-10-CM

## 2013-05-26 MED ORDER — CLORAZEPATE DIPOTASSIUM 7.5 MG PO TABS: ORAL_TABLET | ORAL | Status: DC

## 2013-05-26 MED ORDER — EPINEPHRINE 0.3 MG/0.3ML IJ SOAJ: 0.3000 mg | Freq: Once | INTRAMUSCULAR | Status: DC

## 2013-05-26 NOTE — Telephone Encounter (Signed)
Patient aware epipen refilled per Dr

## 2013-05-26 NOTE — Patient Instructions (Addendum)
F/u in mid to end January, call if you need me before  Fasting lipid, chem 7 , vit D and B12 level in January before visit.  You will be referred for a bone density test , past due  Please schedule an appointment with Dr Shelva Majestic, and take a list with your daughter's concerns, I will send him a message to expect this

## 2013-05-26 NOTE — Progress Notes (Signed)
  Subjective:    Patient ID: Kathleen Gallegos, female    DOB: [birth year], 79 y.o.   MRN: [id number]  HPI The PT is here for follow up and re-evaluation of chronic medical conditions, medication management and review of any available recent lab and radiology data.  Preventive health is updated, specifically  Cancer screening and Immunization.    The PT denies any adverse reactions to current medications since the last visit.  There are no new concerns.   complains of increased stress and conflict with her daughter who lives with her, needs increased  Psychotherapy sessions for coping strategies, and just to verbalize  C/o dry skin, uses lotion in office to demonstrate the response and to see if this is sufficient , which in my opinion it is      Review of Systems See HPI Denies recent fever or chills. Denies sinus pressure, nasal congestion, ear pain or sore throat. Denies chest congestion, productive cough or wheezing. Denies chest pains, palpitations and leg swelling Intermittent cramping abdominal pain  With hard stool in balls is unchanged.   Denies dysuria, frequency, hesitancy or incontinence. Mild intermittent  joint pain, and no  limitation in mobility. Denies headaches, seizures, numbness, or tingling. Denies depression,  or uncontrolled insomnia.       Objective:   Physical Exam  Patient alert and oriented and in no cardiopulmonary distress.  HEENT: No facial asymmetry, EOMI, no sinus tenderness,  oropharynx pink and moist.  Neck decreased though adequate ROM,  no adenopathy.  Chest: Clear to auscultation bilaterally.  CVS: S1, S2 no murmurs, no S3.  ABD: Soft non tender. Bowel sounds normal.  Ext: No edema  MS: Adequate ROM spine, shoulders, hips and knees.  Skin: Intact, no ulcerations or rash noted.  Psych: Good eye contact, normal affect. Memory impaired  not anxious or depressed appearing.  CNS: CN 2-12 intact, power, tone and sensation normal  throughout.       Assessment & Plan:

## 2013-05-29 ENCOUNTER — Ambulatory Visit (INDEPENDENT_AMBULATORY_CARE_PROVIDER_SITE_OTHER): Payer: Medicare Other | Admitting: Internal Medicine

## 2013-05-29 ENCOUNTER — Ambulatory Visit (INDEPENDENT_AMBULATORY_CARE_PROVIDER_SITE_OTHER)
Admission: RE | Admit: 2013-05-29 | Discharge: 2013-05-29 | Disposition: A | Discharge status: Home / Self Care | Payer: Medicare Other | Source: Ambulatory Visit | Attending: Internal Medicine | Admitting: Internal Medicine

## 2013-05-29 ENCOUNTER — Encounter: Payer: Self-pay | Admitting: Internal Medicine

## 2013-05-29 VITALS — BP 110/68 | HR 81 | Ht 65.0 in | Wt 125.5 lb

## 2013-05-29 DIAGNOSIS — J45901 Unspecified asthma with (acute) exacerbation: Secondary | ICD-10-CM | POA: Diagnosis not present

## 2013-05-29 DIAGNOSIS — J984 Other disorders of lung: Secondary | ICD-10-CM

## 2013-05-29 DIAGNOSIS — J301 Allergic rhinitis due to pollen: Secondary | ICD-10-CM | POA: Diagnosis not present

## 2013-05-29 DIAGNOSIS — R911 Solitary pulmonary nodule: Secondary | ICD-10-CM

## 2013-05-29 DIAGNOSIS — J309 Allergic rhinitis, unspecified: Secondary | ICD-10-CM

## 2013-05-29 DIAGNOSIS — J302 Other seasonal allergic rhinitis: Secondary | ICD-10-CM

## 2013-05-29 DIAGNOSIS — J45909 Unspecified asthma, uncomplicated: Secondary | ICD-10-CM

## 2013-05-29 DIAGNOSIS — J45998 Other asthma: Secondary | ICD-10-CM

## 2013-05-29 NOTE — Progress Notes (Signed)
Patient ID: Kathleen Gallegos, female    DOB: 1925/12/15, 79 y.o.   MRN: [id number]  HPI 03/27/2011-79 year old female never smoker followed for asthma, allergic rhinitis Last here 07/24/2010-note reviewed She notices persistent left nostril stuffiness in the mornings with watery rhinorrhea but no blood or headache. This has not gotten worse in the fall weather. She continues allergy shots, believes they help her. Denies wheeze cough or shortness of breath and has not been using her rescue inhaler.  06/08/12--79 year old female never smoker followed for asthma, allergic rhinitis No significant respiratory infections this winter. Her primary physician gave Cipro for urinary infection ending yesterday. This also improved her sinus drainage which is noted mostly when she is lying down. She denies cough and says her chest feels clear.  11/07/11- 79 year old female never smoker followed for asthma, allergic rhinitis   PCP Dr Lodema Hong Family member here Scratchy throat with increased pollen; nasal drainage every morning-white in color. Nasal congestion. CT chest 10/09/2011 was reviewed with them in detail:. Dr. Lodema Hong had been good enough to send a note directing attention to her left lower lobe nodule. A thyroid nodule was also seen and biopsied "benign". IMPRESSION:  1. Left lower lobe nodule measures slightly larger than the  examination from 2008. Hounsfield unit measurements from this  nodule are similar to the thoracic aorta and nearby pulmonary  artery and veins. The differential diagnosis for this nodule  includes pulmonary AVM, slow-growing pulmonary neoplasm, or  impacted distal bronchial containing inspissated mucous.  Management strategies include follow-up imaging  to document stability. A contrast-enhanced CT of the chest and 3  months would be recommended in this case. Alternatively,  endobronchial ultrasound may be helpful for further evaluation.  This nodule may be too small to  biopsy percutaneously and is below  the size threshold for PET CT.  2. The other small nodules are stable when compared with previous  imaging.  3. Increase in size of right lobe of thyroid gland nodule.  Suggest further evaluation with thyroid sonography.  Original Report Authenticated By: Rosealee Albee, M.D.   02/15/12- 79 year old female never smoker followed for asthma, allergic rhinitis, lung nodule   PCP Dr Lodema Hong Scheduled 6 MWT was cancelled due to late arrival. Here with daughter   says breathing has been good and she can walk 2 miles a day. Blames dust and mold for hoarseness all summer. CT chest - 02/14/12 reviewed with them. IMPRESSION:  9 x 7 mm left lower lobe pulmonary opacity, favored to reflect  inspissated mucus within a bronchocele. Given only minimally  increase in size when compared to 2009, this appearance is almost  certainly benign.  If additional follow-up imaging is desired, unenhanced CT is  recommended to confirm the opacity is intrinsically hyperdense  rather than enhancing.  Original Report Authenticated By: Charline Bills, M.D.   05/29/13- 79 year old female never smoker followed for asthma, allergic rhinitis, lung nodule   PCP Dr Lodema Hong FOLLOWS FOR:  Breathing doing well.  Having PND and some mucus, having to clear throat  doing pretty well but admits some postnasal drip. Denies routine cough or chest tightness. Did cough up a slight streak of blood at least once. Denies night sweats, fever, adenopathy. She had been getting allergy vaccine but was irregular with her shots. We discussed likelihood at her age that they were no longer helpful.  Review of Systems-see HPI   Constitutional:   No-   weight loss, night sweats, fevers, chills, fatigue, lassitude. HEENT:  No-  headaches, difficulty swallowing, tooth/dental problems, sore throat,       No-  sneezing, itching, ear ache,  +nasal congestion, post nasal drip, hoarseness CV:  No-   chest pain,  orthopnea, PND, swelling in lower extremities, anasarca, dizziness, palpitations Resp: + shortness of breath with exertion or at rest.              No-   productive cough,  No non-productive cough,  Once- coughing up of blood.              No-   change in color of mucus.  No- wheezing.   Skin: No-   rash or lesions. GI:  No-   heartburn, indigestion, abdominal pain, nausea, vomiting,  GU: MS:  No-   joint pain or swelling.   Neuro-  Psych:  No- change in mood or affect. No depression or anxiety.  No memory loss.  OBJ- General- Alert, Oriented, Affect-appropriate, Distress- none acute, comfortable-appearing elderly woman, talkative Skin- rash-none, lesions- none, excoriation- none Lymphadenopathy- none Head- atraumatic            Eyes- Gross vision intact, PERRLA, conjunctivae clear secretions            Ears- Hearing, canals normal for age            Nose- Clear, no-Septal dev, mucus, polyps, erosion, perforation             Throat- Mallampati II , mucosa clear , drainage- none, tonsils- atrophic, missing teeth Neck- flexible , trachea midline, no stridor , thyroid nl, carotid no bruit Chest - symmetrical excursion , unlabored           Heart/CV- RRR , no murmur , no gallop  , no rub, nl s1 s2                           - JVD- none , edema- none, stasis changes- none, varices- none           Lung-  +Distant but clear to P&A, wheeze- none, cough- none , dullness-none, rub- none           Chest wall-  Abd-  Br/ Gen/ Rectal- Not done, not indicated Extrem- cyanosis- none, clubbing, none, atrophy- none, strength- nl Neuro- grossly intact to observation

## 2013-05-29 NOTE — Patient Instructions (Signed)
We can stop allergy vaccine now, or when you finish the current supply  We taked about getting the pneumonia vaccine conjugate 13 (Prevnar). You can decide about that.  Order- CXR - Dx asthma, lung nodule

## 2013-06-02 ENCOUNTER — Telehealth: Payer: Self-pay | Admitting: *Deleted

## 2013-06-02 NOTE — Telephone Encounter (Signed)
Message copied by Ronny Bacon on Tue Jun 02, 2013 12:50 PM ------      Message from: [place], [place] D      Created: Fri May 29, 2013  4:55 PM       CXR report- stable with scarring but no apparent active process.  ------

## 2013-06-03 ENCOUNTER — Ambulatory Visit: Payer: Medicare Other

## 2013-06-08 ENCOUNTER — Other Ambulatory Visit (HOSPITAL_COMMUNITY): Payer: Self-pay

## 2013-06-08 ENCOUNTER — Encounter: Payer: Self-pay | Admitting: Family Medicine

## 2013-06-08 ENCOUNTER — Ambulatory Visit (INDEPENDENT_AMBULATORY_CARE_PROVIDER_SITE_OTHER): Payer: Medicare Other | Admitting: Family Medicine

## 2013-06-08 ENCOUNTER — Telehealth (HOSPITAL_COMMUNITY): Payer: Self-pay

## 2013-06-08 VITALS — BP 140/82 | HR 81 | Resp 16 | Ht 66.0 in | Wt 127.0 lb

## 2013-06-08 DIAGNOSIS — J302 Other seasonal allergic rhinitis: Secondary | ICD-10-CM

## 2013-06-08 DIAGNOSIS — L258 Unspecified contact dermatitis due to other agents: Secondary | ICD-10-CM

## 2013-06-08 DIAGNOSIS — J309 Allergic rhinitis, unspecified: Secondary | ICD-10-CM

## 2013-06-08 DIAGNOSIS — I739 Peripheral vascular disease, unspecified: Secondary | ICD-10-CM

## 2013-06-08 DIAGNOSIS — E785 Hyperlipidemia, unspecified: Secondary | ICD-10-CM | POA: Diagnosis not present

## 2013-06-08 DIAGNOSIS — M7989 Other specified soft tissue disorders: Secondary | ICD-10-CM | POA: Insufficient documentation

## 2013-06-08 DIAGNOSIS — F411 Generalized anxiety disorder: Secondary | ICD-10-CM

## 2013-06-08 DIAGNOSIS — G4700 Insomnia, unspecified: Secondary | ICD-10-CM

## 2013-06-08 DIAGNOSIS — L853 Xerosis cutis: Secondary | ICD-10-CM

## 2013-06-08 DIAGNOSIS — R002 Palpitations: Secondary | ICD-10-CM

## 2013-06-08 DIAGNOSIS — R0989 Other specified symptoms and signs involving the circulatory and respiratory systems: Secondary | ICD-10-CM | POA: Diagnosis not present

## 2013-06-08 MED ORDER — FUROSEMIDE 20 MG PO TABS: ORAL_TABLET | ORAL | Status: DC

## 2013-06-08 MED ORDER — POTASSIUM CHLORIDE CRYS ER 20 MEQ PO TBCR: 20.0000 meq | EXTENDED_RELEASE_TABLET | Freq: Two times a day (BID) | ORAL | Status: DC

## 2013-06-08 NOTE — Progress Notes (Signed)
   Subjective:    Patient ID: Kathleen Gallegos, female    DOB: [birth year], 79 y.o.   MRN: [id number]  HPI Pt in with cold sensation on lateral aspect of left thigh started approx 2 weeks ago, also, noted bilateral foot swelling, left more than right , tender ankle, .Concerned about circulation, in legs, concerned about heart problems, states she is also now hearing heartbeat on right side which is new. Uses 1 pillow, never wakes up SOB, no chest pain , still walks  regularly denies fatigue with activity Has come with ;lotion not yet applied to skin , and demonstrates excellent response to topical OTc lotion, which I advise her to continue   Review of Systems See HPI Denies recent fever or chills. Denies sinus pressure, nasal congestion, ear pain or sore throat. Denies chest congestion, productive cough or wheezing. Denies chest pains, palpitations c/o leg swelling Denies abdominal pain, nausea, vomiting,diarrhea or constipation.   Denies dysuria, frequency, hesitancy or incontinence. . Denies unjcontrolled  depression, anxiety or insomnia.Needs to resume therapy, as continues to have strained relationship at home with her daughter        Objective:   Physical Exam  Patient alert and oriented and in no cardiopulmonary distress.  HEENT: No facial asymmetry, EOMI, no sinus tenderness,  oropharynx pink and moist.  Neck decreased though adequate ROM no adenopathy.Carotid bruit  Chest: Clear to auscultation bilaterally.  CVS: S1, S2 no murmurs, no S3.Decreased Dorsalis pedis biilaterally  ABD: Soft non tender. Bowel sounds normal.  Ext: one plus  Edema to just proximal to ankles  MS: Adequate though reduced  ROM spine, shoulders, hips and knees.Deformity of ankles, left greater than right  Skin: Intact, very dry , no skin breakdown  Psych: Good eye contact, normal affect. Memory intact not anxious or depressed appearing.  CNS: CN 2-12 intact, power, tone and sensation normal  throughout.       Assessment & Plan:

## 2013-06-08 NOTE — Patient Instructions (Addendum)
F/u as before  New for leg swelling is lasix one  daily for 1 week, then you will have 3 extra tablets to take once daily only if needed  Potassium twice daily, when you take lasix  Skin is doing good with the lotion, many people get drying of the skin with aging  You are referred for testing of circulation in feet, also for circulation to the brain.  The cold feeling down your left thigh is likely from pinched nerve in the back from arthritis (not circulation)  Get appt with Dr Shelva Majestic

## 2013-06-09 ENCOUNTER — Telehealth: Payer: Self-pay | Admitting: Internal Medicine

## 2013-06-09 MED ORDER — EPINEPHRINE 0.3 MG/0.3ML IJ SOAJ: 0.3000 mg | Freq: Once | INTRAMUSCULAR | Status: DC

## 2013-06-09 NOTE — Telephone Encounter (Signed)
Rx has been sent in. Pt's daughter is aware. 

## 2013-06-10 ENCOUNTER — Telehealth (HOSPITAL_COMMUNITY): Payer: Self-pay

## 2013-06-12 ENCOUNTER — Ambulatory Visit (HOSPITAL_COMMUNITY)
Admission: RE | Admit: 2013-06-12 | Discharge: 2013-06-12 | Disposition: A | Discharge status: Home / Self Care | Payer: Medicare Other | Source: Ambulatory Visit | Attending: Family Medicine | Admitting: Family Medicine

## 2013-06-12 DIAGNOSIS — R0989 Other specified symptoms and signs involving the circulatory and respiratory systems: Secondary | ICD-10-CM | POA: Diagnosis not present

## 2013-06-12 DIAGNOSIS — I6529 Occlusion and stenosis of unspecified carotid artery: Secondary | ICD-10-CM | POA: Diagnosis not present

## 2013-06-14 NOTE — Assessment & Plan Note (Signed)
Concern re poor circulation in lower extremities, refer for ABI

## 2013-06-14 NOTE — Assessment & Plan Note (Signed)
Dietary management only at this time per request of family member, concern re possible s/e memory loss with statin Hyperlipidemia:Low fat diet discussed and encouraged.

## 2013-06-14 NOTE — Assessment & Plan Note (Signed)
Plan-stop allergy vaccine. Treat symptomatically

## 2013-06-14 NOTE — Assessment & Plan Note (Signed)
Irregular with her shots. They're probably no longer helpful. Plan-stopped allergy vaccine and observe.

## 2013-06-14 NOTE — Assessment & Plan Note (Signed)
Controlled, no change in medication Continue immunotherapy

## 2013-06-14 NOTE — Assessment & Plan Note (Signed)
Controlled, no change in medication Sleep hygiene reviewed 

## 2013-06-14 NOTE — Assessment & Plan Note (Signed)
Unchanged,  Benefits greatly from psychotherapy and is encouraged to continue same

## 2013-06-14 NOTE — Assessment & Plan Note (Addendum)
We discussed pneumonia vaccine. She will update later. Control is good. Possible mild bronchitis component, noting one episode of streaky hemoptysis with hard cough. Watch for recurrence.

## 2013-06-14 NOTE — Assessment & Plan Note (Addendum)
Like;ly due to osteoarthritis, x ray already done, pt education only , judicious use of lasix with potassium for short period only

## 2013-06-14 NOTE — Assessment & Plan Note (Signed)
Regular use of OTC lotion adequate

## 2013-06-14 NOTE — Assessment & Plan Note (Signed)
For update chest x-ray

## 2013-06-14 NOTE — Assessment & Plan Note (Signed)
Refer for evaluation for possible stenosis

## 2013-06-14 NOTE — Assessment & Plan Note (Signed)
Has been evaluated by cardiology, controlled with peta blocker

## 2013-06-17 ENCOUNTER — Other Ambulatory Visit: Payer: Self-pay | Admitting: Family Medicine

## 2013-06-17 ENCOUNTER — Telehealth: Payer: Self-pay | Admitting: Family Medicine

## 2013-06-17 DIAGNOSIS — I739 Peripheral vascular disease, unspecified: Secondary | ICD-10-CM

## 2013-06-17 NOTE — Telephone Encounter (Signed)
Daughter Aram Beecham is aware also has the phone number to reschedule this

## 2013-06-19 ENCOUNTER — Ambulatory Visit (HOSPITAL_COMMUNITY): Payer: Medicare Other

## 2013-06-24 ENCOUNTER — Ambulatory Visit (HOSPITAL_COMMUNITY)
Admission: RE | Admit: 2013-06-24 | Discharge: 2013-06-24 | Disposition: A | Discharge status: Home / Self Care | Payer: Medicare Other | Source: Ambulatory Visit | Attending: Family Medicine | Admitting: Family Medicine

## 2013-06-24 DIAGNOSIS — Z1212 Encounter for screening for malignant neoplasm of rectum: Secondary | ICD-10-CM | POA: Diagnosis not present

## 2013-06-24 DIAGNOSIS — M79609 Pain in unspecified limb: Secondary | ICD-10-CM | POA: Diagnosis not present

## 2013-06-24 DIAGNOSIS — I739 Peripheral vascular disease, unspecified: Secondary | ICD-10-CM

## 2013-06-24 DIAGNOSIS — Z01419 Encounter for gynecological examination (general) (routine) without abnormal findings: Secondary | ICD-10-CM | POA: Diagnosis not present

## 2013-06-24 DIAGNOSIS — E785 Hyperlipidemia, unspecified: Secondary | ICD-10-CM | POA: Diagnosis not present

## 2013-06-24 DIAGNOSIS — M899 Disorder of bone, unspecified: Secondary | ICD-10-CM | POA: Diagnosis not present

## 2013-06-26 ENCOUNTER — Encounter: Payer: Self-pay | Admitting: Cardiovascular Disease

## 2013-06-26 ENCOUNTER — Ambulatory Visit (INDEPENDENT_AMBULATORY_CARE_PROVIDER_SITE_OTHER): Payer: Medicare Other | Admitting: Cardiovascular Disease

## 2013-06-26 VITALS — BP 159/86 | HR 63 | Ht 66.0 in | Wt 130.0 lb

## 2013-06-26 DIAGNOSIS — R002 Palpitations: Secondary | ICD-10-CM | POA: Diagnosis not present

## 2013-06-26 NOTE — Progress Notes (Signed)
06/26/2013 Kathleen Gallegos   1925-12-06  [id number]  Primary Physician [name], MD Primary Cardiologist: Runell Gess MD Kathleen Gallegos   HPI:  The patient is a delightful 79 year old, thin appearing, widowed, African American female mother of 3 children who is accompanied by her oldest daughter today who is her medical power of attorney. I last saw her 6 months ago. She has a history of treated dyslipidemia, irritable bowel syndrome, mild dementia and anxiety. She denies chest pain or shortness of breath. She has had a normal 2D echo and Myoview in the past. Her lipid profile was excellent for primary prevention. Apparently she was taken off her statin by her family because of concerns regarding short-term memory loss    Current Outpatient Prescriptions  Medication Sig Dispense Refill  . acetaminophen (TYLENOL) 500 MG tablet Take 500 mg by mouth every 6 (six) hours as needed.      Marland Kitchen aspirin 81 MG tablet Take 81 mg by mouth as needed.       . B Complex Vitamins (B COMPLEX 50) TABS Take 1 tablet by mouth daily.      . busPIRone (BUSPAR) 5 MG tablet TAKE ONE TABLET BY MOUTH TWICE DAILY  60 tablet  4  . Calcium Citrate-Vitamin D (CITRACAL + D PO) Take 1 tablet by mouth. 250iu of Vitamin D and 200mg  of Calcium daily      . Carboxymeth-Glycerin-Polysorb (REFRESH OPTIVE ADVANCED OP) Apply 1-2 drops to eye daily as needed.       . Cholecalciferol (VITAMIN D-3) 1000 UNITS CAPS Take 1 capsule by mouth daily.      . clorazepate (TRANXENE) 7.5 MG tablet Take 1/2 tab in the am and 1 tab at bedtime  45 tablet  3  . docusate sodium (COLACE) 100 MG capsule Take 100 mg by mouth daily.       Marland Kitchen EPINEPHrine (EPI-PEN) 0.3 mg/0.3 mL SOAJ injection Inject 0.3 mLs (0.3 mg total) into the muscle once.  1 Device  2  . Fluticasone-Salmeterol (ADVAIR DISKUS) 250-50 MCG/DOSE AEPB INHALE 1 PUFF TWICE DAILY. RINSE AFTER USE  60 each  2  . Lactobacillus Rhamnosus, GG, (CULTURELLE PO) Take 1  capsule by mouth daily.       . metoprolol succinate (TOPROL-XL) 25 MG 24 hr tablet TAKE ONE HALF TABLET BY MOUTH TWICE DAILY  30 tablet  4  . omeprazole (PRILOSEC OTC) 20 MG tablet Take 20 mg by mouth daily before breakfast.       . polyethylene glycol powder (MIRALAX) powder Take 8.5-17 g by mouth 2 (two) times daily. Take half dose (8.5g)  in the morning and 17 g at night      . potassium chloride SA (K-DUR,KLOR-CON) 20 MEQ tablet Take 1 tablet (20 mEq total) by mouth 2 (two) times daily.  20 tablet  0   No current facility-administered medications for this visit.    Allergies  Allergen Reactions  . Diphenhydramine Hcl Other (See Comments)    unknown  . Rivastigmine Tartrate Palpitations    History   Social History  . Marital Status: Widowed    Spouse Name: N/A    Number of Children: 3  . Years of Education: N/A   Occupational History  . retired    Social History Main Topics  . Smoking status: Never Smoker   . Smokeless tobacco: Never Used  . Alcohol Use: No  . Drug Use: No  . Sexual Activity: Not on file  Other Topics Concern  . Not on file   Social History Narrative  . No narrative on file     Review of Systems: General: negative for chills, fever, night sweats or weight changes.  Cardiovascular: negative for chest pain, dyspnea on exertion, edema, orthopnea, palpitations, paroxysmal nocturnal dyspnea or shortness of breath Dermatological: negative for rash Respiratory: negative for cough or wheezing Urologic: negative for hematuria Abdominal: negative for nausea, vomiting, diarrhea, bright red blood per rectum, melena, or hematemesis Neurologic: negative for visual changes, syncope, or dizziness All other systems reviewed and are otherwise negative except as noted above.    Blood pressure 159/86, pulse 63, height 5\' 6"  (1.676 m), weight 130 lb (58.968 kg).  General appearance: alert and no distress Neck: no adenopathy, no carotid bruit, no JVD, supple,  symmetrical, trachea midline and thyroid not enlarged, symmetric, no tenderness/mass/nodules Lungs: clear to auscultation bilaterally Heart: regular rate and rhythm, S1, S2 normal, no murmur, click, rub or gallop Extremities: extremities normal, atraumatic, no cyanosis or edema  EKG sinus bradycardia 59 without ST or T wave changes  ASSESSMENT AND PLAN:   No problem-specific assessment & plan notes found for this encounter.      Runell Gess MD FACP,FACC,FAHA, [facility] 06/26/2013 2:13 PM

## 2013-06-26 NOTE — Patient Instructions (Signed)
Your physician wants you to follow-up in: 1 year with Dr Berry. You will receive a reminder letter in the mail two months in advance. If you don't receive a letter, please call our office to schedule the follow-up appointment.  

## 2013-06-29 ENCOUNTER — Encounter: Payer: Self-pay | Admitting: Cardiovascular Disease

## 2013-06-30 ENCOUNTER — Ambulatory Visit (HOSPITAL_COMMUNITY): Payer: Self-pay | Admitting: Psychology

## 2013-07-05 NOTE — Assessment & Plan Note (Signed)
Improved on medication Increased concerns voiced re relationship with her daughter, has not been receiving therapy as often as in the past and clearly needs this, will send a msg of concern to her hteapist who she has bonded very well with

## 2013-07-05 NOTE — Assessment & Plan Note (Signed)
deteriorated off meds, will  Update lab for next visit and re address option of statin Hyperlipidemia:Low fat diet discussed and encouraged.

## 2013-07-05 NOTE — Assessment & Plan Note (Signed)
Controlled on immunotherapy, continue same

## 2013-07-05 NOTE — Assessment & Plan Note (Signed)
Sleep hygiene reviewed, pt remains dependent of medication to help with sleep

## 2013-07-05 NOTE — Assessment & Plan Note (Signed)
Controlled on medication continue same 

## 2013-07-05 NOTE — Assessment & Plan Note (Signed)
rept dexa needed 

## 2013-07-05 NOTE — Assessment & Plan Note (Signed)
Controlled, no change in medication  

## 2013-07-05 NOTE — Assessment & Plan Note (Signed)
Updated MMSE next visit needed, currently on no medication though she has been in the past Encouraged pt to be involved in community and senior citizen grp

## 2013-07-05 NOTE — Assessment & Plan Note (Signed)
Regular use of OTC lotion in liberal quantitiy is sufficient

## 2013-07-21 DIAGNOSIS — D539 Nutritional anemia, unspecified: Secondary | ICD-10-CM | POA: Diagnosis not present

## 2013-07-21 DIAGNOSIS — R5381 Other malaise: Secondary | ICD-10-CM | POA: Diagnosis not present

## 2013-07-21 DIAGNOSIS — E785 Hyperlipidemia, unspecified: Secondary | ICD-10-CM | POA: Diagnosis not present

## 2013-07-21 DIAGNOSIS — R5383 Other fatigue: Secondary | ICD-10-CM | POA: Diagnosis not present

## 2013-07-21 DIAGNOSIS — F0390 Unspecified dementia without behavioral disturbance: Secondary | ICD-10-CM | POA: Diagnosis not present

## 2013-07-21 DIAGNOSIS — M1990 Unspecified osteoarthritis, unspecified site: Secondary | ICD-10-CM | POA: Diagnosis not present

## 2013-07-21 DIAGNOSIS — M810 Age-related osteoporosis without current pathological fracture: Secondary | ICD-10-CM | POA: Diagnosis not present

## 2013-07-22 LAB — VITAMIN D 25 HYDROXY (VIT D DEFICIENCY, FRACTURES): Vit D, 25-Hydroxy: 46 ng/mL (ref 30–89)

## 2013-07-22 LAB — BASIC METABOLIC PANEL
BUN: 16 mg/dL (ref 6–23)
CO2: 30 mEq/L (ref 19–32)
Calcium: 9.3 mg/dL (ref 8.4–10.5)
Chloride: 101 mEq/L (ref 96–112)
Creat: 0.95 mg/dL (ref 0.50–1.10)
Glucose, Bld: 77 mg/dL (ref 70–99)
Potassium: 4.1 mEq/L (ref 3.5–5.3)
Sodium: 139 mEq/L (ref 135–145)

## 2013-07-22 LAB — LIPID PANEL
Cholesterol: 258 mg/dL — ABNORMAL HIGH (ref 0–200)
HDL: 102 mg/dL (ref >39)
LDL Cholesterol: 147 mg/dL — ABNORMAL HIGH (ref 0–99)
Total CHOL/HDL Ratio: 2.5 Ratio
Triglycerides: 43 mg/dL (ref <150)
VLDL: 9 mg/dL (ref 0–40)

## 2013-07-22 LAB — VITAMIN B12: Vitamin B-12: 918 pg/mL — ABNORMAL HIGH (ref 211–911)

## 2013-07-24 ENCOUNTER — Other Ambulatory Visit (HOSPITAL_COMMUNITY): Payer: Self-pay

## 2013-07-26 NOTE — Progress Notes (Signed)
Kathleen Nakayama, MD [address] [place] [address]  Pancytopenia - Plan: Omega-3 Fatty Acids (OMEGA 3 PO), CBC with Differential, CBC with Differential  CURRENT THERAPY:Surveillance  INTERVAL HISTORY: Kathleen Gallegos 80 y.o. female returns for  regular  visit for followup of intermittent pancytopenia dating back to at least 2008 without any changes.   I personally reviewed and went over laboratory results with the patient.   She certainly is not a good candidate for any intervention at this time and do her labs being very stable for a number of years we will continue to observe. At this time there is no indication for bone marrow aspiration and biopsy particularly since she is not a candidate for any intervention due to her age and comorbidities.  She denies any fevers, chills, night sweats, recurrent infections, increased antibiotic requirements, bleeding, hemoptysis, gingival bleeding, blood in stool, black tarry stool, spontaneous bleeding, and epistaxis.   She notes increased joint swelling and pain.  Clinically, it looks like osteoarthritis.  She also notes some LE edema, that is not significantly appreciable today.  This is being managed by Dr. Moshe Cipro.  We discussed healthy eating choices and her diet does not affect the WBC or platelet count, but can improve Hgb if one is vitamin deficient, which she is not.    We reviewed labs over 5-7 years demonstrating stability of counts.    Hematologically, she denies any complaints and ROS questioning is negative.   Benign ethnic neutropenia-The condition of benign ethnic neutropenia (BEN), also called benign familial neutropenia, constitutional neutropenia, or benign familial leukopenia, is an inherited neutropenia that occurs in individuals of African descent, as well as some other ethnic groups (eg, certain Sephardic Jews, [place], [place], and Arab [place]ians). The neutropenia is often mild. Absolute  neutrophil counts (ANCs) are most commonly greater than 1200 but may occasionally be less than 1000.  Notably, these individuals have normal bone marrow neutrophil reserve; they do not have an increased incidence of infection despite the lack of leukocytosis during infection in some individuals, and they do not have an increased risk of febrile neutropenia from chemotherapy.  The genetic basis for BEN is a polymorphism in the Duffy antigen receptor chemokine [facility]) gene, which encodes the Duffy antigen, a chemokine receptor expressed on the surface of red blood cells (RBCs).  In blood bank records, the Duffy antigen is denoted Fy(ab). Since the receptor is used by malaria parasites to enter RBCs, this polymorphism is protective against malaria; protection from malaria likely explains the high frequency of BEN in affected populations.  The mechanism of neutropenia in patients with BEN is not well understood, nor is the link between [facility] polymorphisms and neutrophil mass. Neutropenia in individuals with BEN may be due to reduced stimulation of neutrophil production, lack of a storage pool of neutrophils, and/or reduced neutrophil chemotaxis. The null allele of the [facility] gene responsible for BEN results in lack of expression of this chemokine receptor on RBCs and vascular cells; the Duffy antigen is not normally expressed on neutrophils. Lack of Duffy expression may cause reduced clearance of pro-inflammatory cytokines that would normally be taken up by RBCs expressing the [facility] receptor and reduced neutrophil chemotaxis due to lack of Duffy antigen expression on endothelial cells.  Individuals who are heterozygous for a null allele at the [facility] locus have mean neutrophil counts similar to those of other ethnic ancestries. Homozygotes for the null allele have a mean neutrophil count that is  shifted lower than the range in individuals without the mutation. The shift in the distribution curve of neutrophil counts for the  population means that some individuals have an ANC that falls below the threshold of 1500.  Because approximately 80 percent of African Blacks are homozygous for the null allele, 10 to 15 percent of these individuals are classified as neutropenic based on ANC. Homozygosity for the null allele is seen in approximately 36 percent of [place]ites. Allele testing for Duffy antigen can be used to confirm a diagnosis of BEN. Benign constitutional neutropenia without DARC polymorphisms also occurs; its mechanism is totally unknown. Patients with a longstanding history of an ANC between 1000 and 1500 have a very benign course, and evaluation of such patients with extensive studies including bone marrow examination is almost uniformly unrevealing. There is no obvious "threshold" ANC below which we feel compelled to look for other sources of neutropenia besides BEN. The best factor determining one's concern for another etiology is the patient's own history of infections and associated conditions, when available. Most hematologists would perform a bone marrow examination on patients with an ANC below 800 however, an ANC of 800 for many years in a completely healthy individual is consistent with BEN.  Of note, it is possible for an individual with BEN to develop an abnormality of neutrophil production such as those described in the following sections. Such an abnormality should be suspected in an individual with BEN if a stable neutrophil count decreases dramatically.    Past Medical History  Diagnosis Date  . IBS (irritable bowel syndrome)   . Anxiety disorder   . Hyperlipidemia   . Lung nodule   . Angioneurotic edema not elsewhere classified   . Allergic rhinitis, cause unspecified   . Asthma   . Carpal tunnel syndrome   . Chronic abdominal pain   . GERD (gastroesophageal reflux disease)   . Insomnia   . Palpitations   . Osteoarthritis   . Senile osteoporosis   . Fecal incontinence   . Carotid bruit   .  Pancytopenia     has TINEA PEDIS; HYPERLIPIDEMIA; Presenile dementia, uncomplicated; GENERALIZED ANXIETY DISORDER; CARPAL TUNNEL SYNDROME; UNSPECIFIED PERIPHERAL VERTIGO; TRANSIENT ISCHEMIC ATTACK; HEMORRHOIDS, WITH BLEEDING; Allergic rhinitis due to pollen; Allergic-infective asthma; LUNG NODULE; GERD; IRRITABLE BOWEL SYNDROME; CONTACT DERMATITIS&OTHER ECZEMA DUE UNSPEC CAUSE; OSTEOARTHRITIS; HIP PAIN, RIGHT; SENILE OSTEOPOROSIS; INSOMNIA; FATIGUE; PALPITATIONS; CHEST PAIN UNSPECIFIED; FECAL INCONTINENCE; ANGIOEDEMA; Pancytopenia; Seasonal allergies; Routine general medical examination at a health care facility; Left ankle swelling; Dry skin dermatitis; Carotid bruit; PVD (peripheral vascular disease); and Leg swelling on her problem list.     is allergic to diphenhydramine hcl and rivastigmine tartrate.  Ms. Remer does not currently have medications on file.  Past Surgical History  Procedure Laterality Date  . Abdominal hysterectomy  1979    fibroids   . Colonoscopy  12/21/04    normal -redundant   . Biopsy thyroid  1970   . Bilateral cataract surgery  2008  . Eus  08/07/07    Denies any headaches, dizziness, double vision, fevers, chills, night sweats, nausea, vomiting, diarrhea, constipation, chest pain, heart palpitations, shortness of breath, blood in stool, black tarry stool, urinary pain, urinary burning, urinary frequency, hematuria.   PHYSICAL EXAMINATION  ECOG PERFORMANCE STATUS: 0 - Asymptomatic  Filed Vitals:   07/27/13 1323  BP: 119/69  Pulse: 68  Temp: 98 F (36.7 C)  Resp: 16    GENERAL:alert, healthy, no distress, well nourished, well developed, comfortable, cooperative and  smiling SKIN: skin color, texture, turgor are normal, no rashes or significant lesions HEAD: Normocephalic, No masses, lesions, tenderness or abnormalities EYES: normal, PERRLA, EOMI, Conjunctiva are pink and non-injected EARS: External ears normal OROPHARYNX:mucous membranes are moist    NECK: supple, trachea midline LYMPH:  not examined BREAST:not examined LUNGS: clear to auscultation  HEART: regular rate & rhythm, no murmurs and no gallops ABDOMEN:abdomen soft and normal bowel sounds BACK: Back symmetric, no curvature. EXTREMITIES:less then 2 second capillary refill, no joint deformities, effusion, or inflammation, no skin discoloration, no clubbing, no cyanosis, positive findings:  edema trace B/L ankle and pedal edema.  NEURO: alert & oriented x 3 with fluent speech, no focal motor/sensory deficits, gait normal   LABORATORY DATA: CBC    Component Value Date/Time   WBC 3.3* 01/14/2013 0800   RBC 3.73* 01/14/2013 0800   HGB 12.3 01/14/2013 0800   HCT 36.4 01/14/2013 0800   PLT 121* 01/14/2013 0800   MCV 97.6 01/14/2013 0800   MCH 33.0 01/14/2013 0800   MCHC 33.8 01/14/2013 0800   RDW 13.1 01/14/2013 0800   LYMPHSABS 1.6 01/14/2013 0800   MONOABS 0.4 01/14/2013 0800   EOSABS 0.1 01/14/2013 0800   BASOSABS 0.0 01/14/2013 0800    PENDING LABS: CBC diff   ASSESSMENT:  1. Intermittent pancytopenia since at least 2008 thus far without change  2. Memory loss, suspect cerebral vascular disease  3. Chronic anxiety  4. Hypercholesterolemia  5. History of asthma  6. Chronic constipation  Patient Active Problem List   Diagnosis Date Noted  . Carotid bruit 06/08/2013  . PVD (peripheral vascular disease) 06/08/2013  . Leg swelling 06/08/2013  . Dry skin dermatitis 02/15/2013  . Left ankle swelling 02/12/2013  . Routine general medical examination at a health care facility 01/21/2013  . Seasonal allergies 07/27/2012  . Pancytopenia 12/27/2011  . HIP PAIN, RIGHT 06/08/2010  . INSOMNIA 12/04/2009  . UNSPECIFIED PERIPHERAL VERTIGO 11/28/2009  . HEMORRHOIDS, WITH BLEEDING 07/27/2009  . TRANSIENT ISCHEMIC ATTACK 03/29/2009  . PALPITATIONS 03/25/2009  . Presenile dementia, uncomplicated 0000000  . FATIGUE 11/26/2008  . CHEST PAIN UNSPECIFIED 11/26/2008  . IRRITABLE BOWEL SYNDROME  10/05/2008  . FECAL INCONTINENCE 10/05/2008  . CONTACT DERMATITIS&OTHER ECZEMA DUE UNSPEC CAUSE 09/23/2008  . SENILE OSTEOPOROSIS 07/25/2008  . TINEA PEDIS 05/24/2008  . GERD 05/24/2008  . HYPERLIPIDEMIA 11/10/2007  . GENERALIZED ANXIETY DISORDER 11/10/2007  . OSTEOARTHRITIS 11/10/2007  . Allergic rhinitis due to pollen 09/18/2007  . Allergic-infective asthma 09/18/2007  . LUNG NODULE 09/18/2007  . ANGIOEDEMA 09/18/2007  . CARPAL TUNNEL SYNDROME 07/21/2007     PLAN:  1. I personally reviewed and went over laboratory results with the patient. 2. Next screening mammogram is due in Feb 2015 3. Labs today: CBC diff 4. Labs in 1 year: CBC diff 5. Patient education regarding  6. Follow-up with PCP as directed 7. Patient education regarding MDS 8. Patient education regarding benign ethnic leukopenia 9. Will add a peripheral smear fro my review to CBC drawn today 10. Return in 1 year for follow-up   THERAPY PLAN:  Since the patient is a poor candidate for any intervention due to her pancytopenia, there is no need for a bone marrow aspiration and biopsy. Her laboratory work has been very stable over the past few months and years. We'll continue to monitor her with labs in 1 year and follow-up in 1 year.   All questions were answered. The patient knows to call the clinic with any problems, questions  or concerns. We can certainly see the patient much sooner if necessary.  Patient and plan discussed with Dr. Farrel Gobble and he is in agreement with the aforementioned.   KEFALAS,THOMAS

## 2013-07-27 ENCOUNTER — Encounter (HOSPITAL_COMMUNITY): Payer: Self-pay | Admitting: Oncology

## 2013-07-27 ENCOUNTER — Encounter (HOSPITAL_COMMUNITY): Payer: Medicare Other | Attending: Oncology | Admitting: Oncology

## 2013-07-27 ENCOUNTER — Encounter (HOSPITAL_COMMUNITY): Payer: Medicare Other

## 2013-07-27 VITALS — BP 119/69 | HR 68 | Temp 98.0°F | Resp 16 | Wt 130.1 lb

## 2013-07-27 DIAGNOSIS — R413 Other amnesia: Secondary | ICD-10-CM | POA: Insufficient documentation

## 2013-07-27 DIAGNOSIS — D61818 Other pancytopenia: Secondary | ICD-10-CM | POA: Insufficient documentation

## 2013-07-27 LAB — CBC WITH DIFFERENTIAL/PLATELET
BASOS ABS: 0 10*3/uL (ref 0.0–0.1)
Basophils Relative: 0 % (ref 0–1)
EOS ABS: 0.1 10*3/uL (ref 0.0–0.7)
Eosinophils Relative: 2 % (ref 0–5)
HCT: 34.2 % — ABNORMAL LOW (ref 36.0–46.0)
Hemoglobin: 11.3 g/dL — ABNORMAL LOW (ref 12.0–15.0)
Lymphocytes Relative: 41 % (ref 12–46)
Lymphs Abs: 1.6 10*3/uL (ref 0.7–4.0)
MCH: 33.5 pg (ref 26.0–34.0)
MCHC: 33 g/dL (ref 30.0–36.0)
MCV: 101.5 fL — ABNORMAL HIGH (ref 78.0–100.0)
MONO ABS: 0.5 10*3/uL (ref 0.1–1.0)
Monocytes Relative: 13 % — ABNORMAL HIGH (ref 3–12)
NEUTROS PCT: 44 % (ref 43–77)
Neutro Abs: 1.6 10*3/uL — ABNORMAL LOW (ref 1.7–7.7)
PLATELETS: 108 10*3/uL — AB (ref 150–400)
RBC: 3.37 MIL/uL — AB (ref 3.87–5.11)
RDW: 11.6 % (ref 11.5–15.5)
WBC: 3.8 10*3/uL — ABNORMAL LOW (ref 4.0–10.5)

## 2013-07-27 NOTE — Patient Instructions (Signed)
[place] Discharge Instructions  RECOMMENDATIONS MADE BY THE CONSULTANT AND ANY TEST RESULTS WILL BE SENT TO YOUR REFERRING PHYSICIAN.  EXAM FINDINGS BY THE PHYSICIAN TODAY AND SIGNS OR SYMPTOMS TO REPORT TO CLINIC OR PRIMARY PHYSICIAN: Exam and findings as discussed by T. Kefalas, PA-C.  INSTRUCTIONS/FOLLOW-UP: 1.  Please return in 1 year for labs and an office visit as scheduled. 2.  Contact the clinic sooner with concerns, fevers, chills, or frequent antibiotic use.  Thank you for choosing [facility] to provide your oncology and hematology care.  To afford each patient quality time with our providers, please arrive at least 15 minutes before your scheduled appointment time.  With your help, our goal is to use those 15 minutes to complete the necessary work-up to ensure our physicians have the information they need to help with your evaluation and healthcare recommendations.    Effective January 1st, 2014, we ask that you re-schedule your appointment with our physicians should you arrive 10 or more minutes late for your appointment.  We strive to give you quality time with our providers, and arriving late affects you and other patients whose appointments are after yours.    Again, thank you for choosing [facility].  Our hope is that these requests will decrease the amount of time that you wait before being seen by our physicians.       _____________________________________________________________  Should you have questions after your visit to [facility], please contact our office at [phone] between the hours of 8:30 a.m. and 5:00 p.m.  Voicemails left after 4:30 p.m. will not be returned until the following business day.  For prescription refill requests, have your pharmacy contact our office with your prescription refill request.

## 2013-07-28 ENCOUNTER — Encounter: Payer: Self-pay | Admitting: Family Medicine

## 2013-07-28 ENCOUNTER — Ambulatory Visit (HOSPITAL_COMMUNITY)
Admission: RE | Admit: 2013-07-28 | Discharge: 2013-07-28 | Disposition: A | Discharge status: Home / Self Care | Payer: Medicare Other | Source: Ambulatory Visit | Attending: Family Medicine | Admitting: Family Medicine

## 2013-07-28 ENCOUNTER — Ambulatory Visit (INDEPENDENT_AMBULATORY_CARE_PROVIDER_SITE_OTHER): Payer: Medicare Other | Admitting: Family Medicine

## 2013-07-28 VITALS — BP 124/76 | HR 60 | Resp 18 | Ht 66.0 in | Wt 129.0 lb

## 2013-07-28 DIAGNOSIS — F411 Generalized anxiety disorder: Secondary | ICD-10-CM | POA: Diagnosis not present

## 2013-07-28 DIAGNOSIS — J301 Allergic rhinitis due to pollen: Secondary | ICD-10-CM

## 2013-07-28 DIAGNOSIS — J309 Allergic rhinitis, unspecified: Secondary | ICD-10-CM | POA: Diagnosis not present

## 2013-07-28 DIAGNOSIS — M79672 Pain in left foot: Secondary | ICD-10-CM

## 2013-07-28 DIAGNOSIS — M7989 Other specified soft tissue disorders: Secondary | ICD-10-CM

## 2013-07-28 DIAGNOSIS — K219 Gastro-esophageal reflux disease without esophagitis: Secondary | ICD-10-CM

## 2013-07-28 DIAGNOSIS — L258 Unspecified contact dermatitis due to other agents: Secondary | ICD-10-CM

## 2013-07-28 DIAGNOSIS — D61818 Other pancytopenia: Secondary | ICD-10-CM

## 2013-07-28 DIAGNOSIS — M79609 Pain in unspecified limb: Secondary | ICD-10-CM | POA: Diagnosis not present

## 2013-07-28 DIAGNOSIS — J302 Other seasonal allergic rhinitis: Secondary | ICD-10-CM

## 2013-07-28 DIAGNOSIS — E785 Hyperlipidemia, unspecified: Secondary | ICD-10-CM

## 2013-07-28 DIAGNOSIS — J45991 Cough variant asthma: Secondary | ICD-10-CM | POA: Insufficient documentation

## 2013-07-28 DIAGNOSIS — K589 Irritable bowel syndrome without diarrhea: Secondary | ICD-10-CM

## 2013-07-28 DIAGNOSIS — L853 Xerosis cutis: Secondary | ICD-10-CM

## 2013-07-28 MED ORDER — CLORAZEPATE DIPOTASSIUM 7.5 MG PO TABS: ORAL_TABLET | ORAL | Status: AC

## 2013-07-28 NOTE — Progress Notes (Signed)
   Subjective:    Patient ID: Kathleen Gallegos, female    DOB: 08/19/1925, 80 y.o.   MRN: [id number]  HPI The PT is here for follow up and re-evaluation of chronic medical conditions, medication management and review of any available recent lab and radiology data.  Preventive health is updated, specifically  Cancer screening and Immunization.   Questions or concerns regarding consultations or procedures which the PT has had in the interim are  Addressed.She has seen both hematology and dermatology and has had good reports from both The patient's daughter and caregiver is concerned re possible excessive sedation and the need to reduce anxiolytic med  Which is very reasonable and certainly welcomed by me, pt also is in agreement C/o left foot pain lateral aspect, no trauma, swelling responded well to short course of low dose lasix    Review of Systems See HPI Denies recent fever or chills. Denies sinus pressure, nasal congestion, ear pain or sore throat. Denies chest congestion, productive cough or wheezing. Denies chest pains, palpitations and leg swelling IBS and chronic constipation and reflux symptoms are well controlled on current meds as long as dietary discretion.   Denies dysuria, frequency, hesitancy or incontinence. Denies  limitation in mobility. Denies headaches, seizures, numbness, or tingling. Denies depression, or uncontrolled  anxiety or insomnia.       Objective:   Physical Exam  Patient alert and oriented and in no cardiopulmonary distress.  HEENT: No facial asymmetry, EOMI, no sinus tenderness,  oropharynx pink and moist.  Neck decreased though adequate ROM,  no adenopathy.  Chest: Clear to auscultation bilaterally.  CVS: S1, S2 no murmurs, no S3.  ABD: Soft non tender. Bowel sounds normal.  Ext: No edema  MS: Adequate ROM spine, shoulders, hips and knees.  Skin: Intact, no ulcerations or rash noted.Very dry skin esp of palms and soles,  unchanged  Psych: Good eye contact, normal affect. Memory impaired not anxious or depressed appearing.  CNS: CN 2-12 intact, power, tone and sensation normal throughout.       Assessment & Plan:

## 2013-07-28 NOTE — Assessment & Plan Note (Signed)
Controlled, no change in medication  

## 2013-07-28 NOTE — Assessment & Plan Note (Signed)
Marked improvement with short course of lasix , essentially resolved

## 2013-07-28 NOTE — Assessment & Plan Note (Signed)
Recent derm eval, no new recommendation pt to use C prep most satisfying to her

## 2013-07-28 NOTE — Assessment & Plan Note (Signed)
deteriorated , needs to reduce fried food intake. Fortunately genetically protected , very high HDL , makes overall risk of CV disease less than 50% general population Still electing not to use statin due to concerns of impact on memory by family

## 2013-07-28 NOTE — Assessment & Plan Note (Signed)
Localized foot pain, no trauma, x ray for reassurance  Due to ongoing concern  Of pt and daughter

## 2013-07-28 NOTE — Patient Instructions (Signed)
F/u in 4.5 month, call if you need me before  Reduce medication for anxiety only take one at bedtime, after two weeks reduce to half at bedtime if possible, cal if you need me before   Please reduce fried and fatty foods , cholesterol is too high   You need and xray of the left foot today.  Glad that you have good reports from hematology and deermatology

## 2013-07-28 NOTE — Assessment & Plan Note (Signed)
Reassuring eval by hematology in the past month with 1 year f/u

## 2013-07-28 NOTE — Assessment & Plan Note (Signed)
Controlled, reduce tranxene dose, pt noted to been excessiveley sleepy/increased dozing in the mornings, hopefully will soon be able to reduce or d/c PM dose also,

## 2013-07-31 ENCOUNTER — Ambulatory Visit (HOSPITAL_COMMUNITY): Payer: Self-pay | Admitting: Psychology

## 2013-08-12 ENCOUNTER — Encounter (HOSPITAL_COMMUNITY): Payer: Self-pay | Admitting: Psychology

## 2013-08-12 ENCOUNTER — Ambulatory Visit (INDEPENDENT_AMBULATORY_CARE_PROVIDER_SITE_OTHER): Payer: Medicare Other | Admitting: Psychology

## 2013-08-12 DIAGNOSIS — F451 Undifferentiated somatoform disorder: Secondary | ICD-10-CM

## 2013-08-12 DIAGNOSIS — F459 Somatoform disorder, unspecified: Secondary | ICD-10-CM

## 2013-08-12 DIAGNOSIS — F411 Generalized anxiety disorder: Secondary | ICD-10-CM | POA: Diagnosis not present

## 2013-08-12 NOTE — Progress Notes (Signed)
Patient:  Kathleen Gallegos   DOB: 1926/07/04  MR Number: [id number]  Location: [facility] [address] [place] [address] Dept: [phone]  Start: 3 PM  End: 4 PM  Provider/Observer:     Edgardo Roys PSYD  Chief Complaint:      Chief Complaint  Patient presents with  . Anxiety  . Stress    Reason For Service:     The patient was referred for psychotherapeutic interventions do to increasing worry and anxiety as well as difficulty adjusting to life changes and aging issues.  Interventions Strategy:  Cognitive/behavioral psychotherapy  Participation Level:   Active  Participation Quality:  Appropriate      Behavioral Observation:  Well Groomed, Alert, and Appropriate.   Current Psychosocial Factors: The patient continues to have some stress with relationship with daughter but it has been better than before.  Patient thinking about selling home and moving to an assisted living situation  Content of Session:   Reviewed current symptoms and continue to work on therapeutic interventions with particular focus on her somatizations disorder.  Current Status:   The patient continues to report not changes in memory function and depression and anxiety all in check and not a big issue.  Some continued aggravation with daughter..   Patient Progress:   Very good  Target Goals:   Target goals include reducing the frequency, intensity, and duration of her hyper focus and hypervigilance on her somatic issues. 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  Last Reviewed:   08/12/2013   Goals Addressed Today:    Today we were primarily with a somatizations issues.  Impression/Diagnosis:   At  this point, the patient does continue to show some significant adjustment difficulties and issues but this does not appear to be anything long-standing. There are no cognitive difficulties or indications of dementia. The patient is in fact relatively healthy. However, there is a continual hyper focus and hypervigilance over her [facility] status and she is continuing to be very concerned about his issues particular her GI functioning.  Diagnosis:    Axis I:  Somatoform disorder  Generalized anxiety disorder      Axis II: No diagnosis

## 2013-09-09 ENCOUNTER — Ambulatory Visit (HOSPITAL_COMMUNITY): Payer: Self-pay | Admitting: Psychology

## 2013-09-10 ENCOUNTER — Ambulatory Visit (HOSPITAL_COMMUNITY): Payer: Self-pay | Admitting: Psychology

## 2013-10-04 ENCOUNTER — Other Ambulatory Visit: Payer: Self-pay | Admitting: Family Medicine

## 2013-10-05 ENCOUNTER — Other Ambulatory Visit: Payer: Self-pay

## 2013-10-05 MED ORDER — BUSPIRONE HCL 5 MG PO TABS: ORAL_TABLET | ORAL | Status: DC

## 2013-10-07 ENCOUNTER — Other Ambulatory Visit (INDEPENDENT_AMBULATORY_CARE_PROVIDER_SITE_OTHER): Payer: Self-pay | Admitting: Otolaryngology

## 2013-10-07 DIAGNOSIS — R221 Localized swelling, mass and lump, neck: Secondary | ICD-10-CM

## 2013-10-13 ENCOUNTER — Telehealth: Payer: Self-pay | Admitting: Family Medicine

## 2013-10-13 ENCOUNTER — Other Ambulatory Visit (INDEPENDENT_AMBULATORY_CARE_PROVIDER_SITE_OTHER): Payer: Self-pay | Admitting: Otolaryngology

## 2013-10-13 ENCOUNTER — Ambulatory Visit (HOSPITAL_COMMUNITY)
Admission: RE | Admit: 2013-10-13 | Discharge: 2013-10-13 | Disposition: A | Discharge status: Home / Self Care | Payer: Medicare Other | Source: Ambulatory Visit | Attending: Otolaryngology | Admitting: Otolaryngology

## 2013-10-13 DIAGNOSIS — R221 Localized swelling, mass and lump, neck: Secondary | ICD-10-CM

## 2013-10-13 DIAGNOSIS — E042 Nontoxic multinodular goiter: Secondary | ICD-10-CM | POA: Insufficient documentation

## 2013-10-13 NOTE — Telephone Encounter (Signed)
Sorry it is Kathleen Gallegos let me fix

## 2013-10-16 ENCOUNTER — Other Ambulatory Visit: Payer: Self-pay | Admitting: Family Medicine

## 2013-10-27 DIAGNOSIS — Z1231 Encounter for screening mammogram for malignant neoplasm of breast: Secondary | ICD-10-CM | POA: Diagnosis not present

## 2013-10-30 ENCOUNTER — Other Ambulatory Visit: Payer: Self-pay | Admitting: Obstetrics & Gynecology

## 2013-10-30 DIAGNOSIS — R928 Other abnormal and inconclusive findings on diagnostic imaging of breast: Secondary | ICD-10-CM

## 2013-11-12 ENCOUNTER — Ambulatory Visit
Admission: RE | Admit: 2013-11-12 | Discharge: 2013-11-12 | Disposition: A | Discharge status: Home / Self Care | Payer: Medicare Other | Source: Ambulatory Visit | Attending: Obstetrics & Gynecology | Admitting: Obstetrics & Gynecology

## 2013-11-12 DIAGNOSIS — R928 Other abnormal and inconclusive findings on diagnostic imaging of breast: Secondary | ICD-10-CM

## 2013-11-26 ENCOUNTER — Ambulatory Visit (INDEPENDENT_AMBULATORY_CARE_PROVIDER_SITE_OTHER): Payer: Medicare Other | Admitting: Family Medicine

## 2013-11-26 ENCOUNTER — Encounter: Payer: Self-pay | Admitting: Family Medicine

## 2013-11-26 VITALS — BP 120/74 | HR 70 | Resp 16 | Wt 130.4 lb

## 2013-11-26 DIAGNOSIS — J309 Allergic rhinitis, unspecified: Secondary | ICD-10-CM | POA: Diagnosis not present

## 2013-11-26 DIAGNOSIS — K219 Gastro-esophageal reflux disease without esophagitis: Secondary | ICD-10-CM

## 2013-11-26 DIAGNOSIS — G4700 Insomnia, unspecified: Secondary | ICD-10-CM

## 2013-11-26 DIAGNOSIS — J302 Other seasonal allergic rhinitis: Secondary | ICD-10-CM

## 2013-11-26 DIAGNOSIS — G3184 Mild cognitive impairment, so stated: Secondary | ICD-10-CM

## 2013-11-26 DIAGNOSIS — J45991 Cough variant asthma: Secondary | ICD-10-CM

## 2013-11-26 DIAGNOSIS — F411 Generalized anxiety disorder: Secondary | ICD-10-CM

## 2013-11-26 DIAGNOSIS — E785 Hyperlipidemia, unspecified: Secondary | ICD-10-CM | POA: Diagnosis not present

## 2013-11-26 DIAGNOSIS — R413 Other amnesia: Secondary | ICD-10-CM

## 2013-11-26 MED ORDER — FLUTICASONE-SALMETEROL 250-50 MCG/DOSE IN AEPB: INHALATION_SPRAY | RESPIRATORY_TRACT | Status: DC

## 2013-11-26 NOTE — Patient Instructions (Signed)
Annual wellness in 4 month, call if you need me before  No changes in medication   No labs due at this  Time

## 2013-11-28 DIAGNOSIS — G3184 Mild cognitive impairment, so stated: Secondary | ICD-10-CM | POA: Insufficient documentation

## 2013-11-28 NOTE — Assessment & Plan Note (Signed)
Controlled, no change in medication  

## 2013-11-28 NOTE — Assessment & Plan Note (Signed)
Controlled, and no longer requires immunotherapy

## 2013-11-28 NOTE — Assessment & Plan Note (Signed)
Daughter , [name] with whom she lives, notes deterioration in her symptoms, with increased forget fullness, also states pt demonstrates suspicion about her intentions , and  Speaks in a derogatory fashion to her often. Open discussion at visit with both parties, suggest that pt spend some time with her other daughters on a rotating basis, of interest Seema thinks that is a good idea, and  [name] becomes somewhat concerned about feeling alone  And missing her Mom, they will work on this however, will benefit all parties

## 2013-11-28 NOTE — Assessment & Plan Note (Signed)
Dietary control only, daughter believes that statin contributed to memory loss and her total to hDL profile is excellent  Update annually Hyperlipidemia:Low fat diet discussed and encouraged.

## 2013-11-28 NOTE — Assessment & Plan Note (Signed)
Off of advair x 4 month with no flare of symptoms, will continue off med for now

## 2013-11-28 NOTE — Progress Notes (Signed)
   Subjective:    Patient ID: Kathleen Gallegos, female    DOB: [birth year], 80 y.o.   MRN: [id number]  HPI The PT is here for follow up and re-evaluation of chronic medical conditions, medication management and review of any available recent lab and radiology data.  Preventive health is updated, specifically  Cancer screening and Immunization.   Questions or concerns regarding consultations or procedures which the PT has had in the interim are  addressed. The PT denies any adverse reactions to current medications since the last visit.  There are no new concerns.  Daughter concerned that Viveka is demonstrating increased forgetfullness with behavioral issues, no interest in medication which she has tried and "failed ' in the past      Review of Systems See HPI Denies recent fever or chills. Denies sinus pressure, nasal congestion, ear pain or sore throat. Denies chest congestion, productive cough or wheezing. Denies chest pains, palpitations and leg swelling Denies abdominal pain, nausea, vomiting,diarrhea or constipation.   Denies dysuria, frequency, hesitancy or incontinence. Denies joint pain, swelling and limitation in mobility. Denies headaches, seizures, numbness, or tingling. Denies depression, uncontrolled  anxiety or insomnia. Denies skin break down or rash.c/o generalized dry skin        Objective:   Physical Exam BP 120/74  Pulse 70  Resp 16  Wt 130 lb 6.4 oz (59.149 kg)  SpO2 98% Patient alert and oriented and in no cardiopulmonary distress.  HEENT: No facial asymmetry, EOMI, no sinus tenderness,  oropharynx pink and moist.  Neck supple no adenopathy.  Chest: Clear to auscultation bilaterally.  CVS: S1, S2 no murmurs, no S3.  ABD: Soft non tender. Bowel sounds normal.  Ext: No edema  MS: Adequate ROM spine, shoulders, hips and knees.  Skin: Intact, no ulcerations or rash noted.  Psych: Good eye contact, normal affect. Memory loss not anxious or  depressed appearing.  CNS: CN 2-12 intact, power, tone and sensation normal throughout.        Assessment & Plan:  Seasonal allergies Controlled, and no longer requires immunotherapy   Cough variant asthma Off of advair x 4 month with no flare of symptoms, will continue off med for now  [place] control only, daughter believes that statin contributed to memory loss and her total to hDL profile is excellent  Update annually Hyperlipidemia:Low fat diet discussed and encouraged.    GENERALIZED ANXIETY DISORDER Controlled, no change in medication   INSOMNIA Sleep hygiene reviewed and written information offered also. Prescription sent for  medication needed.   GERD Controlled, no change in medication   MCI (mild cognitive impairment) with memory loss Daughter , [name] with whom she lives, notes deterioration in her symptoms, with increased forget fullness, also states pt demonstrates suspicion about her intentions , and  Speaks in a derogatory fashion to her often. Open discussion at visit with both parties, suggest that pt spend some time with her other daughters on a rotating basis, of interest Janalynn thinks that is a good idea, and  [name] becomes somewhat concerned about feeling alone  And missing her Mom, they will work on this however, will benefit all parties

## 2013-11-28 NOTE — Assessment & Plan Note (Signed)
Sleep hygiene reviewed and written information offered also. Prescription sent for  medication needed.  

## 2014-03-08 ENCOUNTER — Other Ambulatory Visit: Payer: Self-pay | Admitting: Family Medicine

## 2014-03-30 ENCOUNTER — Telehealth: Payer: Self-pay | Admitting: *Deleted

## 2014-03-30 ENCOUNTER — Other Ambulatory Visit: Payer: Self-pay | Admitting: Family Medicine

## 2014-03-30 DIAGNOSIS — Z961 Presence of intraocular lens: Secondary | ICD-10-CM | POA: Diagnosis not present

## 2014-03-30 NOTE — Telephone Encounter (Signed)
Pt called stating she does not eat fried foods she eats mostly boiled foods and this morning as she was fixing breakfast she had a pain on her left side down to her hip bone, I made pt aware that Dr. Moshe Cipro is on vacation this week and I can send a message to the nurse I advised pt to go to urgent care or the ER if the pain gets worse, pt understood and stated she would like for the nurse to call her back to see if she can get a idea of why she has pain there. Please advise

## 2014-03-30 NOTE — Telephone Encounter (Signed)
Spoke with patient and she states that she has a dull like pain in the mornings when she first gets up that sometimes continues through the day.   She has had no falls.   No numbness or tingling in the leg.   It is her left hip where she primarily feels the pain.  She will try a topical rub to seek relief.

## 2014-04-07 ENCOUNTER — Encounter: Payer: Self-pay | Admitting: Family Medicine

## 2014-04-07 ENCOUNTER — Ambulatory Visit (INDEPENDENT_AMBULATORY_CARE_PROVIDER_SITE_OTHER): Payer: Medicare Other | Admitting: Family Medicine

## 2014-04-07 VITALS — BP 120/72 | HR 70 | Resp 14 | Ht 66.0 in | Wt 132.0 lb

## 2014-04-07 DIAGNOSIS — E785 Hyperlipidemia, unspecified: Secondary | ICD-10-CM

## 2014-04-07 DIAGNOSIS — M1990 Unspecified osteoarthritis, unspecified site: Secondary | ICD-10-CM

## 2014-04-07 DIAGNOSIS — F0390 Unspecified dementia without behavioral disturbance: Secondary | ICD-10-CM

## 2014-04-07 DIAGNOSIS — Z0000 Encounter for general adult medical examination without abnormal findings: Secondary | ICD-10-CM | POA: Diagnosis not present

## 2014-04-07 DIAGNOSIS — Z23 Encounter for immunization: Secondary | ICD-10-CM | POA: Diagnosis not present

## 2014-04-07 DIAGNOSIS — M810 Age-related osteoporosis without current pathological fracture: Secondary | ICD-10-CM

## 2014-04-07 NOTE — Progress Notes (Signed)
Subjective:    Patient ID: Kathleen Gallegos, female    DOB: Aug 21, 1925, 80 y.o.   MRN: [id number]  HPI Preventive Screening-Counseling & Management   Patient present here today for a subsequent Medicare annual wellness visit. Daughter , Kathleen Gallegos  Here with her who she lives with , concerned re Mom's increased outbursts, forgetfullness, inability to keep up with bills , and still resisting allowing her to be in charge of bills/ cheque book , Kathleen Gallegos is concerned that her mother will misplace the cheque book Has questions as to if brainscan can be done ,I explain that since this has been an ongoing and recurrent problem , i feel that geriatric MD involvement will be benficial    Current Problems (verified)   Medications Prior to Visit Allergies (verified)   PAST HISTORY  Family History (verified)   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 No  Over the past two weeks, have you felt little interest or pleasure in doing things? No  Have you lost interest or pleasure in daily life? No  Do you often feel hopeless? No  Do you cry easily over simple problems? No  Activities of Daily Living  In your present state of health, do you have any difficulty performing the following activities?  Driving?: Not since June  Managing money?: Managed by daughter, although per Kathleen Gallegos who is present, often she is not getting the full control needed, to ensure that things run smoothly Feeding yourself?:No Getting from bed to chair?:No Climbing a flight of  stairs?:No Preparing food and eating?:No Bathing or showering?:No Getting dressed?:No Getting to the toilet?:No Using the toilet?:No Moving around from place to place?: No  Fall Risk Assessment In the past year have you fallen or had a near fall?:No Are you currently taking any medications that make you dizzy?:No  Hearing Difficulties: No Do you often ask people to speak up or repeat themselves?:No Do you experience ringing or noises in your ears?:No Do you have difficulty understanding soft or whispered voices?:No  Cognitive Testing  Alert? Yes Normal Appearance?Yes  Oriented to person? Yes Place? Intermittent confusion  Time? Yes  Displays appropriate judgment?Yes  Can read the correct time from a watch face? yes Are you having problems remembering things?Yes, confusion with short term memory   Advanced Directives have been discussed with the patient?Yes and information given , currently a full code   List the Names of Other Physician/Practitioners you currently use: Shapiro-eye, Young-allergist, Neil-GYN, Berry-Cardio, Kefalas-hematology,  Jones-dentist,  Teoh-ENT   Indicate any recent Medical Services you may have received from other than [facility] providers in the past year (date may be approximate).   Assessment:    Annual Wellness Exam   Plan:     Medicare Attestation  I have personally reviewed:  The patient's medical and social history  Their use of alcohol, tobacco or illicit drugs  Their current medications and supplements  The patient's functional ability including ADLs,fall risks, home safety risks, cognitive, and hearing and visual impairment  Diet and physical activities  Evidence for depression or mood disorders  The patient's weight, height, BMI, and visual acuity have been recorded in the chart. I have made referrals, counseling, and provided education to the patient based on review of the above and I have provided the patient with a written personalized  care plan for preventive services.      Review of Systems     Objective:   Physical Exam BP 120/72  Pulse 70  Resp 14  Ht 5\' 6"  (1.676 m)  Wt 132 lb (59.875 kg)  BMI 21.32 kg/m2  SpO2 98%        Assessment & Plan:  Medicare annual wellness visit, subsequent Annual exam as documented. Counseling done  re healthy lifestyle involving commitment to regular exercise ,as able and a heart healthy diet, .The importance of adequate sleep also discussed. Regular seat belt use  is also discussed. . Immunization  needs are specifically addressed at this visit.lu vaccine administered as it is due. The obvious need is for meeting of the family, with a health professional to establish the need for [facility] to hand over her financial and business affairs to her children/in laws, as determined both by herself and the family members. This has been an issue brought up on several occasions by her daughter Kathleen Gallegos who lives with her, and by Kathleen Gallegos her self for at least 5 years. She has had a neuropsych consult with Dr Jefm Miles, in the past and continues to see him for therapy on an as needed basis as the relationship with  Kathleen Gallegos is strained , at times, as is to be expected to some extent, but appears to be out of control in a negative way , too often I will refer Kathleen Gallegos to a geriatric specialist for formal evaluation of her cognitive function and assesment as to her capability of being able to handle her finances etc.I hope that at least 2 if not all 3 of her daughters will be able to be present / involved at that time I hope that this will serve to improve her overall wellbeing .  Need for prophylactic vaccination and inoculation against influenza Vaccine administered at visit.

## 2014-04-07 NOTE — Assessment & Plan Note (Signed)
Vaccine administered at visit.  

## 2014-04-07 NOTE — Patient Instructions (Addendum)
F/u in 4 month, call if you need me before  Fasting lipid, cmp , tSH, CBC  and vit D in 4 month  You are referred to geriatric specialist for further evaluation of  Your ability to be responsible to take care of finaces, safe cookling etc, I recommend STRONGLY that your other children are a part of the visit  Flu vaccine today

## 2014-04-07 NOTE — Assessment & Plan Note (Addendum)
Annual exam as documented. Counseling done  re healthy lifestyle involving commitment to regular exercise ,as able and a heart healthy diet, .The importance of adequate sleep also discussed. Regular seat belt use  is also discussed. . Immunization  needs are specifically addressed at this visit.lu vaccine administered as it is due. The obvious need is for meeting of the family, with a health professional to establish the need for [name] to hand over her financial and business affairs to her children/in laws, as determined both by herself and the family members. This has been an issue brought up on several occasions by her daughter Kathleen Gallegos who lives with her, and by [name] her self for at least 5 years. She has had a neuropsych consult with Dr Jefm Miles, in the past and continues to see him for therapy on an as needed basis as the relationship with  Kathleen Gallegos is strained , at times, as is to be expected to some extent, but appears to be out of control in a negative way , too often I will refer [name] to a geriatric specialist for formal evaluation of her cognitive function and assesment as to her capability of being able to handle her finances etc.I hope that at least 2 if not all 3 of her daughters will be able to be present / involved at that time I hope that this will serve to improve her overall wellbeing .

## 2014-04-12 ENCOUNTER — Telehealth: Payer: Self-pay

## 2014-04-13 NOTE — Telephone Encounter (Signed)
Patient and daughter would like to know if she can discontinue buspirone.  She has not filled med since July.  Please advise.

## 2014-04-13 NOTE — Telephone Encounter (Signed)
Patient is going to discontinue med for now.

## 2014-04-13 NOTE — Telephone Encounter (Signed)
If not taking since July, ps specifically ask if they notice any increased irritability since she stopped the buspar , if this is the case I recommend resuming, if no change noted and they did not think that it was "bothering her" then I recommend resuming

## 2014-04-22 ENCOUNTER — Ambulatory Visit (INDEPENDENT_AMBULATORY_CARE_PROVIDER_SITE_OTHER): Payer: Medicare Other | Admitting: Otolaryngology

## 2014-04-22 DIAGNOSIS — D440 Neoplasm of uncertain behavior of thyroid gland: Secondary | ICD-10-CM

## 2014-04-22 DIAGNOSIS — H6123 Impacted cerumen, bilateral: Secondary | ICD-10-CM | POA: Diagnosis not present

## 2014-04-27 ENCOUNTER — Telehealth: Payer: Self-pay | Admitting: Family Medicine

## 2014-04-27 DIAGNOSIS — F0390 Unspecified dementia without behavioral disturbance: Secondary | ICD-10-CM

## 2014-04-27 NOTE — Telephone Encounter (Signed)
Referral had not been entered. I entered it and Luann is aware and will start working on it

## 2014-05-11 ENCOUNTER — Ambulatory Visit: Payer: Self-pay | Admitting: Cardiovascular Disease

## 2014-05-19 ENCOUNTER — Encounter: Payer: Self-pay | Admitting: Cardiovascular Disease

## 2014-05-19 ENCOUNTER — Ambulatory Visit (INDEPENDENT_AMBULATORY_CARE_PROVIDER_SITE_OTHER): Payer: Medicare Other | Admitting: Cardiovascular Disease

## 2014-05-19 VITALS — BP 126/72 | HR 60 | Ht 65.25 in | Wt 131.5 lb

## 2014-05-19 DIAGNOSIS — R002 Palpitations: Secondary | ICD-10-CM | POA: Diagnosis not present

## 2014-05-19 NOTE — Assessment & Plan Note (Signed)
History of palpitations in the past controlled on low-dose metoprolol. She does not complain of these since I saw her last. Continue current therapy

## 2014-05-19 NOTE — Progress Notes (Signed)
05/19/2014 Kathleen Gallegos   05-29-1926  [id number]  Primary Physician Tula Nakayama, MD Primary Cardiologist: Lorretta Harp MD Renae Gloss   HPI:  The patient is a delightful 80 year old, thin appearing, widowed, African American female mother of 3 children who is accompanied by her oldest daughter today who is her medical power of attorney. I last saw her 12 months ago. She has a history of treated dyslipidemia, irritable bowel syndrome, mild dementia and anxiety. She denies chest pain or shortness of breath. She has had a normal 2D echo and Myoview in the past. Her lipid profile performed 07/21/13 revealed an increase in her LDL from 160 147 as a result of discontinuing her statin drug by her PCP because of concerns about memory loss. Apparently she was taken off her statin by her family because of concerns regarding short-term memory loss  Current Outpatient Prescriptions  Medication Sig Dispense Refill  . aspirin 81 MG tablet Take 81 mg by mouth as needed.     . Calcium Citrate-Vitamin D (CITRACAL + D PO) Take 1 tablet by mouth. 250iu of Vitamin D and 200mg  of Calcium daily    . Carboxymeth-Glycerin-Polysorb (REFRESH OPTIVE ADVANCED OP) Apply 1-2 drops to eye daily as needed.     . Cholecalciferol (VITAMIN D-3) 1000 UNITS CAPS Take 1 capsule by mouth daily.    Marland Kitchen docusate sodium (COLACE) 100 MG capsule Take 100 mg by mouth daily.     Marland Kitchen EPINEPHrine (EPI-PEN) 0.3 mg/0.3 mL SOAJ injection Inject 0.3 mLs (0.3 mg total) into the muscle once. 1 Device 2  . furosemide (LASIX) 20 MG tablet TAKE ONE TABLET BY MOUTH ONCE DAILY FOR  1  WEEK  THEN  AS  NEEDED  ONLY 10 tablet 0  . KLOR-CON M20 20 MEQ tablet TAKE ONE TABLET BY MOUTH TWICE DAILY 20 tablet 0  . Lactobacillus Rhamnosus, GG, (CULTURELLE PO) Take 1 capsule by mouth daily.     . metoprolol succinate (TOPROL-XL) 25 MG 24 hr tablet TAKE ONE-HALF TABLET BY MOUTH TWICE DAILY 30 tablet 3  . Omega-3 Fatty Acids (OMEGA 3 PO)  Take by mouth.    Marland Kitchen omeprazole (PRILOSEC OTC) 20 MG tablet Take 20 mg by mouth daily before breakfast.     . polyethylene glycol powder (MIRALAX) powder Take 8.5-17 g by mouth 2 (two) times daily. Take half dose (8.5g)  in the morning and 17 g at night     No current facility-administered medications for this visit.    Allergies  Allergen Reactions  . Diphenhydramine Hcl Other (See Comments)    unknown  . Rivastigmine Tartrate Palpitations    History   Social History  . Marital Status: Widowed    Spouse Name: N/A    Number of Children: 3  . Years of Education: N/A   Occupational History  . retired    Social History Main Topics  . Smoking status: Never Smoker   . Smokeless tobacco: Never Used  . Alcohol Use: No  . Drug Use: No  . Sexual Activity: No   Other Topics Concern  . Not on file   Social History Narrative     Review of Systems: General: negative for chills, fever, night sweats or weight changes.  Cardiovascular: negative for chest pain, dyspnea on exertion, edema, orthopnea, palpitations, paroxysmal nocturnal dyspnea or shortness of breath Dermatological: negative for rash Respiratory: negative for cough or wheezing Urologic: negative for hematuria Abdominal: negative for nausea, vomiting, diarrhea, bright red blood  per rectum, melena, or hematemesis Neurologic: negative for visual changes, syncope, or dizziness All other systems reviewed and are otherwise negative except as noted above.    Blood pressure 126/72, pulse 60, height 5' 5.25" (1.657 m), weight 131 lb 8 oz (59.648 kg).  General appearance: alert and no distress Neck: no adenopathy, no carotid bruit, no JVD, supple, symmetrical, trachea midline and thyroid not enlarged, symmetric, no tenderness/mass/nodules Lungs: clear to auscultation bilaterally Heart: regular rate and rhythm, S1, S2 normal, no murmur, click, rub or gallop Extremities: extremities normal, atraumatic, no cyanosis or  edema  EKG normal sinus rhythm at 60 without ST or T-wave changes. I personally reviewed this EKG  ASSESSMENT AND PLAN:   HYPERLIPIDEMIA History of hyperlipidemia with recent lipid profile performed 07/21/13 revealed a total cholesterol 258, LDL of 147 and HDL of 102. She is not on a statin drug.  PALPITATIONS History of palpitations in the past controlled on low-dose metoprolol. She does not complain of these since I saw her last. Continue current therapy      Lorretta Harp MD [facility], [facility] 05/19/2014 2:09 PM

## 2014-05-19 NOTE — Patient Instructions (Signed)
Your physician wants you to follow-up in 1 year with Dr. Berry. You will receive a reminder letter in the mail 2 months in advance. If you do not receive a letter, please call our office to schedule the follow-up appointment.  

## 2014-05-19 NOTE — Assessment & Plan Note (Signed)
History of hyperlipidemia with recent lipid profile performed 07/21/13 revealed a total cholesterol 258, LDL of 147 and HDL of 102. She is not on a statin drug.

## 2014-05-21 ENCOUNTER — Encounter: Payer: Self-pay | Admitting: Internal Medicine

## 2014-05-26 ENCOUNTER — Ambulatory Visit: Payer: Self-pay | Admitting: Internal Medicine

## 2014-06-04 ENCOUNTER — Ambulatory Visit: Payer: Self-pay | Admitting: Internal Medicine

## 2014-06-15 ENCOUNTER — Ambulatory Visit: Payer: Medicare Other | Admitting: Internal Medicine

## 2014-06-15 ENCOUNTER — Encounter: Payer: Self-pay | Admitting: Internal Medicine

## 2014-06-15 VITALS — BP 134/78 | HR 62 | Ht 65.0 in | Wt 132.0 lb

## 2014-06-15 DIAGNOSIS — J45998 Other asthma: Secondary | ICD-10-CM

## 2014-06-15 NOTE — Patient Instructions (Addendum)
Ok to try otc nasal Flonase/ fluticasone 1-2 puffs each nostril once daily at bedtime.Try this for allergy.

## 2014-06-15 NOTE — Progress Notes (Signed)
Patient ID: Kathleen Gallegos, female    DOB: [birth year], 80 y.o.   MRN: [id number]  HPI 03/27/2011-80 year old female never smoker followed for asthma, allergic rhinitis Last here 07/24/2010-note reviewed She notices persistent left nostril stuffiness in the mornings with watery rhinorrhea but no blood or headache. This has not gotten worse in the fall weather. She continues allergy shots, believes they help her. Denies wheeze cough or shortness of breath and has not been using her rescue inhaler.  06/08/12--80 year old female never smoker followed for asthma, allergic rhinitis No significant respiratory infections this winter. Her primary physician gave Cipro for urinary infection ending yesterday. This also improved her sinus drainage which is noted mostly when she is lying down. She denies cough and says her chest feels clear.  11/07/11- 80 year old female never smoker followed for asthma, allergic rhinitis   PCP Dr Moshe Cipro Family member here Scratchy throat with increased pollen; nasal drainage every morning-white in color. Nasal congestion. CT chest 10/09/2011 was reviewed with them in detail:. Dr. Moshe Cipro had been good enough to send a note directing attention to her left lower lobe nodule. A thyroid nodule was also seen and biopsied "benign". IMPRESSION:  1. Left lower lobe nodule measures slightly larger than the  examination from 2008. Hounsfield unit measurements from this  nodule are similar to the thoracic aorta and nearby pulmonary  artery and veins. The differential diagnosis for this nodule  includes pulmonary AVM, slow-growing pulmonary neoplasm, or  impacted distal bronchial containing inspissated mucous.  Management strategies include follow-up imaging  to document stability. A contrast-enhanced CT of the chest and 3  months would be recommended in this case. Alternatively,  endobronchial ultrasound may be helpful for further evaluation.  This nodule may be too small to  biopsy percutaneously and is below  the size threshold for PET CT.  2. The other small nodules are stable when compared with previous  imaging.  3. Increase in size of right lobe of thyroid gland nodule.  Suggest further evaluation with thyroid sonography.  Original Report Authenticated By: Angelita Ingles, M.D.   02/15/12- 80 year old female never smoker followed for asthma, allergic rhinitis, lung nodule   PCP Dr Moshe Cipro Scheduled 6 MWT was cancelled due to late arrival. Here with daughter   says breathing has been good and she can walk 2 miles a day. Blames dust and mold for hoarseness all summer. CT chest - 02/14/12 reviewed with them. IMPRESSION:  9 x 7 mm left lower lobe pulmonary opacity, favored to reflect  inspissated mucus within a bronchocele. Given only minimally  increase in size when compared to 2009, this appearance is almost  certainly benign.  If additional follow-up imaging is desired, unenhanced CT is  recommended to confirm the opacity is intrinsically hyperdense  rather than enhancing.  Original Report Authenticated By: Julian Hy, M.D.   05/29/13- 80 year old female never smoker followed for asthma, allergic rhinitis, lung nodule   PCP Dr Moshe Cipro FOLLOWS FOR:  Breathing doing well.  Having PND and some mucus, having to clear throat  doing pretty well but admits some postnasal drip. Denies routine cough or chest tightness. Did cough up a slight streak of blood at least once. Denies night sweats, fever, adenopathy. She had been getting allergy vaccine but was irregular with her shots. We discussed likelihood at her age that they were no longer helpful.  06/15/14- 80 year old female never smoker followed for asthma, allergic rhinitis, lung nodule   PCP Dr Moshe Cipro FOLLOWS FOR: pt c/o scratchy  throat since leaving house today.  Has been off allergy injections since last October.    CXR 05/29/13 IMPRESSION: No evidence of acute cardiopulmonary  disease. Electronically Signed  By: Julian Hy M.D.  On: 05/29/2013 12:45   Review of Systems-see HPI   Constitutional:   No-   weight loss, night sweats, fevers, chills, fatigue, lassitude. HEENT:   No-  headaches, difficulty swallowing, tooth/dental problems, sore throat,       No-  sneezing, itching, ear ache,  +nasal congestion, post nasal drip, hoarseness CV:  No-   chest pain, orthopnea, PND, swelling in lower extremities, anasarca, dizziness, palpitations Resp: + shortness of breath with exertion or at rest.              No-   productive cough,  No non-productive cough,  Once- coughing up of blood.              No-   change in color of mucus.  No- wheezing.   Skin: No-   rash or lesions. GI:  No-   heartburn, indigestion, abdominal pain, nausea, vomiting,  GU: MS:  No-   joint pain or swelling.   Neuro-  Psych:  No- change in mood or affect. No depression or anxiety.  No memory loss.  OBJ- General- Alert, Oriented, Affect-appropriate, Distress- none acute, comfortable-appearing elderly woman, talkative Skin- rash-none, lesions- none, excoriation- none Lymphadenopathy- none Head- atraumatic            Eyes- Gross vision intact, PERRLA, conjunctivae clear secretions            Ears- Hearing, canals normal for age            Nose- Clear, no-Septal dev, mucus, polyps, erosion, perforation             Throat- Mallampati II , mucosa clear , drainage- none, tonsils- atrophic, missing teeth Neck- flexible , trachea midline, no stridor , thyroid nl, carotid no bruit Chest - symmetrical excursion , unlabored           Heart/CV- RRR , no murmur , no gallop  , no rub, nl s1 s2                           - JVD- none , edema- none, stasis changes- none, varices- none           Lung-  +Distant but clear to P&A, wheeze- none, cough- none , dullness-none, rub- none           Chest wall-  Abd-  Br/ Gen/ Rectal- Not done, not indicated Extrem- cyanosis- none, clubbing, none,  atrophy- none, strength- nl Neuro- grossly intact to observation

## 2014-06-29 ENCOUNTER — Telehealth: Payer: Self-pay | Admitting: Internal Medicine

## 2014-06-29 NOTE — Telephone Encounter (Signed)
LMTCB

## 2014-06-30 MED ORDER — FLUTICASONE PROPIONATE 50 MCG/ACT NA SUSP: 1.0000 | Freq: Every day | NASAL | Status: DC

## 2014-06-30 NOTE — Telephone Encounter (Signed)
I spoke with pt daughter and she is asking for an rx be sent for the flonase so she can price compare. Rx sent. Stateline Bing, CMA

## 2014-07-06 ENCOUNTER — Other Ambulatory Visit: Payer: Self-pay | Admitting: Obstetrics & Gynecology

## 2014-07-06 DIAGNOSIS — Z124 Encounter for screening for malignant neoplasm of cervix: Secondary | ICD-10-CM | POA: Diagnosis not present

## 2014-07-06 DIAGNOSIS — Z1272 Encounter for screening for malignant neoplasm of vagina: Secondary | ICD-10-CM | POA: Diagnosis not present

## 2014-07-07 ENCOUNTER — Ambulatory Visit: Payer: Self-pay | Admitting: Internal Medicine

## 2014-07-07 ENCOUNTER — Other Ambulatory Visit: Payer: Self-pay | Admitting: Obstetrics & Gynecology

## 2014-07-07 DIAGNOSIS — R921 Mammographic calcification found on diagnostic imaging of breast: Secondary | ICD-10-CM

## 2014-07-07 LAB — CYTOLOGY - PAP

## 2014-07-19 ENCOUNTER — Inpatient Hospital Stay: Admission: RE | Admit: 2014-07-19 | Payer: Self-pay | Source: Ambulatory Visit

## 2014-07-25 NOTE — Assessment & Plan Note (Addendum)
Intermittent pancytopenia since at least 2008 thus far without change.  Stable.  No need for bone marrow aspiration and biopsy to evaluate for MDS given patient's age and co-morbidities.  No need for ESA therapy at this time based upon lab values.  Labs today: CBC diff, CMET, anemia panle, LDH, and retic count.  Return in 12 months for follow-up.

## 2014-07-25 NOTE — Progress Notes (Signed)
Tula Nakayama, MD [address] [place] [address]  Pancytopenia - Plan: Reticulocytes, Lactate dehydrogenase, Vitamin B12, Folate, Iron and TIBC, Ferritin  Anemia, unspecified anemia type - Plan: Reticulocytes, Lactate dehydrogenase, Vitamin B12, Folate, Iron and TIBC, Ferritin  CURRENT THERAPY: Surveillance  INTERVAL HISTORY: DELSY ETZKORN 81 y.o. female returns for followup of intermittent pancytopenia dating back to at least 2008 without any changes.   I personally reviewed and went over laboratory results with the patient.  The results are noted within this dictation.  I personally reviewed and went over radiographic studies with the patient.  The results are noted within this dictation.  Mammogram in May 2015 was BIRADS 3.  Chart is reviewed.   She denies any recurrent infections.  She denies any b symptoms.   Her biggest complaint is short term memory loss.  She notes that she is forgetful at times about minor things.   I will defer this to her primary care provider.  She still provides her own self care and cooks. She is no longer driving which is probably in the patient's best interest.  She continues to walk and ambulate without difficulty.  Hematologically she denies any complaints and ROS questioning is negative.  Past Medical History  Diagnosis Date  . IBS (irritable bowel syndrome)   . Anxiety disorder   . Hyperlipidemia   . Lung nodule   . Angioneurotic edema not elsewhere classified   . Allergic rhinitis, cause unspecified   . Asthma   . Carpal tunnel syndrome   . Chronic abdominal pain   . GERD (gastroesophageal reflux disease)   . Insomnia   . Palpitations   . Osteoarthritis   . Senile osteoporosis   . Fecal incontinence   . Carotid bruit   . Pancytopenia     has HYPERLIPIDEMIA; Presenile dementia, uncomplicated; GENERALIZED ANXIETY DISORDER; CARPAL TUNNEL SYNDROME; UNSPECIFIED PERIPHERAL VERTIGO; TRANSIENT ISCHEMIC  ATTACK; HEMORRHOIDS, WITH BLEEDING; Allergic rhinitis due to pollen; Allergic-infective asthma; LUNG NODULE; GERD; IRRITABLE BOWEL SYNDROME; CONTACT DERMATITIS&OTHER ECZEMA DUE UNSPEC CAUSE; OSTEOARTHRITIS; SENILE OSTEOPOROSIS; INSOMNIA; FATIGUE; PALPITATIONS; FECAL INCONTINENCE; ANGIOEDEMA; Pancytopenia; Seasonal allergies; Dry skin dermatitis; Cough variant asthma; MCI (mild cognitive impairment) with memory loss; Medicare annual wellness visit, subsequent; and Need for prophylactic vaccination and inoculation against influenza on her problem list.     is allergic to diphenhydramine hcl and rivastigmine tartrate.  Ms. Petraglia does not currently have medications on file.  Past Surgical History  Procedure Laterality Date  . Abdominal hysterectomy  1979    fibroids   . Colonoscopy  12/21/04    normal -redundant   . Biopsy thyroid  1970   . Bilateral cataract surgery  2008  . Eus  08/07/07  . Eye surgery      Denies any headaches, dizziness, double vision, fevers, chills, night sweats, nausea, vomiting, diarrhea, constipation, chest pain, heart palpitations, shortness of breath, blood in stool, black tarry stool, urinary pain, urinary burning, urinary frequency, hematuria.   PHYSICAL EXAMINATION  ECOG PERFORMANCE STATUS: 0 - Asymptomatic  Filed Vitals:   07/27/14 1353  BP: 138/61  Pulse: 77  Temp: 98.6 F (37 C)  Resp: 16    GENERAL:alert, no distress, well nourished, well developed, comfortable, cooperative and smiling SKIN: skin color, texture, turgor are normal, no rashes or significant lesions HEAD: Normocephalic, No masses, lesions, tenderness or abnormalities EYES: normal, PERRLA, EOMI, Conjunctiva are pink and non-injected EARS: External ears normal OROPHARYNX:mucous membranes are moist  NECK: supple, no adenopathy, thyroid normal size, non-tender, without nodularity, no stridor, non-tender, trachea midline LYMPH:  no palpable lymphadenopathy, no  hepatosplenomegaly BREAST:not examined LUNGS: clear to auscultation and percussion HEART: regular rate & rhythm, no murmurs, no gallops, S1 normal and S2 normal ABDOMEN:abdomen soft, non-tender, normal bowel sounds, no masses or organomegaly and no hepatosplenomegaly BACK: Back symmetric, no curvature., No CVA tenderness EXTREMITIES:less then 2 second capillary refill, no joint deformities, effusion, or inflammation, no edema, no skin discoloration, no clubbing, no cyanosis, positive findings:  Hand joint deformities from arthritis.  NEURO: alert & oriented x 3 with fluent speech, no focal motor/sensory deficits, gait normal   LABORATORY DATA: CBC    Component Value Date/Time   WBC 3.8* 07/27/2013 1335   RBC 3.37* 07/27/2013 1335   HGB 11.3* 07/27/2013 1335   HCT 34.2* 07/27/2013 1335   PLT 108* 07/27/2013 1335   MCV 101.5* 07/27/2013 1335   MCH 33.5 07/27/2013 1335   MCHC 33.0 07/27/2013 1335   RDW 11.6 07/27/2013 1335   LYMPHSABS 1.6 07/27/2013 1335   MONOABS 0.5 07/27/2013 1335   EOSABS 0.1 07/27/2013 1335   BASOSABS 0.0 07/27/2013 1335      Chemistry      Component Value Date/Time   NA 139 07/21/2013 0842   K 4.1 07/21/2013 0842   CL 101 07/21/2013 0842   CO2 30 07/21/2013 0842   BUN 16 07/21/2013 0842   CREATININE 0.95 07/21/2013 0842   CREATININE 0.85 12/27/2012 0929      Component Value Date/Time   CALCIUM 9.3 07/21/2013 0842   ALKPHOS 28* 05/05/2012 0850   AST 24 05/05/2012 0850   ALT 18 05/05/2012 0850   BILITOT 0.6 05/05/2012 0850       RADIOGRAPHIC STUDIES:  11/12/2013  CLINICAL DATA: Screening callback for questioned left calcifications of the upper-outer quadrant  EXAM: DIGITAL DIAGNOSTIC left MAMMOGRAM  COMPARISON: Prior exams  ACR Breast Density Category b: There are scattered areas of fibroglandular density.  FINDINGS: Additional views confirm the presence of calcifications in the left upper outer quadrant adjacent to stable  coarse calcifications, with suggestion of rim like morphology which could be associated with a cyst, or possibly involuting fibroadenoma.  IMPRESSION: Probably benign left upper outer quadrant calcifications.  RECOMMENDATION: Six-month follow-up left diagnostic mammogram is recommended.  I have discussed the findings and recommendations with the patient. Results were also provided in writing at the conclusion of the visit. If applicable, a reminder letter will be sent to the patient regarding the next appointment.  BI-RADS CATEGORY 3: Probably benign.   Electronically Signed  By: Conchita Paris M.D.  On: 11/12/2013 15:42    ASSESSMENT AND PLAN:  Pancytopenia Intermittent pancytopenia since at least 2008 thus far without change.  Stable.  No need for bone marrow aspiration and biopsy to evaluate for MDS given patient's age and co-morbidities.  No need for ESA therapy at this time based upon lab values.  Labs today: CBC diff, CMET, anemia panle, LDh, and retic count.  Return in 12 months for follow-up.    THERAPY PLAN:  Labs are stable.  There is no need to aggressively intervene with this patient given her age and co-morbidities.  CBC is stable and values are in a very safe range.    All questions were answered. The patient knows to call the clinic with any problems, questions or concerns. We can certainly see the patient much sooner if necessary.  Patient and plan discussed with Dr. Ancil Linsey and  she is in agreement with the aforementioned.   KEFALAS,THOMAS 07/27/2014

## 2014-07-27 ENCOUNTER — Encounter (HOSPITAL_COMMUNITY): Payer: Self-pay | Admitting: Oncology

## 2014-07-27 ENCOUNTER — Encounter (HOSPITAL_COMMUNITY): Payer: Medicare Other | Attending: Oncology | Admitting: Oncology

## 2014-07-27 ENCOUNTER — Other Ambulatory Visit (HOSPITAL_COMMUNITY): Payer: Self-pay

## 2014-07-27 ENCOUNTER — Ambulatory Visit (HOSPITAL_COMMUNITY): Payer: Self-pay | Admitting: Oncology

## 2014-07-27 ENCOUNTER — Encounter (HOSPITAL_COMMUNITY): Payer: Medicare Other

## 2014-07-27 VITALS — BP 138/61 | HR 77 | Temp 98.6°F | Resp 16 | Wt 133.2 lb

## 2014-07-27 DIAGNOSIS — M810 Age-related osteoporosis without current pathological fracture: Secondary | ICD-10-CM | POA: Diagnosis not present

## 2014-07-27 DIAGNOSIS — E785 Hyperlipidemia, unspecified: Secondary | ICD-10-CM | POA: Diagnosis not present

## 2014-07-27 DIAGNOSIS — D61818 Other pancytopenia: Secondary | ICD-10-CM

## 2014-07-27 DIAGNOSIS — D649 Anemia, unspecified: Secondary | ICD-10-CM | POA: Diagnosis present

## 2014-07-27 DIAGNOSIS — K219 Gastro-esophageal reflux disease without esophagitis: Secondary | ICD-10-CM | POA: Insufficient documentation

## 2014-07-27 DIAGNOSIS — J45909 Unspecified asthma, uncomplicated: Secondary | ICD-10-CM | POA: Diagnosis not present

## 2014-07-27 DIAGNOSIS — K589 Irritable bowel syndrome without diarrhea: Secondary | ICD-10-CM | POA: Insufficient documentation

## 2014-07-27 DIAGNOSIS — R413 Other amnesia: Secondary | ICD-10-CM | POA: Insufficient documentation

## 2014-07-27 LAB — CBC WITH DIFFERENTIAL/PLATELET
Basophils Absolute: 0 10*3/uL (ref 0.0–0.1)
Basophils Relative: 0 % (ref 0–1)
Eosinophils Absolute: 0.1 10*3/uL (ref 0.0–0.7)
Eosinophils Relative: 1 % (ref 0–5)
HCT: 34.4 % — ABNORMAL LOW (ref 36.0–46.0)
Hemoglobin: 11.6 g/dL — ABNORMAL LOW (ref 12.0–15.0)
LYMPHS PCT: 36 % (ref 12–46)
Lymphs Abs: 1.5 10*3/uL (ref 0.7–4.0)
MCH: 34.4 pg — ABNORMAL HIGH (ref 26.0–34.0)
MCHC: 33.7 g/dL (ref 30.0–36.0)
MCV: 102.1 fL — ABNORMAL HIGH (ref 78.0–100.0)
MONOS PCT: 10 % (ref 3–12)
Monocytes Absolute: 0.4 10*3/uL (ref 0.1–1.0)
Neutro Abs: 2.2 10*3/uL (ref 1.7–7.7)
Neutrophils Relative %: 53 % (ref 43–77)
PLATELETS: 128 10*3/uL — AB (ref 150–400)
RBC: 3.37 MIL/uL — AB (ref 3.87–5.11)
RDW: 12.8 % (ref 11.5–15.5)
WBC: 4.3 10*3/uL (ref 4.0–10.5)

## 2014-07-27 LAB — RETICULOCYTES
RBC.: 3.37 MIL/uL — AB (ref 3.87–5.11)
RETIC CT PCT: 1 % (ref 0.4–3.1)
Retic Count, Absolute: 33.7 10*3/uL (ref 19.0–186.0)

## 2014-07-27 LAB — LACTATE DEHYDROGENASE: LDH: 221 U/L (ref 94–250)

## 2014-07-27 LAB — IRON AND TIBC
Iron: 61 ug/dL (ref 42–145)
SATURATION RATIOS: 20 % (ref 20–55)
TIBC: 310 ug/dL (ref 250–470)
UIBC: 249 ug/dL (ref 125–400)

## 2014-07-27 NOTE — Patient Instructions (Signed)
[facility]  Discharge Instructions:  We will update labs today. Return in 12 months for follow-up _______________________________________________________________  Thank you for choosing [facility] at [facility] to provide your oncology and hematology care.  To afford each patient quality time with our providers, please arrive at least 15 minutes before your scheduled appointment.  You need to re-schedule your appointment if you arrive 10 or more minutes late.  We strive to give you quality time with our providers, and arriving late affects you and other patients whose appointments are after yours.  Also, if you no show three or more times for appointments you may be dismissed from the clinic.  Again, thank you for choosing [facility] [place] [facility] hope is that these requests will allow you access to exceptional care and in a timely manner. _______________________________________________________________  If you have questions after your visit, please contact our office at [phone] between the hours of 8:30 a.m. and 5:00 p.m. Voicemails left after 4:30 p.m. will not be returned until the following business day. _______________________________________________________________  For prescription refill requests, have your pharmacy contact our office. _______________________________________________________________  Recommendations made by the consultant and any test results will be sent to your referring physician. _______________________________________________________________

## 2014-07-28 LAB — VITAMIN B12: Vitamin B-12: 869 pg/mL (ref 211–911)

## 2014-07-28 LAB — FERRITIN: FERRITIN: 137 ng/mL (ref 10–291)

## 2014-07-28 LAB — FOLATE: Folate: >20 ng/mL

## 2014-08-01 ENCOUNTER — Other Ambulatory Visit: Payer: Self-pay | Admitting: Family Medicine

## 2014-08-03 ENCOUNTER — Ambulatory Visit
Admission: RE | Admit: 2014-08-03 | Discharge: 2014-08-03 | Disposition: A | Discharge status: Home / Self Care | Payer: Medicare Other | Source: Ambulatory Visit | Attending: Obstetrics & Gynecology | Admitting: Obstetrics & Gynecology

## 2014-08-03 ENCOUNTER — Ambulatory Visit: Payer: Self-pay | Admitting: Internal Medicine

## 2014-08-03 DIAGNOSIS — R921 Mammographic calcification found on diagnostic imaging of breast: Secondary | ICD-10-CM

## 2014-08-05 ENCOUNTER — Encounter: Payer: Self-pay | Admitting: Family Medicine

## 2014-08-05 ENCOUNTER — Ambulatory Visit (INDEPENDENT_AMBULATORY_CARE_PROVIDER_SITE_OTHER): Payer: Medicare Other | Admitting: Family Medicine

## 2014-08-05 VITALS — BP 114/72 | HR 72 | Resp 18 | Ht 66.0 in | Wt 132.1 lb

## 2014-08-05 DIAGNOSIS — G3184 Mild cognitive impairment, so stated: Secondary | ICD-10-CM

## 2014-08-05 DIAGNOSIS — M810 Age-related osteoporosis without current pathological fracture: Secondary | ICD-10-CM

## 2014-08-05 DIAGNOSIS — Z23 Encounter for immunization: Secondary | ICD-10-CM | POA: Diagnosis not present

## 2014-08-05 DIAGNOSIS — E785 Hyperlipidemia, unspecified: Secondary | ICD-10-CM

## 2014-08-05 DIAGNOSIS — M8580 Other specified disorders of bone density and structure, unspecified site: Secondary | ICD-10-CM | POA: Diagnosis not present

## 2014-08-05 DIAGNOSIS — F064 Anxiety disorder due to known physiological condition: Secondary | ICD-10-CM

## 2014-08-05 DIAGNOSIS — D61818 Other pancytopenia: Secondary | ICD-10-CM

## 2014-08-05 DIAGNOSIS — F5105 Insomnia due to other mental disorder: Secondary | ICD-10-CM

## 2014-08-05 DIAGNOSIS — F419 Anxiety disorder, unspecified: Secondary | ICD-10-CM

## 2014-08-05 DIAGNOSIS — R002 Palpitations: Secondary | ICD-10-CM

## 2014-08-05 DIAGNOSIS — F411 Generalized anxiety disorder: Secondary | ICD-10-CM

## 2014-08-05 DIAGNOSIS — M25472 Effusion, left ankle: Secondary | ICD-10-CM | POA: Diagnosis not present

## 2014-08-05 MED ORDER — CLORAZEPATE DIPOTASSIUM 3.75 MG PO TABS: 3.7500 mg | ORAL_TABLET | Freq: Every evening | ORAL | Status: DC | PRN

## 2014-08-05 MED ORDER — FUROSEMIDE 20 MG PO TABS: ORAL_TABLET | ORAL | Status: DC

## 2014-08-05 NOTE — Patient Instructions (Addendum)
F/u in 4 month, please call if you  Need me before  Thankful that you are doing well  NEW DIRECTIONS for both the lasix and the tranxene, the tranxene tablet dose has been reduced to half the strength, so you may not need to break this  Pls sign for your most recent dexa from your gynecologist  Pls sign for records to be sent to the geriatric specialist referred to for help with memory assesment and best management options  Fasting labs, nothing but water for 10 hrs please  Prevnar today

## 2014-08-05 NOTE — Progress Notes (Signed)
Subjective:    Patient ID: Kathleen Gallegos, female    DOB: 11-May-1926, 81 y.o.   MRN: [id number]  HPI The PT is here for follow up and re-evaluation of chronic medical conditions, medication management and review of any available recent lab and radiology data.  Preventive health is updated, specifically  Cancer screening and Immunization.  Prevnar administered Dexa is UTD , has osteopenia, followed by gyne Questions or concerns regarding consultations or procedures which the PT has had in the interim are  Addressed.She has seen cardiology, hematology, pulmnary and gyne since last visit, and has an upcoming appt with geriatrics Daughter states that recently was prescribed topical estrogen for urethral erythema by gyne, I have advised her to continue to follow with gyne about this . The PT denies any adverse reactions to current medications since the last visit.  There are no new concerns.  There are no specific complaints       Review of Systems See HPI Denies recent fever or chills. Denies sinus pressure, nasal congestion, ear pain or sore throat. Denies chest congestion, productive cough or wheezing. Denies chest pains, palpitations , PND or orthopnea. Has intermittent left ankle swelling and will use 10 lasix tabs in 3 months as needed, directions have changed to 2 initially then as needed, potassium to be taken with this Denies abdominal pain, nausea, vomiting,diarrhea or constipation.   Denies dysuria, frequency, hesitancy or incontinence. Denies significant  joint pain, swelling and limitation in mobility. Denies headaches, seizures, numbness, or tingling. Denies depression, anxiety or insomnia.Uses medication to help with sleep, and has not seen therapist for some time re GAD Denies skin break down or rash.        Objective:   Physical Exam BP 114/72 mmHg  Pulse 72  Resp 18  Ht 5\' 6"  (1.676 m)  Wt 132 lb 1.9 oz (59.929 kg)  BMI 21.33 kg/m2  SpO2 100% Patient  alert and oriented and in no cardiopulmonary distress.  HEENT: No facial asymmetry, EOMI,   oropharynx pink and moist.  Neck supple no JVD, no mass.  Chest: Clear to auscultation bilaterally.  CVS: S1, S2 no murmurs, no S3.Regular rate.  ABD: Soft non tender.   Ext: No edema  Kathleen: Adequate ROM spine, shoulders, hips and knees.  Skin: Intact, no ulcerations or rash noted.  Psych: Good eye contact, normal affect. Memory mildly impaired not anxious or depressed appearing.  CNS: CN 2-12 intact, power,  normal throughout.no focal deficits noted.        Assessment & Plan:  PALPITATIONS Asymptomatic , rate controlled on current medication, has had annual cardiology eval in past 4 month, no change in mangement   Hyperlipemia Hyperlipidemia:Low fat diet discussed and encouraged.  Updated lab needed  Pt and daughter have elected to avoid statin therapy due to concerns re current memory loss an potential worsening on medication     Pancytopenia Noted to be stable over the years by hematology, annual f/u planned    GENERALIZED ANXIETY DISORDER Stable and pt exhibiting no anxiety at visit. Has not needed to see therapist for past several months   Hyposomnia, insomnia or sleeplessness associated with anxiety Reports using "a piece" of the med prescribed , and not certain that she is using every night I encouraged her to rely increasingly less on a sleep aid , and to practice good sleep hygiene Med dose reduced   OSTEOARTHRITIS No significant debility reported, no h/o falls or limitation in mobility   MCI (  mild cognitive impairment) with memory loss Upcoming consultation with geriatric specialist hopefully with patient's 3 daughters present.longstanding concerns re property and where Kathleen Gallegos will live if she leaves her current home where she lives with one of her 3 daughters, Kathleen Gallegos , who lives with her has voiced  increased concerns re her Mother's  ability to live independently Essentially a lot of concern re forget fullness, most of which seems age appropriatei, however, expressed concern re late bill payment and Kathleen Gallegos's resistance to enabling Kathleen Gallegos to control her finances  I have tried in the past to encourage Kathleen Gallegos to allow Kathleen Gallegos to take over day to day finances , so that things remain in order, she has been somewhat reluctant/resistant, and input from a geriatric specialist , with her other daughters present , in the hope that current challenges can be addressed with all involved parties present. This has not happened to date   Need for vaccination with 13-polyvalent pneumococcal conjugate vaccine Vaccine administered at visit.    Left ankle swelling Intermittent left ankle swelling, mild, not related to heart or renal failure Judicious use of lasix and potassium, ten tabs to last 12 to 16 weeks

## 2014-08-06 DIAGNOSIS — M25472 Effusion, left ankle: Secondary | ICD-10-CM | POA: Insufficient documentation

## 2014-08-06 DIAGNOSIS — Z23 Encounter for immunization: Secondary | ICD-10-CM | POA: Insufficient documentation

## 2014-08-06 NOTE — Assessment & Plan Note (Signed)
No significant debility reported, no h/o falls or limitation in mobility

## 2014-08-06 NOTE — Assessment & Plan Note (Signed)
Asymptomatic , rate controlled on current medication, has had annual cardiology eval in past 4 month, no change in mangement

## 2014-08-06 NOTE — Assessment & Plan Note (Addendum)
Intermittent left ankle swelling, mild, not related to heart or renal failure Judicious use of lasix and potassium, ten tabs to last 12 to 16 weeks

## 2014-08-06 NOTE — Assessment & Plan Note (Addendum)
Noted to be stable over the years by hematology, annual f/u planned

## 2014-08-06 NOTE — Assessment & Plan Note (Signed)
Reports using "a piece" of the med prescribed , and not certain that she is using every night I encouraged her to rely increasingly less on a sleep aid , and to practice good sleep hygiene Med dose reduced

## 2014-08-06 NOTE — Assessment & Plan Note (Signed)
Upcoming consultation with geriatric specialist hopefully with patient's 3 daughters present.longstanding concerns re property and where Ms Bittinger will live if she leaves her current home where she lives with one of her 3 daughters, Rosely Fernandez , who lives with her has voiced  increased concerns re her Mother's ability to live independently Essentially a lot of concern re forget fullness, most of which seems age appropriatei, however, expressed concern re late bill payment and Ms Charley's resistance to enabling [name] to control her finances  I have tried in the past to encourage Phiona to allow [name] to take over day to day finances , so that things remain in order, she has been somewhat reluctant/resistant, and input from a geriatric specialist , with her other daughters present , in the hope that current challenges can be addressed with all involved parties present. This has not happened to date

## 2014-08-06 NOTE — Assessment & Plan Note (Signed)
Hyperlipidemia:Low fat diet discussed and encouraged.  Updated lab needed  Pt and daughter have elected to avoid statin therapy due to concerns re current memory loss an potential worsening on medication

## 2014-08-06 NOTE — Assessment & Plan Note (Signed)
Vaccine administered at visit.  

## 2014-08-06 NOTE — Assessment & Plan Note (Signed)
Stable and pt exhibiting no anxiety at visit. Has not needed to see therapist for past several months

## 2014-08-18 DIAGNOSIS — M810 Age-related osteoporosis without current pathological fracture: Secondary | ICD-10-CM | POA: Diagnosis not present

## 2014-08-18 DIAGNOSIS — E785 Hyperlipidemia, unspecified: Secondary | ICD-10-CM | POA: Diagnosis not present

## 2014-08-18 LAB — BASIC METABOLIC PANEL
BUN: 12 mg/dL (ref 6–23)
CALCIUM: 9.2 mg/dL (ref 8.4–10.5)
CO2: 30 mEq/L (ref 19–32)
Chloride: 100 mEq/L (ref 96–112)
Creat: 0.82 mg/dL (ref 0.50–1.10)
GLUCOSE: 75 mg/dL (ref 70–99)
Potassium: 4.2 mEq/L (ref 3.5–5.3)
Sodium: 136 mEq/L (ref 135–145)

## 2014-08-18 LAB — LIPID PANEL
Cholesterol: 233 mg/dL — ABNORMAL HIGH (ref 0–200)
HDL: 84 mg/dL (ref >39)
LDL CALC: 139 mg/dL — AB (ref 0–99)
Total CHOL/HDL Ratio: 2.8 Ratio
Triglycerides: 51 mg/dL (ref <150)
VLDL: 10 mg/dL (ref 0–40)

## 2014-08-18 LAB — TSH: TSH: 2.817 u[IU]/mL (ref 0.350–4.500)

## 2014-08-19 LAB — VITAMIN D 25 HYDROXY (VIT D DEFICIENCY, FRACTURES): Vit D, 25-Hydroxy: 27 ng/mL — ABNORMAL LOW (ref 30–100)

## 2014-08-30 ENCOUNTER — Other Ambulatory Visit: Payer: Self-pay

## 2014-08-30 MED ORDER — VITAMIN D (ERGOCALCIFEROL) 1.25 MG (50000 UNIT) PO CAPS: 50000.0000 [IU] | ORAL_CAPSULE | ORAL | Status: DC

## 2014-08-31 ENCOUNTER — Encounter: Payer: Self-pay | Admitting: Internal Medicine

## 2014-09-01 ENCOUNTER — Other Ambulatory Visit: Payer: Self-pay | Admitting: Family Medicine

## 2014-11-02 ENCOUNTER — Encounter: Payer: Self-pay | Admitting: Internal Medicine

## 2014-11-10 ENCOUNTER — Telehealth: Payer: Self-pay | Admitting: Family Medicine

## 2014-11-10 NOTE — Telephone Encounter (Signed)
noted 

## 2014-11-10 NOTE — Telephone Encounter (Signed)
Noted  

## 2014-11-12 ENCOUNTER — Ambulatory Visit: Payer: Self-pay | Admitting: Internal Medicine

## 2014-11-16 ENCOUNTER — Encounter: Payer: Self-pay | Admitting: Internal Medicine

## 2014-11-22 ENCOUNTER — Telehealth: Payer: Self-pay | Admitting: Internal Medicine

## 2014-11-22 NOTE — Telephone Encounter (Signed)
ON RECALL FOR TCS °

## 2014-11-22 NOTE — Telephone Encounter (Signed)
Letter mailed to pt to call.  

## 2014-12-01 ENCOUNTER — Ambulatory Visit: Payer: Medicare Other | Admitting: Family Medicine

## 2014-12-28 ENCOUNTER — Ambulatory Visit: Payer: Self-pay | Admitting: Family Medicine

## 2014-12-31 ENCOUNTER — Other Ambulatory Visit: Payer: Self-pay | Admitting: Obstetrics & Gynecology

## 2014-12-31 DIAGNOSIS — R921 Mammographic calcification found on diagnostic imaging of breast: Secondary | ICD-10-CM

## 2015-01-04 ENCOUNTER — Ambulatory Visit (INDEPENDENT_AMBULATORY_CARE_PROVIDER_SITE_OTHER): Payer: Medicare Other | Admitting: Family Medicine

## 2015-01-04 ENCOUNTER — Encounter: Payer: Self-pay | Admitting: Family Medicine

## 2015-01-04 VITALS — BP 144/80 | HR 75 | Resp 16 | Ht 66.0 in | Wt 134.4 lb

## 2015-01-04 DIAGNOSIS — F411 Generalized anxiety disorder: Secondary | ICD-10-CM

## 2015-01-04 DIAGNOSIS — F0390 Unspecified dementia without behavioral disturbance: Secondary | ICD-10-CM | POA: Diagnosis not present

## 2015-01-04 DIAGNOSIS — K219 Gastro-esophageal reflux disease without esophagitis: Secondary | ICD-10-CM | POA: Diagnosis not present

## 2015-01-04 DIAGNOSIS — K5909 Other constipation: Secondary | ICD-10-CM

## 2015-01-04 DIAGNOSIS — M25472 Effusion, left ankle: Secondary | ICD-10-CM

## 2015-01-04 DIAGNOSIS — E785 Hyperlipidemia, unspecified: Secondary | ICD-10-CM

## 2015-01-04 DIAGNOSIS — J301 Allergic rhinitis due to pollen: Secondary | ICD-10-CM

## 2015-01-04 MED ORDER — DONEPEZIL HCL 5 MG PO TABS: 5.0000 mg | ORAL_TABLET | Freq: Every day | ORAL | Status: DC

## 2015-01-04 NOTE — Patient Instructions (Addendum)
F/u in 2 month, with rectal, call if you need me before  Please start aricept once daily to help with memory  Please make sure that you take metoprolol twice daily as prescribed, take 12 hrs apart, example 7 am and 7 pm, this will prevent palpitations and improve blood pressure  Your daughter needs to be responsible for ensuring you take medication as prescribed   Reduce tranxene to half daily for 2 weeks then discontinue on July 13 , stop tranxene   Take lasix and potassium as before.  I  Strongly recommend social work consult through [facility], call if you decide on this  Thanks for choosing [facility], we consider it a privelige to serve you.  CBc, fasting lipid, chem 7 , B12  Level in 8 weeks

## 2015-01-11 ENCOUNTER — Encounter: Payer: Self-pay | Admitting: Internal Medicine

## 2015-02-02 ENCOUNTER — Ambulatory Visit
Admission: RE | Admit: 2015-02-02 | Discharge: 2015-02-02 | Disposition: A | Discharge status: Home / Self Care | Payer: Medicare Other | Source: Ambulatory Visit | Attending: Obstetrics & Gynecology | Admitting: Obstetrics & Gynecology

## 2015-02-02 ENCOUNTER — Other Ambulatory Visit: Payer: Self-pay | Admitting: Obstetrics & Gynecology

## 2015-02-02 DIAGNOSIS — R921 Mammographic calcification found on diagnostic imaging of breast: Secondary | ICD-10-CM | POA: Diagnosis not present

## 2015-02-08 ENCOUNTER — Telehealth: Payer: Self-pay

## 2015-02-08 NOTE — Telephone Encounter (Signed)
[name] is requesting Dr to call her back sometime tomorrow. I asked what it was pertaining to and she said it was about her mothers memory. Did not want me sending a message with her concerns because she wanted to speak directly with Dr. She will be at the home number tomorrow

## 2015-02-11 ENCOUNTER — Other Ambulatory Visit: Payer: Self-pay | Admitting: Family Medicine

## 2015-02-11 NOTE — Telephone Encounter (Signed)
Spoke with daughter she has not had clorazepate, and I advised no need to continue this as the decision was for her to discontinue the med  Asked about her circulation, advised she had study done 06/2013 which was normal States Mom gets agitated at times, encouraged her to be patient,not respond or to walk away if she had to

## 2015-02-13 DIAGNOSIS — R61 Generalized hyperhidrosis: Secondary | ICD-10-CM | POA: Diagnosis not present

## 2015-02-16 ENCOUNTER — Telehealth: Payer: Self-pay | Admitting: Family Medicine

## 2015-02-16 NOTE — Telephone Encounter (Signed)
Pt needed bilateral diagnostril  mammogram with magnification of left breast end July, PLS see last mammo report when ordering, if need be write and fax I will sign, let [name] know and arrange study please, get any help that you need  ??? pls ask

## 2015-02-17 NOTE — Telephone Encounter (Signed)
This has been done by the [facility] Dr and follow up was recommended in one year

## 2015-02-24 ENCOUNTER — Other Ambulatory Visit: Payer: Self-pay | Admitting: Family Medicine

## 2015-03-01 ENCOUNTER — Telehealth: Payer: Self-pay

## 2015-03-01 NOTE — Telephone Encounter (Signed)
I called back and spoke directly with daughter who called and reviewed pt's ,medications with her , the doses, and also the way they were to be taken I also affirmed that I recommended direct supervision by Pt's caregiver re medication to ensure that Kathleen Gallegos took her meds. This was all printed during her last visit on the d/c instruction sheet, Kathleen Gallegos had her daughter think that she knew nothing of being on aricept All questions were answered

## 2015-03-01 NOTE — Telephone Encounter (Signed)
Call returned and documented

## 2015-03-01 NOTE — Telephone Encounter (Signed)
Pt's daughter Donella Stade called stating she wanted me to give Dr. Moshe Cipro an Alternate number to call as well. [phone]

## 2015-03-02 ENCOUNTER — Ambulatory Visit: Payer: Self-pay | Admitting: Family Medicine

## 2015-03-08 ENCOUNTER — Encounter: Payer: Self-pay | Admitting: Family Medicine

## 2015-03-08 ENCOUNTER — Ambulatory Visit (INDEPENDENT_AMBULATORY_CARE_PROVIDER_SITE_OTHER): Payer: Medicare Other | Admitting: Family Medicine

## 2015-03-08 VITALS — BP 146/82 | HR 76 | Resp 18 | Ht 66.0 in | Wt 133.0 lb

## 2015-03-08 DIAGNOSIS — F411 Generalized anxiety disorder: Secondary | ICD-10-CM | POA: Diagnosis not present

## 2015-03-08 DIAGNOSIS — Z23 Encounter for immunization: Secondary | ICD-10-CM | POA: Diagnosis not present

## 2015-03-08 DIAGNOSIS — E785 Hyperlipidemia, unspecified: Secondary | ICD-10-CM

## 2015-03-08 DIAGNOSIS — E559 Vitamin D deficiency, unspecified: Secondary | ICD-10-CM

## 2015-03-08 DIAGNOSIS — K5901 Slow transit constipation: Secondary | ICD-10-CM

## 2015-03-08 DIAGNOSIS — K219 Gastro-esophageal reflux disease without esophagitis: Secondary | ICD-10-CM

## 2015-03-08 DIAGNOSIS — J302 Other seasonal allergic rhinitis: Secondary | ICD-10-CM

## 2015-03-08 DIAGNOSIS — R002 Palpitations: Secondary | ICD-10-CM

## 2015-03-08 DIAGNOSIS — F03918 Unspecified dementia, unspecified severity, with other behavioral disturbance: Secondary | ICD-10-CM

## 2015-03-08 DIAGNOSIS — F0391 Unspecified dementia with behavioral disturbance: Secondary | ICD-10-CM | POA: Diagnosis not present

## 2015-03-08 DIAGNOSIS — M25472 Effusion, left ankle: Secondary | ICD-10-CM

## 2015-03-08 MED ORDER — DONEPEZIL HCL 10 MG PO TABS: 10.0000 mg | ORAL_TABLET | Freq: Every day | ORAL | Status: DC

## 2015-03-08 NOTE — Patient Instructions (Signed)
F/u in 10 weeks, call if you need me before  Flu vaccine today  MMSE  Today, and dose of aricept is increased to 10 mg daily

## 2015-03-12 DIAGNOSIS — K5900 Constipation, unspecified: Secondary | ICD-10-CM | POA: Insufficient documentation

## 2015-03-12 DIAGNOSIS — E559 Vitamin D deficiency, unspecified: Secondary | ICD-10-CM | POA: Insufficient documentation

## 2015-03-12 NOTE — Assessment & Plan Note (Signed)
Dietary  Management only, reports statin intolerance

## 2015-03-12 NOTE — Assessment & Plan Note (Signed)
After obtaining informed consent, the vaccine is  administered by LPN.  

## 2015-03-12 NOTE — Assessment & Plan Note (Signed)
Continue weekly supplement 

## 2015-03-12 NOTE — Assessment & Plan Note (Signed)
Managed with diet, stool softener and fiber supplement

## 2015-03-12 NOTE — Assessment & Plan Note (Signed)
Pt to use lasix and potassium sparingly only as needed, no selling on exam at this visit

## 2015-03-12 NOTE — Assessment & Plan Note (Signed)
Controlled, no change in medication  

## 2015-03-12 NOTE — Assessment & Plan Note (Addendum)
No current flare, stable and doing well on markedly reduced medication, uses flonase, no more immunotherapy

## 2015-03-12 NOTE — Assessment & Plan Note (Signed)
No significant improvement. No c/o intolerance to medication, which I hope that she is actually taking, will increase the dose to standard treating dose of 10 mg, daughter also strongly encouraged to look into adult day care placement, a new faciliity will be opened in the next approx 2 to 3 months, I believe that she is a great candidate for this

## 2015-03-12 NOTE — Assessment & Plan Note (Signed)
Improved and on no medication 

## 2015-03-12 NOTE — Progress Notes (Signed)
   Kathleen Gallegos     MRN: [id number]      DOB: 11-25-1925   HPI Kathleen Gallegos is here for follow up and re-evaluation of chronic medical conditions, medication management and review of any available recent lab and radiology data.  Preventive health is updated, specifically  Cancer screening and Immunization.    The PT denies any adverse reactions to current medications since the last visit.  There are no new concerns.  There are no specific complaints   ROS Denies recent fever or chills. Denies sinus pressure, nasal congestion, ear pain or sore throat. Denies chest congestion, productive cough or wheezing. Denies chest pains, palpitations and leg swelling Denies abdominal pain, nausea, vomiting,diarrhea or constipation.   Denies dysuria, frequency, hesitancy or incontinence. Denies disabling  joint pain, swelling and limitation in mobility. Denies headaches, seizures, numbness, or tingling. Denies depression, uncontrolled  anxiety or insomnia. Denies skin break down or rash.Has dry skin   PE  BP 146/82 mmHg  Pulse 76  Resp 18  Ht 5\' 6"  (1.676 m)  Wt 133 lb (60.328 kg)  BMI 21.48 kg/m2  SpO2 98%  Patient alert  and in no cardiopulmonary distress. MMSE at visit, 8 weeks after aricept 5 mg is unchanged  HEENT: No facial asymmetry, EOMI,   oropharynx pink and moist.  Neck supple no JVD, no mass.  Chest: Clear to auscultation bilaterally.  CVS: S1, S2 no murmurs, no S3.Regular rate.  ABD: Soft non tender.   Ext: No edema  MS: Adequate though reduced ROM spine, shoulders, hips and knees.  Skin: Intact, no ulcerations or rash noted.  Psych: Good eye contact, normal affect. Memory impaired not anxious or depressed appearing.  CNS: CN 2-12 intact, power,  normal throughout.no focal deficits noted.   Assessment & Plan   Dementia No significant improvement. No c/o intolerance to medication, which I hope that she is actually taking, will increase the dose to  standard treating dose of 10 mg, daughter also strongly encouraged to look into adult day care placement, a new faciliity will be opened in the next approx 2 to 3 months, I believe that she is a great candidate for this  GENERALIZED ANXIETY DISORDER Improved and on no medication  Need for prophylactic vaccination and inoculation against influenza After obtaining informed consent, the vaccine is  administered by LPN.   Seasonal allergies No current flare, stable and doing well on markedly reduced medication, uses flonase, no more immunotherapy  GERD Controlled, no change in medication   Left ankle swelling Pt to use lasix and potassium sparingly only as needed, no selling on exam at this visit  PALPITATIONS Controlled, no change in medication   Vitamin D deficiency Continue weekly supplement  Hyperlipemia Dietary  Management only, reports statin intolerance  Constipation Managed with diet, stool softener and fiber supplement

## 2015-03-15 NOTE — Progress Notes (Signed)
   Kathleen Gallegos     MRN: [id number]      DOB: 1926/03/23   HPI Kathleen Gallegos is here for follow up and re-evaluation of chronic medical conditions, medication management and review of any available recent lab and radiology data.  Preventive health is updated, specifically  Immunization.   Questions or concerns regarding consultations or procedures which the PT has had in the interim are  addressed. The PT denies any adverse reactions to current medications since the last visit. States she feels well and only notes intermittent ankle swelling. Daughter who takes of her is concerned that her mom often forgets to take her medication , and that she is displaying increased symptoms of forgetfuness and irritability, wants to try medication once more to help with this   ROS Denies recent fever or chills. Denies sinus pressure, nasal congestion, ear pain or sore throat. Denies chest congestion, productive cough or wheezing. Denies chest pains, palpitations and does have intermittent leg swelling which responds to lasix intermittently Denies abdominal pain, nausea, vomiting,diarrhea or constipation.   Denies dysuria, frequency, hesitancy or incontinence. Intermittent joint pain, swelling and limitation in mobility.Especially the ankle Denies headaches, seizures, numbness, or tingling. Denies depression, notes less anxiety and insomnia. Denies skin break down or rash.Has dry skin which she uses moisturizer daily   PE  BP 144/80 mmHg  Pulse 75  Resp 16  Ht 5\' 6"  (1.676 m)  Wt 134 lb 6.4 oz (60.963 kg)  BMI 21.70 kg/m2  SpO2 97%  Patient alert and oriented and in no cardiopulmonary distress.  HEENT: No facial asymmetry, EOMI,   oropharynx pink and moist.  Neck supple no JVD, no mass.  Chest: Clear to auscultation bilaterally.  CVS: S1, S2 no murmurs, no S3.Regular rate.  ABD: Soft non tender.   Ext: No edema  MS: Adequate ROM spine, shoulders, hips and knees.  Skin: Intact, no  ulcerations or rash noted.  Psych: Good eye contact, normal affect. Memory loss not anxious or depressed appearing.  CNS: CN 2-12 intact, power,  normal throughout.no focal deficits noted.   Assessment & Plan   Dementia Daughter who lives with her notes increased symptoms of forgetfulness and irritability, wishes a repeat trial of aricept, will prescribe 6 week course of 5 mg dose , and have her return for re evaluation  GENERALIZED ANXIETY DISORDER Improved, reduce tranxxene with a view to discontinuing entirely, this is agreed upon at visit  Hyperlipemia Hyperlipidemia:Low fat diet discussed and encouraged.   Lipid Panel  Lab Results  Component Value Date   CHOL 233* 08/18/2014   HDL 84 08/18/2014   LDLCALC 139* 08/18/2014   TRIG 51 08/18/2014   CHOLHDL 2.8 08/18/2014   Dietary management only as family concerned about possible adverse effect on memory of statin therapy, they have tried this in the past    GERD Controlled, no change in medication   Constipation Controlled, no change in medication   Allergic rhinitis due to pollen Controlled, no change in medication   Left ankle swelling Intermittent limited use of lasix and potassium as before

## 2015-03-17 ENCOUNTER — Other Ambulatory Visit: Payer: Self-pay | Admitting: Family Medicine

## 2015-03-18 ENCOUNTER — Ambulatory Visit: Payer: Self-pay | Admitting: Internal Medicine

## 2015-03-20 NOTE — Assessment & Plan Note (Signed)
Controlled, no change in medication  

## 2015-03-20 NOTE — Assessment & Plan Note (Signed)
Improved, reduce tranxxene with a view to discontinuing entirely, this is agreed upon at visit

## 2015-03-20 NOTE — Assessment & Plan Note (Signed)
Daughter who lives with her notes increased symptoms of forgetfulness and irritability, wishes a repeat trial of aricept, will prescribe 6 week course of 5 mg dose , and have her return for re evaluation

## 2015-03-20 NOTE — Assessment & Plan Note (Signed)
Hyperlipidemia:Low fat diet discussed and encouraged.   Lipid Panel  Lab Results  Component Value Date   CHOL 233* 08/18/2014   HDL 84 08/18/2014   LDLCALC 139* 08/18/2014   TRIG 51 08/18/2014   CHOLHDL 2.8 08/18/2014   Dietary management only as family concerned about possible adverse effect on memory of statin therapy, they have tried this in the past

## 2015-03-20 NOTE — Assessment & Plan Note (Signed)
Intermittent limited use of lasix and potassium as before

## 2015-04-06 ENCOUNTER — Telehealth: Payer: Self-pay | Admitting: *Deleted

## 2015-04-06 NOTE — Telephone Encounter (Signed)
Crystal Hillary, Rylinn Galeana's daughter called lmom stating Ms Andreason is visiting with her in [place] stated they have questions about her medications. Please call her at [phone] or [phone]

## 2015-04-07 NOTE — Telephone Encounter (Signed)
Called and spoke with both daughter and patient and clarified questions on aricept and metoprolol.

## 2015-04-25 ENCOUNTER — Telehealth: Payer: Self-pay

## 2015-04-25 NOTE — Telephone Encounter (Signed)
Can be done further at OV, pls explain , pls get any pertinent details she wants addressed/ changed and document, so I can see if anything cabve started before visit, also explain, this is possibly where the [facility] social worker consult that I have been suggesting will be extremely benficial, as it is living condition she is asking about, pls refer if she agrees

## 2015-04-26 NOTE — Telephone Encounter (Signed)
Spoke with daughter and she would like to hold on [facility] referral.  But will get with sisters to have them to come to appointment.

## 2015-05-04 ENCOUNTER — Other Ambulatory Visit: Payer: Self-pay | Admitting: Family Medicine

## 2015-05-12 ENCOUNTER — Ambulatory Visit (INDEPENDENT_AMBULATORY_CARE_PROVIDER_SITE_OTHER): Payer: Medicare Other | Admitting: Otolaryngology

## 2015-05-12 DIAGNOSIS — D440 Neoplasm of uncertain behavior of thyroid gland: Secondary | ICD-10-CM

## 2015-05-18 ENCOUNTER — Ambulatory Visit: Payer: Self-pay | Admitting: Internal Medicine

## 2015-05-24 ENCOUNTER — Ambulatory Visit (INDEPENDENT_AMBULATORY_CARE_PROVIDER_SITE_OTHER): Payer: Medicare Other | Admitting: Family Medicine

## 2015-05-24 DIAGNOSIS — Z719 Counseling, unspecified: Secondary | ICD-10-CM

## 2015-05-24 MED ORDER — FLUTICASONE PROPIONATE 50 MCG/ACT NA SUSP: 1.0000 | Freq: Every day | NASAL | Status: DC

## 2015-05-24 NOTE — Progress Notes (Signed)
Subjective:    Patient ID: Kathleen Gallegos, female    DOB: Jun 25, 1926, 81 y.o.   MRN: [id number]  HPI Two of Kathleen Gallegos three  daughters in for consultation, orchestrated by Kathleen Gallegos, and without Kathleen Gallegos being present,  Kathleen Gallegos, and Kathleen Gallegos, to discuss Patient's current level of function, specific  Concerns voiced by Kathleen Gallegos, who she does not live with is whether her mother is able to stay  By herself, does she have memory los and how advanced it is, and is she  Capable of making her own  decisions. Essentially , she and her sister Kathleen Gallegos who lives with her mother used the meeting as a means of venting long held negative feelings about each other, mainly voiced by Kathleen Gallegos, the feeling that communication barriers were a major problem as they try  To care for their mother, and Kathleen Gallegos  Basically feels and appears to be at " the end of the road" as far as caring for her Mom, needs a physical and mental break, has always voiced her impression that her Mother felt less of her than the 2 siblings who do not care for her on a daily basis. Kathleen Gallegos, stated clearly that she herself was overwhelmed with her own health and personal issues, making it impossible to have been more directly involved with her Mother's care , and stated that she fully understood Kathleen Gallegos's feelings,  Committed to getting her Mother in the next 2 days , plan was that she would be gone for 1 week, whether with Kathleen Gallegos only , or also with her other sibling as well , who lives in [place] but was not present at the meeting. Kathleen Gallegos seemed to still be angry with Kathleen Gallegos about not being involved and helping, poor communication , and accused her to the end of the visit of having a "sarcastic attitude " toward her , which I told her that I did not sense myself. Both sisters stated they were open to their Mom living in assisted living facility or family home, and Kathleen Gallegos also stated that without involving her  , her sister in [place] had already began  the process. Kathleen Gallegos also stated that she has begun the process of moving out of her Mothers home, as she states the house is being sold, so she is actively looking for new accomodation, and has the plan to live independently, still caring for her Mom , as needed   Review of Systems     Objective:   Physical Exam        Assessment & Plan:  Encounter for consultation Two of Kathleen Gallegos three daughters, Kathleen Gallegos and Kathleen Gallegos, present at visit, Kathleen Gallegos not present. Meeting arranged by Kathleen Gallegos specifically so that her sisters could hear my professional opinion regarding patient's health a far as her diagnosis of dementia and her ability to live  independently , manage her medication ,have sound decision making capability. The degree of physical and mental burnout as well as frustration, feeling that there is little to no communication between her and her siblings as well as no help for her Mother's care is also discussed . Kathleen Gallegos agreed to have her Mother stay with her later in the week, and hopefully Kathleen Gallegos will have the 1 week break that she needs Long term facility/ assisted living is also discussed, and both state that this is something they would be agreeable to Thirty minutes of face to face time spent with Kathleen Gallegos and Kathleen Gallegos during this visit Medications are reviewed and clarified and  updated and current list provided at end of visit, this has been a concern for the 2 siblings who do not generally care for her , and this is understood

## 2015-05-24 NOTE — Patient Instructions (Addendum)
[name] needs follow up visit in mid January, call if you need me sooner   Arrange one week stay with one or both  of the other 2 siblings this week, so that [name] can get the mental break that she needs  Consider long term living facility for [name]   Medications which she is prescribed daily are metoprolol, aricept, aspirin, calcium, and vit D weekly, flonase, omega 3, culturelle, miralax, colace.

## 2015-05-25 ENCOUNTER — Encounter: Payer: Self-pay | Admitting: Cardiovascular Disease

## 2015-05-25 ENCOUNTER — Ambulatory Visit (INDEPENDENT_AMBULATORY_CARE_PROVIDER_SITE_OTHER): Payer: Medicare Other | Admitting: Cardiovascular Disease

## 2015-05-25 VITALS — BP 102/60 | HR 68 | Ht 63.0 in | Wt 132.0 lb

## 2015-05-25 DIAGNOSIS — E785 Hyperlipidemia, unspecified: Secondary | ICD-10-CM

## 2015-05-25 DIAGNOSIS — R002 Palpitations: Secondary | ICD-10-CM

## 2015-05-25 NOTE — Assessment & Plan Note (Signed)
History of occasional palpitations on low-dose beta blocker

## 2015-05-25 NOTE — Progress Notes (Signed)
05/25/2015 Harshita Boothman Matzek   09-Apr-1926  [id number]  Primary Physician Tula Nakayama, MD Primary Cardiologist: Lorretta Harp MD Renae Gloss   HPI:  The patient is a delightful 81 year old, thin appearing, widowed, African American female mother of 3 children who is accompanied by her oldest daughter today who is her medical power of attorney. I last saw her 12 months ago. She has a history of treated dyslipidemia, irritable bowel syndrome, mild dementia and anxiety. She denies chest pain or shortness of breath. She has had a normal 2D echo and Myoview in the past. Her lipid profile performed 07/21/13 revealed an increase in her LDL from 160 147 as a result of discontinuing her statin drug by her PCP because of concerns about memory loss. Apparently she was taken off her statin by her family because of concerns regarding short-term memory loss    Current Outpatient Prescriptions  Medication Sig Dispense Refill  . aspirin 81 MG tablet Take 81 mg by mouth as needed.     . Calcium Citrate-Vitamin D (CITRACAL + D PO) Take 1 tablet by mouth. 250iu of Vitamin D and 200mg  of Calcium daily    . Carboxymeth-Glycerin-Polysorb (REFRESH OPTIVE ADVANCED OP) Apply 1-2 drops to eye daily as needed.     . docusate sodium (COLACE) 100 MG capsule Take 100 mg by mouth daily.     Marland Kitchen donepezil (ARICEPT) 10 MG tablet Take 1 tablet (10 mg total) by mouth at bedtime. 30 tablet 4  . EPINEPHrine (EPI-PEN) 0.3 mg/0.3 mL SOAJ injection Inject 0.3 mLs (0.3 mg total) into the muscle once. 1 Device 2  . fluticasone (FLONASE) 50 MCG/ACT nasal spray Place 1-2 sprays into both nostrils daily. Hold on file 16 g 5  . Lactobacillus Rhamnosus, GG, (CULTURELLE PO) Take 1 capsule by mouth daily.     . metoprolol succinate (TOPROL-XL) 25 MG 24 hr tablet TAKE ONE-HALF TABLET BY MOUTH TWICE DAILY 30 tablet 2  . Omega-3 Fatty Acids (OMEGA 3 PO) Take 1 capsule by mouth daily.     . polyethylene glycol powder  (MIRALAX) powder Take 8.5-17 g by mouth 2 (two) times daily. Take half dose (8.5g)  in the morning and 17 g at night    . Vitamin D, Ergocalciferol, (DRISDOL) 50000 UNITS CAPS capsule TAKE ONE CAPSULE BY MOUTH ONCE A WEEK 4 capsule 4   No current facility-administered medications for this visit.    Allergies  Allergen Reactions  . Diphenhydramine Hcl Other (See Comments)    unknown  . Rivastigmine Tartrate Palpitations    Social History   Social History  . Marital Status: Widowed    Spouse Name: N/A  . Number of Children: 3  . Years of Education: N/A   Occupational History  . retired    Social History Main Topics  . Smoking status: Never Smoker   . Smokeless tobacco: Never Used  . Alcohol Use: No  . Drug Use: No  . Sexual Activity: No   Other Topics Concern  . Not on file   Social History Narrative     Review of Systems: General: negative for chills, fever, night sweats or weight changes.  Cardiovascular: negative for chest pain, dyspnea on exertion, edema, orthopnea, palpitations, paroxysmal nocturnal dyspnea or shortness of breath Dermatological: negative for rash Respiratory: negative for cough or wheezing Urologic: negative for hematuria Abdominal: negative for nausea, vomiting, diarrhea, bright red blood per rectum, melena, or hematemesis Neurologic: negative for visual changes, syncope, or dizziness All  other systems reviewed and are otherwise negative except as noted above.    Blood pressure 102/60, pulse 68, height 5\' 3"  (1.6 m), weight 132 lb (59.875 kg).  General appearance: alert and no distress Neck: no adenopathy, no carotid bruit, no JVD, supple, symmetrical, trachea midline and thyroid not enlarged, symmetric, no tenderness/mass/nodules Lungs: clear to auscultation bilaterally Heart: regular rate and rhythm, S1, S2 normal, no murmur, click, rub or gallop Extremities: extremities normal, atraumatic, no cyanosis or edema  EKG normal sinus rhythm 68  without ST or T-wave changes. There was an isolated PVC noted. I personally reviewed this EKG  ASSESSMENT AND PLAN:   PALPITATIONS History of occasional palpitations on low-dose beta blocker  Hyperlipemia History of hyperlipidemia not on statin drug because of question of statin-induced short-term memory disorder with recent lipid profile performed 08/18/14 revealing a total cholesterol 233, LDL 139 and HDL of 84.      Lorretta Harp MD FACP,FACC,FAHA, [facility] 05/25/2015 2:19 PM

## 2015-05-25 NOTE — Patient Instructions (Signed)
Medication Instructions:  Your physician recommends that you continue on your current medications as directed. Please refer to the Current Medication list given to you today.   Labwork: none  Testing/Procedures: none  Follow-Up: Follow up with Dr. Berry as needed.   Any Other Special Instructions Will Be Listed Below (If Applicable).     If you need a refill on your cardiac medications before your next appointment, please call your pharmacy.   

## 2015-05-25 NOTE — Assessment & Plan Note (Signed)
History of hyperlipidemia not on statin drug because of question of statin-induced short-term memory disorder with recent lipid profile performed 08/18/14 revealing a total cholesterol 233, LDL 139 and HDL of 84.

## 2015-05-27 ENCOUNTER — Encounter: Payer: Self-pay | Admitting: Family Medicine

## 2015-05-27 DIAGNOSIS — Z719 Counseling, unspecified: Secondary | ICD-10-CM | POA: Insufficient documentation

## 2015-05-27 DIAGNOSIS — IMO0001 Reserved for inherently not codable concepts without codable children: Secondary | ICD-10-CM | POA: Insufficient documentation

## 2015-05-27 NOTE — Assessment & Plan Note (Addendum)
Two of Ms Gores three daughters, [name] and [name], present at visit, Catera not present. Meeting arranged by [name] specifically so that her sisters could hear my professional opinion regarding patient's health a far as her diagnosis of dementia and her ability to live  independently , manage her medication ,have sound decision making capability. The degree of physical and mental burnout as well as frustration, feeling that there is little to no communication between her and her siblings as well as no help for her Mother's care is also discussed . [name] agreed to have her Mother stay with her later in the week, and hopefully [name] will have the 1 week break that she needs Long term facility/ assisted living is also discussed, and both state that this is something they would be agreeable to Thirty minutes of face to face time spent with [name] and [name] during this visit Medications are reviewed and clarified and updated and current list provided at end of visit, this has been a concern for the 2 siblings who do not generally care for her , and this is understood

## 2015-06-07 DIAGNOSIS — Z961 Presence of intraocular lens: Secondary | ICD-10-CM | POA: Diagnosis not present

## 2015-06-16 ENCOUNTER — Encounter: Payer: Self-pay | Admitting: Internal Medicine

## 2015-06-16 ENCOUNTER — Ambulatory Visit (INDEPENDENT_AMBULATORY_CARE_PROVIDER_SITE_OTHER): Payer: Medicare Other | Admitting: Internal Medicine

## 2015-06-16 VITALS — BP 118/68 | HR 69 | Ht 66.0 in | Wt 136.2 lb

## 2015-06-16 DIAGNOSIS — J301 Allergic rhinitis due to pollen: Secondary | ICD-10-CM

## 2015-06-16 DIAGNOSIS — J45998 Other asthma: Secondary | ICD-10-CM

## 2015-06-16 NOTE — Patient Instructions (Signed)
Ok to use otc flonase nasal spray and an antihistamine like allegra or claritin, if needed  Please call if we can help

## 2015-06-16 NOTE — Progress Notes (Signed)
Patient ID: Kathleen Gallegos, female    DOB: [birth year], 81 y.o.   MRN: [id number]  HPI 03/27/2011-81 year old female never smoker followed for asthma, allergic rhinitis Last here 07/24/2010-note reviewed She notices persistent left nostril stuffiness in the mornings with watery rhinorrhea but no blood or headache. This has not gotten worse in the fall weather. She continues allergy shots, believes they help her. Denies wheeze cough or shortness of breath and has not been using her rescue inhaler.  06/08/12--81 year old female never smoker followed for asthma, allergic rhinitis No significant respiratory infections this winter. Her primary physician gave Cipro for urinary infection ending yesterday. This also improved her sinus drainage which is noted mostly when she is lying down. She denies cough and says her chest feels clear.  11/07/11- 81 year old female never smoker followed for asthma, allergic rhinitis   PCP Dr Moshe Cipro Family member here Scratchy throat with increased pollen; nasal drainage every morning-white in color. Nasal congestion. CT chest 10/09/2011 was reviewed with them in detail:. Dr. Moshe Cipro had been good enough to send a note directing attention to her left lower lobe nodule. A thyroid nodule was also seen and biopsied "benign". IMPRESSION:  1. Left lower lobe nodule measures slightly larger than the  examination from 2008. Hounsfield unit measurements from this  nodule are similar to the thoracic aorta and nearby pulmonary  artery and veins. The differential diagnosis for this nodule  includes pulmonary AVM, slow-growing pulmonary neoplasm, or  impacted distal bronchial containing inspissated mucous.  Management strategies include follow-up imaging  to document stability. A contrast-enhanced CT of the chest and 3  months would be recommended in this case. Alternatively,  endobronchial ultrasound may be helpful for further evaluation.  This nodule may be too small to  biopsy percutaneously and is below  the size threshold for PET CT.  2. The other small nodules are stable when compared with previous  imaging.  3. Increase in size of right lobe of thyroid gland nodule.  Suggest further evaluation with thyroid sonography.  Original Report Authenticated By: Angelita Ingles, M.D.   02/15/12- 81 year old female never smoker followed for asthma, allergic rhinitis, lung nodule   PCP Dr Moshe Cipro Scheduled 6 MWT was cancelled due to late arrival. Here with daughter   says breathing has been good and she can walk 2 miles a day. Blames dust and mold for hoarseness all summer. CT chest - 02/14/12 reviewed with them. IMPRESSION:  9 x 7 mm left lower lobe pulmonary opacity, favored to reflect  inspissated mucus within a bronchocele. Given only minimally  increase in size when compared to 2009, this appearance is almost  certainly benign.  If additional follow-up imaging is desired, unenhanced CT is  recommended to confirm the opacity is intrinsically hyperdense  rather than enhancing.  Original Report Authenticated By: Julian Hy, M.D.   05/29/13- 81 year old female never smoker followed for asthma, allergic rhinitis, lung nodule   PCP Dr Moshe Cipro FOLLOWS FOR:  Breathing doing well.  Having PND and some mucus, having to clear throat  doing pretty well but admits some postnasal drip. Denies routine cough or chest tightness. Did cough up a slight streak of blood at least once. Denies night sweats, fever, adenopathy. She had been getting allergy vaccine but was irregular with her shots. We discussed likelihood at her age that they were no longer helpful.  06/15/14- 81 year old female never smoker followed for asthma, allergic rhinitis, lung nodule   PCP Dr Moshe Cipro FOLLOWS FOR: pt c/o scratchy  throat since leaving house today.  Has been off allergy injections since last October.   CXR 05/29/13 IMPRESSION: No evidence of acute cardiopulmonary disease. Electronically  Signed  By: Julian Hy M.D.  On: 05/29/2013 12:45  06/16/2015-81 year old female never smoker followed for asthma, allergic rhinitis, lung nodule FOLLOWS FOR: pt. states she has a scratchy throat x1d. Has been off of allergy injections since 2015. No significant acute symptoms. Has felt well controlled. Uses occasional antihistamine and Flonase  Review of Systems-see HPI   Constitutional:   No-   weight loss, night sweats, fevers, chills, fatigue, lassitude. HEENT:   No-  headaches, difficulty swallowing, tooth/dental problems, sore throat,       No-  sneezing, itching, ear ache,  +nasal congestion, post nasal drip, hoarseness CV:  No-   chest pain, orthopnea, PND, swelling in lower extremities, anasarca, dizziness, palpitations Resp: + shortness of breath with exertion or at rest.              No-   productive cough,  No non-productive cough,  Once- coughing up of blood.              No-   change in color of mucus.  No- wheezing.   Skin: No-   rash or lesions. GI:  No-   heartburn, indigestion, abdominal pain, nausea, vomiting,  GU: MS:  No-   joint pain or swelling.   Neuro-  Psych:  No- change in mood or affect. No depression or anxiety.  No memory loss.  OBJ- General- Alert, Oriented, Affect-appropriate, Distress- none acute, comfortable-appearing elderly woman, talkative Skin- rash-none, lesions- none, excoriation- none Lymphadenopathy- none Head- atraumatic            Eyes- Gross vision intact, PERRLA, conjunctivae clear secretions            Ears- Hearing, canals normal for age            Nose- Clear, no-Septal dev, mucus, polyps, erosion, perforation             Throat- Mallampati II , mucosa clear , drainage- none, tonsils- atrophic, missing teeth Neck- flexible , trachea midline, no stridor , thyroid nl, carotid no bruit Chest - symmetrical excursion , unlabored           Heart/CV- RRR , no murmur , no gallop  , no rub, nl s1 s2                           - JVD-  none , edema- none, stasis changes- none, varices- none           Lung-  +Distant but clear to P&A, wheeze- none, cough- none , dullness-none, rub- none           Chest wall-  Abd-  Br/ Gen/ Rectal- Not done, not indicated Extrem- cyanosis- none, clubbing, none, atrophy- none, strength- nl Neuro- grossly intact to observation

## 2015-06-17 NOTE — Assessment & Plan Note (Signed)
At her age nonallergic/vasomotor rhinitis is more likely than allergy. Symptomatic control with an OTC nonsedating antihistamine like Claritin should be sufficient

## 2015-06-17 NOTE — Assessment & Plan Note (Signed)
Adequate control without significant acute events or use of bronchodilators recently. We agreed that she will would do very well under the care of her primary physician. She can be seen here again as needed.

## 2015-07-12 ENCOUNTER — Other Ambulatory Visit (HOSPITAL_COMMUNITY): Payer: Self-pay

## 2015-07-26 ENCOUNTER — Other Ambulatory Visit (HOSPITAL_COMMUNITY): Payer: Self-pay | Admitting: Oncology

## 2015-07-26 DIAGNOSIS — D649 Anemia, unspecified: Secondary | ICD-10-CM

## 2015-07-26 DIAGNOSIS — D7589 Other specified diseases of blood and blood-forming organs: Secondary | ICD-10-CM

## 2015-07-26 DIAGNOSIS — D61818 Other pancytopenia: Secondary | ICD-10-CM

## 2015-07-27 ENCOUNTER — Encounter (HOSPITAL_COMMUNITY): Payer: Medicare Other | Attending: Hematology & Oncology

## 2015-07-27 ENCOUNTER — Encounter (HOSPITAL_BASED_OUTPATIENT_CLINIC_OR_DEPARTMENT_OTHER): Payer: Medicare Other | Admitting: Oncology

## 2015-07-27 ENCOUNTER — Ambulatory Visit (HOSPITAL_COMMUNITY): Payer: Self-pay | Admitting: Oncology

## 2015-07-27 VITALS — BP 123/58 | HR 77 | Temp 97.8°F | Resp 16 | Wt 134.2 lb

## 2015-07-27 DIAGNOSIS — D61818 Other pancytopenia: Secondary | ICD-10-CM | POA: Insufficient documentation

## 2015-07-27 DIAGNOSIS — D649 Anemia, unspecified: Secondary | ICD-10-CM | POA: Insufficient documentation

## 2015-07-27 DIAGNOSIS — D7589 Other specified diseases of blood and blood-forming organs: Secondary | ICD-10-CM | POA: Diagnosis not present

## 2015-07-27 DIAGNOSIS — M8580 Other specified disorders of bone density and structure, unspecified site: Secondary | ICD-10-CM | POA: Diagnosis not present

## 2015-07-27 LAB — COMPREHENSIVE METABOLIC PANEL
ALT: 24 U/L (ref 14–54)
ANION GAP: 6 (ref 5–15)
AST: 32 U/L (ref 15–41)
Albumin: 4.1 g/dL (ref 3.5–5.0)
Alkaline Phosphatase: 36 U/L — ABNORMAL LOW (ref 38–126)
BUN: 15 mg/dL (ref 6–20)
CO2: 31 mmol/L (ref 22–32)
CREATININE: 0.9 mg/dL (ref 0.44–1.00)
Calcium: 9.5 mg/dL (ref 8.9–10.3)
Chloride: 103 mmol/L (ref 101–111)
GFR, EST NON AFRICAN AMERICAN: 56 mL/min — AB (ref >60)
Glucose, Bld: 81 mg/dL (ref 65–99)
Potassium: 3.6 mmol/L (ref 3.5–5.1)
SODIUM: 140 mmol/L (ref 135–145)
Total Bilirubin: 0.7 mg/dL (ref 0.3–1.2)
Total Protein: 8.1 g/dL (ref 6.5–8.1)

## 2015-07-27 LAB — IRON AND TIBC
Iron: 77 ug/dL (ref 28–170)
Saturation Ratios: 22 % (ref 10.4–31.8)
TIBC: 350 ug/dL (ref 250–450)
UIBC: 273 ug/dL

## 2015-07-27 LAB — CBC WITH DIFFERENTIAL/PLATELET
Basophils Absolute: 0 10*3/uL (ref 0.0–0.1)
Basophils Relative: 0 %
EOS ABS: 0.1 10*3/uL (ref 0.0–0.7)
EOS PCT: 2 %
HCT: 38 % (ref 36.0–46.0)
Hemoglobin: 12.6 g/dL (ref 12.0–15.0)
LYMPHS ABS: 1.4 10*3/uL (ref 0.7–4.0)
LYMPHS PCT: 33 %
MCH: 33.6 pg (ref 26.0–34.0)
MCHC: 33.2 g/dL (ref 30.0–36.0)
MCV: 101.3 fL — AB (ref 78.0–100.0)
MONOS PCT: 10 %
Monocytes Absolute: 0.4 10*3/uL (ref 0.1–1.0)
Neutro Abs: 2.4 10*3/uL (ref 1.7–7.7)
Neutrophils Relative %: 55 %
Platelets: 148 10*3/uL — ABNORMAL LOW (ref 150–400)
RBC: 3.75 MIL/uL — AB (ref 3.87–5.11)
RDW: 12.7 % (ref 11.5–15.5)
WBC: 4.4 10*3/uL (ref 4.0–10.5)

## 2015-07-27 LAB — C-REACTIVE PROTEIN: CRP: <0.5 mg/dL (ref <1.0)

## 2015-07-27 LAB — LACTATE DEHYDROGENASE: LDH: 206 U/L — AB (ref 98–192)

## 2015-07-27 LAB — RETICULOCYTES
RBC.: 3.75 MIL/uL — ABNORMAL LOW (ref 3.87–5.11)
RETIC CT PCT: 1.1 % (ref 0.4–3.1)
Retic Count, Absolute: 41.3 10*3/uL (ref 19.0–186.0)

## 2015-07-27 LAB — VITAMIN B12: VITAMIN B 12: 752 pg/mL (ref 180–914)

## 2015-07-27 LAB — FOLATE: Folate: 33.2 ng/mL (ref >5.9)

## 2015-07-27 LAB — FERRITIN: Ferritin: 103 ng/mL (ref 11–307)

## 2015-07-27 NOTE — Patient Instructions (Signed)
..  [place] at [facility] Discharge Instructions  RECOMMENDATIONS MADE BY THE CONSULTANT AND ANY TEST RESULTS WILL BE SENT TO YOUR REFERRING PHYSICIAN.  Labs have been done today Return in 12 months for f/u  Thank you for choosing [place] at [facility] to provide your oncology and hematology care.  To afford each patient quality time with our provider, please arrive at least 15 minutes before your scheduled appointment time.   Beginning January 23rd 2017 lab work for the [facility] will be done in the  Main lab at [facility] on 1st floor. If you have a lab appointment with the [facility] please come in thru the  Main Entrance and check in at the main information desk  You need to re-schedule your appointment should you arrive 10 or more minutes late.  We strive to give you quality time with our providers, and arriving late affects you and other patients whose appointments are after yours.  Also, if you no show three or more times for appointments you may be dismissed from the clinic at the providers discretion.     Again, thank you for choosing [facility].  Our hope is that these requests will decrease the amount of time that you wait before being seen by our physicians.       _____________________________________________________________  Should you have questions after your visit to [facility], please contact our office at [phone] between the hours of 8:30 a.m. and 4:30 p.m.  Voicemails left after 4:30 p.m. will not be returned until the following business day.  For prescription refill requests, have your pharmacy contact our office.

## 2015-07-27 NOTE — Assessment & Plan Note (Addendum)
Intermittent pancytopenia since at least 2008 thus far without change.    Stable.    No need for bone marrow aspiration and biopsy to evaluate for MDS given patient's age and co-morbidities.    No need for ESA therapy at this time based upon lab values.    Labs today: CBC diff, CMET, LDH, ESR, CRP, anemia panel, retic count, EPO, MM panel, and B2M  Of note, she does have osteopenia on bone density exam in 2014.  Renal function is WNL, Calcium is WNL, and anemia is > 10 g/dL.  She was recently released from Dr. Annamaria Boots, pulmonology, given her current and stable asthma and allergies.  These are now being managed by Dr. Moshe Cipro, primary care provider, who actually sees the patient tomorrow in her clinic.  I have reviewed Dr. Griffin Dakin notes and note the conversation amongst the patient's family members regarding family care of the patient.  Her cardiologist is Dr. Gwenlyn Found.  His note is reviewed and the patient is stable from a cardiac standpoint.  If labs are unimpressive today, then we will check labs next year.  Labs in 12 months: CBC diff, retic count  Return in 12 months for follow-up.  If labs are unimpressive today and in 12 months, we will release the patient from the clinic.

## 2015-07-27 NOTE — Progress Notes (Signed)
Kathleen Nakayama, MD [address] [place] [address]  Pancytopenia [facility]) - Plan: CBC with Differential, Reticulocytes  CURRENT THERAPY: Surveillance  INTERVAL HISTORY: Kathleen Gallegos 82 y.o. female returns for followup of intermittent pancytopenia dating back to at least 2008 without any changes.   I personally reviewed and went over laboratory results with the patient.  The results are noted within this dictation.  We will update labs today.  I personally reviewed and went over radiographic studies with the patient.  The results are noted within this dictation.  Mammogram in June 2016 was BIRADS 3.  Chart is reviewed. No hospitalizations noted over the past 12 months.  I have reviewed the patient's most recent notes from Dr. Moshe Cipro, Dr. Gwenlyn Found, and Dr. Annamaria Boots.  She denies any recurrent infections.  She denies any b symptoms.   She is on Aricept by her primary care provider.   Past Medical History  Diagnosis Date  . IBS (irritable bowel syndrome)   . Anxiety disorder   . Hyperlipidemia   . Lung nodule   . Angioneurotic edema not elsewhere classified   . Allergic rhinitis, cause unspecified   . Asthma   . Carpal tunnel syndrome   . Chronic abdominal pain   . GERD (gastroesophageal reflux disease)   . Insomnia   . Palpitations   . Osteoarthritis   . Senile osteoporosis   . Fecal incontinence   . Carotid bruit   . Pancytopenia ([place])     has Hyperlipemia; GENERALIZED ANXIETY DISORDER; CARPAL TUNNEL SYNDROME; UNSPECIFIED PERIPHERAL VERTIGO; TRANSIENT ISCHEMIC ATTACK; HEMORRHOIDS, WITH BLEEDING; Allergic rhinitis due to pollen; Allergic-infective asthma; LUNG NODULE; GERD; IRRITABLE BOWEL SYNDROME; CONTACT DERMATITIS&OTHER ECZEMA DUE UNSPEC CAUSE; OSTEOARTHRITIS; Hyposomnia, insomnia or sleeplessness associated with anxiety; PALPITATIONS; FECAL INCONTINENCE; Pancytopenia ([place]); Dry skin dermatitis; Cough variant asthma; Need for prophylactic  vaccination and inoculation against influenza; Osteopenia; Left ankle swelling; Dementia; Vitamin D deficiency; Constipation; Reason for consultation; and Encounter for consultation on her problem list.     is allergic to diphenhydramine hcl and rivastigmine tartrate.  Ms. Caloca does not currently have medications on file.  Past Surgical History  Procedure Laterality Date  . Abdominal hysterectomy  1979    fibroids   . Colonoscopy  12/21/04    normal -redundant   . Biopsy thyroid  1970   . Bilateral cataract surgery  2008  . Eus  08/07/07  . Eye surgery      Denies any headaches, dizziness, double vision, fevers, chills, night sweats, nausea, vomiting, diarrhea, constipation, chest pain, heart palpitations, shortness of breath, blood in stool, black tarry stool, urinary pain, urinary burning, urinary frequency, hematuria.   PHYSICAL EXAMINATION  ECOG PERFORMANCE STATUS: 0 - Asymptomatic  Filed Vitals:   07/27/15 1352  BP: 123/58  Pulse: 77  Temp: 97.8 F (36.6 C)  Resp: 16    GENERAL:alert, no distress, well nourished, well developed, comfortable, cooperative and smiling, accompanied by her daughter. SKIN: skin color, texture, turgor are normal, no rashes or significant lesions HEAD: Normocephalic, No masses, lesions, tenderness or abnormalities EYES: normal, PERRLA, EOMI, Conjunctiva are pink and non-injected EARS: External ears normal OROPHARYNX:mucous membranes are moist  NECK: supple, no adenopathy, thyroid normal size, non-tender, without nodularity, no stridor, non-tender, trachea midline LYMPH:  no palpable lymphadenopathy, no hepatosplenomegaly BREAST:not examined LUNGS: clear to auscultation and percussion HEART: regular rate & rhythm, no murmurs, no gallops, S1 normal and S2 normal ABDOMEN:abdomen soft, non-tender, normal bowel  sounds, no masses or organomegaly and no hepatosplenomegaly BACK: Back symmetric, no curvature., No CVA tenderness EXTREMITIES:less  then 2 second capillary refill, no joint deformities, effusion, or inflammation, no edema, no skin discoloration, no clubbing, no cyanosis, positive findings:  Hand joint deformities from arthritis.  NEURO: alert & oriented x 3 with fluent speech, no focal motor/sensory deficits, gait normal   LABORATORY DATA: CBC    Component Value Date/Time   WBC 4.4 07/27/2015 1315   RBC 3.75* 07/27/2015 1315   RBC 3.75* 07/27/2015 1315   HGB 12.6 07/27/2015 1315   HCT 38.0 07/27/2015 1315   PLT 148* 07/27/2015 1315   MCV 101.3* 07/27/2015 1315   MCH 33.6 07/27/2015 1315   MCHC 33.2 07/27/2015 1315   RDW 12.7 07/27/2015 1315   LYMPHSABS 1.4 07/27/2015 1315   MONOABS 0.4 07/27/2015 1315   EOSABS 0.1 07/27/2015 1315   BASOSABS 0.0 07/27/2015 1315      Chemistry      Component Value Date/Time   NA 140 07/27/2015 1315   K 3.6 07/27/2015 1315   CL 103 07/27/2015 1315   CO2 31 07/27/2015 1315   BUN 15 07/27/2015 1315   CREATININE 0.90 07/27/2015 1315   CREATININE 0.82 08/18/2014 0943      Component Value Date/Time   CALCIUM 9.5 07/27/2015 1315   ALKPHOS 36* 07/27/2015 1315   AST 32 07/27/2015 1315   ALT 24 07/27/2015 1315   BILITOT 0.7 07/27/2015 1315     Lab Results  Component Value Date   IRON 61 07/27/2014   TIBC 310 07/27/2014   FERRITIN 137 07/27/2014   Lab Results  Component Value Date   FOLATE >20.0 07/27/2014   Lab Results  Component Value Date   ERXVQMGQ67 619 07/27/2014   No results found for: ESRSEDRATE, POCTSEDRATE   RADIOGRAPHIC STUDIES:   ASSESSMENT AND PLAN:  Pancytopenia Intermittent pancytopenia since at least 2008 thus far without change.    Stable.    No need for bone marrow aspiration and biopsy to evaluate for MDS given patient's age and co-morbidities.    No need for ESA therapy at this time based upon lab values.    Labs today: CBC diff, CMET, LDH, ESR, CRP, anemia panel, retic count, EPO, MM panel, and B2M  Of note, she does have  osteopenia on bone density exam in 2014.  Renal function is WNL, Calcium is WNL, and anemia is > 10 g/dL.  She was recently released from Dr. Annamaria Boots, pulmonology, given her current and stable asthma and allergies.  These are now being managed by Dr. Moshe Cipro, primary care provider, who actually sees the patient tomorrow in her clinic.  I have reviewed Dr. Griffin Dakin notes and note the conversation amongst the patient's family members regarding family care of the patient.  Her cardiologist is Dr. Gwenlyn Found.  His note is reviewed and the patient is stable from a cardiac standpoint.  If labs are unimpressive today, then we will check labs next year.  Labs in 12 months: CBC diff, retic count  Return in 12 months for follow-up.  If labs are unimpressive today and in 12 months, we will release the patient from the clinic.   THERAPY PLAN:  Labs are stable.  There is no need to aggressively intervene with this patient given her age and co-morbidities.  CBC is stable and values are in a very safe range.    All questions were answered. The patient knows to call the clinic with any problems, questions or  concerns. We can certainly see the patient much sooner if necessary.  Patient and plan discussed with Dr. Ancil Linsey and she is in agreement with the aforementioned.   Robbie Nangle 07/27/2015 2:24 PM

## 2015-07-28 ENCOUNTER — Ambulatory Visit: Payer: Medicare Other | Admitting: Family Medicine

## 2015-07-28 LAB — KAPPA/LAMBDA LIGHT CHAINS
KAPPA FREE LGHT CHN: 28.89 mg/L — AB (ref 3.30–19.40)
Kappa, lambda light chain ratio: 1.36 (ref 0.26–1.65)
Lambda free light chains: 21.19 mg/L (ref 5.71–26.30)

## 2015-07-28 LAB — MULTIPLE MYELOMA PANEL, SERUM
ALBUMIN SERPL ELPH-MCNC: 3.8 g/dL (ref 2.9–4.4)
ALPHA 1: 0.3 g/dL (ref 0.0–0.4)
Albumin/Glob SerPl: 1 (ref 0.7–1.7)
Alpha2 Glob SerPl Elph-Mcnc: 0.7 g/dL (ref 0.4–1.0)
B-Globulin SerPl Elph-Mcnc: 1.4 g/dL — ABNORMAL HIGH (ref 0.7–1.3)
Gamma Glob SerPl Elph-Mcnc: 1.8 g/dL (ref 0.4–1.8)
Globulin, Total: 4.1 g/dL — ABNORMAL HIGH (ref 2.2–3.9)
IGA: 517 mg/dL — AB (ref 64–422)
IGG (IMMUNOGLOBIN G), SERUM: 1794 mg/dL — AB (ref 700–1600)
IGM, SERUM: 55 mg/dL (ref 26–217)
TOTAL PROTEIN ELP: 7.9 g/dL (ref 6.0–8.5)

## 2015-07-28 LAB — IGG, IGA, IGM
IGM, SERUM: 54 mg/dL (ref 26–217)
IgA: 502 mg/dL — ABNORMAL HIGH (ref 64–422)
IgG (Immunoglobin G), Serum: 1847 mg/dL — ABNORMAL HIGH (ref 700–1600)

## 2015-07-28 LAB — BETA 2 MICROGLOBULIN, SERUM: BETA 2 MICROGLOBULIN: 2.1 mg/L (ref 0.6–2.4)

## 2015-07-28 LAB — ERYTHROPOIETIN: ERYTHROPOIETIN: 14.1 m[IU]/mL (ref 2.6–18.5)

## 2015-07-29 LAB — PROTEIN ELECTROPHORESIS, SERUM
A/G RATIO SPE: 0.9 (ref 0.7–1.7)
ALBUMIN ELP: 3.7 g/dL (ref 2.9–4.4)
Alpha-1-Globulin: 0.3 g/dL (ref 0.0–0.4)
Alpha-2-Globulin: 0.7 g/dL (ref 0.4–1.0)
Beta Globulin: 1.4 g/dL — ABNORMAL HIGH (ref 0.7–1.3)
GLOBULIN, TOTAL: 4.1 g/dL — AB (ref 2.2–3.9)
Gamma Globulin: 1.7 g/dL (ref 0.4–1.8)
TOTAL PROTEIN ELP: 7.8 g/dL (ref 6.0–8.5)

## 2015-08-01 LAB — IMMUNOFIXATION ELECTROPHORESIS
IGA: 588 mg/dL — AB (ref 64–422)
IgG (Immunoglobin G), Serum: 2136 mg/dL — ABNORMAL HIGH (ref 700–1600)
IgM, Serum: 60 mg/dL (ref 26–217)
Total Protein ELP: 7.6 g/dL (ref 6.0–8.5)

## 2015-09-12 ENCOUNTER — Ambulatory Visit: Payer: Self-pay | Admitting: Family Medicine

## 2015-09-14 ENCOUNTER — Ambulatory Visit (INDEPENDENT_AMBULATORY_CARE_PROVIDER_SITE_OTHER): Payer: Medicare Other | Admitting: Family Medicine

## 2015-09-14 ENCOUNTER — Encounter: Payer: Self-pay | Admitting: Family Medicine

## 2015-09-14 VITALS — BP 134/72 | HR 74 | Resp 18 | Ht 66.0 in | Wt 134.0 lb

## 2015-09-14 DIAGNOSIS — K219 Gastro-esophageal reflux disease without esophagitis: Secondary | ICD-10-CM

## 2015-09-14 DIAGNOSIS — R002 Palpitations: Secondary | ICD-10-CM

## 2015-09-14 DIAGNOSIS — K589 Irritable bowel syndrome without diarrhea: Secondary | ICD-10-CM

## 2015-09-14 DIAGNOSIS — L850 Acquired ichthyosis: Secondary | ICD-10-CM

## 2015-09-14 DIAGNOSIS — J45991 Cough variant asthma: Secondary | ICD-10-CM

## 2015-09-14 DIAGNOSIS — E785 Hyperlipidemia, unspecified: Secondary | ICD-10-CM | POA: Diagnosis not present

## 2015-09-14 DIAGNOSIS — J301 Allergic rhinitis due to pollen: Secondary | ICD-10-CM

## 2015-09-14 DIAGNOSIS — E559 Vitamin D deficiency, unspecified: Secondary | ICD-10-CM

## 2015-09-14 DIAGNOSIS — F0391 Unspecified dementia with behavioral disturbance: Secondary | ICD-10-CM

## 2015-09-14 DIAGNOSIS — L853 Xerosis cutis: Secondary | ICD-10-CM

## 2015-09-14 MED ORDER — FLUTICASONE FUROATE 27.5 MCG/SPRAY NA SUSP: 2.0000 | Freq: Every day | NASAL | Status: DC

## 2015-09-14 MED ORDER — METOPROLOL SUCCINATE ER 25 MG PO TB24: 25.0000 mg | ORAL_TABLET | Freq: Every day | ORAL | Status: DC

## 2015-09-14 NOTE — Progress Notes (Signed)
   Subjective:    Patient ID: Kathleen Gallegos, female    DOB: 12-16-1925, 82 y.o.   MRN: [id number]  HPI   EMPRYSS NEWNUM     MRN: [id number]      DOB: 1926-03-16   HPI Ms. Kathleen Gallegos is here for follow up and re-evaluation of chronic medical conditions, medication management and review of any available recent lab and radiology data.  Preventive health is updated, specifically   Immunization.   Questions or concerns regarding consultations or procedures which the PT has had in the interim are  Addressed.She has been given no follow up appts per daughter's report The PT denies any adverse reactions to current medications since the last visit. She states she feels well. Daughter , [name] , who accompanies her , and who she lives with, states that she is becoming more irritable, and less interested in personal hygiene, now has up signs telling her which days to bathe and is still struggling with getting her to wash her hair on a regular basis. Many of her medications have been discontinued, no adverse effects are noted Appetite reportedly good, no problems with incontinence, constipation, less abdominal pain, still has dry skin complaint  ROS Denies recent fever or chills. Denies sinus pressure, nasal congestion, ear pain or sore throat. Denies chest congestion, productive cough or wheezing. Denies chest pains, palpitations and leg swelling Denies abdominal pain, nausea, vomiting,diarrhea or constipation.   Denies dysuria, frequency, hesitancy or incontinence. Denies joint pain, swelling and limitation in mobility. Denies headaches, seizures, numbness, or tingling. Denies depression, anxiety or insomnia. Denies skin break down or rash.   PE  BP 134/72 mmHg  Pulse 74  Resp 18  Ht 5\' 6"  (1.676 m)  Wt 134 lb (60.782 kg)  BMI 21.64 kg/m2  SpO2 98%  Patient alert and oriented and in no cardiopulmonary distress.  HEENT: No facial asymmetry, EOMI,   oropharynx pink and moist.   Neck adequate ROM, no adenopathy or mass  Chest: Clear to auscultation bilaterally.  CVS: S1, S2 no murmurs, no S3.Regular rate.  ABD: Soft non tender.   Ext: No edema  MS: Adequate though reduced  ROM spine, shoulders, hips and knees.  Skin: Intact, no ulcerations or rash noted.  Psych: Good eye contact, normal affect. Memory loss, nor anxious or depressed appearing, gets irritated when her daughter speaks  CNS: CN 2-12 intact, power,  normal throughout.no focal deficits noted.   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 I have asked pt to try to work on these areas and she agrees to do so, she will continue aricept at  Current dose  IRRITABLE BOWEL SYNDROME Denies significant flare up of symptoms  GERD Asymptomatic off medication  Dry skin dermatitis Unchanged, daily use of topical emollients recommended, and use of tepid bath water  PALPITATIONS Ongoing use of beta blocker,  Simplified , once daily dosing     Review of Systems     Objective:   Physical Exam        Assessment & Plan:

## 2015-09-14 NOTE — Patient Instructions (Addendum)
Annual wellness in 4 month, call if you need me sooner  Change in metoprolol , take one tablet ONCE daily  Please start once daily aspirin 81 mg  Please start one multivitamin eg chewable centrum (gummies) one daily  New is veramyst nasal pray for allergies daily   NO aGGRAVATION   Fasting labs needed asap

## 2015-09-15 ENCOUNTER — Other Ambulatory Visit: Payer: Self-pay

## 2015-09-15 MED ORDER — FLUTICASONE PROPIONATE 50 MCG/ACT NA SUSP: 2.0000 | Freq: Every day | NASAL | Status: DC

## 2015-09-17 DIAGNOSIS — E785 Hyperlipidemia, unspecified: Secondary | ICD-10-CM | POA: Diagnosis not present

## 2015-09-17 DIAGNOSIS — E559 Vitamin D deficiency, unspecified: Secondary | ICD-10-CM | POA: Diagnosis not present

## 2015-09-17 DIAGNOSIS — R002 Palpitations: Secondary | ICD-10-CM | POA: Diagnosis not present

## 2015-09-17 LAB — LIPID PANEL
CHOL/HDL RATIO: 2.2 ratio (ref <=5.0)
CHOLESTEROL: 227 mg/dL — AB (ref 125–200)
HDL: 103 mg/dL (ref >=46)
LDL Cholesterol: 115 mg/dL (ref <130)
TRIGLYCERIDES: 44 mg/dL (ref <150)
VLDL: 9 mg/dL (ref <30)

## 2015-09-17 LAB — TSH: TSH: 2.81 mIU/L

## 2015-09-18 NOTE — Assessment & Plan Note (Signed)
Unchanged, daily use of topical emollients recommended, and use of tepid bath water

## 2015-09-18 NOTE — Assessment & Plan Note (Signed)
Ongoing use of beta blocker,  Simplified , once daily dosing

## 2015-09-18 NOTE — Assessment & Plan Note (Signed)
Denies significant flare up of symptoms

## 2015-09-18 NOTE — Assessment & Plan Note (Signed)
Deterioration noted by her daughter as far as Armed forces logistics/support/administrative officer,  Grooming and irritability. I have asked pt to try to work on these areas and she agrees to do so, she will continue aricept at  Current dose

## 2015-09-18 NOTE — Assessment & Plan Note (Signed)
Asymptomatic off medication 

## 2015-09-18 NOTE — Assessment & Plan Note (Signed)
No recent or current flares,  On no medication at this time

## 2015-09-18 NOTE — Assessment & Plan Note (Signed)
Pt encouraged to resume daily flonase, esp as spring is approaching, and she has in the past required immunoterapy, both she and her daughter are aggreable

## 2015-09-19 LAB — VITAMIN D 25 HYDROXY (VIT D DEFICIENCY, FRACTURES): VIT D 25 HYDROXY: 32 ng/mL (ref 30–100)

## 2015-11-30 ENCOUNTER — Other Ambulatory Visit: Payer: Self-pay | Admitting: Family Medicine

## 2016-01-12 ENCOUNTER — Encounter: Payer: Self-pay | Admitting: Family Medicine

## 2016-01-12 ENCOUNTER — Telehealth: Payer: Self-pay | Admitting: Family Medicine

## 2016-01-12 NOTE — Telephone Encounter (Signed)
Pls type a letter stating pt refused to keep appointment today reporting that she had not been notified of this Also state that her daughter [name] who lives with her reports increased combative behavior and has openly expressed , her genuine concern , that Ms Detle[place]en would be able to receive better care in a facility where there are health care Professionals skilled in care of elderly . I will sign, [name] needs to get a copy also, she may collect letter

## 2016-01-12 NOTE — Telephone Encounter (Signed)
Kathleen Gallegos is being very combative now stating no one told her of her appointment today and Kathleen Gallegos said she told her multiple times, Kathleen Gallegos had to put her on the phone today to R/S her own appointment, Kathleen Gallegos is asking if Dr. Moshe Cipro would mind if she would write a letter to both of her sisters regarding Kathleen Gallegos Mental Abilities at this point, so her sisters are aware of what she is actually going through, please advise?

## 2016-01-14 NOTE — Telephone Encounter (Signed)
Letter typed

## 2016-01-20 ENCOUNTER — Other Ambulatory Visit: Payer: Self-pay | Admitting: Family Medicine

## 2016-01-20 DIAGNOSIS — R921 Mammographic calcification found on diagnostic imaging of breast: Secondary | ICD-10-CM

## 2016-01-24 ENCOUNTER — Other Ambulatory Visit: Payer: Self-pay | Admitting: Family Medicine

## 2016-01-24 DIAGNOSIS — Z09 Encounter for follow-up examination after completed treatment for conditions other than malignant neoplasm: Secondary | ICD-10-CM

## 2016-01-24 DIAGNOSIS — R921 Mammographic calcification found on diagnostic imaging of breast: Secondary | ICD-10-CM

## 2016-02-02 ENCOUNTER — Telehealth: Payer: Self-pay | Admitting: Family Medicine

## 2016-02-02 NOTE — Telephone Encounter (Signed)
Daughter called and wants to talk to [facility] - please call back. She wouldn't say what it was in regards to

## 2016-02-03 NOTE — Telephone Encounter (Signed)
Daughter called and was concerned that her mother had been talking about meeting oprah and going to church with her and she wanted you to know that she does this from time to time and wanted you to know. She is concerned. -

## 2016-02-04 NOTE — Telephone Encounter (Signed)
Noted, as long as she is no threat to herself and anyone , no action needs to be taken, already established that she has dementia

## 2016-02-07 ENCOUNTER — Ambulatory Visit (HOSPITAL_COMMUNITY)
Admission: RE | Admit: 2016-02-07 | Discharge: 2016-02-07 | Disposition: A | Discharge status: Home / Self Care | Payer: Medicare Other | Source: Ambulatory Visit | Attending: Family Medicine | Admitting: Family Medicine

## 2016-02-07 DIAGNOSIS — R921 Mammographic calcification found on diagnostic imaging of breast: Secondary | ICD-10-CM | POA: Insufficient documentation

## 2016-02-07 DIAGNOSIS — R928 Other abnormal and inconclusive findings on diagnostic imaging of breast: Secondary | ICD-10-CM | POA: Diagnosis present

## 2016-02-23 ENCOUNTER — Ambulatory Visit (INDEPENDENT_AMBULATORY_CARE_PROVIDER_SITE_OTHER): Payer: Medicare Other | Admitting: Family Medicine

## 2016-02-23 ENCOUNTER — Encounter: Payer: Self-pay | Admitting: Family Medicine

## 2016-02-23 VITALS — HR 67 | Resp 16 | Ht 66.0 in | Wt 130.0 lb

## 2016-02-23 DIAGNOSIS — Z0000 Encounter for general adult medical examination without abnormal findings: Secondary | ICD-10-CM | POA: Diagnosis not present

## 2016-02-23 DIAGNOSIS — Z23 Encounter for immunization: Secondary | ICD-10-CM | POA: Diagnosis not present

## 2016-02-23 NOTE — Progress Notes (Signed)
Subjective:    Kathleen Gallegos is a 82 y.o. female who presents for Medicare Annual/Subsequent preventive examination.  Preventive Screening-Counseling & Management  Tobacco History  Smoking Status  . Never Smoker  Smokeless Tobacco  . Never Used     Problems Prior to Visit 1.   Current Problems (verified) Patient Active Problem List   Diagnosis Date Noted  . Reason for consultation 05/27/2015  . Encounter for consultation 05/27/2015  . Vitamin D deficiency 03/12/2015  . Constipation 03/12/2015  . Dementia 01/04/2015  . Left ankle swelling 08/06/2014  . Osteopenia 08/05/2014  . Cough variant asthma 07/28/2013  . Dry skin dermatitis 02/15/2013  . Pancytopenia ([place]) 12/27/2011  . Hyposomnia, insomnia or sleeplessness associated with anxiety 12/04/2009  . UNSPECIFIED PERIPHERAL VERTIGO 11/28/2009  . HEMORRHOIDS, WITH BLEEDING 07/27/2009  . TRANSIENT ISCHEMIC ATTACK 03/29/2009  . PALPITATIONS 03/25/2009  . IRRITABLE BOWEL SYNDROME 10/05/2008  . FECAL INCONTINENCE 10/05/2008  . CONTACT DERMATITIS&OTHER ECZEMA DUE UNSPEC CAUSE 09/23/2008  . GERD 05/24/2008  . Hyperlipemia 11/10/2007  . GENERALIZED ANXIETY DISORDER 11/10/2007  . OSTEOARTHRITIS 11/10/2007  . Allergic rhinitis due to pollen 09/18/2007  . Allergic-infective asthma 09/18/2007  . LUNG NODULE 09/18/2007  . CARPAL TUNNEL SYNDROME 07/21/2007    Medications Prior to Visit Current Outpatient Prescriptions on File Prior to Visit  Medication Sig Dispense Refill  . aspirin 81 MG tablet Take 81 mg by mouth as needed. Reported on 09/14/2015    . donepezil (ARICEPT) 10 MG tablet TAKE ONE TABLET BY MOUTH ONCE DAILY AT BEDTIME 30 tablet 3  . fluticasone (FLONASE) 50 MCG/ACT nasal spray Place 2 sprays into both nostrils daily. 16 g 6  . metoprolol succinate (TOPROL-XL) 25 MG 24 hr tablet Take 1 tablet (25 mg total) by mouth daily. 30 tablet 5  . Vitamin D, Ergocalciferol, (DRISDOL) 50000 UNITS CAPS capsule TAKE ONE  CAPSULE BY MOUTH ONCE A WEEK 4 capsule 4   No current facility-administered medications on file prior to visit.     Current Medications (verified) Current Outpatient Prescriptions  Medication Sig Dispense Refill  . aspirin 81 MG tablet Take 81 mg by mouth as needed. Reported on 09/14/2015    . donepezil (ARICEPT) 10 MG tablet TAKE ONE TABLET BY MOUTH ONCE DAILY AT BEDTIME 30 tablet 3  . fluticasone (FLONASE) 50 MCG/ACT nasal spray Place 2 sprays into both nostrils daily. 16 g 6  . metoprolol succinate (TOPROL-XL) 25 MG 24 hr tablet Take 1 tablet (25 mg total) by mouth daily. 30 tablet 5  . Vitamin D, Ergocalciferol, (DRISDOL) 50000 UNITS CAPS capsule TAKE ONE CAPSULE BY MOUTH ONCE A WEEK 4 capsule 4   No current facility-administered medications for this visit.      Allergies (verified) Diphenhydramine hcl and Rivastigmine tartrate   PAST HISTORY  Family History Family History  Problem Relation Age of Onset  . Hypertension Mother   . Pancreatic cancer Father   . Cancer Brother   . Leukemia      family history   . Colon cancer      family history   . Depression Daughter     Social History Social History  Substance Use Topics  . Smoking status: Never Smoker  . Smokeless tobacco: Never Used  . Alcohol use No     Are there smokers in your home (other than you)? No  Risk Factors Current exercise habits: is active in home and walks when she goes shopping  Dietary issues discussed: Heart healthy low fat  diet;  Avoids fried foods    Cardiac risk factors: advanced age (older than 99 for men, 21 for women) and hypertension.  Depression Screen (Note: if answer to either of the following is "Yes", a more complete depression screening is indicated)   Over the past two weeks, have you felt down, depressed or hopeless? No  Over the past two weeks, have you felt little interest or pleasure in doing things? No  Have you lost interest or pleasure in daily life? No  Do you often  feel hopeless? No  Do you cry easily over simple problems? No but has periodic episodes   Activities of Daily Living In your present state of health, do you have any difficulty performing the following activities?:  Driving? Yes  Managing money?  Yes Feeding yourself? Yes Getting from bed to chair? NoNo exam performed today, annual wellness visit . Climbing a flight of stairs? No Preparing food and eating?: No Bathing or showering? No Getting dressed: No Getting to the toilet? No Using the toilet:No Moving around from place to place: No In the past year have you fallen or had a near fall?:No   Are you sexually active?  No  Do you have more than one partner?  No  Hearing Difficulties: No Do you often ask people to speak up or repeat themselves? No Do you experience ringing or noises in your ears? No Do you have difficulty understanding soft or whispered voices? No   Do you feel that you have a problem with memory? Yes  Do you often misplace items? Yes  Do you feel safe at home?  Yes  Cognitive Testing  Alert? Yes  Normal Appearance?Yes  Oriented to person? Yes  Place? Yes   Time? Yes  Recall of three objects?  No  Can perform simple calculations? Yes  Displays appropriate judgment?Yes  Can read the correct time from a watch face?Yes   Advanced Directives have been discussed with the patient? Yes and brochure/forms provided   List the Names of Other Physician/Practitioners you currently use: 1.  Dr. Gershon Crane - Opthamology 2.  Dr. Whitney Muse- Hematology   Indicate any recent Medical Services you may have received from other than [facility] providers in the past year (date may be approximate).  Immunization History  Administered Date(s) Administered  . Influenza Split 03/27/2011, 04/09/2012  . Influenza Whole 03/31/2007, 03/29/2009, 04/12/2009, 03/14/2010  . Influenza,inj,Quad PF,36+ Mos 03/31/2013, 04/07/2014, 03/08/2015  . Pneumococcal Conjugate-13 08/05/2014  . Pneumococcal  Polysaccharide-23 02/11/2006  . Td 04/18/2005    Screening Tests Health Maintenance  Topic Date Due  . INFLUENZA VACCINE  02/07/2016  . TETANUS/TDAP  10/25/2016 (Originally 04/19/2015)  . DEXA SCAN  Completed  . ZOSTAVAX  Completed  . PNA vac Low Risk Adult  Completed    All answers were reviewed with the patient and necessary referrals were made:  Tula Nakayama, MD   02/23/2016   History reviewed: current medications, past family history, past medical history, past social history, past surgical history and problem list  Review of Systems Review of systems not obtained due to patient factors.    Objective:     Vision by Snellen chart: right eye:20/30, left eye:20/30  BP 118/78   Pulse 67   Resp 16   Ht 5\' 6"  (1.676 m)   Wt 130 lb (59 kg)   SpO2 97%   BMI 20.98 kg/m    No exam performed today, annual wellness visit .     Assessment:  Plan:     During the course of the visit the patient was educated and counseled about appropriate screening and preventive services including:    Advanced directives: has NO advanced directive  - add't info requested. Referral to SW: no  Given fall prevention information   Diet review for nutrition referral? Yes ____  Not Indicated __x__   Patient Instructions (the written plan) was given to the patient.  Medicare Attestation I have personally reviewed: The patient's medical and social history Their use of alcohol, tobacco or illicit drugs Their current medications and supplements The patient's functional ability including ADLs,fall risks, home safety risks, cognitive, and hearing and visual impairment Diet and physical activities Evidence for depression or mood disorders  The patient's weight, height, BMI, and visual acuity have been recorded in the chart.  I have made referrals, counseling, and provided education to the patient based on review of the above and I have provided the patient with a written  personalized care plan for preventive services.     Tula Nakayama, MD   02/23/2016

## 2016-02-23 NOTE — Patient Instructions (Addendum)
It was a pleasure seeing you today.   Thank you for taking part in the Annual Wellness visit.   Please follow up with Dr. Moshe Cipro in 3 months.   Labs will be mailed to address to have done before next visit.    Fat and Cholesterol Restricted Diet High levels of fat and cholesterol in your blood may lead to various health problems, such as diseases of the heart, blood vessels, gallbladder, liver, and pancreas. Fats are concentrated sources of energy that come in various forms. Certain types of fat, including saturated fat, may be harmful in excess. Cholesterol is a substance needed by your body in small amounts. Your body makes all the cholesterol it needs. Excess cholesterol comes from the food you eat. When you have high levels of cholesterol and saturated fat in your blood, health problems can develop because the excess fat and cholesterol will gather along the walls of your blood vessels, causing them to narrow. Choosing the right foods will help you control your intake of fat and cholesterol. This will help keep the levels of these substances in your blood within normal limits and reduce your risk of disease. WHAT IS MY PLAN? Your health care provider recommends that you:  Get no more than __________ % of the total calories in your daily diet from fat.  Limit your intake of saturated fat to less than ______% of your total calories each day.  Limit the amount of cholesterol in your diet to less than _________mg per day. WHAT TYPES OF FAT SHOULD I CHOOSE?  Choose healthy fats more often. Choose monounsaturated and polyunsaturated fats, such as olive and canola oil, flaxseeds, walnuts, almonds, and seeds.  Eat more omega-3 fats. Good choices include salmon, mackerel, sardines, tuna, flaxseed oil, and ground flaxseeds. Aim to eat fish at least two times a week.  Limit saturated fats. Saturated fats are primarily found in animal products, such as meats, butter, and cream. Plant sources of  saturated fats include palm oil, palm kernel oil, and coconut oil.  Avoid foods with partially hydrogenated oils in them. These contain trans fats. Examples of foods that contain trans fats are stick margarine, some tub margarines, cookies, crackers, and other baked goods. WHAT GENERAL GUIDELINES DO I NEED TO FOLLOW? These guidelines for healthy eating will help you control your intake of fat and cholesterol:  Check food labels carefully to identify foods with trans fats or high amounts of saturated fat.  Fill one half of your plate with vegetables and green salads.  Fill one fourth of your plate with whole grains. Look for the word "whole" as the first word in the ingredient list.  Fill one fourth of your plate with lean protein foods.  Limit fruit to two servings a day. Choose fruit instead of juice.  Eat more foods that contain soluble fiber. Examples of foods that contain this type of fiber are apples, broccoli, carrots, beans, peas, and barley. Aim to get 20-30 g of fiber per day.  Eat more home-cooked food and less restaurant, buffet, and fast food.  Limit or avoid alcohol.  Limit foods high in starch and sugar.  Limit fried foods.  Cook foods using methods other than frying. Baking, boiling, grilling, and broiling are all great options.  Lose weight if you are overweight. Losing just 5-10% of your initial body weight can help your overall health and prevent diseases such as diabetes and heart disease. WHAT FOODS CAN I EAT? Grains Whole grains, such as whole  wheat or whole grain breads, crackers, cereals, and pasta. Unsweetened oatmeal, bulgur, barley, quinoa, or brown rice. Corn or whole wheat flour tortillas. Vegetables Fresh or frozen vegetables (raw, steamed, roasted, or grilled). Green salads. Fruits All fresh, canned (in natural juice), or frozen fruits. Meat and Other Protein Products Ground beef (85% or leaner), grass-fed beef, or beef trimmed of fat. Skinless  chicken or [place]. Ground chicken or [place]. Pork trimmed of fat. All fish and seafood. Eggs. Dried beans, peas, or lentils. Unsalted nuts or seeds. Unsalted canned or dry beans. Dairy Low-fat dairy products, such as skim or 1% milk, 2% or reduced-fat cheeses, low-fat ricotta or cottage cheese, or plain low-fat yogurt. Fats and Oils Tub margarines without trans fats. Light or reduced-fat mayonnaise and salad dressings. Avocado. Olive, canola, sesame, or safflower oils. Natural peanut or almond butter (choose ones without added sugar and oil). The items listed above may not be a complete list of recommended foods or beverages. Contact your dietitian for more options. WHAT FOODS ARE NOT RECOMMENDED? Grains White bread. White pasta. White rice. Cornbread. Bagels, pastries, and croissants. Crackers that contain trans fat. Vegetables White potatoes. Corn. Creamed or fried vegetables. Vegetables in a cheese sauce. Fruits Dried fruits. Canned fruit in light or heavy syrup. Fruit juice. Meat and Other Protein Products Fatty cuts of meat. Ribs, chicken wings, bacon, sausage, bologna, salami, chitterlings, fatback, hot dogs, bratwurst, and packaged luncheon meats. Liver and organ meats. Dairy Whole or 2% milk, cream, half-and-half, and cream cheese. Whole milk cheeses. Whole-fat or sweetened yogurt. Full-fat cheeses. Nondairy creamers and whipped toppings. Processed cheese, cheese spreads, or cheese curds. Sweets and Desserts Corn syrup, sugars, honey, and molasses. Candy. Jam and jelly. Syrup. Sweetened cereals. Cookies, pies, cakes, donuts, muffins, and ice cream. Fats and Oils Butter, stick margarine, lard, shortening, ghee, or bacon fat. Coconut, palm kernel, or palm oils. Beverages Alcohol. Sweetened drinks (such as sodas, lemonade, and fruit drinks or punches). The items listed above may not be a complete list of foods and beverages to avoid. Contact your dietitian for more information.   This  information is not intended to replace advice given to you by your health care provider. Make sure you discuss any questions you have with your health care provider.   Document Released: 06/25/2005 Document Revised: 07/16/2014 Document Reviewed: 09/23/2013 Elsevier Interactive Patient Education 2016 [place] in the Home  Falls can cause injuries. They can happen to people of all ages. There are many things you can do to make your home safe and to help prevent falls.  WHAT CAN I DO ON THE OUTSIDE OF MY HOME?  Regularly fix the edges of walkways and driveways and fix any cracks.  Remove anything that might make you trip as you walk through a door, such as a raised step or threshold.  Trim any bushes or trees on the path to your home.  Use bright outdoor lighting.  Clear any walking paths of anything that might make someone trip, such as rocks or tools.  Regularly check to see if handrails are loose or broken. Make sure that both sides of any steps have handrails.  Any raised decks and porches should have guardrails on the edges.  Have any leaves, snow, or ice cleared regularly.  Use sand or salt on walking paths during winter.  Clean up any spills in your garage right away. This includes oil or grease spills. WHAT CAN I DO IN THE BATHROOM?   Use  night lights.  Install grab bars by the toilet and in the tub and shower. Do not use towel bars as grab bars.  Use non-skid mats or decals in the tub or shower.  If you need to sit down in the shower, use a plastic, non-slip stool.  Keep the floor dry. Clean up any water that spills on the floor as soon as it happens.  Remove soap buildup in the tub or shower regularly.  Attach bath mats securely with double-sided non-slip rug tape.  Do not have throw rugs and other things on the floor that can make you trip. WHAT CAN I DO IN THE BEDROOM?  Use night lights.  Make sure that you have a light by your bed  that is easy to reach.  Do not use any sheets or blankets that are too big for your bed. They should not hang down onto the floor.  Have a firm chair that has side arms. You can use this for support while you get dressed.  Do not have throw rugs and other things on the floor that can make you trip. WHAT CAN I DO IN THE KITCHEN?  Clean up any spills right away.  Avoid walking on wet floors.  Keep items that you use a lot in easy-to-reach places.  If you need to reach something above you, use a strong step stool that has a grab bar.  Keep electrical cords out of the way.  Do not use floor polish or wax that makes floors slippery. If you must use wax, use non-skid floor wax.  Do not have throw rugs and other things on the floor that can make you trip. WHAT CAN I DO WITH MY STAIRS?  Do not leave any items on the stairs.  Make sure that there are handrails on both sides of the stairs and use them. Fix handrails that are broken or loose. Make sure that handrails are as long as the stairways.  Check any carpeting to make sure that it is firmly attached to the stairs. Fix any carpet that is loose or worn.  Avoid having throw rugs at the top or bottom of the stairs. If you do have throw rugs, attach them to the floor with carpet tape.  Make sure that you have a light switch at the top of the stairs and the bottom of the stairs. If you do not have them, ask someone to add them for you. WHAT ELSE CAN I DO TO HELP PREVENT FALLS?  Wear shoes that:  Do not have high heels.  Have rubber bottoms.  Are comfortable and fit you well.  Are closed at the toe. Do not wear sandals.  If you use a stepladder:  Make sure that it is fully opened. Do not climb a closed stepladder.  Make sure that both sides of the stepladder are locked into place.  Ask someone to hold it for you, if possible.  Clearly mark and make sure that you can see:  Any grab bars or handrails.  First and last  steps.  Where the edge of each step is.  Use tools that help you move around (mobility aids) if they are needed. These include:  Canes.  Walkers.  Scooters.  Crutches.  Turn on the lights when you go into a dark area. Replace any light bulbs as soon as they burn out.  Set up your furniture so you have a clear path. Avoid moving your furniture around.  If any of  your floors are uneven, fix them.  If there are any pets around you, be aware of where they are.  Review your medicines with your doctor. Some medicines can make you feel dizzy. This can increase your chance of falling. Ask your doctor what other things that you can do to help prevent falls.   This information is not intended to replace advice given to you by your health care provider. Make sure you discuss any questions you have with your health care provider.   Document Released: 04/21/2009 Document Revised: 11/09/2014 Document Reviewed: 07/30/2014 Elsevier Interactive Patient Education [employer].

## 2016-02-25 ENCOUNTER — Encounter: Payer: Self-pay | Admitting: Family Medicine

## 2016-02-25 NOTE — Assessment & Plan Note (Signed)

## 2016-03-01 ENCOUNTER — Telehealth: Payer: Self-pay | Admitting: Internal Medicine

## 2016-03-01 NOTE — Telephone Encounter (Signed)
Offer trial of prednisone 10 mg, # 7, 1 daily x 7 days. I will be out of town for next 2 weeks, so she will do better to see her PCP if the predniosne isn't enough.

## 2016-03-01 NOTE — Telephone Encounter (Signed)
Called spoke with daughter [name]. She reports she thinks pt is having allergy related symptoms.c/o runny nose, mostly dry cough. Pt is on flonase, allegra/mucinex PRN. She reports this is not an emergency and is requesting a work in appointment with Dr. Annamaria Boots in the next few weeks. Prefers Dr. Annamaria Boots over NP since he is her allergist per daughter. Please advise thanks  Allergies  Allergen Reactions  . Diphenhydramine Hcl Other (See Comments)    unknown  . Rivastigmine Tartrate Palpitations     Current Outpatient Prescriptions on File Prior to Visit  Medication Sig Dispense Refill  . aspirin 81 MG tablet Take 81 mg by mouth as needed. Reported on 09/14/2015    . donepezil (ARICEPT) 10 MG tablet TAKE ONE TABLET BY MOUTH ONCE DAILY AT BEDTIME 30 tablet 3  . fluticasone (FLONASE) 50 MCG/ACT nasal spray Place 2 sprays into both nostrils daily. (Patient not taking: Reported on 02/23/2016) 16 g 6  . metoprolol succinate (TOPROL-XL) 25 MG 24 hr tablet Take 1 tablet (25 mg total) by mouth daily. 30 tablet 5  . Vitamin D, Ergocalciferol, (DRISDOL) 50000 UNITS CAPS capsule TAKE ONE CAPSULE BY MOUTH ONCE A WEEK (Patient not taking: Reported on 02/23/2016) 4 capsule 4   No current facility-administered medications on file prior to visit.

## 2016-03-01 NOTE — Telephone Encounter (Signed)
Called spoke with daughter. Aware of recs below. She reports she will hold off on the prednisone for now. She will wait out and see how patient does. Nothing further needed

## 2016-05-15 ENCOUNTER — Ambulatory Visit: Payer: Self-pay | Admitting: Internal Medicine

## 2016-05-17 ENCOUNTER — Ambulatory Visit (INDEPENDENT_AMBULATORY_CARE_PROVIDER_SITE_OTHER): Payer: Self-pay | Admitting: Otolaryngology

## 2016-06-06 ENCOUNTER — Ambulatory Visit (INDEPENDENT_AMBULATORY_CARE_PROVIDER_SITE_OTHER): Payer: Medicare Other | Admitting: Family Medicine

## 2016-06-06 ENCOUNTER — Encounter: Payer: Self-pay | Admitting: Family Medicine

## 2016-06-06 VITALS — BP 122/80 | HR 76 | Resp 16 | Ht 66.0 in | Wt 137.0 lb

## 2016-06-06 DIAGNOSIS — Z1211 Encounter for screening for malignant neoplasm of colon: Secondary | ICD-10-CM | POA: Diagnosis not present

## 2016-06-06 DIAGNOSIS — R002 Palpitations: Secondary | ICD-10-CM

## 2016-06-06 DIAGNOSIS — Z23 Encounter for immunization: Secondary | ICD-10-CM | POA: Diagnosis not present

## 2016-06-06 DIAGNOSIS — E559 Vitamin D deficiency, unspecified: Secondary | ICD-10-CM

## 2016-06-06 DIAGNOSIS — F0391 Unspecified dementia with behavioral disturbance: Secondary | ICD-10-CM | POA: Diagnosis not present

## 2016-06-06 DIAGNOSIS — L853 Xerosis cutis: Secondary | ICD-10-CM

## 2016-06-06 DIAGNOSIS — E785 Hyperlipidemia, unspecified: Secondary | ICD-10-CM

## 2016-06-06 LAB — POC HEMOCCULT BLD/STL (OFFICE/1-CARD/DIAGNOSTIC): FECAL OCCULT BLD: NEGATIVE

## 2016-06-06 NOTE — Assessment & Plan Note (Signed)
Chronic and unchanged,no prescription med indicated

## 2016-06-06 NOTE — Patient Instructions (Addendum)
F/U in 4.5 months, call if you need me before  No changes in management  Your daughter will start your baths every morning  Rectal exam today  CBC, fasting lipid, cmp , TSH and Vit D in 4.5 months  Thank you  for choosing [facility]. We consider it a privelige to serve you.  Delivering excellent health care in a caring and  compassionate way is our goal.  Partnering with you,  so that together we can achieve this goal is our strategy.

## 2016-06-06 NOTE — Progress Notes (Signed)
   Kathleen Gallegos     MRN: [id number]      DOB: 21-Jun-1926   HPI Kathleen Gallegos is here for follow up and re-evaluation of chronic medical conditions, medication management and review of any available recent lab and radiology data.  Preventive health is updated, specifically  Cancer screening and Immunization.   ed. The PT denies any adverse reactions to current medications since the last visit.  There are no new concerns. From patient, daughter still reports aggressive non corporative behavior intermittently and remains concerned that her motjhher does not bathe as often as she should, Daughter Kathleen Gallegos states she no longer worries about lack of help from siblings and that she is doing the best that she can There are no specific complaints   ROS Denies recent fever or chills. Denies sinus pressure, nasal congestion, ear pain or sore throat. Denies chest congestion, productive cough or wheezing. Denies chest pains, palpitations and leg swelling Denies abdominal pain, nausea, vomiting,diarrhea or constipation.   Denies dysuria, frequency, hesitancy or incontinence. Denies joint pain, swelling and limitation in mobility. Denies headaches, seizures, numbness, or tingling. Denies depression, anxiety or insomnia. Denies skin break down or rash.   PE  BP 122/80   Pulse 76   Resp 16   Ht 5\' 6"  (1.676 m)   Wt 137 lb (62.1 kg)   SpO2 96%   BMI 22.11 kg/m   Patient alert and disoriented and in no cardiopulmonary distress.Severe memory loss  HEENT: No facial asymmetry, EOMI,   oropharynx pink and moist.  Neck supple no JVD, no mass.  Chest: Clear to auscultation bilaterally.  CVS: S1, S2 no murmurs, no S3.Regular rate.  ABD: Soft non tender.  Rectal ; no mass, heme negative stool Ext: No edema  MS: Adequate ROM spine, shoulders, hips and knees.  Skin: Intact, no ulcerations or rash noted.  Psych: Good eye contact, normal affect. Memory loss not anxious or depressed  appearing.  CNS: CN 2-12 intact, power,  normal throughout.no focal deficits noted.   Assessment & Plan  PALPITATIONS Controlled, no change in medication   Dementia Clinically stable will do <mMSE at next visit , continue aricept 10 mg daily Daughter to bathe her , [facility] [place] [facility]  Dry skin dermatitis Chronic and unchanged,no prescription med indicated

## 2016-06-06 NOTE — Assessment & Plan Note (Signed)
Controlled, no change in medication  

## 2016-06-06 NOTE — Assessment & Plan Note (Signed)
Clinically stable will do <mMSE at next visit , continue aricept 10 mg daily Daughter to bathe her , also [facility] [place] [facility]

## 2016-07-23 ENCOUNTER — Telehealth: Payer: Self-pay

## 2016-07-23 NOTE — Telephone Encounter (Signed)
FYI

## 2016-07-26 ENCOUNTER — Ambulatory Visit (HOSPITAL_COMMUNITY): Payer: Self-pay | Admitting: Hematology & Oncology

## 2016-07-26 ENCOUNTER — Other Ambulatory Visit (HOSPITAL_COMMUNITY): Payer: Self-pay

## 2016-08-03 ENCOUNTER — Telehealth: Payer: Self-pay

## 2016-08-03 NOTE — Telephone Encounter (Signed)
I will discuss this situation with risk management before any direct discussion with Cynthis, if she calls back let her know it will be addressed next week please

## 2016-08-03 NOTE — Telephone Encounter (Signed)
With her dx of dementia , she will need psychiatric evaluation to determine if this is the case

## 2016-08-03 NOTE — Telephone Encounter (Signed)
Spoke with daughter.  Advised that she seek advisement from her lawyer in the situation.   Patient does need psych eval to determine the extent of her dementia in decision making ability.

## 2016-08-03 NOTE — Telephone Encounter (Signed)
Spoke with daughter.  States that she would like to speak with pcp if possible.  They have an attorney that they are working with to complete advanced directives.

## 2016-08-29 ENCOUNTER — Other Ambulatory Visit: Payer: Self-pay | Admitting: Family Medicine

## 2016-09-26 ENCOUNTER — Ambulatory Visit (HOSPITAL_COMMUNITY): Payer: Self-pay

## 2016-09-26 ENCOUNTER — Other Ambulatory Visit (HOSPITAL_COMMUNITY): Payer: Self-pay

## 2016-10-09 DIAGNOSIS — E785 Hyperlipidemia, unspecified: Secondary | ICD-10-CM | POA: Diagnosis not present

## 2016-10-09 DIAGNOSIS — E559 Vitamin D deficiency, unspecified: Secondary | ICD-10-CM | POA: Diagnosis not present

## 2016-10-10 LAB — COMPREHENSIVE METABOLIC PANEL
ALBUMIN: 3.8 g/dL (ref 3.6–5.1)
ALT: 23 U/L (ref 6–29)
AST: 35 U/L (ref 10–35)
Alkaline Phosphatase: 29 U/L — ABNORMAL LOW (ref 33–130)
BILIRUBIN TOTAL: 0.7 mg/dL (ref 0.2–1.2)
BUN: 20 mg/dL (ref 7–25)
CHLORIDE: 102 mmol/L (ref 98–110)
CO2: 27 mmol/L (ref 20–31)
CREATININE: 0.92 mg/dL — AB (ref 0.60–0.88)
Calcium: 9.2 mg/dL (ref 8.6–10.4)
Glucose, Bld: 72 mg/dL (ref 65–99)
Potassium: 4.1 mmol/L (ref 3.5–5.3)
SODIUM: 140 mmol/L (ref 135–146)
TOTAL PROTEIN: 7.6 g/dL (ref 6.1–8.1)

## 2016-10-10 LAB — CBC
HEMATOCRIT: 37.3 % (ref 35.0–45.0)
Hemoglobin: 12.3 g/dL (ref 11.7–15.5)
MCH: 32.5 pg (ref 27.0–33.0)
MCHC: 33 g/dL (ref 32.0–36.0)
MCV: 98.7 fL (ref 80.0–100.0)
MPV: 11.3 fL (ref 7.5–12.5)
Platelets: 134 10*3/uL — ABNORMAL LOW (ref 140–400)
RBC: 3.78 MIL/uL — ABNORMAL LOW (ref 3.80–5.10)
RDW: 13.4 % (ref 11.0–15.0)
WBC: 4 10*3/uL (ref 3.8–10.8)

## 2016-10-10 LAB — LIPID PANEL
Cholesterol: 199 mg/dL (ref <200)
HDL: 87 mg/dL (ref >50)
LDL CALC: 103 mg/dL — AB (ref <100)
Total CHOL/HDL Ratio: 2.3 Ratio (ref <5.0)
Triglycerides: 43 mg/dL (ref <150)
VLDL: 9 mg/dL (ref <30)

## 2016-10-10 LAB — TSH: TSH: 2.39 mIU/L

## 2016-10-10 LAB — VITAMIN D 25 HYDROXY (VIT D DEFICIENCY, FRACTURES): Vit D, 25-Hydroxy: 28 ng/mL — ABNORMAL LOW (ref 30–100)

## 2016-10-18 ENCOUNTER — Encounter: Payer: Self-pay | Admitting: Family Medicine

## 2016-10-18 ENCOUNTER — Ambulatory Visit (INDEPENDENT_AMBULATORY_CARE_PROVIDER_SITE_OTHER): Payer: Medicare Other | Admitting: Family Medicine

## 2016-10-18 VITALS — BP 130/80 | HR 71 | Resp 15 | Ht 66.0 in | Wt 135.0 lb

## 2016-10-18 DIAGNOSIS — J301 Allergic rhinitis due to pollen: Secondary | ICD-10-CM | POA: Diagnosis not present

## 2016-10-18 DIAGNOSIS — R002 Palpitations: Secondary | ICD-10-CM | POA: Diagnosis not present

## 2016-10-18 DIAGNOSIS — E7849 Other hyperlipidemia: Secondary | ICD-10-CM

## 2016-10-18 DIAGNOSIS — F0390 Unspecified dementia without behavioral disturbance: Secondary | ICD-10-CM | POA: Diagnosis not present

## 2016-10-18 DIAGNOSIS — E784 Other hyperlipidemia: Secondary | ICD-10-CM | POA: Diagnosis not present

## 2016-10-18 NOTE — Patient Instructions (Addendum)
f/u with rectal first week in December  Wellness visit with [facility] August 19 or after  I will ask  Social worker Amy  to call to see if she can offer any help   You are doing very well, no changes in management      Careful not to fall  Fall Prevention in the Home Falls can cause injuries and can affect people from all age groups. There are many simple things that you can do to make your home safe and to help prevent falls. What can I do on the outside of my home?  Regularly repair the edges of walkways and driveways and fix any cracks.  Remove high doorway thresholds.  Trim any shrubbery on the main path into your home.  Use bright outdoor lighting.  Clear walkways of debris and clutter, including tools and rocks.  Regularly check that handrails are securely fastened and in good repair. Both sides of any steps should have handrails.  Install guardrails along the edges of any raised decks or porches.  Have leaves, snow, and ice cleared regularly.  Use sand or salt on walkways during winter months.  In the garage, clean up any spills right away, including grease or oil spills. What can I do in the bathroom?  Use night lights.  Install grab bars by the toilet and in the tub and shower. Do not use towel bars as grab bars.  Use non-skid mats or decals on the floor of the tub or shower.  If you need to sit down while you are in the shower, use a plastic, non-slip stool.  Keep the floor dry. Immediately clean up any water that spills on the floor.  Remove soap buildup in the tub or shower on a regular basis.  Attach bath mats securely with double-sided non-slip rug tape.  Remove throw rugs and other tripping hazards from the floor. What can I do in the bedroom?  Use night lights.  Make sure that a bedside light is easy to reach.  Do not use oversized bedding that drapes onto the floor.  Have a firm chair that has side arms to use for getting  dressed.  Remove throw rugs and other tripping hazards from the floor. What can I do in the kitchen?  Clean up any spills right away.  Avoid walking on wet floors.  Place frequently used items in easy-to-reach places.  If you need to reach for something above you, use a sturdy step stool that has a grab bar.  Keep electrical cables out of the way.  Do not use floor polish or wax that makes floors slippery. If you have to use wax, make sure that it is non-skid floor wax.  Remove throw rugs and other tripping hazards from the floor. What can I do in the stairways?  Do not leave any items on the stairs.  Make sure that there are handrails on both sides of the stairs. Fix handrails that are broken or loose. Make sure that handrails are as long as the stairways.  Check any carpeting to make sure that it is firmly attached to the stairs. Fix any carpet that is loose or worn.  Avoid having throw rugs at the top or bottom of stairways, or secure the rugs with carpet tape to prevent them from moving.  Make sure that you have a light switch at the top of the stairs and the bottom of the stairs. If you do not have them, have them installed.  What are some other fall prevention tips?  Wear closed-toe shoes that fit well and support your feet. Wear shoes that have rubber soles or low heels.  When you use a stepladder, make sure that it is completely opened and that the sides are firmly locked. Have someone hold the ladder while you are using it. Do not climb a closed stepladder.  Add color or contrast paint or tape to grab bars and handrails in your home. Place contrasting color strips on the first and last steps.  Use mobility aids as needed, such as canes, walkers, scooters, and crutches.  Turn on lights if it is dark. Replace any light bulbs that burn out.  Set up furniture so that there are clear paths. Keep the furniture in the same spot.  Fix any uneven floor surfaces.  Choose a  carpet design that does not hide the edge of steps of a stairway.  Be aware of any and all pets.  Review your medicines with your healthcare provider. Some medicines can cause dizziness or changes in blood pressure, which increase your risk of falling. Talk with your health care provider about other ways that you can decrease your risk of falls. This may include working with a physical therapist or trainer to improve your strength, balance, and endurance. This information is not intended to replace advice given to you by your health care provider. Make sure you discuss any questions you have with your health care provider. Document Released: 06/15/2002 Document Revised: 11/22/2015 Document Reviewed: 07/30/2014 Elsevier Interactive Patient Education  2017 [employer].

## 2016-10-19 NOTE — Assessment & Plan Note (Signed)
Mild symptoms , managed by OTC medication as needed

## 2016-10-19 NOTE — Assessment & Plan Note (Signed)
Controlled, no change in medication  

## 2016-10-19 NOTE — Assessment & Plan Note (Signed)
Hyperlipidemia:Low fat diet discussed and encouraged.   Lipid Panel  Lab Results  Component Value Date   CHOL 199 10/09/2016   HDL 87 10/09/2016   LDLCALC 103 (H) 10/09/2016   TRIG 43 10/09/2016   CHOLHDL 2.3 10/09/2016   Excellent and on no medication, diet managed

## 2016-10-19 NOTE — Progress Notes (Signed)
   Kathleen Gallegos     MRN: [id number]      DOB: October 31, 1925   HPI Kathleen Gallegos is here for follow up and re-evaluation of chronic medical conditions, medication management and review of any available recent lab and radiology data.  Preventive health is updated, specifically    Immunization.   The PT denies any adverse reactions to current medications since the last visit.  Daughter has question as to whether her Mother has a dx of dementia, rept MMSE on aricept 10 mg daily has a score of 19 she is assured that she does have dementia Less combative behavior witnessed between them in office and responsible daughter coping better with caring for her Mother, also states that she is still looking for a place to move to so that her Mother can sell her property Does leave her on her own at times, and there is an opportunity for social worker involvement to see if assistance with either housing or sitting is an option  ROS Denies recent fever or chills. Denies sinus pressure, nasal congestion, ear pain or sore throat. Denies chest congestion, productive cough or wheezing. Denies chest pains, palpitations and leg swelling Denies abdominal pain, nausea, vomiting,diarrhea or constipation.   Denies dysuria, frequency, hesitancy or incontinence. Denies joint pain, swelling and limitation in mobility. Denies headaches, seizures, numbness, or tingling. Denies depression, anxiety or insomnia. Denies skin break down or rash.Has dry skin   PE  BP 130/80   Pulse 71   Resp 15   Ht 5\' 6"  (1.676 m)   Wt 135 lb (61.2 kg)   SpO2 97%   BMI 21.79 kg/m   Patient alert and oriented and in no cardiopulmonary distress.  HEENT: No facial asymmetry, EOMI,   oropharynx pink and moist.  Neck supple no JVD, no mass.  Chest: Clear to auscultation bilaterally.  CVS: S1, S2 no murmurs, no S3.Regular rate.  ABD: Soft non tender.   Ext: No edema  MS: Adequate ROM spine, shoulders, hips and knees.  Skin:  Intact, no ulcerations or rash noted.  Psych: Good eye contact, normal affect. Memory markedly impaired not anxious or depressed appearing.  CNS: CN 2-12 intact, power,  normal throughout.no focal deficits noted.   Assessment & Plan  PALPITATIONS Controlled, no change in medication   Allergic rhinitis due to pollen Mild symptoms , managed by OTC medication as needed  Hyperlipemia Hyperlipidemia:Low fat diet discussed and encouraged.   Lipid Panel  Lab Results  Component Value Date   CHOL 199 10/09/2016   HDL 87 10/09/2016   LDLCALC 103 (H) 10/09/2016   TRIG 43 10/09/2016   CHOLHDL 2.3 10/09/2016   Excellent and on no medication, diet managed

## 2016-10-30 ENCOUNTER — Telehealth: Payer: Self-pay | Admitting: Family Medicine

## 2016-10-30 NOTE — Telephone Encounter (Signed)
Patients daughter Crystal (on hipaa-ok to discuss treatment) called to request a phone meeting or appointment regarding her patients care. She lives in [place] and was not able to attend the appointment with her other family members to discuss patients treatment. She has questions she would like to discuss with you.  Call back number [phone]

## 2016-10-30 NOTE — Telephone Encounter (Signed)
Message left on p[t's phone attempted to return the call

## 2016-10-31 NOTE — Telephone Encounter (Signed)
Spoke with [employer]	[place] concern is competence of mom to make decisions as well as dx and management of her dementia, Advised she speak wit her lawyer and that psychiatry is the one who declares pt incompetent, Also given next ppt info

## 2016-10-31 NOTE — Telephone Encounter (Signed)
I spoke wih the patient's daughter

## 2016-11-01 ENCOUNTER — Ambulatory Visit (HOSPITAL_COMMUNITY): Payer: Self-pay

## 2016-11-01 ENCOUNTER — Other Ambulatory Visit (HOSPITAL_COMMUNITY): Payer: Self-pay

## 2016-11-20 ENCOUNTER — Encounter (HOSPITAL_COMMUNITY): Payer: Self-pay

## 2016-11-20 ENCOUNTER — Encounter (HOSPITAL_BASED_OUTPATIENT_CLINIC_OR_DEPARTMENT_OTHER): Payer: Medicare Other | Admitting: Oncology

## 2016-11-20 ENCOUNTER — Encounter (HOSPITAL_COMMUNITY): Payer: Medicare Other | Attending: Oncology

## 2016-11-20 VITALS — BP 133/74 | HR 77 | Temp 98.1°F | Resp 16 | Wt 136.6 lb

## 2016-11-20 DIAGNOSIS — D61818 Other pancytopenia: Secondary | ICD-10-CM

## 2016-11-20 LAB — RETICULOCYTES
RBC.: 3.5 MIL/uL — AB (ref 3.87–5.11)
Retic Count, Absolute: 42 10*3/uL (ref 19.0–186.0)
Retic Ct Pct: 1.2 % (ref 0.4–3.1)

## 2016-11-20 LAB — CBC WITH DIFFERENTIAL/PLATELET
Basophils Absolute: 0 K/uL (ref 0.0–0.1)
Basophils Relative: 0 %
Eosinophils Absolute: 0.1 K/uL (ref 0.0–0.7)
Eosinophils Relative: 2 %
HCT: 35 % — ABNORMAL LOW (ref 36.0–46.0)
Hemoglobin: 11.6 g/dL — ABNORMAL LOW (ref 12.0–15.0)
Lymphocytes Relative: 34 %
Lymphs Abs: 1.5 K/uL (ref 0.7–4.0)
MCH: 33.1 pg (ref 26.0–34.0)
MCHC: 33.1 g/dL (ref 30.0–36.0)
MCV: 100 fL (ref 78.0–100.0)
Monocytes Absolute: 0.6 K/uL (ref 0.1–1.0)
Monocytes Relative: 13 %
Neutro Abs: 2.3 K/uL (ref 1.7–7.7)
Neutrophils Relative %: 51 %
Platelets: 123 K/uL — ABNORMAL LOW (ref 150–400)
RBC: 3.5 MIL/uL — ABNORMAL LOW (ref 3.87–5.11)
RDW: 13 % (ref 11.5–15.5)
WBC: 4.5 K/uL (ref 4.0–10.5)

## 2016-11-20 NOTE — Patient Instructions (Addendum)
[place] at [facility] Discharge Instructions  RECOMMENDATIONS MADE BY THE CONSULTANT AND ANY TEST RESULTS WILL BE SENT TO YOUR REFERRING PHYSICIAN.  You were seen today by Dr. Twana First No need for follow up appointments   Thank you for choosing [place] at [facility] to provide your oncology and hematology care.  To afford each patient quality time with our provider, please arrive at least 15 minutes before your scheduled appointment time.    If you have a lab appointment with the [facility] please come in thru the  Main Entrance and check in at the main information desk  You need to re-schedule your appointment should you arrive 10 or more minutes late.  We strive to give you quality time with our providers, and arriving late affects you and other patients whose appointments are after yours.  Also, if you no show three or more times for appointments you may be dismissed from the clinic at the providers discretion.     Again, thank you for choosing [facility].  Our hope is that these requests will decrease the amount of time that you wait before being seen by our physicians.       _____________________________________________________________  Should you have questions after your visit to [facility], please contact our office at [phone] between the hours of 8:30 a.m. and 4:30 p.m.  Voicemails left after 4:30 p.m. will not be returned until the following business day.  For prescription refill requests, have your pharmacy contact our office.       Resources For Cancer Patients and their Caregivers ? American Cancer Society: Can assist with transportation, wigs, general needs, runs Look Good Feel Better.        564-361-3016 ? Cancer Care: Provides financial assistance, online support groups, medication/co-pay assistance.  1-800-813-HOPE (509)650-0915) ? [place] Assists  [place] cancer patients and their families through emotional , educational and financial support.  216 729 3759 ? [place] DSS Where to apply for food stamps, Medicaid and utility assistance. 661 482 6166 ? [facility]: Transportation to medical appointments. 9020849259 ? Social Security Administration: May apply for disability if have a Stage IV cancer. 4327653338 606-711-8211 ? [employer], Disability and Transit Services: Assists with nutrition, care and transit needs. [place] Support Programs: @10RELATIVEDAYS @ > Cancer Support Group  2nd Tuesday of the month 1pm-2pm, Journey Room  > Creative Journey  3rd Tuesday of the month 1130am-1pm, Journey Room  > Look Good Feel Better  1st Wednesday of the month 10am-12 noon, Journey Room (Call [facility] to register [phone])

## 2016-11-20 NOTE — Progress Notes (Signed)
Kathleen Helper, MD [address] [place] [address]  CURRENT THERAPY: Surveillance  INTERVAL HISTORY: Kathleen Gallegos 83 y.o. female returns for followup of intermittent pancytopenia dating back to at least 2008 without any changes.   The patient is accompanied by her oldest daughter today. She reports she feels very well. The patient denies any chest pain, breathing difficulty, abdominal pain, or change in appetite. No bleeding or bruising.  She reports her bowel activity is normal.   We reviewed the patient's most recent lab work today.    Review of Systems  Constitutional: Negative.  Negative for weight loss.  HENT: Negative.   Eyes: Negative.   Respiratory: Negative.  Negative for cough, shortness of breath and wheezing.   Cardiovascular: Negative.  Negative for chest pain.  Gastrointestinal: Negative.  Negative for abdominal pain, constipation and diarrhea.  Genitourinary: Negative.   Musculoskeletal: Negative.   Skin: Negative.   Neurological: Negative.   Endo/Heme/Allergies: Negative.   Psychiatric/Behavioral: Negative.   All other systems reviewed and are negative.    Past Medical History:  Diagnosis Date  . Allergic rhinitis, cause unspecified   . Angioneurotic edema not elsewhere classified   . Anxiety disorder   . Asthma   . Carotid bruit   . Carpal tunnel syndrome   . Chronic abdominal pain   . Fecal incontinence   . GERD (gastroesophageal reflux disease)   . Hyperlipidemia   . IBS (irritable bowel syndrome)   . Insomnia   . Lung nodule   . Osteoarthritis   . Palpitations   . Pancytopenia ([place])   . Senile osteoporosis     has Hyperlipemia; TRANSIENT ISCHEMIC ATTACK; HEMORRHOIDS, WITH BLEEDING; Allergic rhinitis due to pollen; LUNG NODULE; GERD; IRRITABLE BOWEL SYNDROME; OSTEOARTHRITIS; PALPITATIONS; FECAL INCONTINENCE; Pancytopenia ([place]); Dry skin dermatitis; Cough variant asthma; Osteopenia; Dementia; and Vitamin D  deficiency on her problem list.     is allergic to diphenhydramine hcl and rivastigmine tartrate.  Kathleen Gallegos does not currently have medications on file.  Past Surgical History:  Procedure Laterality Date  . ABDOMINAL HYSTERECTOMY  1979   fibroids   . bilateral cataract surgery  2008  . BIOPSY THYROID  1970   . COLONOSCOPY  12/21/04   normal -redundant   . EUS  08/07/07  . EYE SURGERY       Family History  Problem Relation Age of Onset  . Hypertension Mother   . Pancreatic cancer Father   . Cancer Brother   . Depression Daughter   . Heart disease Sister   . Leukemia Unknown        family history   . Colon cancer Unknown        family history    Social History   Social History  . Marital status: Widowed    Spouse name: N/A  . Number of children: 3  . Years of education: N/A   Occupational History  . retired Retired   Social History Main Topics  . Smoking status: Never Smoker  . Smokeless tobacco: Former Systems developer    Types: Snuff  . Alcohol use No  . Drug use: No  . Sexual activity: No   Other Topics Concern  . Not on file   Social History Narrative  . No narrative on file  The patient's daughter lives with her currently and helps her with tasks as needed. The patient reports she maintains most of her independence.  PHYSICAL EXAMINATION  ECOG PERFORMANCE STATUS: 0 - Asymptomatic  Vitals:   11/20/16 1356  BP: 133/74  Pulse: 77  Resp: 16  Temp: 98.1 F (36.7 C)    Physical Exam  Constitutional: She is oriented to person, place, and time. She appears well-developed and well-nourished.  HENT:  Head: Normocephalic and atraumatic.  Cardiovascular: Normal rate, regular rhythm and normal heart sounds.  Exam reveals no gallop and no friction rub.   No murmur heard. Pulmonary/Chest: Effort normal and breath sounds normal. No respiratory distress.  Abdominal: Soft. Bowel sounds are normal. She exhibits no distension and no mass. There is no tenderness.    Musculoskeletal: Normal range of motion.  Neurological: She is alert and oriented to person, place, and time.  Psychiatric: She has a normal mood and affect. Her behavior is normal. Judgment and thought content normal.    LABORATORY DATA: I have reviewed the data as follows:  CBC    Component Value Date/Time   WBC 4.0 10/09/2016 1432   RBC 3.78 (L) 10/09/2016 1432   HGB 12.3 10/09/2016 1432   HCT 37.3 10/09/2016 1432   PLT 134 (L) 10/09/2016 1432   MCV 98.7 10/09/2016 1432   MCH 32.5 10/09/2016 1432   MCHC 33.0 10/09/2016 1432   RDW 13.4 10/09/2016 1432   LYMPHSABS 1.4 07/27/2015 1315   MONOABS 0.4 07/27/2015 1315   EOSABS 0.1 07/27/2015 1315   BASOSABS 0.0 07/27/2015 1315      Chemistry      Component Value Date/Time   NA 140 10/09/2016 1432   K 4.1 10/09/2016 1432   CL 102 10/09/2016 1432   CO2 27 10/09/2016 1432   BUN 20 10/09/2016 1432   CREATININE 0.92 (H) 10/09/2016 1432      Component Value Date/Time   CALCIUM 9.2 10/09/2016 1432   ALKPHOS 29 (L) 10/09/2016 1432   AST 35 10/09/2016 1432   ALT 23 10/09/2016 1432   BILITOT 0.7 10/09/2016 1432     Lab Results  Component Value Date   IRON 77 07/27/2015   TIBC 350 07/27/2015   FERRITIN 103 07/27/2015   Lab Results  Component Value Date   FOLATE 33.2 07/27/2015   Lab Results  Component Value Date   FOYDXAJO87 867 07/27/2015   No results found for: ESRSEDRATE, POCTSEDRATE   RADIOGRAPHIC STUDIES: I have reviewed the images below and agree with the reported results.  Bilateral Mammogram 02/07/16 IMPRESSION: Stable benign left breast calcifications and stable breast parenchymal pattern.  ASSESSMENT AND PLAN:  Intermittent pancytopenia dating back to at least 2008 without any changes  PLAN: Reviewed the patient's lab work with the patient and her daughter.   Labs are stable. There is no need to aggressively intervene with this patient given her age and co-morbidities. CBC is stable and she only  has a mild thrombocytopenia and anemia at this time.   I discussed turning her care back over to her PCP for continued routine monitoring of her CBC and she is agreeable with this plan. She follows closely with Dr. Moshe Cipro every 4-6 months.   The patient will follow up with me on an as needed basis.     All questions were answered. The patient knows to call the clinic with any problems, questions or concerns. We can certainly see the patient much sooner if necessary.  This document serves as a record of services personally performed by Twana First, MD. It was created on her behalf by Maryla Morrow, a trained medical scribe. The creation of  this record is based on the scribe's personal observations and the provider's statements to them. This document has been checked and approved by the attending provider.  This note was electronically signed by:  Twana First, MD 11/20/2016 2:15 PM

## 2016-12-06 ENCOUNTER — Telehealth: Payer: Self-pay | Admitting: Family Medicine

## 2016-12-06 DIAGNOSIS — F0390 Unspecified dementia without behavioral disturbance: Secondary | ICD-10-CM

## 2016-12-06 NOTE — Telephone Encounter (Signed)
Kathleen Gallegos called in about the paperwork she brought regarding her mother ( I left her a message yesterday telling her it was for medicaid patients) She states she spoke with [facility] today and they told her to request an order for private care.  Cb#: to discuss if needed: [phone]

## 2016-12-12 DIAGNOSIS — R419 Unspecified symptoms and signs involving cognitive functions and awareness: Secondary | ICD-10-CM | POA: Diagnosis not present

## 2016-12-12 NOTE — Telephone Encounter (Signed)
Filled out the requested rx and faxed back to [facility]

## 2016-12-17 ENCOUNTER — Other Ambulatory Visit: Payer: Self-pay | Admitting: Family Medicine

## 2016-12-17 DIAGNOSIS — R002 Palpitations: Secondary | ICD-10-CM

## 2016-12-18 DIAGNOSIS — H04123 Dry eye syndrome of bilateral lacrimal glands: Secondary | ICD-10-CM | POA: Diagnosis not present

## 2016-12-18 DIAGNOSIS — Z961 Presence of intraocular lens: Secondary | ICD-10-CM | POA: Diagnosis not present

## 2016-12-24 DIAGNOSIS — R419 Unspecified symptoms and signs involving cognitive functions and awareness: Secondary | ICD-10-CM | POA: Insufficient documentation

## 2016-12-31 ENCOUNTER — Other Ambulatory Visit: Payer: Self-pay | Admitting: Family Medicine

## 2017-01-03 ENCOUNTER — Other Ambulatory Visit: Payer: Self-pay | Admitting: Family Medicine

## 2017-01-03 DIAGNOSIS — Z1231 Encounter for screening mammogram for malignant neoplasm of breast: Secondary | ICD-10-CM

## 2017-01-08 ENCOUNTER — Other Ambulatory Visit: Payer: Self-pay

## 2017-02-14 ENCOUNTER — Ambulatory Visit (HOSPITAL_COMMUNITY): Payer: Self-pay

## 2017-02-21 ENCOUNTER — Ambulatory Visit (HOSPITAL_COMMUNITY): Payer: Self-pay

## 2017-02-25 ENCOUNTER — Ambulatory Visit (HOSPITAL_COMMUNITY): Payer: Self-pay

## 2017-02-27 ENCOUNTER — Ambulatory Visit: Payer: Self-pay

## 2017-03-18 ENCOUNTER — Telehealth: Payer: Self-pay

## 2017-03-18 ENCOUNTER — Ambulatory Visit (HOSPITAL_COMMUNITY): Payer: Self-pay

## 2017-03-18 NOTE — Telephone Encounter (Signed)
Left vm to reschedule awv. -nr 

## 2017-03-20 ENCOUNTER — Ambulatory Visit: Payer: Self-pay

## 2017-03-27 ENCOUNTER — Ambulatory Visit: Payer: Self-pay

## 2017-04-08 ENCOUNTER — Telehealth: Payer: Self-pay | Admitting: Family Medicine

## 2017-04-08 NOTE — Telephone Encounter (Signed)
[name] called to speak to Dr Moshe Cipro.  She is at her wits end caring for her elderly mother with dementia.  She says her mother has become more "mean" and uncooperative lately.  She states her sisters do not help.  She feels like "something has to change".  I agreed that being a caregiver is exhausting and often unappreciated.  I told her I would give Dr Moshe Cipro a message to call her.

## 2017-04-16 ENCOUNTER — Telehealth: Payer: Self-pay | Admitting: Family Medicine

## 2017-04-16 NOTE — Telephone Encounter (Signed)
Patients family member called in to let doctor simpson know that patient has a lump under her arm with a black spot in the middle, apears to be sunken into her skin, not swollen, leaks fluid  Cb#: [phone]  No available appts at this time until the end of month/nov

## 2017-04-16 NOTE — Telephone Encounter (Signed)
Are there any openings available with another provider in the office sooner to evaluate , please let me know if not

## 2017-04-25 ENCOUNTER — Telehealth: Payer: Self-pay | Admitting: Family Medicine

## 2017-04-25 ENCOUNTER — Ambulatory Visit (HOSPITAL_COMMUNITY)
Admission: RE | Admit: 2017-04-25 | Discharge: 2017-04-25 | Disposition: A | Discharge status: Home / Self Care | Payer: Medicare Other | Source: Ambulatory Visit | Attending: Family Medicine | Admitting: Family Medicine

## 2017-04-25 ENCOUNTER — Ambulatory Visit (HOSPITAL_COMMUNITY): Payer: Self-pay

## 2017-04-25 ENCOUNTER — Encounter (HOSPITAL_COMMUNITY): Payer: Self-pay

## 2017-04-25 DIAGNOSIS — Z1231 Encounter for screening mammogram for malignant neoplasm of breast: Secondary | ICD-10-CM | POA: Insufficient documentation

## 2017-04-25 NOTE — Telephone Encounter (Signed)
[name] (pt's daughter) is not able to bring her mother in for a Wellness Visit on Mondays.  This interferes with her bible study.  I offered 2 appts to see Kathleen Gallegos before her last day (one was Monday Oct 29th and Oct 31st on Wednesday) but because Mondays are out and the 31st of Oct the daughter has a dental appt she is unable to schedule either appt.  She is asking if this appt is necessary or if you can make an exception and allow another day for the Wellness visit.

## 2017-05-01 ENCOUNTER — Ambulatory Visit: Payer: Self-pay

## 2017-05-10 ENCOUNTER — Ambulatory Visit: Payer: Self-pay

## 2017-06-04 ENCOUNTER — Ambulatory Visit: Payer: Self-pay

## 2017-06-10 ENCOUNTER — Ambulatory Visit: Payer: Self-pay

## 2017-06-11 ENCOUNTER — Encounter: Payer: Self-pay | Admitting: Family Medicine

## 2017-06-12 ENCOUNTER — Encounter: Payer: Self-pay | Admitting: Family Medicine

## 2017-06-12 ENCOUNTER — Ambulatory Visit (INDEPENDENT_AMBULATORY_CARE_PROVIDER_SITE_OTHER): Payer: Medicare Other | Admitting: Family Medicine

## 2017-06-12 VITALS — BP 112/68 | HR 80 | Resp 16 | Ht 66.0 in | Wt 131.0 lb

## 2017-06-12 DIAGNOSIS — F0390 Unspecified dementia without behavioral disturbance: Secondary | ICD-10-CM

## 2017-06-12 DIAGNOSIS — K219 Gastro-esophageal reflux disease without esophagitis: Secondary | ICD-10-CM

## 2017-06-12 DIAGNOSIS — J301 Allergic rhinitis due to pollen: Secondary | ICD-10-CM | POA: Diagnosis not present

## 2017-06-12 DIAGNOSIS — E7849 Other hyperlipidemia: Secondary | ICD-10-CM

## 2017-06-12 DIAGNOSIS — Z1211 Encounter for screening for malignant neoplasm of colon: Secondary | ICD-10-CM | POA: Diagnosis not present

## 2017-06-12 DIAGNOSIS — Z23 Encounter for immunization: Secondary | ICD-10-CM | POA: Diagnosis not present

## 2017-06-12 DIAGNOSIS — Z111 Encounter for screening for respiratory tuberculosis: Secondary | ICD-10-CM

## 2017-06-12 LAB — POC HEMOCCULT BLD/STL (OFFICE/1-CARD/DIAGNOSTIC): Fecal Occult Blood, POC: NEGATIVE

## 2017-06-12 NOTE — Progress Notes (Signed)
   Kathleen Gallegos     MRN: [id number]      DOB: [birth year]   HPI Kathleen Gallegos is here for follow up and re-evaluation of chronic medical conditions, medication management and review of any available recent lab and radiology data.  Preventive health is updated, specifically  Cancer screening and Immunization.   Questions or concerns regarding consultations or procedures which the PT has had in the interim are  addressed. The PT denies any adverse reactions to current medications since the last visit.  Daughter concerned that her  Kathleen Gallegos is fussing a lot , and that she is truly getting burned out and feeling overwhelmed looking after [name] , as she feels her sisters are not working with her , states now they have involved a Chief Executive Officer to [name] , [name] , [name]  , reports a good appetite, denies any falls, [name]  ROS Denies recent fever or chills. Denies sinus pressure, nasal congestion, ear pain or sore throat. Denies chest congestion, productive cough or wheezing. Denies chest pains, palpitations and leg swelling Denies abdominal pain, nausea, vomiting,diarrhea or constipation.   Denies dysuria, frequency, hesitancy or incontinence. Denies joint pain, swelling and limitation in mobility. Denies headaches, seizures, numbness, or tingling. Denies depression, anxiety or insomnia. Denies skin break down or rash. Needs PPD placed    PE  BP 112/68   Pulse 80   Resp 16   Ht 5\' 6"  (1.676 m)   Wt 131 lb (59.4 kg)   SpO2 95%   BMI 21.14 kg/m   Patient alert  and in no cardiopulmonary distress.  HEENT: No facial asymmetry, EOMI,   oropharynx pink and moist.  Neck decreased ROM no JVD, no mass.  Chest: Clear to auscultation bilaterally.  CVS: S1, S2 no murmurs, no S3.Regular rate.  ABD: Soft non tender. No palpable  organomegaly or mass, normal BS Rectal: no mass , heme negative stool  Ext: No edema  MS: Adequate though adequate  ROM spine, shoulders, hips and knees.  Skin: Intact, no ulcerations or rash noted.  Psych: Good eye contact, normal affect. Memory impaired t not anxious or depressed appearing.  CNS: CN 2-12 intact, power,  normal throughout.no focal deficits noted.   Assessment & Plan  Dementia Continue aricept as before Best situation will be when pt is in the Adult day care setting away from her her home 24/7, hopefully this will be the case in the near future  Hyperlipemia Hyperlipidemia:Low fat diet discussed and encouraged.   Lipid Panel  Lab Results  Component Value Date   CHOL 199 10/09/2016   HDL 87 10/09/2016   LDLCALC 103 (H) 10/09/2016   TRIG 43 10/09/2016   CHOLHDL 2.3 10/09/2016    Continue low fat diet   Allergic rhinitis due to pollen Controlled, no change in medication   GERD Controlled, no change in medication   Encounter for PPD test PPD test placed , and is to be read in next 48 hours

## 2017-06-12 NOTE — Patient Instructions (Addendum)
F/u in  5 months , call if you need me sooner   Return this Friday for PPD test to be read  Flu vaccine today   I STRONGLY recommend that you go to the [facility] in the days, you will absolutely enjoy and benefit from this   Rectal exam is normal, no hidden blood in stool  No changes in medication  Careful not to fall   Fall Prevention in the Home Falls can cause injuries. They can happen to people of all ages. There are many things you can do to make your home safe and to help prevent falls. What can I do on the outside of my home?  Regularly fix the edges of walkways and driveways and fix any cracks.  Remove anything that might make you trip as you walk through a door, such as a raised step or threshold.  Trim any bushes or trees on the path to your home.  Use bright outdoor lighting.  Clear any walking paths of anything that might make someone trip, such as rocks or tools.  Regularly check to see if handrails are loose or broken. Make sure that both sides of any steps have handrails.  Any raised decks and porches should have guardrails on the edges.  Have any leaves, snow, or ice cleared regularly.  Use sand or salt on walking paths during winter.  Clean up any spills in your garage right away. This includes oil or grease spills. What can I do in the bathroom?  Use night lights.  Install grab bars by the toilet and in the tub and shower. Do not use towel bars as grab bars.  Use non-skid mats or decals in the tub or shower.  If you need to sit down in the shower, use a plastic, non-slip stool.  Keep the floor dry. Clean up any water that spills on the floor as soon as it happens.  Remove soap buildup in the tub or shower regularly.  Attach bath mats securely with double-sided non-slip rug tape.  Do not have throw rugs and other things on the floor that can make you trip. What can I do in the bedroom?  Use night lights.  Make sure that you have a  light by your bed that is easy to reach.  Do not use any sheets or blankets that are too big for your bed. They should not hang down onto the floor.  Have a firm chair that has side arms. You can use this for support while you get dressed.  Do not have throw rugs and other things on the floor that can make you trip. What can I do in the kitchen?  Clean up any spills right away.  Avoid walking on wet floors.  Keep items that you use a lot in easy-to-reach places.  If you need to reach something above you, use a strong step stool that has a grab bar.  Keep electrical cords out of the way.  Do not use floor polish or wax that makes floors slippery. If you must use wax, use non-skid floor wax.  Do not have throw rugs and other things on the floor that can make you trip. What can I do with my stairs?  Do not leave any items on the stairs.  Make sure that there are handrails on both sides of the stairs and use them. Fix handrails that are broken or loose. Make sure that handrails are as long as the stairways.  Check any carpeting to make sure that it is firmly attached to the stairs. Fix any carpet that is loose or worn.  Avoid having throw rugs at the top or bottom of the stairs. If you do have throw rugs, attach them to the floor with carpet tape.  Make sure that you have a light switch at the top of the stairs and the bottom of the stairs. If you do not have them, ask someone to add them for you. What else can I do to help prevent falls?  Wear shoes that: ? Do not have high heels. ? Have rubber bottoms. ? Are comfortable and fit you well. ? Are closed at the toe. Do not wear sandals.  If you use a stepladder: ? Make sure that it is fully opened. Do not climb a closed stepladder. ? Make sure that both sides of the stepladder are locked into place. ? Ask someone to hold it for you, if possible.  Clearly mark and make sure that you can see: ? Any grab bars or  handrails. ? First and last steps. ? Where the edge of each step is.  Use tools that help you move around (mobility aids) if they are needed. These include: ? Canes. ? Walkers. ? Scooters. ? Crutches.  Turn on the lights when you go into a dark area. Replace any light bulbs as soon as they burn out.  Set up your furniture so you have a clear path. Avoid moving your furniture around.  If any of your floors are uneven, fix them.  If there are any pets around you, be aware of where they are.  Review your medicines with your doctor. Some medicines can make you feel dizzy. This can increase your chance of falling. Ask your doctor what other things that you can do to help prevent falls. This information is not intended to replace advice given to you by your health care provider. Make sure you discuss any questions you have with your health care provider. Document Released: 04/21/2009 Document Revised: 12/01/2015 Document Reviewed: 07/30/2014 Elsevier Interactive Patient Education  [employer].

## 2017-06-14 ENCOUNTER — Encounter: Payer: Self-pay | Admitting: Family Medicine

## 2017-06-14 LAB — TB SKIN TEST
Induration: 0 mm
TB SKIN TEST: NEGATIVE

## 2017-06-16 ENCOUNTER — Encounter: Payer: Self-pay | Admitting: Family Medicine

## 2017-06-16 NOTE — Assessment & Plan Note (Signed)
Continue aricept as before Best situation will be when pt is in the Adult day care setting away from her her home 24/7, hopefully this will be the case in the near future

## 2017-06-16 NOTE — Assessment & Plan Note (Signed)
Hyperlipidemia:Low fat diet discussed and encouraged.   Lipid Panel  Lab Results  Component Value Date   CHOL 199 10/09/2016   HDL 87 10/09/2016   LDLCALC 103 (H) 10/09/2016   TRIG 43 10/09/2016   CHOLHDL 2.3 10/09/2016    Continue low fat diet

## 2017-06-16 NOTE — Assessment & Plan Note (Signed)
Controlled, no change in medication  

## 2017-06-16 NOTE — Assessment & Plan Note (Signed)
PPD test placed , and is to be read in next 48 hours

## 2017-10-01 ENCOUNTER — Other Ambulatory Visit: Payer: Self-pay | Admitting: Family Medicine

## 2017-10-24 ENCOUNTER — Telehealth: Payer: Self-pay | Admitting: Family Medicine

## 2017-10-24 NOTE — Telephone Encounter (Signed)
Patient want referral to a foot doctor regarding her toe nails. She is not able to cut them herself anymore.

## 2017-10-28 ENCOUNTER — Other Ambulatory Visit: Payer: Self-pay | Admitting: Family Medicine

## 2017-10-28 ENCOUNTER — Telehealth: Payer: Self-pay | Admitting: Family Medicine

## 2017-10-28 DIAGNOSIS — L602 Onychogryphosis: Secondary | ICD-10-CM

## 2017-10-28 NOTE — Telephone Encounter (Signed)
[facility] called and said Kathleen Gallegos's daughter called and wanted appt to cut toe nails.  She does not need a referral. Pt has medicare.

## 2017-10-28 NOTE — Telephone Encounter (Signed)
pls see referral for podiatry and arrange appointment , you may speak with her daughter Mayvis Agudelo, thanks

## 2017-11-13 ENCOUNTER — Ambulatory Visit: Payer: Self-pay | Admitting: Family Medicine

## 2017-11-20 ENCOUNTER — Ambulatory Visit: Payer: Medicare Other | Admitting: Podiatry

## 2017-12-18 ENCOUNTER — Ambulatory Visit: Payer: Medicare Other | Admitting: Podiatry

## 2017-12-22 ENCOUNTER — Other Ambulatory Visit: Payer: Self-pay | Admitting: Family Medicine

## 2017-12-22 DIAGNOSIS — R002 Palpitations: Secondary | ICD-10-CM

## 2018-01-01 ENCOUNTER — Ambulatory Visit (INDEPENDENT_AMBULATORY_CARE_PROVIDER_SITE_OTHER): Payer: Medicare Other | Admitting: Family Medicine

## 2018-01-01 ENCOUNTER — Encounter: Payer: Self-pay | Admitting: Family Medicine

## 2018-01-01 VITALS — BP 122/66 | HR 75 | Resp 16 | Ht 66.0 in | Wt 136.0 lb

## 2018-01-01 DIAGNOSIS — J301 Allergic rhinitis due to pollen: Secondary | ICD-10-CM | POA: Diagnosis not present

## 2018-01-01 DIAGNOSIS — R002 Palpitations: Secondary | ICD-10-CM | POA: Diagnosis not present

## 2018-01-01 DIAGNOSIS — Z719 Counseling, unspecified: Secondary | ICD-10-CM | POA: Diagnosis not present

## 2018-01-01 DIAGNOSIS — M150 Primary generalized (osteo)arthritis: Secondary | ICD-10-CM | POA: Diagnosis not present

## 2018-01-01 DIAGNOSIS — M159 Polyosteoarthritis, unspecified: Secondary | ICD-10-CM

## 2018-01-01 DIAGNOSIS — F0391 Unspecified dementia with behavioral disturbance: Secondary | ICD-10-CM | POA: Diagnosis not present

## 2018-01-01 MED ORDER — METOPROLOL SUCCINATE ER 25 MG PO TB24: ORAL_TABLET | ORAL | 5 refills | Status: DC

## 2018-01-01 NOTE — Patient Instructions (Addendum)
F/U in 4.5 months, call if you need me before  Wellness wit nurse is past due please schdule   Please get CBC, fasting lipid, cmp and EGFR , TSH, Vit D as soon as possible  Please  Be careful not to fall
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
## 2018-01-05 ENCOUNTER — Encounter: Payer: Self-pay | Admitting: Family Medicine

## 2018-01-05 NOTE — Assessment & Plan Note (Signed)
Minimal, denies significance t  Joint stiffness and she has hd no falls, home [facility] is impportant

## 2018-01-05 NOTE — Assessment & Plan Note (Addendum)
Elderly patient with dementia and behavioral disturbance , in home care a reported challenge and her 3 daughters present in the office as this is openly discussed. All 3 seem to feel that it is best for their Mother o live in her own home with sitter service , the oldest daughter [name] who has been caring for her and living with her has also made it clear that she is no longer able to live with her Mother for her own health reasons, they have left the visit with the clear understandings that together as a family unit they will ranier  Safe living situation for their Mother Plan is that she start at the adult day care center in the near future also
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
## 2018-01-05 NOTE — Assessment & Plan Note (Signed)
Controlled, no change in medication  

## 2018-01-05 NOTE — Addendum Note (Signed)
Addended by: Fayrene Helper on: 01/05/2018 03:48 PM   Modules accepted: Level of Service

## 2018-01-13 ENCOUNTER — Ambulatory Visit: Payer: Medicare Other | Admitting: Podiatry

## 2018-01-28 ENCOUNTER — Telehealth: Payer: Self-pay | Admitting: Family Medicine

## 2018-01-28 NOTE — Telephone Encounter (Signed)
Patients daughter is requesting to speak with Dr.Simpson to discuss patients last office visit. Cb#: [phone]

## 2018-02-06 ENCOUNTER — Ambulatory Visit (INDEPENDENT_AMBULATORY_CARE_PROVIDER_SITE_OTHER): Payer: Medicare Other | Admitting: Podiatry

## 2018-02-06 DIAGNOSIS — B351 Tinea unguium: Secondary | ICD-10-CM | POA: Diagnosis not present

## 2018-02-06 DIAGNOSIS — M79609 Pain in unspecified limb: Secondary | ICD-10-CM | POA: Diagnosis not present

## 2018-02-06 NOTE — Progress Notes (Signed)
  Subjective:  Patient ID: Kathleen Gallegos, female    DOB: 24-Apr-1926,  MRN: [id number]  Chief Complaint  Patient presents with  . Nail Problem    bilateral very thick, elongated discolored toenails, left worse than right. Patient is unable to trim herself   84 y.o. female returns for the above complaint. Reports thickened toenails that cause her pain when she walks per her daughter.  Objective:  There were no vitals filed for this visit. General AA&O x3. Pleasantly confused.  Vascular Pedal pulses palpable.  Neurologic Epicritic sensation grossly intact.  Dermatologic No open lesions. Skin normal texture and turgor. Toenails x 10 elongated, thickened, dystrophic.  Orthopedic: Pain to palpation about the toenails.   Assessment & Plan:  Patient was evaluated and treated and all questions answered.  Onychomycosis with pain  -Nails palliatively debrided as below. -Educated on self-care  Procedure: Nail Debridement Rationale: pain  Type of Debridement: manual, sharp debridement. Instrumentation: Nail nipper, rotary burr. Number of Nails: 10  Return if symptoms worsen or fail to improve.

## 2018-02-25 DIAGNOSIS — Z961 Presence of intraocular lens: Secondary | ICD-10-CM | POA: Diagnosis not present

## 2018-02-25 DIAGNOSIS — H04123 Dry eye syndrome of bilateral lacrimal glands: Secondary | ICD-10-CM | POA: Diagnosis not present

## 2018-03-21 ENCOUNTER — Telehealth: Payer: Self-pay | Admitting: Family Medicine

## 2018-03-21 NOTE — Telephone Encounter (Signed)
Is there a certain one you prefer for her to take? Or something like flonase?

## 2018-03-21 NOTE — Telephone Encounter (Signed)
Kathleen Gallegos calling to ask what over the counter allergy medication would best help patients constant runny nose. Cb#: [phone]/ [phone]

## 2018-03-21 NOTE — Telephone Encounter (Signed)
I recommend generic loratidine

## 2018-03-26 NOTE — Telephone Encounter (Signed)
lvm

## 2018-03-26 NOTE — Telephone Encounter (Signed)
Can you please tell her generic claritin

## 2018-04-07 ENCOUNTER — Telehealth: Payer: Self-pay | Admitting: Family Medicine

## 2018-04-07 ENCOUNTER — Other Ambulatory Visit: Payer: Self-pay | Admitting: Family Medicine

## 2018-04-07 DIAGNOSIS — Z780 Asymptomatic menopausal state: Secondary | ICD-10-CM

## 2018-04-07 NOTE — Progress Notes (Signed)
REFERRED FOR DEXA PRE REQUEST OF DAUGHTER

## 2018-04-07 NOTE — Telephone Encounter (Signed)
Can patient have an order for the Dexa scan?

## 2018-04-07 NOTE — Telephone Encounter (Signed)
REFERRAL ENTERED PLS SCHEDULE

## 2018-04-07 NOTE — Telephone Encounter (Signed)
[name] called, to advise that Mom (pt) fell a couple of weeks ago, and is thinking that the pt needs a bone scan completed. She is calling the insurance also. Can you put in a order for the Scan at [facility]

## 2018-04-08 NOTE — Telephone Encounter (Signed)
Called and spoke with [name], she will schedule appointment

## 2018-04-24 ENCOUNTER — Ambulatory Visit (HOSPITAL_COMMUNITY)
Admission: RE | Admit: 2018-04-24 | Discharge: 2018-04-24 | Disposition: A | Discharge status: Home / Self Care | Payer: Medicare Other | Source: Ambulatory Visit | Attending: Family Medicine | Admitting: Family Medicine

## 2018-04-24 DIAGNOSIS — M8589 Other specified disorders of bone density and structure, multiple sites: Secondary | ICD-10-CM | POA: Diagnosis not present

## 2018-04-24 DIAGNOSIS — M85852 Other specified disorders of bone density and structure, left thigh: Secondary | ICD-10-CM | POA: Diagnosis not present

## 2018-04-24 DIAGNOSIS — Z780 Asymptomatic menopausal state: Secondary | ICD-10-CM | POA: Diagnosis not present

## 2018-04-24 DIAGNOSIS — M810 Age-related osteoporosis without current pathological fracture: Secondary | ICD-10-CM | POA: Diagnosis not present

## 2018-04-25 ENCOUNTER — Encounter: Payer: Self-pay | Admitting: Family Medicine

## 2018-04-30 ENCOUNTER — Encounter: Payer: Self-pay | Admitting: Family Medicine

## 2018-04-30 ENCOUNTER — Ambulatory Visit (INDEPENDENT_AMBULATORY_CARE_PROVIDER_SITE_OTHER): Payer: Medicare Other | Admitting: Family Medicine

## 2018-04-30 VITALS — BP 136/66 | HR 68 | Resp 12 | Ht 66.0 in | Wt 136.0 lb

## 2018-04-30 DIAGNOSIS — E7849 Other hyperlipidemia: Secondary | ICD-10-CM

## 2018-04-30 DIAGNOSIS — F0391 Unspecified dementia with behavioral disturbance: Secondary | ICD-10-CM | POA: Diagnosis not present

## 2018-04-30 DIAGNOSIS — J301 Allergic rhinitis due to pollen: Secondary | ICD-10-CM | POA: Diagnosis not present

## 2018-04-30 DIAGNOSIS — Z23 Encounter for immunization: Secondary | ICD-10-CM | POA: Diagnosis not present

## 2018-04-30 DIAGNOSIS — R002 Palpitations: Secondary | ICD-10-CM | POA: Diagnosis not present

## 2018-04-30 DIAGNOSIS — M810 Age-related osteoporosis without current pathological fracture: Secondary | ICD-10-CM

## 2018-04-30 DIAGNOSIS — D61818 Other pancytopenia: Secondary | ICD-10-CM

## 2018-04-30 NOTE — Patient Instructions (Addendum)
F/U in 5 months, call if you need me before  Wellness with nurse past due,schedule if able  Labs today CBC, cmp and EGFr , TSH  Flu vaccine today  Careful not to fall

## 2018-04-30 NOTE — Progress Notes (Signed)
   MATILDA FLEIG     MRN: [id number]      DOB: 16-Mar-1926   HPI Ms. Bogus is here for follow up and re-evaluation of chronic medical conditions, medication management and review of any available recent lab and radiology data.  Daughter states she is overwhelmed with care of her Mother who is being more combative, and she is still working this out with her 2 sisters. Increased difficulty wih grooming and dressing Appetite and bowel movements are good No falls Pt states she feels well and has no complaints   ROS Patient states she has no complaints, denies pain , she does however have severe dementia, so her history is unreliable Denies recent fever or chills. Denies sinus pressure, nasal congestion, ear pain or sore throat. Denies chest congestion, productive cough or wheezing. Denies chest pains, palpitations and leg swelling Denies abdominal pain, nausea, vomiting,diarrhea or constipation.   Denies dysuria, frequency, hesitancy or incontinence. Denies joint pain, swelling and does report some  limitation in mobility. Denies headaches, seizures, numbness, or tingling. Denies depression, anxiety or insomnia. Denies skin break down or rash.c/o chronic dry skin   PE  BP 136/66 (BP Location: Left Arm, Patient Position: Sitting, Cuff Size: Normal)   Pulse 68   Resp 12   Ht 5\' 6"  (1.676 m)   Wt 136 lb (61.7 kg)   SpO2 97% Comment: room air  BMI 21.95 kg/m   Patient alert  and in no cardiopulmonary distress.  HEENT: No facial asymmetry, EOMI,   oropharynx pink and moist.  Neck decreased ROM no JVD, no mass.  Chest: Clear to auscultation bilaterally.  CVS: S1, S2 no murmurs, no S3.Regular rate.  ABD: Soft non tender.   Ext: No edema  MS: Decreased  ROM spine, shoulders, hips and knees.  Skin: Intact, no ulcerations or rash noted.Dry skin noted on feet Psych: Good eye contact, normal affect. Memory impaired not anxious or depressed appearing.  CNS: CN 2-12 intact,  power,  normal throughout.no focal deficits noted.   Assessment & Plan  Dementia No combative behavior reported at visit, however , an increasing challenge for care , and the one daughter [name] who lives with her   Is incapable of continuing this indefinitely without additional help. She and her siblings are workings on this  Allergic rhinitis due to pollen Minimal symptoms , uses OTC medication as needed only, no current or recent flare  Hyperlipemia Hyperlipidemia:Low fat diet discussed and encouraged.   Lipid Panel  Lab Results  Component Value Date   CHOL 199 10/09/2016   HDL 87 10/09/2016   LDLCALC 103 (H) 10/09/2016   TRIG 43 10/09/2016   CHOLHDL 2.3 10/09/2016   Updated lab needed , managed with diet only     PALPITATIONS Controlled, no change in medication   Osteoporosis Intolerant of bisphosphonate orally, and no interest in parenteral treatment as she had experienced jaw pain when she had tried oral agent in the past

## 2018-05-01 LAB — COMPLETE METABOLIC PANEL WITH GFR
AG Ratio: 1.2 (calc) (ref 1.0–2.5)
ALKALINE PHOSPHATASE (APISO): 39 U/L (ref 33–130)
ALT: 12 U/L (ref 6–29)
AST: 20 U/L (ref 10–35)
Albumin: 4 g/dL (ref 3.6–5.1)
BUN/Creatinine Ratio: 18 (calc) (ref 6–22)
BUN: 17 mg/dL (ref 7–25)
CO2: 29 mmol/L (ref 20–32)
CREATININE: 0.92 mg/dL — AB (ref 0.60–0.88)
Calcium: 9.2 mg/dL (ref 8.6–10.4)
Chloride: 105 mmol/L (ref 98–110)
GFR, Est African American: 64 mL/min/{1.73_m2} (ref >=60)
GFR, Est Non African American: 55 mL/min/{1.73_m2} — ABNORMAL LOW (ref >=60)
Globulin: 3.4 g/dL (calc) (ref 1.9–3.7)
Glucose, Bld: 80 mg/dL (ref 65–139)
Potassium: 4.1 mmol/L (ref 3.5–5.3)
SODIUM: 140 mmol/L (ref 135–146)
Total Bilirubin: 0.5 mg/dL (ref 0.2–1.2)
Total Protein: 7.4 g/dL (ref 6.1–8.1)

## 2018-05-01 LAB — CBC
HEMATOCRIT: 35.9 % (ref 35.0–45.0)
Hemoglobin: 12.4 g/dL (ref 11.7–15.5)
MCH: 33.2 pg — ABNORMAL HIGH (ref 27.0–33.0)
MCHC: 34.5 g/dL (ref 32.0–36.0)
MCV: 96 fL (ref 80.0–100.0)
MPV: 11.5 fL (ref 7.5–12.5)
PLATELETS: 149 10*3/uL (ref 140–400)
RBC: 3.74 10*6/uL — ABNORMAL LOW (ref 3.80–5.10)
RDW: 11.9 % (ref 11.0–15.0)
WBC: 4.6 10*3/uL (ref 3.8–10.8)

## 2018-05-01 LAB — TSH: TSH: 2.11 m[IU]/L (ref 0.40–4.50)

## 2018-05-11 ENCOUNTER — Encounter: Payer: Self-pay | Admitting: Family Medicine

## 2018-05-11 NOTE — Assessment & Plan Note (Signed)
Minimal symptoms , uses OTC medication as needed only, no current or recent flare

## 2018-05-11 NOTE — Assessment & Plan Note (Signed)
Controlled, no change in medication  

## 2018-05-11 NOTE — Assessment & Plan Note (Signed)
Hyperlipidemia:Low fat diet discussed and encouraged.   Lipid Panel  Lab Results  Component Value Date   CHOL 199 10/09/2016   HDL 87 10/09/2016   LDLCALC 103 (H) 10/09/2016   TRIG 43 10/09/2016   CHOLHDL 2.3 10/09/2016   Updated lab needed , managed with diet only

## 2018-05-11 NOTE — Assessment & Plan Note (Signed)
Intolerant of bisphosphonate orally, and no interest in parenteral treatment as she had experienced jaw pain when she had tried oral agent in the past

## 2018-05-11 NOTE — Assessment & Plan Note (Signed)
No combative behavior reported at visit, however , an increasing challenge for care , and the one daughter [name] who lives with her   Is incapable of continuing this indefinitely without additional help. She and her siblings are workings on this

## 2018-06-24 ENCOUNTER — Encounter: Payer: Self-pay | Admitting: *Deleted

## 2018-08-01 ENCOUNTER — Encounter: Payer: Self-pay | Admitting: Family Medicine

## 2018-08-18 ENCOUNTER — Ambulatory Visit: Payer: Self-pay

## 2018-09-15 ENCOUNTER — Other Ambulatory Visit: Payer: Self-pay | Admitting: Family Medicine

## 2018-09-15 ENCOUNTER — Telehealth: Payer: Self-pay | Admitting: *Deleted

## 2018-09-15 DIAGNOSIS — F0391 Unspecified dementia with behavioral disturbance: Secondary | ICD-10-CM

## 2018-09-15 NOTE — Telephone Encounter (Signed)
Daughter is in agreement with treatment plan. Please refer back to the office you referred back to in the beginning. She stated it is across from [facility] on [address] and has several doctors in the practice.

## 2018-09-15 NOTE — Telephone Encounter (Signed)
Please advise 

## 2018-09-15 NOTE — Progress Notes (Signed)
amb geriatr

## 2018-09-15 NOTE — Telephone Encounter (Signed)
Pls let her know that I recommend she have her Mom evaluated by Geriatric Specialist in light of entire living situation and family dynamics and am happy to refer her back if she agrees

## 2018-09-15 NOTE — Telephone Encounter (Signed)
Pts daughter [name] (She is on [place]) called concerning Quaneisha. She stated she had talked to Dr. Moshe Cipro a few years ago about Geriatric Drs and at the time she wasn't that interested as she was waiting for family to get on board and help her. She now realizes that she is doing this all alone and it is affected her mentally physically and emotionally. She wanted to talk to Dr. Moshe Cipro as Vianca's dementia is worse and she has stopped dressing herself and is more combative. She wanted to see if Dr. Moshe Cipro would refer her back out to the geriatric Drs. Or point her in the right direction as she feels her mom is not living a good life at home. She stated she had an appt in April for herself and her mom had one April 8. I offered to move the appt up but she stated that it is very hard to bring her mother out and just wanted to see what Dr. Moshe Cipro recommended first.

## 2018-09-15 NOTE — Telephone Encounter (Signed)
Called to discuss Dr.Simpson's recommendation. No answer. Left generic message requesting call back.

## 2018-10-01 ENCOUNTER — Ambulatory Visit: Payer: Self-pay | Admitting: Family Medicine

## 2018-10-13 ENCOUNTER — Telehealth: Payer: Self-pay | Admitting: Family Medicine
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
## 2018-10-15 ENCOUNTER — Ambulatory Visit (INDEPENDENT_AMBULATORY_CARE_PROVIDER_SITE_OTHER): Payer: Medicare Other | Admitting: Family Medicine

## 2018-10-15 ENCOUNTER — Encounter: Payer: Self-pay | Admitting: Family Medicine

## 2018-10-15 ENCOUNTER — Other Ambulatory Visit: Payer: Self-pay

## 2018-10-15 VITALS — BP 130/70 | Ht 66.0 in | Wt 136.0 lb

## 2018-10-15 DIAGNOSIS — R002 Palpitations: Secondary | ICD-10-CM | POA: Diagnosis not present

## 2018-10-15 DIAGNOSIS — Z0000 Encounter for general adult medical examination without abnormal findings: Secondary | ICD-10-CM

## 2018-10-15 DIAGNOSIS — F0391 Unspecified dementia with behavioral disturbance: Secondary | ICD-10-CM | POA: Diagnosis not present

## 2018-10-15 MED ORDER — CALCIUM CARBONATE-VITAMIN D 500-200 MG-UNIT PO TABS: 1.0000 | ORAL_TABLET | Freq: Every day | ORAL | 0 refills | Status: DC

## 2018-10-15 MED ORDER — METOPROLOL SUCCINATE ER 25 MG PO TB24: ORAL_TABLET | ORAL | 1 refills | Status: DC

## 2018-10-15 MED ORDER — DONEPEZIL HCL 10 MG PO TABS: 10.0000 mg | ORAL_TABLET | Freq: Every day | ORAL | 1 refills | Status: DC
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
## 2018-10-15 NOTE — Patient Instructions (Signed)
No furthers appointments  With our office as we discussed.  We will miss you and wish you all the very best, your move is in your best interest   Front desk staff will contact your daughter [name] with the telephone number to call for your new patient appointment with you new Primary Care doc, the Geriatric Specialist in [place]  Thanks for choosing [facility], we consider it a privelige to serve you.

## 2018-10-15 NOTE — Telephone Encounter (Signed)
noted 

## 2018-10-15 NOTE — Patient Instructions (Addendum)
Although I wil miss being your PCP, it is in your best interest that you have your care provided by the Geriatric Specialist in [place]. Your daughter [name] will call and confirm a new patient appointment with your Geriatric Doctor   It has been a real pleasure and privilege caring for you over the years  I will not order any labs today, and I do recommend that you take Calcium with Vitamin D once daily (1200 mg calcium and vit D 1000 IU once daily)  I will leave any labs to be ordered to her new Primary Care Doctor.  Continue to be careful not to fall, eat regularly, and I believe that the quality of your life will improve with your new arrangements  Thanks for choosing [facility], we consider it a privelige to serve you.   Social distancing. Cover your face with a mask whenever you do go out Frequent hand washing with soap and water Keeping your hands off of your face. These 4 practices will help to keep both you and your community healthy during this time. Please practice them faithfully!
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
## 2018-10-18 ENCOUNTER — Encounter: Payer: Self-pay | Admitting: Family Medicine

## 2018-10-18 NOTE — Assessment & Plan Note (Signed)
Controlled, no change in medication  

## 2018-10-18 NOTE — Assessment & Plan Note (Signed)
Progressively worsening symptoms, as far as poor personal hygiene , and verbally combative behavior per report of her daughter Kathleen Gallegos who lives with her. Geriatric Specialist to assume care

## 2018-10-18 NOTE — Assessment & Plan Note (Addendum)
Annual wellness visit  as documented. Transfer of care to Geriatric Specialist extremely appropriate.

## 2018-10-20 ENCOUNTER — Telehealth: Payer: Self-pay | Admitting: Family Medicine

## 2018-10-20 NOTE — Telephone Encounter (Signed)
Please call Jaala Bohle regarding her mother in assisted living. She knows you will be be calling back till 10/27/18.  [phone]

## 2018-10-21 NOTE — Telephone Encounter (Signed)
At last visit she was going to establish care with Geriatric Medicine, she needs to do this and yes, establishing with a Nurse Practioner is FINE, please proceed

## 2018-10-23 ENCOUNTER — Telehealth: Payer: Self-pay | Admitting: Family Medicine

## 2018-10-23 NOTE — Telephone Encounter (Signed)
[name] called and wanted to make sure you have the [employer].

## 2018-10-23 NOTE — Telephone Encounter (Signed)
Filled out pt registration for [facility] and faxed to [place] to [place].  They will call her Friday am for New Pt appt to [facility] care.

## 2018-10-23 NOTE — Telephone Encounter (Signed)
[name] called again.  I am going to carry the paperwork [place] needs to her and will pick it up from her home tomorrow & fax to [place] at [facility].

## 2018-10-31 ENCOUNTER — Encounter: Payer: Self-pay | Admitting: Family

## 2018-10-31 ENCOUNTER — Other Ambulatory Visit: Payer: Self-pay

## 2018-10-31 ENCOUNTER — Ambulatory Visit (INDEPENDENT_AMBULATORY_CARE_PROVIDER_SITE_OTHER): Payer: Medicare Other | Admitting: Family

## 2018-10-31 DIAGNOSIS — M810 Age-related osteoporosis without current pathological fracture: Secondary | ICD-10-CM | POA: Diagnosis not present

## 2018-10-31 DIAGNOSIS — F0391 Unspecified dementia with behavioral disturbance: Secondary | ICD-10-CM

## 2018-10-31 DIAGNOSIS — M150 Primary generalized (osteo)arthritis: Secondary | ICD-10-CM

## 2018-10-31 DIAGNOSIS — K589 Irritable bowel syndrome without diarrhea: Secondary | ICD-10-CM

## 2018-10-31 DIAGNOSIS — J301 Allergic rhinitis due to pollen: Secondary | ICD-10-CM | POA: Diagnosis not present

## 2018-10-31 DIAGNOSIS — M159 Polyosteoarthritis, unspecified: Secondary | ICD-10-CM

## 2018-10-31 MED ORDER — QUETIAPINE FUMARATE 25 MG PO TABS: 12.5000 mg | ORAL_TABLET | Freq: Every day | ORAL | 0 refills | Status: DC

## 2018-10-31 NOTE — Progress Notes (Signed)
This service is provided via telemedicine  No vital signs collected/recorded due to the encounter was a telemedicine visit.   Location of patient (ex: home, work):  No  Patient consents to a telephone visit:  Yes  Location of the provider (ex: office, home): Office    Names of all persons participating in the telemedicine service and their role in the encounter:  Ruthell Rummage CMA, [name] Ngetich NP, Cherylann Ratel and daughter [name]   Time spent on call:  Ruthell Rummage spent 7   Minutes with patient on phone   Provider: [name] Ngetich FNP-C   Fayrene Helper, MD  Patient Care Team: Fayrene Helper, MD as PCP - General (Family Medicine) Whitney Muse, Kelby Fam, MD (Inactive) as Consulting Physician (Hematology and Oncology) Rutherford Guys, MD as Consulting Physician (Ophthalmology)  Extended Emergency Contact Information Primary Emergency Contact: Mikey College Address: [address]          [place],  [address] [place] Phone: [phone] Mobile Phone: [phone] Relation: Daughter   Goals of care: Advanced Directive information Advanced Directives 11/20/2016  Does Patient Have a Medical Advance Directive? Yes  Type of Advance Directive [place] in Chart? No - copy requested  Would patient like information on creating a medical advance directive? -     Chief Complaint  Patient presents with  . Establish Care    New Patient, patient has dementia and is combative at times. Daughter currently takes care of patient.     HPI:  Pt is a 85 y.o. female seen today to establish care at  [facility] practice for medical management of chronic diseases.she previously followed up by her PCP Dr.Margaret Moshe Cipro.Patient's daughter [name] provides additional HPI information due to patient cognitive impairment. Patient's daughter states patient has become difficulty to care.she has been  combative at times and argument ive.she refuses to change her clothes and bathing.Sometimes states she sees a man at the door and ask at times " when are we going home" despite being reminded that she is home.she misplaces her stuff and daughter sometimes finds it hidden or tied up in plastic bags.Daughter states patient yells at her at times telling her to get out of her house.she wakes up at night and goes by the door setting the alarm on but has not wandered away.Her appetite is described as fair.No fall episodes.Daughter is trying to work with her 2 sister to place patient in facility where she can have trained personal to assist with her care. Patient states doing fine does not have any pain any where or any other acute issues.Medication reviewed with patient but states not taking any medication though states does not like to take a lot of medication.     Past Medical History:  Diagnosis Date  . Allergic rhinitis, cause unspecified   . Angioneurotic edema not elsewhere classified   . Anxiety disorder   . Asthma   . Carotid bruit   . Carpal tunnel syndrome   . Chronic abdominal pain   . Fecal incontinence   . GERD (gastroesophageal reflux disease)   . Hyperlipidemia   . IBS (irritable bowel syndrome)   . Insomnia   . Lung nodule   . Osteoarthritis   . Palpitations   . Pancytopenia ([place])   . Senile osteoporosis    Past Surgical History:  Procedure Laterality Date  . ABDOMINAL HYSTERECTOMY  1979   fibroids   . bilateral cataract  surgery  2008  . BIOPSY THYROID  1970   . COLONOSCOPY  12/21/04   normal -redundant   . EUS  08/07/07  . EYE SURGERY      Allergies  Allergen Reactions  . Diphenhydramine Hcl Other (See Comments)    unknown  . Rivastigmine Tartrate Palpitations    Allergies as of 10/31/2018      Reactions   Diphenhydramine Hcl Other (See Comments)   unknown   Rivastigmine Tartrate Palpitations      Medication List       Accurate as of October 31, 2018 12:25  PM. Always use your most recent med list.        calcium-vitamin D 500-200 MG-UNIT tablet Commonly known as:  OSCAL WITH D Take 1 tablet by mouth daily with breakfast.   donepezil 10 MG tablet Commonly known as:  ARICEPT Take 1 tablet (10 mg total) by mouth at bedtime.   metoprolol succinate 25 MG 24 hr tablet Commonly known as:  TOPROL-XL TAKE 1 TABLET BY MOUTH ONCE DAILY **CHANGE  IN  DIRECTIONS**       Review of Systems  Unable to perform ROS: Dementia (additional information provided by patient's daughter [name] )  Constitutional: Negative for appetite change, chills and fever.  HENT: Negative for congestion, rhinorrhea, sinus pressure, sinus pain, sneezing, sore throat and trouble swallowing.   Eyes: Positive for visual disturbance. Negative for pain, discharge, redness and itching.       Wears eye glasses   Respiratory: Negative for cough, chest tightness, shortness of breath and wheezing.   Cardiovascular: Negative for chest pain, palpitations and leg swelling.  Gastrointestinal: Negative for abdominal distention, abdominal pain, constipation, diarrhea, nausea and vomiting.  Endocrine: Negative for cold intolerance, heat intolerance, polydipsia, polyphagia and polyuria.  Genitourinary: Negative for difficulty urinating, dysuria, flank pain and urgency.  Musculoskeletal: Negative for gait problem.  Skin: Negative for color change, pallor, rash and wound.  Neurological: Negative for dizziness, light-headedness and headaches.  Psychiatric/Behavioral: Negative for sleep disturbance.    Immunization History  Administered Date(s) Administered  . Influenza Split 03/27/2011, 04/09/2012  . Influenza Whole 03/31/2007, 03/29/2009, 04/12/2009, 03/14/2010  . Influenza, High Dose Seasonal PF 04/30/2018  . Influenza,inj,Quad PF,6+ Mos 03/31/2013, 04/07/2014, 03/08/2015, 02/23/2016, 06/06/2016, 06/12/2017  . PPD Test 06/12/2017  . Pneumococcal Conjugate-13 08/05/2014  .  Pneumococcal Polysaccharide-23 02/11/2006  . Td 04/18/2005   Pertinent  Health Maintenance Due  Topic Date Due  . INFLUENZA VACCINE  02/07/2019  . DEXA SCAN  Completed  . PNA vac Low Risk Adult  Completed   Fall Risk  10/15/2018 04/30/2018 10/19/2016 10/18/2016 02/23/2016  Falls in the past year? 0 Yes No No No  Number falls in past yr: 0 1 - - -  Injury with Fall? 0 No - - -  Risk for fall due to : - - - - -   There were no vitals filed for this visit. There is no height or weight on file to calculate BMI. Physical Exam Neurological:     Comments: She named the city and state where she lives but was unable to state date,day of the week,time or year.   Psychiatric:        Speech: Speech normal.   unable to complete physical examination over the phone.   Labs reviewed: Recent Labs    04/30/18 1627  NA 140  K 4.1  CL 105  CO2 29  GLUCOSE 80  BUN 17  CREATININE 0.92*  CALCIUM 9.2  Recent Labs    04/30/18 1627  AST 20  ALT 12  BILITOT 0.5  PROT 7.4   Recent Labs    04/30/18 1627  WBC 4.6  HGB 12.4  HCT 35.9  MCV 96.0  PLT 149   Lab Results  Component Value Date   TSH 2.11 04/30/2018   No results found for: HGBA1C Lab Results  Component Value Date   CHOL 199 10/09/2016   HDL 87 10/09/2016   LDLCALC 103 (H) 10/09/2016   TRIG 43 10/09/2016   CHOLHDL 2.3 10/09/2016    Significant Diagnostic Results in last 30 days:  No results found.  Assessment/Plan 1. Dementia with behavioral disturbance, unspecified dementia type ([place]) Has had increased resistive to care and does not change her clothes or takes her baths.Combative and verbally aggressive towards daughter at times.patient can benefit from higher level of care to assist with her activities of daily living and for safety to prevent wandering away.  - Seroquel 25 mg tablet take 12.5 mg tab by mouth daily at bedtime for agitation. - Patient's POA will notify provider once family agree on which facility to  place patient then provider will fill out FL 2 and sign for transition of care.  - will need a MMSE once COVID-19 restrictions are over.  - CBC with Differential/Platelets; Future - CMP with eGFR([facility]); Future  2. Primary osteoarthritis involving multiple joints Reports no pain. - CBC with Differential/Platelets; Future - CMP with eGFR([facility]); Future  3. Age-related osteoporosis without current pathological fracture Chart reviewed Dexa scan 04/24/2018 T-score -2.5  No recent fall or fractures reported.continue on calcium -vit D 500-200 mg- unit  one tablet daily   - CBC with Differential/Platelets; Future - CMP with eGFR([facility]); Future  4. Allergic rhinitis due to pollen, unspecified seasonality Symptoms under control takes OTC if needed.   5. Irritable bowel syndrome, unspecified type Reports no constipation, diarrhea or abdominal pain.currently not on any medication for IBS.  Family/ staff Communication: Reviewed plan of care with patient and patient's daughter [name].   Labs/tests ordered: CBC/diff and CMP 11/04/2018   [name], NP

## 2018-11-04 ENCOUNTER — Other Ambulatory Visit: Payer: Medicare Other

## 2018-11-07 ENCOUNTER — Other Ambulatory Visit: Payer: Self-pay

## 2018-11-07 ENCOUNTER — Other Ambulatory Visit: Payer: Medicare Other

## 2018-11-07 DIAGNOSIS — M150 Primary generalized (osteo)arthritis: Secondary | ICD-10-CM | POA: Diagnosis not present

## 2018-11-07 DIAGNOSIS — M810 Age-related osteoporosis without current pathological fracture: Secondary | ICD-10-CM | POA: Diagnosis not present

## 2018-11-07 DIAGNOSIS — F0391 Unspecified dementia with behavioral disturbance: Secondary | ICD-10-CM | POA: Diagnosis not present

## 2018-11-08 LAB — COMPLETE METABOLIC PANEL WITH GFR
AG Ratio: 1.2 (calc) (ref 1.0–2.5)
ALT: 21 U/L (ref 6–29)
AST: 30 U/L (ref 10–35)
Albumin: 4 g/dL (ref 3.6–5.1)
Alkaline phosphatase (APISO): 35 U/L — ABNORMAL LOW (ref 37–153)
BUN/Creatinine Ratio: 18 (calc) (ref 6–22)
BUN: 17 mg/dL (ref 7–25)
CO2: 32 mmol/L (ref 20–32)
Calcium: 9.1 mg/dL (ref 8.6–10.4)
Chloride: 104 mmol/L (ref 98–110)
Creat: 0.94 mg/dL — ABNORMAL HIGH (ref 0.60–0.88)
GFR, Est African American: 62 mL/min/{1.73_m2} (ref >=60)
GFR, Est Non African American: 53 mL/min/{1.73_m2} — ABNORMAL LOW (ref >=60)
Globulin: 3.4 g/dL (calc) (ref 1.9–3.7)
Glucose, Bld: 61 mg/dL — ABNORMAL LOW (ref 65–99)
Potassium: 4.1 mmol/L (ref 3.5–5.3)
Sodium: 140 mmol/L (ref 135–146)
Total Bilirubin: 0.5 mg/dL (ref 0.2–1.2)
Total Protein: 7.4 g/dL (ref 6.1–8.1)

## 2018-11-08 LAB — CBC WITH DIFFERENTIAL/PLATELET
Absolute Monocytes: 594 cells/uL (ref 200–950)
Basophils Absolute: 11 cells/uL (ref 0–200)
Basophils Relative: 0.3 %
Eosinophils Absolute: 50 cells/uL (ref 15–500)
Eosinophils Relative: 1.4 %
HCT: 34.7 % — ABNORMAL LOW (ref 35.0–45.0)
Hemoglobin: 12 g/dL (ref 11.7–15.5)
Lymphs Abs: 1310 cells/uL (ref 850–3900)
MCH: 33 pg (ref 27.0–33.0)
MCHC: 34.6 g/dL (ref 32.0–36.0)
MCV: 95.3 fL (ref 80.0–100.0)
MPV: 10.6 fL (ref 7.5–12.5)
Monocytes Relative: 16.5 %
Neutro Abs: 1634 cells/uL (ref 1500–7800)
Neutrophils Relative %: 45.4 %
Platelets: 136 10*3/uL — ABNORMAL LOW (ref 140–400)
RBC: 3.64 10*6/uL — ABNORMAL LOW (ref 3.80–5.10)
RDW: 11.8 % (ref 11.0–15.0)
Total Lymphocyte: 36.4 %
WBC: 3.6 10*3/uL — ABNORMAL LOW (ref 3.8–10.8)

## 2018-11-21 ENCOUNTER — Other Ambulatory Visit: Payer: Self-pay

## 2018-11-21 ENCOUNTER — Encounter: Payer: Self-pay | Admitting: Family

## 2018-11-21 ENCOUNTER — Ambulatory Visit (INDEPENDENT_AMBULATORY_CARE_PROVIDER_SITE_OTHER): Payer: Medicare Other | Admitting: Family

## 2018-11-21 DIAGNOSIS — I10 Essential (primary) hypertension: Secondary | ICD-10-CM | POA: Diagnosis not present

## 2018-11-21 DIAGNOSIS — F0391 Unspecified dementia with behavioral disturbance: Secondary | ICD-10-CM

## 2018-11-21 NOTE — Progress Notes (Signed)
This service is provided via telemedicine  No vital signs collected/recorded due to the encounter was a telemedicine visit.   Location of patient (ex: home, work):  Home   Patient consents to a telephone visit:  Yes   Location of the provider (ex: office, home):  Office   Names of all persons participating in the telemedicine service and their role in the encounter. Ruthell Rummage CMA, Eron Staat NP, Cherylann Ratel and  daughter Kamerin Axford  Time spent on call:  Ruthell Rummage CMA spent 5  Minutes on phone with patient.     Location:   [facility]    Place of Service:   [place]  Provider: Marlowe Sax NP   Code Status: FULL Goals of Care:  Advanced Directives 11/20/2016  Does Patient Have a Medical Advance Directive? Yes  Type of Advance Directive [place] in Chart? No - copy requested  Would patient like information on creating a medical advance directive? -     Chief Complaint  Patient presents with  . Acute Visit    Daughter would like to discuss behavioral changes patient is having along with next steps to take for care     HPI: Patient is a 85 y.o. female seen today to discuss medication.Patient's daughter wonders Seroquel dose needs to be increased patient still has some combative behaviors.she has been giving Seroquel 12.5 mg tablet daily though daughter states patient a dose once in a while.she would also like to know whether patient still needs metoprolol tablet.she does not checked her blood pressure at home.  Daughter still working on getting her into a facility for higher level of care.she has found a facility that she is trying to get patient in.Daughter states does not get any help from the rest of the sibling and it's getting to her health too.No signs of fever,chills,URI or UTI symptoms.   Past Medical History:  Diagnosis Date  . Allergic rhinitis, cause unspecified   . Angioneurotic edema  not elsewhere classified   . Anxiety disorder   . Asthma   . Carotid bruit   . Carpal tunnel syndrome   . Chronic abdominal pain   . Fecal incontinence   . GERD (gastroesophageal reflux disease)   . Hyperlipidemia   . IBS (irritable bowel syndrome)   . Insomnia   . Lung nodule   . Osteoarthritis   . Palpitations   . Pancytopenia ([place])   . Senile osteoporosis     Past Surgical History:  Procedure Laterality Date  . ABDOMINAL HYSTERECTOMY  1979   fibroids   . bilateral cataract surgery  2008  . BIOPSY THYROID  1970   . COLONOSCOPY  12/21/04   normal -redundant   . EUS  08/07/07  . EYE SURGERY      Allergies  Allergen Reactions  . Diphenhydramine Hcl Other (See Comments)    unknown  . Rivastigmine Tartrate Palpitations    Outpatient Encounter Medications as of 11/21/2018  Medication Sig  . calcium-vitamin D (OSCAL WITH D) 500-200 MG-UNIT tablet Take 1 tablet by mouth daily with breakfast.  . donepezil (ARICEPT) 10 MG tablet Take 1 tablet (10 mg total) by mouth at bedtime.  . metoprolol succinate (TOPROL-XL) 25 MG 24 hr tablet TAKE 1 TABLET BY MOUTH ONCE DAILY **CHANGE  IN  DIRECTIONS**  . QUEtiapine (SEROQUEL) 25 MG tablet Take 0.5 tablets (12.5 mg total) by mouth at bedtime.   No facility-administered encounter medications  on file as of 11/21/2018.     Review of Systems:  Review of Systems  Unable to perform ROS: Dementia (additional infomation provided by patient'sdaughter )  Constitutional: Negative for activity change, appetite change, chills, fatigue and fever.  HENT: Negative for congestion, rhinorrhea, sinus pressure, sinus pain, sneezing and sore throat.   Eyes: Negative for pain, discharge, redness and itching.  Respiratory: Negative for cough, chest tightness, shortness of breath and wheezing.   Cardiovascular: Negative for chest pain, palpitations and leg swelling.  Gastrointestinal: Negative for abdominal distention, abdominal pain, constipation, diarrhea,  nausea and vomiting.  Endocrine: Negative for cold intolerance, heat intolerance, polydipsia, polyphagia and polyuria.  Genitourinary: Negative for difficulty urinating, dysuria, flank pain, frequency and urgency.  Skin: Negative for color change, pallor and rash.  Neurological: Negative for dizziness, light-headedness and headaches.  Psychiatric/Behavioral: Positive for behavioral problems, confusion and sleep disturbance. Negative for agitation. The patient is not nervous/anxious.        No recent attempts to get out of the door.some times has visual hallucination.     Health Maintenance  Topic Date Due  . TETANUS/TDAP  04/19/2015  . INFLUENZA VACCINE  02/07/2019  . DEXA SCAN  Completed  . PNA vac Low Risk Adult  Completed    Physical Exam: There were no vitals filed for this visit. There is no height or weight on file to calculate BMI. Physical Exam Unable to complete on telephone visit.  Labs reviewed: Basic Metabolic Panel: Recent Labs    04/30/18 1627 11/07/18 1200  NA 140 140  K 4.1 4.1  CL 105 104  CO2 29 32  GLUCOSE 80 61*  BUN 17 17  CREATININE 0.92* 0.94*  CALCIUM 9.2 9.1  TSH 2.11  --    Liver Function Tests: Recent Labs    04/30/18 1627 11/07/18 1200  AST 20 30  ALT 12 21  BILITOT 0.5 0.5  PROT 7.4 7.4   CBC: Recent Labs    04/30/18 1627 11/07/18 1200  WBC 4.6 3.6*  NEUTROABS  --  1,634  HGB 12.4 12.0  HCT 35.9 34.7*  MCV 96.0 95.3  PLT 149 136*   Procedures since last visit: No results found.  Assessment/Plan  1. Dementia with behavioral disturbance, unspecified dementia type ([place]) Continues to be combative at times and has visual hallucination.seroquel 12.5 mg tablet at bedtime ineffective. Increase Seroquel from 12.5 mg tablet to 25 mg tablet one by mouth at bedtime.    2. Essential hypertension Daughter request metoprolol to be d/ced patient misses a dose once in awhile.does not check blood pressure at home.encouraged to check  blood pressure and keep log.Notify provider's office if SBP> 160 or < 90/60. Continue on metoprolol 25 mg 24 hour tablet once daily.   Labs/tests ordered: None  Next appt:  4 months   Time spent with patient 50 minutes >50% time spent counseling; reviewing medical record; tests; labs; and developing future plan of care

## 2018-11-25 ENCOUNTER — Telehealth: Payer: Self-pay | Admitting: *Deleted

## 2018-11-25 ENCOUNTER — Telehealth: Payer: Self-pay | Admitting: Family

## 2018-11-25 DIAGNOSIS — Z20822 Contact with and (suspected) exposure to covid-19: Secondary | ICD-10-CM

## 2018-11-25 NOTE — Telephone Encounter (Signed)
[name] Ngetich NP called to get pt scheduled for Covid 19 test. Called both numbers on file. Left message for daughter to schedule testing. Order placed.

## 2018-11-25 NOTE — Telephone Encounter (Signed)
Received fax from [facility] stating daughter is considering [facility] as a possible new home for patient. Need FL2 From filled out and faxed back.  Placed in [name]'s folder to review, fill out and sign. To be faxed back to [facility] Fax: [phone] when completed.

## 2018-11-25 NOTE — Telephone Encounter (Signed)
Paper work received from [facility] Tel# [phone] request for FL2 filled.Patient need a PPD test and COVID-19 test.COVID-19 testing center called at [phone] triage Nurse notified will call patient for appointment for COVID-19 testing. Please call and schedule patient for PPD test at [facility] office.

## 2018-11-26 NOTE — Telephone Encounter (Signed)
Spoke with Regan Rakers daughter and scheduled an appointment for patient to come in for ppd test. Patient's appointment is Dec 02, 2018 at 2:15 pm. Informed her that they would need to come back in 2 days for reading. Daughter verbalized understanding and denied further questions.

## 2018-11-28 ENCOUNTER — Encounter: Payer: Self-pay | Admitting: Family

## 2018-11-28 ENCOUNTER — Other Ambulatory Visit: Payer: Self-pay

## 2018-11-28 ENCOUNTER — Ambulatory Visit (INDEPENDENT_AMBULATORY_CARE_PROVIDER_SITE_OTHER): Payer: Medicare Other | Admitting: Family

## 2018-11-28 ENCOUNTER — Telehealth: Payer: Self-pay

## 2018-11-28 DIAGNOSIS — R350 Frequency of micturition: Secondary | ICD-10-CM

## 2018-11-28 DIAGNOSIS — F0391 Unspecified dementia with behavioral disturbance: Secondary | ICD-10-CM | POA: Diagnosis not present

## 2018-11-28 DIAGNOSIS — R451 Restlessness and agitation: Secondary | ICD-10-CM | POA: Diagnosis not present

## 2018-11-28 DIAGNOSIS — Z20822 Contact with and (suspected) exposure to covid-19: Secondary | ICD-10-CM

## 2018-11-28 NOTE — Telephone Encounter (Signed)
[facility] called to request to contact daughter to schedule pt. For COVID 19 test.  Attempted to contact pt's. Daughter, [name] at [phone] and [phone]; left voice message at each of above numbers, to return call to schedule the appt. For COVID 19 testing.

## 2018-11-28 NOTE — Progress Notes (Signed)
This service is provided via telemedicine  No vital signs collected/recorded due to the encounter was a telemedicine visit.   Location of patient (ex: home, work):  Home  Patient consents to a telephone visit: Yes  Location of the provider (ex: office, home):  Office   Names of all persons participating in the telemedicine service and their role in the encounter:  Ruthell Rummage CMA, Parrish Daddario NP, Cherylann Ratel, and daughter [name]   Time spent on call: Ruthell Rummage CMA spent 8 Minutes on phone with patient    [facility]  Provider: Marlowe Sax, NP   Code Status: Full Goals of Care:  Advanced Directives 11/21/2018  Does Patient Have a Medical Advance Directive? Yes  Type of Advance Directive [place]  Does patient want to make changes to medical advance directive? No - Patient declined  Copy of [place] in Chart? Yes - validated most recent copy scanned in chart (See row information)  Would patient like information on creating a medical advance directive? -     Chief Complaint  Patient presents with  . Acute Visit    Combative Behavior daughter states that patient has been dealing with this since last year but has been getting worse   . Medication Management    Would like to discuss Seroquel     HPI: Patient is a 85 y.o. female seen today for an acute visit for discuss Seroquel.she states patient had an incontinent episode on Saturday wonders whether patient was too groggy from the medication.Daughter skip the dose on Tuesday but patient became too combative on Wednesday.OnThursday she more   combative and telling daughter to get a sandwich and ensure even after drinking her ensure.she does not remember after ensure or cereal is given.she also smashed her crackers added wrapped and water.when told that she has had a sandwich and ensure even after eating.she gets anxieties after answering a tele marketer's phone.    She is  eating as usual.she is awaiting placement to assisted living facility.FL 2 has been filled out here at [facility].awaiting PPD skin test has appointment on 12/02/2018.also has order for COVID-19 testing to be done at [facility] testing center.daughter states Nurse called and left voice message but does not have a number to call back for an appointment.will call Testing center then notify patient's daughter.   Past Medical History:  Diagnosis Date  . Allergic rhinitis, cause unspecified   . Angioneurotic edema not elsewhere classified   . Anxiety disorder   . Asthma   . Carotid bruit   . Carpal tunnel syndrome   . Chronic abdominal pain   . Fecal incontinence   . GERD (gastroesophageal reflux disease)   . Hyperlipidemia   . IBS (irritable bowel syndrome)   . Insomnia   . Lung nodule   . Osteoarthritis   . Palpitations   . Pancytopenia ([place])   . Senile osteoporosis     Past Surgical History:  Procedure Laterality Date  . ABDOMINAL HYSTERECTOMY  1979   fibroids   . bilateral cataract surgery  2008  . BIOPSY THYROID  1970   . COLONOSCOPY  12/21/04   normal -redundant   . EUS  08/07/07  . EYE SURGERY      Allergies  Allergen Reactions  . Diphenhydramine Hcl Other (See Comments)    unknown  . Rivastigmine Tartrate Palpitations    Outpatient Encounter Medications as of 11/28/2018  Medication Sig  . calcium-vitamin D (OSCAL WITH  D) 500-200 MG-UNIT tablet Take 1 tablet by mouth daily with breakfast.  . donepezil (ARICEPT) 10 MG tablet Take 1 tablet (10 mg total) by mouth at bedtime.  . Ensure (ENSURE) Take 237 mLs by mouth at bedtime.  . metoprolol succinate (TOPROL-XL) 25 MG 24 hr tablet TAKE 1 TABLET BY MOUTH ONCE DAILY **CHANGE  IN  DIRECTIONS**  . QUEtiapine (SEROQUEL) 25 MG tablet Take 0.5 tablets (12.5 mg total) by mouth at bedtime.  . vitamin C (ASCORBIC ACID) 500 MG tablet Take 500 mg by mouth daily.   No facility-administered encounter medications on file as of 11/28/2018.      Review of Systems:  Review of Systems  Reason unable to perform ROS: additional information provided by patient's daughter   Constitutional: Negative for appetite change, chills, fatigue and fever.  HENT: Negative for congestion, rhinorrhea, sinus pressure, sinus pain, sneezing and sore throat.   Respiratory: Negative for cough, chest tightness, shortness of breath and wheezing.   Cardiovascular: Negative for chest pain, palpitations and leg swelling.  Gastrointestinal: Negative for abdominal distention, abdominal pain, constipation, diarrhea, nausea and vomiting.  Genitourinary: Positive for frequency. Negative for difficulty urinating, dysuria and flank pain.       Had urine incontinence x 1 episode   Psychiatric/Behavioral: Positive for agitation, behavioral problems and confusion. Negative for sleep disturbance.    Health Maintenance  Topic Date Due  . TETANUS/TDAP  04/19/2015  . INFLUENZA VACCINE  02/07/2019  . DEXA SCAN  Completed  . PNA vac Low Risk Adult  Completed    Physical Exam: There were no vitals filed for this visit. There is no height or weight on file to calculate BMI. Physical Exam  Labs reviewed: Basic Metabolic Panel: Recent Labs    04/30/18 1627 11/07/18 1200  NA 140 140  K 4.1 4.1  CL 105 104  CO2 29 32  GLUCOSE 80 61*  BUN 17 17  CREATININE 0.92* 0.94*  CALCIUM 9.2 9.1  TSH 2.11  --    Liver Function Tests: Recent Labs    04/30/18 1627 11/07/18 1200  AST 20 30  ALT 12 21  BILITOT 0.5 0.5  PROT 7.4 7.4   CBC: Recent Labs    04/30/18 1627 11/07/18 1200  WBC 4.6 3.6*  NEUTROABS  --  1,634  HGB 12.4 12.0  HCT 35.9 34.7*  MCV 96.0 95.3  PLT 149 136*    Assessment/Plan 1. Dementia with behavioral disturbance, unspecified dementia type [facility]) Daughter reports increased combative behavioral after skipping Seroquel dose.worried that patient might have been too groggy.Decrease Seroquel from 25 mg tablet to 12.5 mg tablet at bedtime.    2. Agitation No temperature check daughter does not have a thermometer to check.encouraged daughter to administer medication as directed and to avoid skipping.will check for UTI. - Urinalysis, Routine w reflex microscopic; Future - Culture, Urine; Future  3. Urine frequency Unclear if got too sleepy to wake up on time to go to the bathroom. - Urinalysis, Routine w reflex microscopic; Future - Culture, Urine; Future  Labs/tests ordered: - Urinalysis, Routine w reflex microscopic; Future - Culture, Urine; Future Next appt:  12/02/2018  Time spent with patient 38 minutes >50% time spent counseling; reviewing medical record; tests; labs; and developing future plan of care

## 2018-11-28 NOTE — Telephone Encounter (Signed)
2nd attempt to call pt's daughter, [name] @ [phone] and [phone]; left message to call back to Nurse Triage at [phone] to schedule.

## 2018-12-02 ENCOUNTER — Other Ambulatory Visit: Payer: Self-pay

## 2018-12-02 ENCOUNTER — Ambulatory Visit: Payer: Self-pay

## 2018-12-02 ENCOUNTER — Telehealth: Payer: Self-pay | Admitting: *Deleted

## 2018-12-02 NOTE — Telephone Encounter (Signed)
Pts daughter called to reschedule Covid-19 testing. Rescheduled for 12/08/2018 per daughters request of time and date.

## 2018-12-08 ENCOUNTER — Other Ambulatory Visit: Payer: Self-pay

## 2018-12-08 ENCOUNTER — Ambulatory Visit: Payer: Medicare Other

## 2018-12-09 ENCOUNTER — Telehealth: Payer: Self-pay

## 2018-12-09 NOTE — Telephone Encounter (Signed)
Called and spoke to [place] patient's daughter to see if she would like to reschedule ppd for patient. Informed her that patient's FL2 form is only good for up to 30 days and she would need to reschedule before June 19. Daughter stated that she had a lot going on and was having a very difficult time with patient. She agreed to scheduling an appointment for patient to come in and have ppd screening. She also stated she has been unable to get patient to clinic for COVID testing at this time.   [name] also stated she would like to schedule a sooner follow up with [name] to see patient in office. She said she will schedule at front desk when she brings patient in for ppd screening.

## 2018-12-22 ENCOUNTER — Other Ambulatory Visit: Payer: Self-pay

## 2018-12-22 ENCOUNTER — Ambulatory Visit (INDEPENDENT_AMBULATORY_CARE_PROVIDER_SITE_OTHER): Payer: Medicare Other

## 2018-12-22 DIAGNOSIS — Z111 Encounter for screening for respiratory tuberculosis: Secondary | ICD-10-CM

## 2018-12-23 ENCOUNTER — Ambulatory Visit: Payer: Self-pay

## 2018-12-24 ENCOUNTER — Ambulatory Visit: Payer: Medicare Other

## 2018-12-24 ENCOUNTER — Telehealth: Payer: Self-pay

## 2018-12-24 ENCOUNTER — Other Ambulatory Visit: Payer: Self-pay

## 2018-12-24 DIAGNOSIS — Z111 Encounter for screening for respiratory tuberculosis: Secondary | ICD-10-CM

## 2018-12-24 LAB — TB SKIN TEST
Induration: 0 mm
TB Skin Test: NEGATIVE

## 2018-12-24 NOTE — Telephone Encounter (Signed)
Kathleen Gallegos patient's daughter requested to have Kathleen Gallegos write a letter concerning patient's medical status. Kathleen Gallegos stated she needs this letter to give her sister's as proof of patient's medical condition. Kathleen Gallegos would like to talk to [facility] concerning this letter and would like to know of the information she will be adding to the letter. She states she is having a difficult time with her sister's believing patient's condition has changed. Routine to provider. Kathleen Gallegos # is [phone].

## 2018-12-25 ENCOUNTER — Telehealth: Payer: Self-pay | Admitting: Family

## 2018-12-25 NOTE — Telephone Encounter (Signed)
Please fax PPD test result to anticipated Nursing home facility [facility] per previous daughter's request.Corona virus testing still pending.

## 2018-12-25 NOTE — Telephone Encounter (Signed)
Spoke with [name] patient's daughter about [name]'s response. [name] stated that scheduling an appointment would be possible. She would first need to reach out to her sisters and see when they would be available. She said she would call back to schedule the appointment. I also informed [name] of [name]'s schedule in office

## 2018-12-25 NOTE — Telephone Encounter (Signed)
Please schedule an office visit with patient and both daughters if possible.will need an MMSE done during visit.

## 2018-12-26 NOTE — Telephone Encounter (Signed)
Faxed to [facility]

## 2019-01-02 ENCOUNTER — Encounter: Payer: Self-pay | Admitting: Family

## 2019-01-02 ENCOUNTER — Ambulatory Visit (INDEPENDENT_AMBULATORY_CARE_PROVIDER_SITE_OTHER): Payer: Medicare Other | Admitting: Family

## 2019-01-02 ENCOUNTER — Other Ambulatory Visit: Payer: Self-pay

## 2019-01-02 VITALS — BP 118/70 | HR 65 | Temp 98.4°F | Ht 66.0 in | Wt 139.0 lb

## 2019-01-02 DIAGNOSIS — F0391 Unspecified dementia with behavioral disturbance: Secondary | ICD-10-CM | POA: Diagnosis not present

## 2019-01-02 NOTE — Progress Notes (Signed)
Provider: Marlowe Sax FNP-C  Nikash Mortensen, Nelda Bucks, NP  Patient Care Team: Jalaiya Oyster, Nelda Bucks, NP as PCP - General (Family Medicine) Whitney Muse, Kelby Fam, MD (Inactive) as Consulting Physician (Hematology and Oncology) Rutherford Guys, MD as Consulting Physician (Ophthalmology)  Extended Emergency Contact Information Primary Emergency Contact: Mikey College Address: [address]          [place], [place] [address] Johnnette Litter of [place] Phone: [phone] Mobile Phone: [phone] Relation: Daughter   Goals of care: Advanced Directive information Advanced Directives 11/21/2018  Does Patient Have a Medical Advance Directive? Yes  Type of Advance Directive [place]  Does patient want to make changes to medical advance directive? No - Patient declined  Copy of [place] in Chart? Yes - validated most recent copy scanned in chart (See row information)  Would patient like information on creating a medical advance directive? -     Chief Complaint  Patient presents with  . Follow-up    Family discussion with daughters and patient about care  . MMSE    19/30 failed clock    HPI:  Pt is a 85 y.o. female seen today for an acute visit to discuss with daughter patient's memory decline.she is here escorted by her three daughters.Patient lives in her house with her daughter Tye [place] who is the primary care giver.Tye [place] provides HPI during visit.she states her younger sister who lives in [facility] is the [place].Tye [place] states would like assistance for the patient with her ADL's patient is getting more combative.Patient's daughter states getting " Burn out " and does not think her other sister understand the declining of patient's condition.she states the other two sisters do not visit or assist with patient's care.Plan was to place patient in assisted Facility at [facility].FL2 was filled out by provider and send to facility,PPD and COVID-19  screening was also done.patient's Guardian states current transfer of patient is on hold due to fear of COVID-19 infection rate in Facilities and does not want CNA's from [facility] coming in with COVID-19 either.she adds family will also have to pay out of pocket for an assistant to stay with patient which they do not have the money to do so.Also states having issues with selling patient's house prior to admitting patient in ALF. Patient voiced no concerns during visit.No recent fall episode.Appetite is good.no weight loss.   Past Medical History:  Diagnosis Date  . Allergic rhinitis, cause unspecified   . Angioneurotic edema not elsewhere classified   . Anxiety disorder   . Asthma   . Carotid bruit   . Carpal tunnel syndrome   . Chronic abdominal pain   . Fecal incontinence   . GERD (gastroesophageal reflux disease)   . Hyperlipidemia   . IBS (irritable bowel syndrome)   . Insomnia   . Lung nodule   . Osteoarthritis   . Palpitations   . Pancytopenia ([place])   . Senile osteoporosis    Past Surgical History:  Procedure Laterality Date  . ABDOMINAL HYSTERECTOMY  1979   fibroids   . bilateral cataract surgery  2008  . BIOPSY THYROID  1970   . COLONOSCOPY  12/21/04   normal -redundant   . EUS  08/07/07  . EYE SURGERY      Allergies  Allergen Reactions  . Diphenhydramine Hcl Other (See Comments)    unknown  . Rivastigmine Tartrate Palpitations    Outpatient Encounter Medications as of 01/02/2019  Medication Sig  . calcium-vitamin D (  OSCAL WITH D) 500-200 MG-UNIT tablet Take 1 tablet by mouth daily with breakfast.  . donepezil (ARICEPT) 10 MG tablet Take 1 tablet (10 mg total) by mouth at bedtime.  . Ensure (ENSURE) Take 237 mLs by mouth at bedtime.  . metoprolol succinate (TOPROL-XL) 25 MG 24 hr tablet TAKE 1 TABLET BY MOUTH ONCE DAILY **CHANGE  IN  DIRECTIONS**  . QUEtiapine (SEROQUEL) 25 MG tablet Take 0.5 tablets (12.5 mg total) by mouth at bedtime.  . vitamin C (ASCORBIC ACID) 500  MG tablet Take 500 mg by mouth daily.   No facility-administered encounter medications on file as of 01/02/2019.     Review of Systems  Unable to perform ROS: Dementia (additional information provided by patient's daughter Tye [place])  Constitutional: Negative for appetite change, chills, fatigue, fever and unexpected weight change.  HENT: Negative for congestion, rhinorrhea, sinus pressure, sinus pain, sneezing, sore throat and trouble swallowing.   Eyes: Negative for discharge, redness, itching and visual disturbance.  Respiratory: Negative for cough, chest tightness, shortness of breath and wheezing.   Cardiovascular: Negative for chest pain, palpitations and leg swelling.  Gastrointestinal: Negative for abdominal distention, abdominal pain, constipation, diarrhea, nausea and vomiting.  Endocrine: Negative for cold intolerance, heat intolerance, polydipsia, polyphagia and polyuria.  Genitourinary: Negative for difficulty urinating, dysuria, flank pain, frequency and urgency.  Musculoskeletal: Negative for gait problem.       Walks without an assistive device  Skin: Negative for color change, pallor and rash.  Neurological: Negative for dizziness, weakness, light-headedness, numbness and headaches.  Psychiatric/Behavioral: Negative for agitation, confusion and sleep disturbance. The patient is not nervous/anxious.     Immunization History  Administered Date(s) Administered  . Influenza Split 03/27/2011, 04/09/2012  . Influenza Whole 03/31/2007, 03/29/2009, 04/12/2009, 03/14/2010  . Influenza, High Dose Seasonal PF 04/30/2018  . Influenza,inj,Quad PF,6+ Mos 03/31/2013, 04/07/2014, 03/08/2015, 02/23/2016, 06/06/2016, 06/12/2017  . PPD Test 06/12/2017, 12/22/2018  . Pneumococcal Conjugate-13 08/05/2014  . Pneumococcal Polysaccharide-23 02/11/2006  . Td 04/18/2005   Pertinent  Health Maintenance Due  Topic Date Due  . INFLUENZA VACCINE  02/07/2019  . DEXA SCAN  Completed  . PNA vac Low  Risk Adult  Completed   Fall Risk  01/02/2019 11/21/2018 10/15/2018 04/30/2018 10/19/2016  Falls in the past year? 0 0 0 Yes No  Number falls in past yr: 0 0 0 1 -  Injury with Fall? 0 0 0 No -  Risk for fall due to : - - - - -    Vitals:   01/02/19 1418  BP: 118/70  Pulse: 65  Temp: 98.4 F (36.9 C)  TempSrc: Oral  SpO2: 96%  Weight: 139 lb (63 kg)  Height: 5\' 6"  (1.676 m)   Body mass index is 22.44 kg/m. Physical Exam Vitals signs reviewed.  Constitutional:      General: She is not in acute distress.    Appearance: She is normal weight. She is not ill-appearing.  HENT:     Head: Normocephalic.     Right Ear: Tympanic membrane, ear canal and external ear normal. There is no impacted cerumen.     Left Ear: Tympanic membrane, ear canal and external ear normal. There is no impacted cerumen.     Nose: Nose normal. No congestion or rhinorrhea.     Mouth/Throat:     Mouth: Mucous membranes are moist.     Pharynx: Oropharynx is clear. No oropharyngeal exudate or posterior oropharyngeal erythema.  Eyes:     General: No scleral icterus.  Right eye: No discharge.        Left eye: No discharge.     Conjunctiva/sclera: Conjunctivae normal.     Pupils: Pupils are equal, round, and reactive to light.  Neck:     Musculoskeletal: Normal range of motion. No neck rigidity or muscular tenderness.  Cardiovascular:     Rate and Rhythm: Normal rate and regular rhythm.     Pulses: Normal pulses.     Heart sounds: Normal heart sounds. No murmur. No friction rub. No gallop.   Pulmonary:     Effort: Pulmonary effort is normal. No respiratory distress.     Breath sounds: Normal breath sounds. No wheezing, rhonchi or rales.  Chest:     Chest wall: No tenderness.  Abdominal:     General: Bowel sounds are normal. There is no distension.     Palpations: Abdomen is soft. There is no mass.     Tenderness: There is no abdominal tenderness. There is no right CVA tenderness, left CVA tenderness,  guarding or rebound.  Musculoskeletal: Normal range of motion.        General: No swelling or tenderness.     Right lower leg: No edema.     Left lower leg: No edema.  Lymphadenopathy:     Cervical: No cervical adenopathy.  Skin:    General: Skin is warm and dry.     Coloration: Skin is not pale.     Findings: No bruising, erythema or rash.  Neurological:     Mental Status: She is alert.     Cranial Nerves: No cranial nerve deficit.     Sensory: No sensory deficit.     Motor: No weakness.     Coordination: Coordination normal.     Gait: Gait normal.     Comments: Scored 19/30 on MMSE.  Psychiatric:        Mood and Affect: Mood normal.        Speech: Speech normal.        Behavior: Behavior normal.        Thought Content: Thought content normal.        Cognition and Memory: Memory is impaired.        Judgment: Judgment normal.     Comments: Scored 19/30 on MMSE     Labs reviewed: Recent Labs    04/30/18 1627 11/07/18 1200  NA 140 140  K 4.1 4.1  CL 105 104  CO2 29 32  GLUCOSE 80 61*  BUN 17 17  CREATININE 0.92* 0.94*  CALCIUM 9.2 9.1   Recent Labs    04/30/18 1627 11/07/18 1200  AST 20 30  ALT 12 21  BILITOT 0.5 0.5  PROT 7.4 7.4   Recent Labs    04/30/18 1627 11/07/18 1200  WBC 4.6 3.6*  NEUTROABS  --  1,634  HGB 12.4 12.0  HCT 35.9 34.7*  MCV 96.0 95.3  PLT 149 136*   Lab Results  Component Value Date   TSH 2.11 04/30/2018   No results found for: HGBA1C Lab Results  Component Value Date   CHOL 199 10/09/2016   HDL 87 10/09/2016   LDLCALC 103 (H) 10/09/2016   TRIG 43 10/09/2016   CHOLHDL 2.3 10/09/2016    Significant Diagnostic Results in last 30 days:  No results found.  Assessment/Plan  Dementia with behavioral disturbance, unspecified dementia type (HCC) Afebrile.Weight normal.Negative exam findings.Scored 19 /30 on MMSE showing mild cognitive impairment.patient calm during visit.Requires higher level of care with her  increase  combativeness with ADL's though daughter has stopped giving Seroquel 25 mg tablet as directed.Daughter will resume Seroquel 12.5 mg tablet daily.Guardian will continue with process to sell patient's house and arrange for CNA to assist with ADl's.[employer] provided.I've discussed at length with family that patient's combative is signs of dementia.Education material on dementia also provided.Also encouraged to attend Dementia support group in the community when COVID-19 restrictions are over. Recommend ALF for assistance with ADL's and safety.   Family/ staff Communication: Reviewed plan of care with patient and three patient's daughters.  Labs/tests ordered: None  Time spent with patient and three daughters 40 minutes >50% time spent counseling; reviewing medical record; tests; labs; and developing future plan of care  [name], NP

## 2019-02-26 ENCOUNTER — Telehealth: Payer: Self-pay | Admitting: Internal Medicine

## 2019-02-26 NOTE — Telephone Encounter (Signed)
Called and spoke with pt daughter, [name] (on [place]), regarding CY's results and recommendations. [name] verbalized understanding with no additional questions. Nothing further needed at this time.

## 2019-02-26 NOTE — Telephone Encounter (Signed)
Call returned to patient daughter [name] [facility]), she reports she use to receive allergy shots but CY d/c them. She states she now has a runny nose and would like suggestions for something OTC for a runny nose. She reports the patient has developed dementia so she is not able to communicate with her but she would OTC recommendations for something for allergies. She says she cant tell when it starts but it is worse at night.   CY please advise. Thanks.

## 2019-02-26 NOTE — Telephone Encounter (Signed)
Sure- Suggest a non-sedating antihistamine like loratadine( Claritin) once daily while needed  Also may help to use fluticasone (Flonase) nasal spray   1-2 puffs each nostril once daily at bedtime, while needed. This one needs to build up a few days to work best, so use it at least several days at a time if needed during allergy season.

## 2019-03-25 ENCOUNTER — Other Ambulatory Visit: Payer: Self-pay

## 2019-03-25 ENCOUNTER — Ambulatory Visit (INDEPENDENT_AMBULATORY_CARE_PROVIDER_SITE_OTHER): Payer: Medicare Other | Admitting: Family

## 2019-03-25 ENCOUNTER — Encounter: Payer: Self-pay | Admitting: Family

## 2019-03-25 VITALS — BP 118/70 | HR 67 | Temp 98.2°F | Ht 66.0 in | Wt 138.0 lb

## 2019-03-25 DIAGNOSIS — H6123 Impacted cerumen, bilateral: Secondary | ICD-10-CM | POA: Diagnosis not present

## 2019-03-25 DIAGNOSIS — F0391 Unspecified dementia with behavioral disturbance: Secondary | ICD-10-CM

## 2019-03-25 DIAGNOSIS — I10 Essential (primary) hypertension: Secondary | ICD-10-CM | POA: Diagnosis not present

## 2019-03-25 DIAGNOSIS — Z23 Encounter for immunization: Secondary | ICD-10-CM

## 2019-03-25 DIAGNOSIS — J301 Allergic rhinitis due to pollen: Secondary | ICD-10-CM

## 2019-03-25 MED ORDER — DEBROX 6.5 % OT SOLN: 5.0000 [drp] | Freq: Two times a day (BID) | OTIC | 0 refills | Status: AC

## 2019-03-25 NOTE — Progress Notes (Addendum)
Provider: Marlowe Sax FNP-C   , Nelda Bucks, NP  Patient Care Team: , Nelda Bucks, NP as PCP - General (Family Medicine) Whitney Muse Kelby Fam, MD (Inactive) as Consulting Physician (Hematology and Oncology) Rutherford Guys, MD as Consulting Physician (Ophthalmology)  Extended Emergency Contact Information Primary Emergency Contact: Mikey College Address: [address]          [place], [place] [address] Johnnette Litter of [place] Phone: [phone] Mobile Phone: [phone] Relation: Daughter  Code Status: Full Code  Goals of care: Advanced Directive information Advanced Directives 11/21/2018  Does Patient Have a Medical Advance Directive? Yes  Type of Advance Directive [place]  Does patient want to make changes to medical advance directive? No - Patient declined  Copy of [place] in Chart? Yes - validated most recent copy scanned in chart (See row information)  Would patient like information on creating a medical advance directive? -     Chief Complaint  Patient presents with  . Medical Management of Chronic Issues    4 month follow up patient daughter states she is currently trying to get assisted care for patient 2 days a week  . Quality Metric Gaps    Flu Shot     HPI:  Pt is a 85 y.o. female seen today for medical management of chronic diseases.Daughter [name] states patient continues to be confused and not performing her grooming daily.she layers her clothing.she continues to argue with her daughter.she states eats well.she feeds self.Daughter trying to get assistance for a CNA twice a week to assist patient with her ADL's.    Hypertension - no home blood pressure log for review.She denies any signs of hyper/hypotesion.currently on Metoprolol succinate 25 mg tablet one by mouth daily.  GERD- states symptoms under control.  Asthma - no flare ups reported.   Allergies -seasonal no issues reported this visit.Daughter  states gives her OTC allergies medication during pollen season as needed. Recently seen by Pulmonologist loratadine and Flonase ordered.  Vit D- not taking her supplements.   Osteoporosis - no recent fractures.Has stopped taking vit D and calcium supplement.   Osteoarthritis -takes Tylenol as needed for pain.  Need flu shot this visit     Past Medical History:  Diagnosis Date  . Allergic rhinitis, cause unspecified   . Angioneurotic edema not elsewhere classified   . Anxiety disorder   . Asthma   . Carotid bruit   . Carpal tunnel syndrome   . Chronic abdominal pain   . Fecal incontinence   . GERD (gastroesophageal reflux disease)   . Hyperlipidemia   . IBS (irritable bowel syndrome)   . Insomnia   . Lung nodule   . Osteoarthritis   . Palpitations   . Pancytopenia ([place])   . Senile osteoporosis    Past Surgical History:  Procedure Laterality Date  . ABDOMINAL HYSTERECTOMY  1979   fibroids   . bilateral cataract surgery  2008  . BIOPSY THYROID  1970   . COLONOSCOPY  12/21/04   normal -redundant   . EUS  08/07/07  . EYE SURGERY      Allergies  Allergen Reactions  . Diphenhydramine Hcl Other (See Comments)    unknown  . Rivastigmine Tartrate Palpitations    Allergies as of 03/25/2019      Reactions   Diphenhydramine Hcl Other (See Comments)   unknown   Rivastigmine Tartrate Palpitations      Medication List       Accurate  as of March 25, 2019  3:44 PM. If you have any questions, ask your nurse or doctor.        STOP taking these medications   calcium-vitamin D 500-200 MG-UNIT tablet Commonly known as: OSCAL WITH D Stopped by: Nelda Bucks , NP   QUEtiapine 25 MG tablet Commonly known as: SEROquel Stopped by: [name], NP     TAKE these medications   donepezil 10 MG tablet Commonly known as: ARICEPT Take 1 tablet (10 mg total) by mouth at bedtime.   Ensure Take 237 mLs by mouth daily.   metoprolol succinate 25 MG 24 hr tablet  Commonly known as: TOPROL-XL TAKE 1 TABLET BY MOUTH ONCE DAILY **CHANGE  IN  DIRECTIONS**   vitamin C 500 MG tablet Commonly known as: ASCORBIC ACID Take 500 mg by mouth daily.       Review of Systems  HENT: Positive for rhinorrhea. Negative for congestion, dental problem, postnasal drip, sinus pressure, sinus pain, sneezing and sore throat.        Off allergies shot.Pulomonary ordered loratadine and Flonase   Eyes: Positive for visual disturbance. Negative for discharge, redness and itching.       Wears eye glasses.c/o sun hurting eyes but not the light   Respiratory: Negative for cough, chest tightness, shortness of breath and wheezing.   Cardiovascular: Negative for chest pain, palpitations and leg swelling.  Gastrointestinal: Negative for abdominal distention, abdominal pain, constipation, diarrhea, nausea and vomiting.  Psychiatric/Behavioral: Positive for confusion.    Immunization History  Administered Date(s) Administered  . Influenza Split 03/27/2011, 04/09/2012  . Influenza Whole 03/31/2007, 03/29/2009, 04/12/2009, 03/14/2010  . Influenza, High Dose Seasonal PF 04/30/2018  . Influenza,inj,Quad PF,6+ Mos 03/31/2013, 04/07/2014, 03/08/2015, 02/23/2016, 06/06/2016, 06/12/2017  . PPD Test 06/12/2017, 12/22/2018  . Pneumococcal Conjugate-13 08/05/2014  . Pneumococcal Polysaccharide-23 02/11/2006  . Td 04/18/2005   Pertinent  Health Maintenance Due  Topic Date Due  . INFLUENZA VACCINE  02/07/2019  . DEXA SCAN  Completed  . PNA vac Low Risk Adult  Completed   Fall Risk  03/25/2019 01/02/2019 11/21/2018 10/15/2018 04/30/2018  Falls in the past year? 0 0 0 0 Yes  Number falls in past yr: 0 0 0 0 1  Injury with Fall? 0 0 0 0 No  Risk for fall due to : - - - - -     Vitals:   03/25/19 1528  BP: 118/70  Pulse: 67  Temp: 98.2 F (36.8 C)  TempSrc: Oral  SpO2: 96%  Weight: 138 lb (62.6 kg)  Height: 5\' 6"  (1.676 m)   Body mass index is 22.27 kg/m. Physical Exam  Constitutional:      General: She is not in acute distress.    Appearance: She is normal weight. She is not ill-appearing.  HENT:     Head: Normocephalic.     Right Ear: There is impacted cerumen.     Left Ear: There is impacted cerumen.     Nose: Nose normal. No congestion or rhinorrhea.     Mouth/Throat:     Mouth: Mucous membranes are moist.     Pharynx: Oropharynx is clear. No oropharyngeal exudate or posterior oropharyngeal erythema.  Eyes:     General: No scleral icterus.       Right eye: No discharge.        Left eye: No discharge.     Extraocular Movements: Extraocular movements intact.     Conjunctiva/sclera: Conjunctivae normal.     Pupils:  Pupils are equal, round, and reactive to light.  Neck:     Musculoskeletal: Normal range of motion. No neck rigidity or muscular tenderness.     Vascular: No carotid bruit.  Cardiovascular:     Rate and Rhythm: Normal rate and regular rhythm.     Pulses: Normal pulses.     Heart sounds: Normal heart sounds. No murmur. No friction rub. No gallop.   Pulmonary:     Effort: Pulmonary effort is normal. No respiratory distress.     Breath sounds: Normal breath sounds. No wheezing, rhonchi or rales.  Chest:     Chest wall: No tenderness.  Abdominal:     General: Bowel sounds are normal. There is no distension.     Palpations: There is no mass.     Tenderness: There is no abdominal tenderness. There is no right CVA tenderness, left CVA tenderness, guarding or rebound.  Musculoskeletal: Normal range of motion.        General: No swelling or tenderness.     Right lower leg: No edema.     Left lower leg: No edema.  Lymphadenopathy:     Cervical: No cervical adenopathy.  Skin:    General: Skin is warm and dry.     Coloration: Skin is not pale.     Findings: No bruising, erythema, lesion or rash.  Neurological:     Mental Status: She is alert. Mental status is at baseline.     Cranial Nerves: No cranial nerve deficit.     Sensory: No  sensory deficit.     Motor: No weakness.     Coordination: Coordination normal.     Gait: Gait normal.  Psychiatric:        Mood and Affect: Mood normal.        Speech: Speech normal.        Behavior: Behavior is cooperative.        Thought Content: Thought content normal.        Cognition and Memory: Cognition is impaired. Memory is impaired.        Judgment: Judgment normal.     Comments: Argues with daughter during visit     Labs reviewed: Recent Labs    04/30/18 1627 11/07/18 1200  NA 140 140  K 4.1 4.1  CL 105 104  CO2 29 32  GLUCOSE 80 61*  BUN 17 17  CREATININE 0.92* 0.94*  CALCIUM 9.2 9.1   Recent Labs    04/30/18 1627 11/07/18 1200  AST 20 30  ALT 12 21  BILITOT 0.5 0.5  PROT 7.4 7.4   Recent Labs    04/30/18 1627 11/07/18 1200  WBC 4.6 3.6*  NEUTROABS  --  1,634  HGB 12.4 12.0  HCT 35.9 34.7*  MCV 96.0 95.3  PLT 149 136*   Lab Results  Component Value Date   TSH 2.11 04/30/2018   No results found for: HGBA1C Lab Results  Component Value Date   CHOL 199 10/09/2016   HDL 87 10/09/2016   LDLCALC 103 (H) 10/09/2016   TRIG 43 10/09/2016   CHOLHDL 2.3 10/09/2016    Significant Diagnostic Results in last 30 days:  No results found.  Assessment/Plan 1. Essential hypertension B/p controlled.continue on on Metoprolol succinate 25 mg tablet one by mouth daily.Not on ASA and Statin due to advance age.CR at baseline.    2. Primary osteoarthritis involving multiple joints Continue on Tylenol as needed for pain.   3. Allergic rhinitis due to pollen,  unspecified seasonality Symptoms controlled.continue on Loratadine and flonase as directed by Pulmonologist.   4. Age-related osteoporosis without current pathological fracture No fracture.off her Calcium and vit D.   5. Dementia with behavioral disturbance, unspecified dementia type ([place]) Progressive decline expected.contine with supportive care.patient's daughter requires assistance caring for  patient.still working on placement to assisted living as previous discussed in previous visit.states her sibling still not helping with patient's care.   6.Need for influenza vaccine Afebrile.No signs of URI's.Influenza vaccine administered by CMA.  Family/ staff Communication: Reviewed plan of care with patient and daughter [name].   Labs/tests ordered: None    C , NP

## 2019-04-03 ENCOUNTER — Ambulatory Visit: Payer: Medicare Other | Admitting: Family

## 2019-04-06 ENCOUNTER — Encounter: Payer: Self-pay | Admitting: Family

## 2019-04-06 ENCOUNTER — Other Ambulatory Visit: Payer: Self-pay

## 2019-04-06 ENCOUNTER — Ambulatory Visit (INDEPENDENT_AMBULATORY_CARE_PROVIDER_SITE_OTHER): Payer: Medicare Other | Admitting: Family

## 2019-04-06 VITALS — BP 124/80 | HR 72 | Ht 66.0 in | Wt 137.8 lb

## 2019-04-06 DIAGNOSIS — H6123 Impacted cerumen, bilateral: Secondary | ICD-10-CM

## 2019-04-06 NOTE — Progress Notes (Signed)
Provider: Marlowe Sax FNP-C  Ngetich, Nelda Bucks, NP  Patient Care Team: Ngetich, Nelda Bucks, NP as PCP - General (Family Medicine) Whitney Muse Kelby Fam, MD (Inactive) as Consulting Physician (Hematology and Oncology) Rutherford Guys, MD as Consulting Physician (Ophthalmology)  Extended Emergency Contact Information Primary Emergency Contact: Mikey College Address: [address]          [place], [place] [address] [place] Phone: [phone] Mobile Phone: [phone] Relation: Daughter  Code Status:  Goals of care: Advanced Directive information Advanced Directives 11/21/2018  Does Patient Have a Medical Advance Directive? Yes  Type of Advance Directive [place]  Does patient want to make changes to medical advance directive? No - Patient declined  Copy of [place] in Chart? Yes - validated most recent copy scanned in chart (See row information)  Would patient like information on creating a medical advance directive? -     Chief Complaint  Patient presents with  . Acute Visit    Impacted ears needs ear lavage     HPI:  Pt is a 85 y.o. female seen today at [facility] for an acute visit for bilateral ear lavage.she is here with her daughter cathy.she has used debrox 6.5 % otic solution as directed.she denies any ear pain,fever or chills.No URI's. Daughter still awaiting for home assistance for CNA to assist with her ADL's.   Past Medical History:  Diagnosis Date  . Allergic rhinitis, cause unspecified   . Angioneurotic edema not elsewhere classified   . Anxiety disorder   . Asthma   . Carotid bruit   . Carpal tunnel syndrome   . Chronic abdominal pain   . Fecal incontinence   . GERD (gastroesophageal reflux disease)   . Hyperlipidemia   . IBS (irritable bowel syndrome)   . Insomnia   . Lung nodule   . Osteoarthritis   . Palpitations   . Pancytopenia ([place])   . Senile osteoporosis    Past Surgical History:   Procedure Laterality Date  . ABDOMINAL HYSTERECTOMY  1979   fibroids   . bilateral cataract surgery  2008  . BIOPSY THYROID  1970   . COLONOSCOPY  12/21/04   normal -redundant   . EUS  08/07/07  . EYE SURGERY      Allergies  Allergen Reactions  . Diphenhydramine Hcl Other (See Comments)    unknown  . Rivastigmine Tartrate Palpitations    Outpatient Encounter Medications as of 04/06/2019  Medication Sig  . donepezil (ARICEPT) 10 MG tablet Take 1 tablet (10 mg total) by mouth at bedtime.  . Ensure (ENSURE) Take 237 mLs by mouth daily.   . fluticasone (FLONASE) 50 MCG/ACT nasal spray Place 1 spray into both nostrils daily.  Marland Kitchen loratadine (CLARITIN) 10 MG tablet Take 10 mg by mouth daily.  . Melatonin 5 MG TABS Take 5 mg by mouth daily.  . metoprolol succinate (TOPROL-XL) 25 MG 24 hr tablet TAKE 1 TABLET BY MOUTH ONCE DAILY **CHANGE  IN  DIRECTIONS**  . vitamin C (ASCORBIC ACID) 500 MG tablet Take 500 mg by mouth daily.   No facility-administered encounter medications on file as of 04/06/2019.     Review of Systems  Constitutional: Negative for chills, fatigue and fever.  HENT: Negative for ear discharge, ear pain, hearing loss, rhinorrhea, sinus pressure, sinus pain, sneezing and sore throat.   Eyes: Negative for discharge, redness and itching.  Respiratory: Negative for cough, chest tightness, shortness of breath and wheezing.  Cardiovascular: Negative for chest pain, palpitations and leg swelling.  Gastrointestinal: Negative for abdominal distention, abdominal pain, constipation, diarrhea, nausea and vomiting.  Skin: Negative for color change, pallor and rash.  Neurological: Negative for dizziness, light-headedness and headaches.  Psychiatric/Behavioral: Positive for confusion. Negative for agitation and behavioral problems. The patient is not nervous/anxious.     Immunization History  Administered Date(s) Administered  . Fluad Quad(high Dose 65+) 03/25/2019  . Influenza  Split 03/27/2011, 04/09/2012  . Influenza Whole 03/31/2007, 03/29/2009, 04/12/2009, 03/14/2010  . Influenza, High Dose Seasonal PF 04/30/2018  . Influenza,inj,Quad PF,6+ Mos 03/31/2013, 04/07/2014, 03/08/2015, 02/23/2016, 06/06/2016, 06/12/2017  . PPD Test 06/12/2017, 12/22/2018  . Pneumococcal Conjugate-13 08/05/2014  . Pneumococcal Polysaccharide-23 02/11/2006  . Td 04/18/2005   Pertinent  Health Maintenance Due  Topic Date Due  . INFLUENZA VACCINE  Completed  . DEXA SCAN  Completed  . PNA vac Low Risk Adult  Completed   Fall Risk  04/06/2019 03/25/2019 01/02/2019 11/21/2018 10/15/2018  Falls in the past year? 0 0 0 0 0  Number falls in past yr: 0 0 0 0 0  Injury with Fall? 0 0 0 0 0  Risk for fall due to : - - - - -   Functional Status Survey:    Vitals:   04/06/19 1546  BP: 124/80  Pulse: 72  SpO2: 97%  Weight: 137 lb 12.8 oz (62.5 kg)  Height: 5\' 6"  (1.676 m)   Body mass index is 22.24 kg/m. Physical Exam Vitals signs reviewed.  Constitutional:      General: She is not in acute distress.    Appearance: She is normal weight. She is not ill-appearing.  HENT:     Head: Normocephalic.     Right Ear: There is impacted cerumen.     Left Ear: There is impacted cerumen.     Ears:     Comments: bilateral ear lavage with warm water.moderate amounts of cerumen removed using Alligator forceps. Patient tolerated procedure well.     Nose: Nose normal. No congestion or rhinorrhea.     Mouth/Throat:     Mouth: Mucous membranes are moist.     Pharynx: Oropharynx is clear. No oropharyngeal exudate or posterior oropharyngeal erythema.  Eyes:     General: No scleral icterus.       Right eye: No discharge.        Left eye: No discharge.     Conjunctiva/sclera: Conjunctivae normal.     Pupils: Pupils are equal, round, and reactive to light.  Neck:     Musculoskeletal: Normal range of motion. No neck rigidity or muscular tenderness.     Vascular: No carotid bruit.  Cardiovascular:      Rate and Rhythm: Normal rate and regular rhythm.     Pulses: Normal pulses.     Heart sounds: Normal heart sounds. No murmur. No friction rub. No gallop.   Pulmonary:     Effort: Pulmonary effort is normal. No respiratory distress.     Breath sounds: Normal breath sounds. No wheezing, rhonchi or rales.  Chest:     Chest wall: No tenderness.  Abdominal:     General: Bowel sounds are normal. There is no distension.     Palpations: Abdomen is soft. There is no mass.     Tenderness: There is no abdominal tenderness. There is no right CVA tenderness, left CVA tenderness, guarding or rebound.  Lymphadenopathy:     Cervical: No cervical adenopathy.  Skin:    General: Skin  is warm and dry.     Coloration: Skin is not pale.     Findings: No bruising, erythema or rash.  Neurological:     Mental Status: She is oriented to person, place, and time.     Cranial Nerves: No cranial nerve deficit.     Sensory: No sensory deficit.     Motor: No weakness.     Coordination: Coordination normal.     Gait: Gait normal.  Psychiatric:        Mood and Affect: Mood normal.        Behavior: Behavior normal.        Thought Content: Thought content normal.        Cognition and Memory: Cognition is impaired. Memory is impaired.        Judgment: Judgment normal.    Labs reviewed: Recent Labs    04/30/18 1627 11/07/18 1200  NA 140 140  K 4.1 4.1  CL 105 104  CO2 29 32  GLUCOSE 80 61*  BUN 17 17  CREATININE 0.92* 0.94*  CALCIUM 9.2 9.1   Recent Labs    04/30/18 1627 11/07/18 1200  AST 20 30  ALT 12 21  BILITOT 0.5 0.5  PROT 7.4 7.4   Recent Labs    04/30/18 1627 11/07/18 1200  WBC 4.6 3.6*  NEUTROABS  --  1,634  HGB 12.4 12.0  HCT 35.9 34.7*  MCV 96.0 95.3  PLT 149 136*   Lab Results  Component Value Date   TSH 2.11 04/30/2018   No results found for: HGBA1C Lab Results  Component Value Date   CHOL 199 10/09/2016   HDL 87 10/09/2016   LDLCALC 103 (H) 10/09/2016   TRIG  43 10/09/2016   CHOLHDL 2.3 10/09/2016    Significant Diagnostic Results in last 30 days:  No results found.  Assessment/Plan   Impacted cerumen of both ears Afebrile.bilateral ear lavage with warm water.moderate amounts of cerumen removed using Alligator forceps. Patient tolerated procedure well. TM clear bilaterally.  - Ear Lavage   Family/ staff Communication: Reviewed plan of care with patient  Labs/tests ordered: None   [name] C Ngetich, NP

## 2019-04-14 ENCOUNTER — Telehealth: Payer: Self-pay | Admitting: Family

## 2019-04-14 NOTE — Telephone Encounter (Signed)
Called patient's daughter [name] back and she stated that she had called the home health agency before she called [place]. [name] went on to say she is not having concerns with the health agency but believes it is just her being paranoid. Her main concerns now are that patient seems to be sleeping later in the day and staying up later at night. She states she no longer gives patient Seroquel and she is not as concerned anymore with patient's combativeness. [name] states she has learned to deal with it. She is concerned with patient's confusion with not knowing where she is. States patient ask frequently late at night if she can go home.  Patient is very confused and is concerned about patient  Patient sleeping habits have changed. States she is just looking for some advise. She states she has been giving patient melatonin. [name] also would like to know more information about seroquel and benefits and negatives. Please advise.

## 2019-04-14 NOTE — Telephone Encounter (Signed)
Called and discussed response with [name]. [name] would like to know if patient should she still take melatonin? She states she will start giving patient Seroquel again but will give her 1/2 tab at bedtime.

## 2019-04-14 NOTE — Telephone Encounter (Signed)
Seroquel 12.5 mg tablet one by mouth at bedtime.she can continue with melatonin.

## 2019-04-14 NOTE — Telephone Encounter (Signed)
Does patient take her Seroquel? You may need to contact [place] concerning COVID-19 Concerns.

## 2019-04-14 NOTE — Telephone Encounter (Signed)
Patient's daughter Kaydan Mancias called.  She stated patient's sleeping habits have changed somewhat.  Also has concerns regarding [place] that is seeing her mother for care.  She stated concerns about Covid.  Please call [name].

## 2019-04-14 NOTE — Telephone Encounter (Signed)
Recommend giving Seroquel at bedtime to help her sleep at night and her confusion.Dementia patient tends to mix their days and nights.If not taking Seroquel may also consider Trazodone for sleep but does not help with confusion.

## 2019-04-16 NOTE — Telephone Encounter (Signed)
Called patients daughter [name] to relay [name]'s recommendation about her mothers Seroquel and Melatonin got her answering machine left message for her to call office

## 2019-04-17 NOTE — Telephone Encounter (Signed)
[name] returned call. I discussed last reply from [place]. [name] states she was surprised to get a phone call from our office yesterday  for she thought this situation was resolved.  [name] recalled having a conversation with Melissa and was told to NOT take the melatonin with seroquel per Marlowe Sax, NP.  I informed [name] that what Webb Silversmith has documented states she can continue the Melatonin with the Seroquel.  [name] states she will give her mother the Seroquel and the melatonin over the weekend since that is what is documented.   [name] asked that I consult with [name] on Monday just as a final confirmation that it is ok to continue melatonin with Seroquel.

## 2019-04-20 NOTE — Telephone Encounter (Signed)
I called Kathleen Gallegos and gave [name]'s confirmation that it is ok to take melatonin with Seroquel

## 2019-04-20 NOTE — Telephone Encounter (Signed)
Okay to take melatonin with seroquel.

## 2019-04-23 ENCOUNTER — Other Ambulatory Visit: Payer: Self-pay | Admitting: Family

## 2019-08-05 ENCOUNTER — Ambulatory Visit (INDEPENDENT_AMBULATORY_CARE_PROVIDER_SITE_OTHER): Payer: Medicare Other | Admitting: Family

## 2019-08-05 ENCOUNTER — Other Ambulatory Visit: Payer: Self-pay

## 2019-08-05 ENCOUNTER — Encounter: Payer: Self-pay | Admitting: Family

## 2019-08-05 DIAGNOSIS — M810 Age-related osteoporosis without current pathological fracture: Secondary | ICD-10-CM

## 2019-08-05 DIAGNOSIS — M8949 Other hypertrophic osteoarthropathy, multiple sites: Secondary | ICD-10-CM

## 2019-08-05 DIAGNOSIS — Z23 Encounter for immunization: Secondary | ICD-10-CM | POA: Diagnosis not present

## 2019-08-05 DIAGNOSIS — F0391 Unspecified dementia with behavioral disturbance: Secondary | ICD-10-CM

## 2019-08-05 DIAGNOSIS — I10 Essential (primary) hypertension: Secondary | ICD-10-CM | POA: Diagnosis not present

## 2019-08-05 DIAGNOSIS — J301 Allergic rhinitis due to pollen: Secondary | ICD-10-CM | POA: Diagnosis not present

## 2019-08-05 DIAGNOSIS — M159 Polyosteoarthritis, unspecified: Secondary | ICD-10-CM

## 2019-08-05 MED ORDER — TETANUS-DIPHTH-ACELL PERTUSSIS 5-2.5-18.5 LF-MCG/0.5 IM SUSP: 0.5000 mL | Freq: Once | INTRAMUSCULAR | 0 refills | Status: AC

## 2019-08-05 NOTE — Patient Instructions (Signed)
Continue current medication and supportive care.

## 2019-08-05 NOTE — Progress Notes (Addendum)
Patient ID: Kathleen Gallegos, female   DOB: [birth year], 86 y.o.   MRN: [id number] This service is provided via telemedicine  No vital signs collected/recorded due to the encounter was a telemedicine visit.   Location of patient (ex: home, work):  HOME  Patient consents to a telephone visit:  YES  Location of the provider (ex: office, home):  OFFICE  Name of any referring provider:  [facility] Taijon Vink, NP    Names of all persons participating in the telemedicine service and their role in the encounter:  PATIENT , Edwin Dada, [place], [facility], NP  Time spent on call:  3:48    Provider: Marionna Gonia FNP-C   Sebrena Engh, Nelda Bucks, NP  Patient Care Team: Jezebel Pollet, Nelda Bucks, NP as PCP - General (Family Medicine) Whitney Muse, Kelby Fam, MD (Inactive) as Consulting Physician (Hematology and Oncology) Rutherford Guys, MD as Consulting Physician (Ophthalmology)  Extended Emergency Contact Information Primary Emergency Contact: Mikey College Address: [address]          [place], [place] [address] [place] Phone: [phone] Mobile Phone: [phone] Relation: Daughter  Code Status:  Full Code  Goals of care: Advanced Directive information Advanced Directives 11/21/2018  Does Patient Have a Medical Advance Directive? Yes  Type of Advance Directive [place]  Does patient want to make changes to medical advance directive? No - Patient declined  Copy of [place] in Chart? Yes - validated most recent copy scanned in chart (See row information)  Would patient like information on creating a medical advance directive? -     Chief Complaint  Patient presents with  . Medical Management of Chronic Issues    4MTH FOLLOW-UP    HPI:  Pt is a 86 y.o. female seen today for 4 month follow up medical management of chronic diseases.Daughter Kathleen Gallegos provides HPI.Has had some changes thinks she is going home despite being home.Daughter  re-orients her.Daughter now assistance from [place] four times per week to assist with her ADL's though states has 500 hrs on her Voucher.States her sister's still not helping her with taking care of mother.Had doctor's appointment on 07/08/2019 one sister was able to watch the mother but on her next appointment she did not call her until the following day after her doctor's appointment which she ended up doing a Tele visit.she is not taking her Seroquel. Daughter had a length discussion on patient's dementia progression.    Hypertension - No blood log for review.Daughter states patient will miss metoprolol succinate 25 mg 24 Hr tablet just once in a while but tries to get to take medication.No reports of headache or dizziness.  Allergies - No runny nose of late.on Allegra 180 mg tablet not taking on a daily basis.also has felonase nasal spray daily.  Her appetite is good and drinks Ensure protein in between meals.  No recent exposure to person with COVID-19 infection.Care giver from [place] wears mask whenever she comes to the house.   She is due for Tdap vaccine.Last Tdap given in 2006.Discussed with daughter for patient to get injection at the Pharmacy.    Past Medical History:  Diagnosis Date  . Allergic rhinitis, cause unspecified   . Angioneurotic edema not elsewhere classified   . Anxiety disorder   . Asthma   . Carotid bruit   . Carpal tunnel syndrome   . Chronic abdominal pain   . Fecal incontinence   . GERD (gastroesophageal reflux disease)   .  Hyperlipidemia   . IBS (irritable bowel syndrome)   . Insomnia   . Lung nodule   . Osteoarthritis   . Palpitations   . Pancytopenia ([place])   . Senile osteoporosis    Past Surgical History:  Procedure Laterality Date  . ABDOMINAL HYSTERECTOMY  1979   fibroids   . bilateral cataract surgery  2008  . BIOPSY THYROID  1970   . COLONOSCOPY  12/21/04   normal -redundant   . EUS  08/07/07  . EYE SURGERY       Allergies  Allergen Reactions  . Diphenhydramine Hcl Other (See Comments)    unknown  . Rivastigmine Tartrate Palpitations    Allergies as of 08/05/2019      Reactions   Diphenhydramine Hcl Other (See Comments)   unknown   Rivastigmine Tartrate Palpitations      Medication List       Accurate as of August 05, 2019  3:12 PM. If you have any questions, ask your nurse or doctor.        STOP taking these medications   loratadine 10 MG tablet Commonly known as: CLARITIN Stopped by: [name], NP     TAKE these medications   donepezil 10 MG tablet Commonly known as: ARICEPT Take 1 tablet (10 mg total) by mouth at bedtime.   Ensure Take 237 mLs by mouth daily.   fexofenadine 180 MG tablet Commonly known as: ALLEGRA Take 180 mg by mouth daily.   fluticasone 50 MCG/ACT nasal spray Commonly known as: FLONASE Place 1 spray into both nostrils daily.   Melatonin 5 MG Tabs Take 5 mg by mouth daily.   metoprolol succinate 25 MG 24 hr tablet Commonly known as: TOPROL-XL TAKE 1 TABLET BY MOUTH ONCE DAILY **CHANGE  IN  DIRECTIONS**   QUEtiapine 25 MG tablet Commonly known as: SEROQUEL TAKE 1/2 (ONE-HALF) TABLET BY MOUTH AT BEDTIME   vitamin C 500 MG tablet Commonly known as: ASCORBIC ACID Take 500 mg by mouth daily.       Review of Systems  Unable to perform ROS: Dementia (Additional information provide by daughter Kathleen Gallegos )  Constitutional: Negative for appetite change, chills, fatigue and fever.  HENT: Negative for congestion, rhinorrhea, sinus pressure, sinus pain, sneezing and sore throat.   Eyes: Negative for pain, discharge, redness and itching.  Respiratory: Negative for cough, chest tightness, shortness of breath and wheezing.   Cardiovascular: Negative for chest pain, palpitations and leg swelling.  Gastrointestinal: Negative for abdominal distention, abdominal pain, constipation, diarrhea, nausea and vomiting.  Endocrine: Negative for cold  intolerance, heat intolerance, polydipsia, polyphagia and polyuria.  Genitourinary: Negative for difficulty urinating, dysuria, flank pain, frequency and urgency.  Musculoskeletal: Negative for arthralgias, gait problem and joint swelling.  Skin: Negative for color change, pallor, rash and wound.  Neurological: Negative for dizziness, speech difficulty, weakness, light-headedness, numbness and headaches.  Hematological: Does not bruise/bleed easily.  Psychiatric/Behavioral: Positive for behavioral problems and confusion. Negative for sleep disturbance. The patient is not nervous/anxious.        Combative sometimes.    Immunization History  Administered Date(s) Administered  . Fluad Quad(high Dose 65+) 03/25/2019  . Influenza Split 03/27/2011, 04/09/2012  . Influenza Whole 03/31/2007, 03/29/2009, 04/12/2009, 03/14/2010  . Influenza, High Dose Seasonal PF 04/30/2018  . Influenza,inj,Quad PF,6+ Mos 03/31/2013, 04/07/2014, 03/08/2015, 02/23/2016, 06/06/2016, 06/12/2017  . PPD Test 06/12/2017, 12/22/2018  . Pneumococcal Conjugate-13 08/05/2014  . Pneumococcal Polysaccharide-23 02/11/2006  . Td 04/18/2005   Pertinent  Health Maintenance  Due  Topic Date Due  . INFLUENZA VACCINE  Completed  . DEXA SCAN  Completed  . PNA vac Low Risk Adult  Completed   Fall Risk  08/05/2019 04/06/2019 03/25/2019 01/02/2019 11/21/2018  Falls in the past year? 0 0 0 0 0  Number falls in past yr: 0 0 0 0 0  Injury with Fall? 0 0 0 0 0  Risk for fall due to : - - - - -   There were no vitals filed for this visit. There is no height or weight on file to calculate BMI. Physical Exam Unable to complete on Telephone visit.  Labs reviewed: Recent Labs    11/07/18 1200  NA 140  K 4.1  CL 104  CO2 32  GLUCOSE 61*  BUN 17  CREATININE 0.94*  CALCIUM 9.1   Recent Labs    11/07/18 1200  AST 30  ALT 21  BILITOT 0.5  PROT 7.4   Recent Labs    11/07/18 1200  WBC 3.6*  NEUTROABS 1,634  HGB 12.0  HCT  34.7*  MCV 95.3  PLT 136*   Lab Results  Component Value Date   TSH 2.11 04/30/2018   No results found for: HGBA1C Lab Results  Component Value Date   CHOL 199 10/09/2016   HDL 87 10/09/2016   LDLCALC 103 (H) 10/09/2016   TRIG 43 10/09/2016   CHOLHDL 2.3 10/09/2016    Significant Diagnostic Results in last 30 days:  No results found.  Assessment/Plan 1. Essential hypertension No b/p log for review.Daughter states has machine but no batteries.will buy batteries then check blood pressure. - continue on metoprolol succinate 25 mg 24 Hr tablet daily.  - CBC with Differential/Platelet; Future - TSH; Future - Lipid panel; Future - CMP with eGFR([facility]); Future  2. Allergic rhinitis due to pollen, unspecified seasonality Symptoms stable.continue on Flonase one spray daily and Allegra 180 mg tablet daily / as needed as discussed.Recommended Allerga daily especially during pollen seasons.   3. Primary osteoarthritis involving multiple joints Reports no pain.may uses tylenol as needed.   4. Age-related osteoporosis without current pathological fracture T-score AP spine  -2.5 ( 04/24/2018) reviewed.Previous on calcium-Vit D supplement but d/ced to reduce medication burden due to her cognitive impairment.continue on Protein supplements   5. Dementia with behavioral disturbance, unspecified dementia type ([place]) Reports combative at times.Has assistance from [facility] four times per week to assist with her ADL's though states has 500 hrs on her Voucher.Reports the rest of the sisters not helping with her care.off Seroquel.continue on Aricept 10 mg tablet at bedtime. Continue with supportive care.Progressive decline expected due to her advance age.  6. Need for Tdap vaccine  Tdap vaccine script send to pharmacy.    Family/ staff Communication: Reviewed plan of care with patient and Daughter Kathleen Gallegos.   Labs/tests ordered: - CBC with Differential/Platelet; Future - TSH;  Future - Lipid panel; Future - CMP with eGFR([facility]); Future  Next Appointment : 6 months for medical management of chronic issues.Labs 2-4 days prior to visit.   Spent 46 minutes of non-face to face with patient    [name], NP

## 2019-08-05 NOTE — Addendum Note (Signed)
Addended byMarlowe Sax C on: 08/05/2019 09:34 PM   Modules accepted: Orders

## 2019-08-26 ENCOUNTER — Other Ambulatory Visit: Payer: Self-pay | Admitting: Family Medicine

## 2019-09-01 ENCOUNTER — Telehealth: Payer: Self-pay | Admitting: Internal Medicine

## 2019-09-01 ENCOUNTER — Telehealth: Payer: Self-pay | Admitting: *Deleted

## 2019-09-01 NOTE — Telephone Encounter (Signed)
Patient daughter notified and agreed. Only 2 allergies per daughter. Daughter has also called the Pulmonologist for their recommendation.

## 2019-09-01 NOTE — Telephone Encounter (Signed)
[name], daughter called and stated that patient has a lot of allergies to medications and she is calling to make sure it is ok for patient to get her first Covid Vaccination. Just wanted to check with her provider to make sure with patient's allergies and medical history it was ok to receive. Please Advise.

## 2019-09-01 NOTE — Telephone Encounter (Signed)
Patient is allergic to two medication benadryl and Rivastigmine.Are there Any other medication not listed ?  She has high risk of complication from [id number] infection due to her advance age.I recommend her to get vaccine per CDC guidelines.

## 2019-09-01 NOTE — Telephone Encounter (Signed)
Called back [name] (patient daughter) and advised that her mother has not been seen in our clinic since 2016. She stated her mother was being cared for by [facility] and she was waiting for them to call back. [name] stated her mother's PCP stated she did not want the patient to get the shot based on her allergies and her dementia status.  I advised that if they are recommending she does not get the vaccination to follow their direction. And to call the vaccination site to ask if they have epi pens available.  Nothing further needed at this time.

## 2019-09-05 ENCOUNTER — Other Ambulatory Visit: Payer: Self-pay | Admitting: Family Medicine

## 2019-09-11 ENCOUNTER — Telehealth: Payer: Self-pay | Admitting: Family

## 2019-09-12 ENCOUNTER — Ambulatory Visit: Payer: Medicare Other | Attending: Internal Medicine

## 2019-09-12 DIAGNOSIS — Z23 Encounter for immunization: Secondary | ICD-10-CM | POA: Insufficient documentation

## 2019-09-12 NOTE — Progress Notes (Signed)
   Covid-19 Vaccination Clinic  Name:  REAVER RAYMER    MRN: [id number] DOB: February 12, 1926  09/12/2019  Ms. Bui was observed post Covid-19 immunization for 30 minutes based on pre-vaccination screening without incident. She was provided with Vaccine Information Sheet and instruction to access the V-Safe system.   Ms. Dubose was instructed to call 911 with any severe reactions post vaccine: Marland Kitchen Difficulty breathing  . Swelling of face and throat  . A fast heartbeat  . A bad rash all over body  . Dizziness and weakness   Immunizations Administered    Name Date Dose VIS Date Route   Moderna COVID-19 Vaccine 09/12/2019 11:06 AM 0.5 mL 06/09/2019 Intramuscular   Manufacturer: Moderna   Lot: [id number]   [id number]

## 2019-09-14 ENCOUNTER — Encounter: Payer: Self-pay | Admitting: Family

## 2019-09-14 ENCOUNTER — Ambulatory Visit (INDEPENDENT_AMBULATORY_CARE_PROVIDER_SITE_OTHER): Payer: Medicare Other | Admitting: Family

## 2019-09-14 ENCOUNTER — Other Ambulatory Visit: Payer: Self-pay

## 2019-09-14 DIAGNOSIS — F411 Generalized anxiety disorder: Secondary | ICD-10-CM | POA: Diagnosis not present

## 2019-09-14 DIAGNOSIS — F0391 Unspecified dementia with behavioral disturbance: Secondary | ICD-10-CM | POA: Diagnosis not present

## 2019-09-14 MED ORDER — SERTRALINE HCL 25 MG PO TABS: 25.0000 mg | ORAL_TABLET | Freq: Every day | ORAL | 0 refills | Status: DC

## 2019-09-14 NOTE — Progress Notes (Signed)
This service is provided via telemedicine  No vital signs collected/recorded due to the encounter was a telemedicine visit.   Location of patient (ex: home, work): Home  Patient consents to a telephone visit:  Yes  Location of the provider (ex: office, home):  [employer].  Name of any referring provider:  N/A  Names of all persons participating in the telemedicine service and their role in the encounter:  Patient, Daughter [name], Heriberto Antigua, [place], Marlowe Sax, NP.   Time spent on call:  8 minutes on the phone with Medical Assistant.  Provider: Marlowe Sax FNP-C  Dakoda Laventure, Nelda Bucks, NP  Patient Care Team: Yishai Rehfeld, Nelda Bucks, NP as PCP - General (Family Medicine) Whitney Muse Kelby Fam, MD (Inactive) as Consulting Physician (Hematology and Oncology) Rutherford Guys, MD as Consulting Physician (Ophthalmology)  Extended Emergency Contact Information Primary Emergency Contact: Mikey College Address: [address]          [place], [place] [address] Johnnette Litter of [place] Phone: [phone] Mobile Phone: [phone] Relation: Daughter  Code Status:  Full Code  Goals of care: Advanced Directive information Advanced Directives 09/14/2019  Does Patient Have a Medical Advance Directive? Yes  Type of Advance Directive [place]  Does patient want to make changes to medical advance directive? No - Patient declined  Copy of [place] in Chart? Yes - validated most recent copy scanned in chart (See row information)  Would patient like information on creating a medical advance directive? -     Chief Complaint  Patient presents with  . Acute Visit    Behavioral Concerns    HPI:  Pt is a 86 y.o. female seen today for an acute visit for evaluation of Behavioral concerns.[name] daughter provides HPI information.she is having ongoing worsening combative behaviors especially going out.she is fixated with Medicare advertisement  on TV wrote the number states tries to call the Medicare.Daughter received phone call to confirm for COVID-19 vaccine but patient started swing and fighting her.she has gotten out of the car twice this year.Her behavior is unpredicted.she has an Environmental consultant who came in Oct and November using a Pension scheme manager.she stopped with the COVID-19. She is not co-operative with her care unable to comb her hair.unable to let daughter help her. She was on an antianxiety in the past daughter doesn't recall med. Daughter states her other sisters still not assisting her with patient's care since we had a visit in 2020 with all the sisters.      Past Medical History:  Diagnosis Date  . Allergic rhinitis, cause unspecified   . Angioneurotic edema not elsewhere classified   . Anxiety disorder   . Asthma   . Carotid bruit   . Carpal tunnel syndrome   . Chronic abdominal pain   . Fecal incontinence   . GERD (gastroesophageal reflux disease)   . Hyperlipidemia   . IBS (irritable bowel syndrome)   . Insomnia   . Lung nodule   . Osteoarthritis   . Palpitations   . Pancytopenia ([place])   . Senile osteoporosis    Past Surgical History:  Procedure Laterality Date  . ABDOMINAL HYSTERECTOMY  1979   fibroids   . bilateral cataract surgery  2008  . BIOPSY THYROID  1970   . COLONOSCOPY  12/21/04   normal -redundant   . EUS  08/07/07  . EYE SURGERY      Allergies  Allergen Reactions  . Diphenhydramine Hcl Other (See Comments)  unknown  . Rivastigmine Tartrate Palpitations    Outpatient Encounter Medications as of 09/14/2019  Medication Sig  . donepezil (ARICEPT) 10 MG tablet TAKE 1 TABLET BY MOUTH AT BEDTIME  . Ensure (ENSURE) Take 237 mLs by mouth daily.   . fexofenadine (ALLEGRA) 180 MG tablet Take 180 mg by mouth daily.  . fluticasone (FLONASE) 50 MCG/ACT nasal spray Place 1 spray into both nostrils daily.  . Melatonin 5 MG TABS Take 5 mg by mouth daily.  . metoprolol succinate (TOPROL-XL) 25 MG 24 hr  tablet TAKE 1 TABLET BY MOUTH ONCE DAILY **CHANGE  IN  DIRECTIONS**  . vitamin C (ASCORBIC ACID) 500 MG tablet Take 500 mg by mouth daily.  . [DISCONTINUED] QUEtiapine (SEROQUEL) 25 MG tablet TAKE 1/2 (ONE-HALF) TABLET BY MOUTH AT BEDTIME (Patient not taking: Reported on 08/05/2019)   No facility-administered encounter medications on file as of 09/14/2019.    Review of Systems  Unable to perform ROS: Dementia (Information provided by daughter [name] )  Constitutional: Negative for appetite change, chills, fatigue and fever.  HENT: Negative for congestion, postnasal drip, rhinorrhea, sinus pressure, sinus pain, sneezing and sore throat.   Respiratory: Negative for chest tightness, shortness of breath and wheezing.   Cardiovascular: Negative for chest pain, palpitations and leg swelling.  Gastrointestinal: Negative for abdominal distention, abdominal pain, constipation, diarrhea, nausea and vomiting.  Genitourinary: Negative for difficulty urinating, dysuria, flank pain, frequency and urgency.  Neurological: Negative for dizziness, speech difficulty, weakness, light-headedness, numbness and headaches.  Psychiatric/Behavioral: Positive for behavioral problems and confusion. The patient is nervous/anxious.        Melatonin effective     Immunization History  Administered Date(s) Administered  . Fluad Quad(high Dose 65+) 03/25/2019  . Influenza Split 03/27/2011, 04/09/2012  . Influenza Whole 03/31/2007, 03/29/2009, 04/12/2009, 03/14/2010  . Influenza, High Dose Seasonal PF 04/30/2018  . Influenza,inj,Quad PF,6+ Mos 03/31/2013, 04/07/2014, 03/08/2015, 02/23/2016, 06/06/2016, 06/12/2017  . Moderna SARS-COVID-2 Vaccination 09/12/2019  . PPD Test 06/12/2017, 12/22/2018  . Pneumococcal Conjugate-13 08/05/2014  . Pneumococcal Polysaccharide-23 02/11/2006  . Td 04/18/2005   Pertinent  Health Maintenance Due  Topic Date Due  . INFLUENZA VACCINE  Completed  . DEXA SCAN  Completed  . PNA vac Low  Risk Adult  Completed   Fall Risk  09/14/2019 08/05/2019 04/06/2019 03/25/2019 01/02/2019  Falls in the past year? 0 0 0 0 0  Number falls in past yr: 0 0 0 0 0  Injury with Fall? 0 0 0 0 0  Risk for fall due to : - - - - -   There were no vitals filed for this visit. There is no height or weight on file to calculate BMI. Physical Exam  Unable to complete on Telephone visit.   Labs reviewed: Recent Labs    11/07/18 1200  NA 140  K 4.1  CL 104  CO2 32  GLUCOSE 61*  BUN 17  CREATININE 0.94*  CALCIUM 9.1   Recent Labs    11/07/18 1200  AST 30  ALT 21  BILITOT 0.5  PROT 7.4   Recent Labs    11/07/18 1200  WBC 3.6*  NEUTROABS 1,634  HGB 12.0  HCT 34.7*  MCV 95.3  PLT 136*   Lab Results  Component Value Date   TSH 2.11 04/30/2018   No results found for: HGBA1C Lab Results  Component Value Date   CHOL 199 10/09/2016   HDL 87 10/09/2016   LDLCALC 103 (H) 10/09/2016   TRIG 43  10/09/2016   CHOLHDL 2.3 10/09/2016    Significant Diagnostic Results in last 30 days:  No results found.  Assessment/Plan 1. Dementia with behavioral disturbance, unspecified dementia type [facility]) Daughter [name] reports worsening combative behavior and anxiety when going out with patient.Not combing her hair and refuses for daughter to assist.Stopped Seroquel in the past states did want her sedated and risk for falls. - encouraged to continue to re-orient and avoid arguing with patient. - continue on Aricept  - Will initiate sertraline 25 mg tablet one by mouth daily for anxiety.Daughter familiar with Sertraline states taking it herself. Additional education material provided on AVS.  2. Generalized anxiety disorder Will initiate sertraline 25 mg tablet one by mouth daily for anxiety.will avoid Benzo's due to her advance age and high risk for falls.  Will follow up in 4 weeks to evaluate effect.Will increase dose if not effective.   Family/ staff Communication: Reviewed plan of care  with patient and Daughter [name].   Labs/tests ordered: None   Next Appointment: 4 weeks   Spent 23 minutes of non-face to face with patient and daughter [name].    [name], NP

## 2019-09-14 NOTE — Patient Instructions (Addendum)
-Take sertraline 25 mg tablet one by mouth daily for dementia related anxiety  Sertraline tablets What is this medicine? SERTRALINE (SER tra leen) is used to treat depression. It may also be used to treat obsessive compulsive disorder, panic disorder, post-trauma stress, premenstrual dysphoric disorder (PMDD) or social anxiety. This medicine may be used for other purposes; ask your health care provider or pharmacist if you have questions. COMMON BRAND NAME(S): Zoloft What should I tell my health care provider before I take this medicine? They need to know if you have any of these conditions:  bleeding disorders  bipolar disorder or a family history of bipolar disorder  glaucoma  heart disease  high blood pressure  history of irregular heartbeat  history of low levels of calcium, magnesium, or potassium in the blood  if you often drink alcohol  liver disease  receiving electroconvulsive therapy  seizures  suicidal thoughts, plans, or attempt; a previous suicide attempt by you or a family member  take medicines that treat or prevent blood clots  thyroid disease  an unusual or allergic reaction to sertraline, other medicines, foods, dyes, or preservatives  pregnant or trying to get pregnant  breast-feeding How should I use this medicine? Take this medicine by mouth with a glass of water. Follow the directions on the prescription label. You can take it with or without food. Take your medicine at regular intervals. Do not take your medicine more often than directed. Do not stop taking this medicine suddenly except upon the advice of your doctor. Stopping this medicine too quickly may cause serious side effects or your condition may worsen. A special MedGuide will be given to you by the pharmacist with each prescription and refill. Be sure to read this information carefully each time. Talk to your pediatrician regarding the use of this medicine in children. While this drug may  be prescribed for children as young as 7 years for selected conditions, precautions do apply. Overdosage: If you think you have taken too much of this medicine contact a poison control center or emergency room at once. NOTE: This medicine is only for you. Do not share this medicine with others. What if I miss a dose? If you miss a dose, take it as soon as you can. If it is almost time for your next dose, take only that dose. Do not take double or extra doses. What may interact with this medicine? Do not take this medicine with any of the following medications:  cisapride  dronedarone  linezolid  MAOIs like Carbex, Eldepryl, Marplan, Nardil, and Parnate  methylene blue (injected into a vein)  pimozide  thioridazine This medicine may also interact with the following medications:  alcohol  amphetamines  aspirin and aspirin-like medicines  certain medicines for depression, anxiety, or psychotic disturbances  certain medicines for fungal infections like ketoconazole, fluconazole, posaconazole, and itraconazole  certain medicines for irregular heart beat like flecainide, quinidine, propafenone  certain medicines for migraine headaches like almotriptan, eletriptan, frovatriptan, naratriptan, rizatriptan, sumatriptan, zolmitriptan  certain medicines for sleep  certain medicines for seizures like carbamazepine, valproic acid, phenytoin  certain medicines that treat or prevent blood clots like warfarin, enoxaparin, dalteparin  cimetidine  digoxin  diuretics  fentanyl  isoniazid  lithium  NSAIDs, medicines for pain and inflammation, like ibuprofen or naproxen  other medicines that prolong the QT interval (cause an abnormal heart rhythm) like dofetilide  rasagiline  safinamide  supplements like St. John's wort, kava kava, valerian  tolbutamide  tramadol  tryptophan This list may not describe all possible interactions. Give your health care provider a list of  all the medicines, herbs, non-prescription drugs, or dietary supplements you use. Also tell them if you smoke, drink alcohol, or use illegal drugs. Some items may interact with your medicine. What should I watch for while using this medicine? Tell your doctor if your symptoms do not get better or if they get worse. Visit your doctor or health care professional for regular checks on your progress. Because it may take several weeks to see the full effects of this medicine, it is important to continue your treatment as prescribed by your doctor. Patients and their families should watch out for new or worsening thoughts of suicide or depression. Also watch out for sudden changes in feelings such as feeling anxious, agitated, panicky, irritable, hostile, aggressive, impulsive, severely restless, overly excited and hyperactive, or not being able to sleep. If this happens, especially at the beginning of treatment or after a change in dose, call your health care professional. Dennis Bast may get drowsy or dizzy. Do not drive, use machinery, or do anything that needs mental alertness until you know how this medicine affects you. Do not stand or sit up quickly, especially if you are an older patient. This reduces the risk of dizzy or fainting spells. Alcohol may interfere with the effect of this medicine. Avoid alcoholic drinks. Your mouth may get dry. Chewing sugarless gum or sucking hard candy, and drinking plenty of water may help. Contact your doctor if the problem does not go away or is severe. What side effects may I notice from receiving this medicine? Side effects that you should report to your doctor or health care professional as soon as possible:  allergic reactions like skin rash, itching or hives, swelling of the face, lips, or tongue  anxious  black, tarry stools  changes in vision  confusion  elevated mood, decreased need for sleep, racing thoughts, impulsive behavior  eye pain  fast, irregular  heartbeat  feeling faint or lightheaded, falls  feeling agitated, angry, or irritable  hallucination, loss of contact with reality  loss of balance or coordination  loss of memory  painful or prolonged erections  restlessness, pacing, inability to keep still  seizures  stiff muscles  suicidal thoughts or other mood changes  trouble sleeping  unusual bleeding or bruising  unusually weak or tired  vomiting Side effects that usually do not require medical attention (report to your doctor or health care professional if they continue or are bothersome):  change in appetite or weight  change in sex drive or performance  diarrhea  increased sweating  indigestion, nausea  tremors This list may not describe all possible side effects. Call your doctor for medical advice about side effects. You may report side effects to FDA at 1-800-FDA-1088. Where should I keep my medicine? Keep out of the reach of children. Store at room temperature between 15 and 30 degrees C (59 and 86 degrees F). Throw away any unused medicine after the expiration date. NOTE: This sheet is a summary. It may not cover all possible information. If you have questions about this medicine, talk to your doctor, pharmacist, or health care provider.  2020 Elsevier/Gold Standard (2018-06-17 10:09:27)

## 2019-10-04 ENCOUNTER — Other Ambulatory Visit: Payer: Self-pay | Admitting: Family

## 2019-10-04 DIAGNOSIS — F0391 Unspecified dementia with behavioral disturbance: Secondary | ICD-10-CM

## 2019-10-04 DIAGNOSIS — F411 Generalized anxiety disorder: Secondary | ICD-10-CM

## 2019-10-13 ENCOUNTER — Ambulatory Visit (INDEPENDENT_AMBULATORY_CARE_PROVIDER_SITE_OTHER): Payer: Medicare Other | Admitting: Family

## 2019-10-13 ENCOUNTER — Encounter: Payer: Self-pay | Admitting: Family

## 2019-10-13 ENCOUNTER — Other Ambulatory Visit: Payer: Self-pay

## 2019-10-13 VITALS — BP 120/70 | HR 79 | Temp 98.2°F | Ht 66.0 in | Wt 139.0 lb

## 2019-10-13 DIAGNOSIS — F411 Generalized anxiety disorder: Secondary | ICD-10-CM

## 2019-10-13 DIAGNOSIS — F0391 Unspecified dementia with behavioral disturbance: Secondary | ICD-10-CM

## 2019-10-13 NOTE — Progress Notes (Signed)
Provider: Marlowe Sax FNP-C  Nile Prisk, Nelda Bucks, NP  Patient Care Team: Rece Zechman, Nelda Bucks, NP as PCP - General (Family Medicine) Whitney Muse Kelby Fam, MD (Inactive) as Consulting Physician (Hematology and Oncology) Rutherford Guys, MD as Consulting Physician (Ophthalmology)  Extended Emergency Contact Information Primary Emergency Contact: Mikey College Address: [address]          [place], [place] [address] Johnnette Litter of [place] Phone: [phone] Mobile Phone: [phone] Relation: Daughter  Code Status:  Full Code  Goals of care: Advanced Directive information Advanced Directives 09/14/2019  Does Patient Have a Medical Advance Directive? Yes  Type of Advance Directive [place]  Does patient want to make changes to medical advance directive? No - Patient declined  Copy of [place] in Chart? Yes - validated most recent copy scanned in chart (See row information)  Would patient like information on creating a medical advance directive? -     Chief Complaint  Patient presents with  . Medical Management of Chronic Issues    4 week follow-up    HPI:  Pt is a 86 y.o. female seen today an acute visit for 4 week follow-up of generalized anxiety.she was prescribed sertraline 25 mg tablet daily last visit 09/14/2019.Daughter states guess medication has helped some but continue to be verbally aggressive towards daughter and refuses assistance with combing of her hair.she states prior to COVID-19 she had a Voucher that was given from [employer] and was able to get an Environmental consultant to help with her shower and grooming.patient was cooperative with the assistance.she dresses in layers and has not had a hair combed.she has one more voucher that she can use but has not.she continues to be confused at times tells the daughter she wants to go home.States patient picks up her clothes and tells her she is going home.sometimes tells daughter to get out of  her house. Daughter also thinks TV commercials were making more confused.she would write numbers for medicare then tries to call medicare.Daughter states had to take phone out of her room to prevent patient from calling and answering  unknown numbers. She is more concerned about patient coming in and out of her room fears might be combative towards her. Daughter feels burn out states her sisters not helping with the care for the mother.she states her young sister [place] who lives in [place] is the patient's Guardian and in charge of patient's finance but there's no transparency how much patient gets.states sister took a loan in the name of the patient so unable to transfer patient to a facility or sale patient's house. She requires out of pocket payment for an assistant to come on a regular basis to assist patient with her activity of daily living.Ms.[name] states needs a break caring for the patient which she is not getting from her sisters.other sister recently had a knee replacement had to drop her mother to her when she had a doctor's appointment.Similar concerns voiced in the past visit and keeps repeating.  Also states patient is getting harder and harder to get out for appointments due to resistance to get ready and go out due to anxiety.Recommended increasing sertraline to 50 mg tablet but daughter declined states does not think that will work.  States might consider appealing for guardianship.  I've had a long discuss with her to talk to her sisters to get an certified Nurse assistance to provide assistance to patient for her ADL's.     Past Medical History:  Diagnosis Date  . Allergic rhinitis, cause unspecified   . Angioneurotic edema not elsewhere classified   . Anxiety disorder   . Asthma   . Carotid bruit   . Carpal tunnel syndrome   . Chronic abdominal pain   . Fecal incontinence   . GERD (gastroesophageal reflux disease)   . Hyperlipidemia   . IBS (irritable bowel syndrome)     . Insomnia   . Lung nodule   . Osteoarthritis   . Palpitations   . Pancytopenia ([place])   . Senile osteoporosis    Past Surgical History:  Procedure Laterality Date  . ABDOMINAL HYSTERECTOMY  1979   fibroids   . bilateral cataract surgery  2008  . BIOPSY THYROID  1970   . COLONOSCOPY  12/21/04   normal -redundant   . EUS  08/07/07  . EYE SURGERY      Allergies  Allergen Reactions  . Diphenhydramine Hcl Other (See Comments)    unknown  . Rivastigmine Tartrate Palpitations    Outpatient Encounter Medications as of 10/13/2019  Medication Sig  . donepezil (ARICEPT) 10 MG tablet TAKE 1 TABLET BY MOUTH AT BEDTIME  . Ensure (ENSURE) Take 237 mLs by mouth daily.   . fexofenadine (ALLEGRA) 180 MG tablet Take 180 mg by mouth daily.  . fluticasone (FLONASE) 50 MCG/ACT nasal spray Place 1 spray into both nostrils daily.  . Melatonin 5 MG TABS Take 5 mg by mouth daily.  . metoprolol succinate (TOPROL-XL) 25 MG 24 hr tablet TAKE 1 TABLET BY MOUTH ONCE DAILY **CHANGE  IN  DIRECTIONS**  . sertraline (ZOLOFT) 25 MG tablet Take 1 tablet by mouth once daily  . vitamin C (ASCORBIC ACID) 500 MG tablet Take 500 mg by mouth daily.   No facility-administered encounter medications on file as of 10/13/2019.    Review of Systems  Unable to perform ROS: Dementia (additional information provided by daughter [name] )  Constitutional: Negative for appetite change, chills, fatigue and fever.  HENT: Negative for congestion, sinus pressure, sinus pain, sneezing, sore throat and trouble swallowing.        Flonase effective for runny nose   Eyes: Positive for visual disturbance. Negative for pain, discharge, redness and itching.       Wears eye glasses   Respiratory: Negative for cough, chest tightness, shortness of breath and wheezing.   Cardiovascular: Negative for chest pain, palpitations and leg swelling.  Gastrointestinal: Negative for abdominal distention, abdominal pain, constipation, diarrhea, nausea  and vomiting.  Genitourinary: Negative for difficulty urinating, dysuria, flank pain, frequency and urgency.  Musculoskeletal: Negative for arthralgias, back pain and gait problem.  Skin: Negative for color change, pallor and rash.  Neurological: Negative for dizziness, speech difficulty, weakness, light-headedness, numbness and headaches.  Psychiatric/Behavioral: Negative for agitation, confusion and sleep disturbance. The patient is not nervous/anxious.     Immunization History  Administered Date(s) Administered  . Fluad Quad(high Dose 65+) 03/25/2019  . Influenza Split 03/27/2011, 04/09/2012  . Influenza Whole 03/31/2007, 03/29/2009, 04/12/2009, 03/14/2010  . Influenza, High Dose Seasonal PF 04/30/2018  . Influenza,inj,Quad PF,6+ Mos 03/31/2013, 04/07/2014, 03/08/2015, 02/23/2016, 06/06/2016, 06/12/2017  . Moderna SARS-COVID-2 Vaccination 09/12/2019  . PPD Test 06/12/2017, 12/22/2018  . Pneumococcal Conjugate-13 08/05/2014  . Pneumococcal Polysaccharide-23 02/11/2006  . Td 04/18/2005   Pertinent  Health Maintenance Due  Topic Date Due  . INFLUENZA VACCINE  02/07/2020  . DEXA SCAN  Completed  . PNA vac Low Risk Adult  Completed   Fall Risk  10/13/2019 09/14/2019  08/05/2019 04/06/2019 03/25/2019  Falls in the past year? 1 0 0 0 0  Number falls in past yr: 0 0 0 0 0  Injury with Fall? 0 0 0 0 0  Risk for fall due to : - - - - -    Vitals:   10/13/19 1532  BP: 120/70  Pulse: 79  Temp: 98.2 F (36.8 C)  TempSrc: Oral  SpO2: (!) 82%  Weight: 139 lb (63 kg)  Height: 5\' 6"  (1.676 m)   Body mass index is 22.44 kg/m. Physical Exam Vitals reviewed.  Constitutional:      General: She is not in acute distress.    Appearance: She is normal weight.     Comments: Unkept layered clothes and looses pants secured with shoe lace with zipper open.Shoe insert on toe of patient's dorsal leg with no shoe laces.Hair matted all around with top of the head alopecia noted.   HENT:     Head:  Normocephalic.     Right Ear: Tympanic membrane, ear canal and external ear normal. There is no impacted cerumen.     Left Ear: Tympanic membrane, ear canal and external ear normal. There is no impacted cerumen.     Nose: Nose normal. No congestion or rhinorrhea.     Mouth/Throat:     Mouth: Mucous membranes are moist.     Pharynx: Oropharynx is clear. No oropharyngeal exudate or posterior oropharyngeal erythema.  Eyes:     General: No scleral icterus.       Right eye: No discharge.        Left eye: No discharge.     Extraocular Movements: Extraocular movements intact.     Conjunctiva/sclera: Conjunctivae normal.     Pupils: Pupils are equal, round, and reactive to light.  Neck:     Vascular: No carotid bruit.  Cardiovascular:     Rate and Rhythm: Normal rate and regular rhythm.     Pulses: Normal pulses.     Heart sounds: Normal heart sounds. No murmur. No friction rub. No gallop.   Pulmonary:     Effort: Pulmonary effort is normal. No respiratory distress.     Breath sounds: Normal breath sounds. No wheezing, rhonchi or rales.  Chest:     Chest wall: No tenderness.  Abdominal:     General: Bowel sounds are normal. There is no distension.     Palpations: Abdomen is soft. There is no mass.     Tenderness: There is no abdominal tenderness. There is no right CVA tenderness, left CVA tenderness, guarding or rebound.  Musculoskeletal:        General: No swelling or tenderness. Normal range of motion.     Cervical back: Normal range of motion. No rigidity or tenderness.     Right lower leg: No edema.     Left lower leg: No edema.  Lymphadenopathy:     Cervical: No cervical adenopathy.  Skin:    General: Skin is warm and dry.     Coloration: Skin is not pale.     Findings: No bruising or erythema.  Neurological:     Mental Status: She is alert. Mental status is at baseline.     Sensory: No sensory deficit.     Motor: No weakness.     Gait: Gait normal.  Psychiatric:         Mood and Affect: Mood normal.        Speech: Speech normal.  Behavior: Behavior is cooperative.        Thought Content: Thought content normal.        Cognition and Memory: Memory is impaired.        Judgment: Judgment normal.     Comments: Argues with daughter during visit but cooperative with provider.     Labs reviewed: Recent Labs    11/07/18 1200  NA 140  K 4.1  CL 104  CO2 32  GLUCOSE 61*  BUN 17  CREATININE 0.94*  CALCIUM 9.1   Recent Labs    11/07/18 1200  AST 30  ALT 21  BILITOT 0.5  PROT 7.4   Recent Labs    11/07/18 1200  WBC 3.6*  NEUTROABS 1,634  HGB 12.0  HCT 34.7*  MCV 95.3  PLT 136*   Lab Results  Component Value Date   TSH 2.11 04/30/2018   No results found for: HGBA1C Lab Results  Component Value Date   CHOL 199 10/09/2016   HDL 87 10/09/2016   LDLCALC 103 (H) 10/09/2016   TRIG 43 10/09/2016   CHOLHDL 2.3 10/09/2016    Significant Diagnostic Results in last 30 days:  No results found.  Assessment/Plan 1. Generalized anxiety disorder Continues to be anxtious leaving the house and being in the car recommended increasing sertraline from 25 mg to 50 mg tablet daily but daughter has declined.  - continue on sertraline 25 mg tablet daily.   2. Dementia with behavioral disturbance, unspecified dementia type ([place]) - cooperative with provider during visit but argues with the daughter. Requires assistance with her grooming recommend getting an Nurse Aid to assist with her ADL's had long talk with Daughter [name] to talk with her sisters to provide needed care and resources for  Patient verbalized understanding but later when leaving room asked provided what she is supposed to talk with her sisters about patient.Also called office again asking what she needs to talk to her sister about despite long discussion concerning getting a Nurse assistance to provide ADL's for patient.considered referral to [facility] to evaluate patient's needs but per  referral co-ordinate [facility] not going out to homes due to COVID-19. Will wait for daughter to talk with patient's Guardian to get a Nurse assistant to provide ADL's for patient.I've recommended transfer patient to an Assisted living facility several times in the past visit but got held up with process trying to get benefits from [place] and property owned.States has a lawer working to help with the process.In the meal time getting an in home Nurse assistant.  Family/ staff Communication: Reviewed plan of care with patient and daughter.   Labs/tests ordered: None   Next Appointment: As needed if symptoms worsen or fail to improve.   Time spent with patient 45 minutes >50% time spent counseling;Long discuss held with patient's daughter [name] concerning need for a Nurse assistant to assist patient with her ADL's but kept repeating herself and going over patient's behaviors and how her sisters are not helping. reviewing medical record; tests; labs; and developing future plan of care   [name], NP

## 2019-10-13 NOTE — Patient Instructions (Signed)
Continue on sertraline 25 mg tablet daily.

## 2019-10-14 ENCOUNTER — Ambulatory Visit: Payer: Medicare Other | Attending: Internal Medicine

## 2019-10-14 DIAGNOSIS — Z23 Encounter for immunization: Secondary | ICD-10-CM

## 2019-10-14 NOTE — Progress Notes (Signed)
   Covid-19 Vaccination Clinic  Name:  Kathleen Gallegos    MRN: [id number] DOB: 1926/06/27  10/14/2019  [name] was observed post Covid-19 immunization for 30 minutes based on pre-vaccination screening without incident. She was provided with Vaccine Information Sheet and instruction to access the V-Safe system.   [name] was instructed to call 911 with any severe reactions post vaccine: Marland Kitchen Difficulty breathing  . Swelling of face and throat  . A fast heartbeat  . A bad rash all over body  . Dizziness and weakness   Immunizations Administered    Name Date Dose VIS Date Route   Moderna COVID-19 Vaccine 10/14/2019 12:19 PM 0.5 mL 06/09/2019 Intramuscular   Manufacturer: Levan Hurst   LotUD:[id number]   [place]:[id number]

## 2019-10-15 ENCOUNTER — Telehealth: Payer: Self-pay | Admitting: Family

## 2019-10-15 ENCOUNTER — Ambulatory Visit: Payer: Self-pay

## 2019-10-15 NOTE — Telephone Encounter (Signed)
Spoke with [name], she had some questions about [facility], I explained as best I could how [place] works, daughter is waiting on [facility] to call with resources.

## 2019-10-15 NOTE — Telephone Encounter (Signed)
Patient's daughter Sharrion Galyan called.  She stated that patient was seen by you a few days ago. [name] is not clear on some of the things that were discussed with you and needs to speak with you again.  She wants to be clear on everything before she speaks with her sisters regarding their mother.  Please call [name] at [phone].  Thank you  Adan Sis

## 2019-10-15 NOTE — Telephone Encounter (Signed)
As discussed during the visit [name] need to notify the sister Veterinary surgeon?) who is the patient's Guardian and in charge of the finances to arrange for the patient to get an assistant to assist patient with her Activity of daily living like showers,dressing and combing of hair.

## 2019-10-18 DIAGNOSIS — R410 Disorientation, unspecified: Secondary | ICD-10-CM | POA: Diagnosis not present

## 2019-10-18 DIAGNOSIS — W19XXXA Unspecified fall, initial encounter: Secondary | ICD-10-CM | POA: Diagnosis not present

## 2019-10-18 DIAGNOSIS — R0902 Hypoxemia: Secondary | ICD-10-CM | POA: Diagnosis not present

## 2019-10-21 ENCOUNTER — Telehealth: Payer: Self-pay

## 2019-10-21 NOTE — Telephone Encounter (Signed)
I called daughter [name]" back to confirm that. Patient daughter states she understands.

## 2019-10-21 NOTE — Telephone Encounter (Signed)
Thank You.

## 2019-10-21 NOTE — Telephone Encounter (Signed)
Patient daughter "Kathleen Gallegos" states that she's suppose to be receiving a call from the people on the Brochure you gave her because there was suppose to be some referral done for them to reach out.
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
## 2019-10-21 NOTE — Telephone Encounter (Signed)
On the Adult Center for [employer] [facility] there are numbers for family to contact agency referral is not required.

## 2019-10-21 NOTE — Telephone Encounter (Signed)
Patient's symptoms are due to dementia with behavioral disturbance will need to consider restarting Seroquel that daughter [name] had stopped or increase sertraline to 50 mg tablet as discussed in latest visit but [name] again had declined.Option also for placement in a facility or family to arrange for in home certified Nurse assistant to assist ( Brochure given on last visit for one of the companies or can contact other local agencies available in the community.

## 2019-11-28 ENCOUNTER — Other Ambulatory Visit: Payer: Self-pay | Admitting: Family Medicine

## 2019-11-28 DIAGNOSIS — R002 Palpitations: Secondary | ICD-10-CM

## 2019-12-01 ENCOUNTER — Telehealth: Payer: Self-pay

## 2019-12-01 DIAGNOSIS — F0391 Unspecified dementia with behavioral disturbance: Secondary | ICD-10-CM

## 2019-12-01 NOTE — Telephone Encounter (Signed)
Recommend Neurologist referral for further evaluation of type of dementia.

## 2019-12-01 NOTE — Telephone Encounter (Signed)
Referral order pending for review and completion

## 2019-12-01 NOTE — Telephone Encounter (Signed)
Referral to Neurologist signed.

## 2019-12-01 NOTE — Telephone Encounter (Signed)
Incoming call received from patients daughter stating she called earlier this month and left a message for [name] and never heard back. I apologized for the experience and was unable to locate documentation of Kathleen Gallegos calling this month.   Kathleen Gallegos states she would like a clarification on what type of dementia her mother has. Kathleen Gallegos also questions if her mothers dementia is a result of an underlying issue such as a brain tumor or carotid artery disease.  Kathleen Gallegos is aware this message may require an appointment. Please advise

## 2019-12-02 ENCOUNTER — Encounter: Payer: Self-pay | Admitting: Neurology

## 2019-12-02 NOTE — Telephone Encounter (Signed)
Spoke with [name]. [name] is aware that she will be contacted by [facility]

## 2019-12-02 NOTE — Telephone Encounter (Signed)
Called [name], no answer, and no voicemail set up on [phone]

## 2019-12-02 NOTE — Telephone Encounter (Signed)
I called [name] x 3 on [phone] (busy signal).   Left message on voicemail [phone]) for [name] to return call when available

## 2019-12-04 ENCOUNTER — Other Ambulatory Visit: Payer: Self-pay | Admitting: Family Medicine

## 2019-12-04 DIAGNOSIS — R002 Palpitations: Secondary | ICD-10-CM

## 2019-12-11 ENCOUNTER — Other Ambulatory Visit: Payer: Self-pay | Admitting: *Deleted

## 2019-12-11 DIAGNOSIS — R002 Palpitations: Secondary | ICD-10-CM

## 2019-12-11 MED ORDER — METOPROLOL SUCCINATE ER 25 MG PO TB24: ORAL_TABLET | ORAL | 1 refills | Status: DC

## 2019-12-11 NOTE — Telephone Encounter (Signed)
Patient caregiver requested refill. Faxed.

## 2019-12-22 ENCOUNTER — Other Ambulatory Visit: Payer: Self-pay | Admitting: *Deleted

## 2019-12-22 MED ORDER — DONEPEZIL HCL 10 MG PO TABS: 10.0000 mg | ORAL_TABLET | Freq: Every day | ORAL | 0 refills | Status: DC

## 2019-12-22 NOTE — Telephone Encounter (Signed)
Patient daughter called and requested refill Pended Rx and sent to [facility] for approval due to [place].

## 2019-12-23 ENCOUNTER — Ambulatory Visit: Payer: Medicare Other | Admitting: Neurology

## 2020-01-29 ENCOUNTER — Other Ambulatory Visit: Payer: Self-pay

## 2020-02-05 ENCOUNTER — Ambulatory Visit: Payer: Self-pay | Admitting: Family

## 2020-02-09 ENCOUNTER — Ambulatory Visit: Payer: Self-pay | Admitting: Family

## 2020-02-18 ENCOUNTER — Other Ambulatory Visit: Payer: Medicare Other

## 2020-02-23 ENCOUNTER — Telehealth: Payer: Self-pay

## 2020-02-23 NOTE — Telephone Encounter (Signed)
Telephone call to patients daughter Kathleen Gallegos to schedule palliative care visit.  Daughter states she has appointments scheduled on days RN offered prior to 02/27/20.  Daughter in agreement with RN making home visit 02/27/20 at 1:00 PM.

## 2020-03-08 ENCOUNTER — Telehealth: Payer: Self-pay

## 2020-03-08 ENCOUNTER — Other Ambulatory Visit: Payer: Medicare Other

## 2020-03-08 ENCOUNTER — Other Ambulatory Visit: Payer: Self-pay

## 2020-03-08 NOTE — Telephone Encounter (Signed)
SW made TC to patients daughter to confirm visit today. Daughter stated that today was not a good day and that she would prefer to reschedule. Daughter went on to inquire about palliative care services and her concerns about caregiver assistance and patients advancing dementia. SW explained palliative care services at length. Daughter in agreement with re-scheduling home visit for 03-23-2020 @230pm .

## 2020-03-11 ENCOUNTER — Other Ambulatory Visit: Payer: Medicare Other

## 2020-03-23 ENCOUNTER — Other Ambulatory Visit: Payer: Self-pay

## 2020-03-23 ENCOUNTER — Other Ambulatory Visit: Payer: Medicare Other

## 2020-03-23 DIAGNOSIS — Z515 Encounter for palliative care: Secondary | ICD-10-CM

## 2020-03-23 NOTE — Progress Notes (Signed)
COMMUNITY PALLIATIVE CARE SW NOTE  PATIENT NAME: Kathleen Gallegos DOB: 25-Nov-1925 MRN: [id number]  PRIMARY CARE PROVIDER: [name], NP  RESPONSIBLE PARTY:  Acct ID - Guarantor Home Phone Work Phone Relationship Acct Type  [id number] [name]* [phone]  Self P/F     [address], [place], [place] [address]     PLAN OF CARE and INTERVENTIONS:             GOALS OF CARE/ ADVANCE CARE PLANNING:  Patient is a DNR currently. Patient's middle daughter, Kathleen Gallegos, has guardianship of patient.  2. SOCIAL/EMOTIONAL/SPIRITUAL ASSESSMENT/ INTERVENTIONS:  SW met with patient and patient's daughter Kathleen Gallegos. Patient lives in a one story home with daughter. Daughter is primary caregiver. Daughter provided overview and update on patients care and possible needs. Patient has progressive Alzheimer's disease since 2009. Patient appetite is good. Patient sleeps well. No pain at this time. Daughter shared that patient has become more aggressive with care. Daughter very talkative during visit. Daughter states that she is need of respite stay for patient, due to daughter being burnt out. SW informed daughter that respite stay at a facility is only covered by Hospice benefits or private pay. Daughter stated understanding. There is some family dynamics that worries daughter. Patients other daughters sometimes offer assistance by taking patient to them home for a day or two, daughter shares this has not happened lately and she is becoming burnt out and fatigued. SW discussed placement options for patient, daughter shared that [facility] has reached out to her a few times, but since she is not the guardian she cannot make all/final decisions. SW discussed VA Aid &Attendance benefits, SW will outreach patient's guardian as the application needs to be completed by guardian for assistance. SW provided education on palliative care, discussed goals, reviewed care plan, provided emotional support, used active and  reflective listening. Palliative care will continue to monitor and assist with long term care planning as needed.  3. PATIENT/CAREGIVER EDUCATION/ COPING:  Patient alert with confusion. Daughter states patient had exhibited symptoms of depression. Patient enjoyed sewing and watching westerns. Patient has 3 children and a lot of grandchildren and great grandchildren. 4. PERSONAL EMERGENCY PLAN:  Family will call 9-1-1 for emergencies.  5. COMMUNITY RESOURCES COORDINATION/ HEALTH CARE NAVIGATION:  Patient's daughter manages care and is primary caregiver. Daughter stated that patient may be VA connected due to husband being in [place].  6. FINANCIAL/LEGAL CONCERNS/INTERVENTIONS: Patient on fixed income, daughter Kathleen Gallegos, is guardian over finances and person.      SOCIAL HX:  Social History   Tobacco Use  . Smoking status: Never Smoker  . Smokeless tobacco: Former Systems developer    Types: Snuff  Substance Use Topics  . Alcohol use: No    CODE STATUS:  ADVANCED DIRECTIVES: Y - Guardian  MOST FORM COMPLETE:  N HOSPICE EDUCATION PROVIDED: N  PPS: Patient is ambulatory w/o AD. Patient is able to toilet self. Patient can feed self. Patients refuses bathes and oral care at times due to dementia.   Time spent: 2 hrs.       Doreene Eland, [place]

## 2020-03-24 ENCOUNTER — Telehealth: Payer: Self-pay

## 2020-03-28 NOTE — Telephone Encounter (Signed)
SW mailed Aid and attendance application to [facility] daughter for provider to complete/sign at next MD appointment.

## 2020-03-30 ENCOUNTER — Telehealth: Payer: Self-pay

## 2020-03-30 NOTE — Telephone Encounter (Signed)
Patients daughter requested a visit for patients vital signs to be checked. Visit scheduled for Tue 04-05-2020 with SW and RN.

## 2020-04-05 ENCOUNTER — Other Ambulatory Visit: Payer: Medicare Other

## 2020-04-05 ENCOUNTER — Other Ambulatory Visit: Payer: Self-pay

## 2020-04-05 DIAGNOSIS — Z515 Encounter for palliative care: Secondary | ICD-10-CM

## 2020-04-05 NOTE — Progress Notes (Signed)
COMMUNITY PALLIATIVE CARE SW NOTE  PATIENT NAME: Kathleen Gallegos DOB: August 18, 1925 MRN: [id number]  PRIMARY CARE PROVIDER: [name], NP  RESPONSIBLE PARTY:  Acct ID - Guarantor Home Phone Work Phone Relationship Acct Type  [id number] [name]* [phone]  Self P/F     [address], [place], [place] [address]     PLAN OF CARE and INTERVENTIONS:              1. GOALS OF CARE/ADVANCE CARE PLANNING: Patient is a DNR. Patients goal is to remain at home. 2. SOCIAL/EMOTIONAL/SPIRITUAL ASSESSMENT/ INTERVENTIONS:  SW and RN [name] met with patient in patients home. This visit was a quick follow up visit for RN to check vitals and weight to assist with completing Aid and Attendance application 166/06  30% 58R 133lbs. Daughter shared that patient has neuro appt scheduled for 10/1 and will try her best to get her there. Palliative care will continue to follow, next visit scheduled. PATIENT/CAREGIVER EDUCATION/ COPING:  Patient is alert with confusion. Daughter shared that patient continues to be verbally combative, and takes aricept.  3. PERSONAL EMERGENCY PLAN:  Daughter to call 911 for emergencies.  4. COMMUNITY RESOURCES COORDINATION/ HEALTH CARE NAVIGATION:  Daughter manages care.  5. FINANCIAL/LEGAL CONCERNS/INTERVENTIONS:  None.     SOCIAL HX:  Social History   Tobacco Use  . Smoking status: Never Smoker  . Smokeless tobacco: Former Systems developer    Types: Snuff  Substance Use Topics  . Alcohol use: No    CODE STATUS: DNR ADVANCED DIRECTIVES: Y MOST FORM COMPLETE: N HOSPICE EDUCATION PROVIDED: N  PPS: patient able to ambulate w/o AD. Needs assistance with bathing, oral care, and meal prep. Patient bale to feed and toilet self.       Doreene Eland, [place]

## 2020-04-08 ENCOUNTER — Ambulatory Visit (INDEPENDENT_AMBULATORY_CARE_PROVIDER_SITE_OTHER): Payer: Medicare Other | Admitting: Neurology

## 2020-04-08 ENCOUNTER — Other Ambulatory Visit: Payer: Medicare Other

## 2020-04-08 ENCOUNTER — Other Ambulatory Visit: Payer: Self-pay

## 2020-04-08 ENCOUNTER — Encounter: Payer: Self-pay | Admitting: Neurology

## 2020-04-08 VITALS — BP 155/87 | HR 85 | Ht 66.0 in | Wt 133.8 lb

## 2020-04-08 DIAGNOSIS — F0391 Unspecified dementia with behavioral disturbance: Secondary | ICD-10-CM | POA: Diagnosis not present

## 2020-04-08 DIAGNOSIS — F03B18 Unspecified dementia, moderate, with other behavioral disturbance: Secondary | ICD-10-CM

## 2020-04-08 DIAGNOSIS — I10 Essential (primary) hypertension: Secondary | ICD-10-CM | POA: Diagnosis not present

## 2020-04-08 NOTE — Patient Instructions (Addendum)
You have a brain condition called Alzheimer's dementia. At this point, recommendation is to work with your family doctor and social worker with getting more assistance. Continue all medications, discuss medications with family doctor.
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
## 2020-04-09 LAB — CBC WITH DIFFERENTIAL/PLATELET
Absolute Monocytes: 554 cells/uL (ref 200–950)
Basophils Absolute: 20 cells/uL (ref 0–200)
Basophils Relative: 0.4 %
Eosinophils Absolute: 59 cells/uL (ref 15–500)
Eosinophils Relative: 1.2 %
HCT: 34 % — ABNORMAL LOW (ref 35.0–45.0)
Hemoglobin: 11.5 g/dL — ABNORMAL LOW (ref 11.7–15.5)
Lymphs Abs: 1612 cells/uL (ref 850–3900)
MCH: 32.4 pg (ref 27.0–33.0)
MCHC: 33.8 g/dL (ref 32.0–36.0)
MCV: 95.8 fL (ref 80.0–100.0)
MPV: 11.2 fL (ref 7.5–12.5)
Monocytes Relative: 11.3 %
Neutro Abs: 2656 cells/uL (ref 1500–7800)
Neutrophils Relative %: 54.2 %
Platelets: 143 10*3/uL (ref 140–400)
RBC: 3.55 10*6/uL — ABNORMAL LOW (ref 3.80–5.10)
RDW: 12 % (ref 11.0–15.0)
Total Lymphocyte: 32.9 %
WBC: 4.9 10*3/uL (ref 3.8–10.8)

## 2020-04-09 LAB — COMPLETE METABOLIC PANEL WITH GFR
AG Ratio: 1.1 (calc) (ref 1.0–2.5)
ALT: 7 U/L (ref 6–29)
AST: 16 U/L (ref 10–35)
Albumin: 4.1 g/dL (ref 3.6–5.1)
Alkaline phosphatase (APISO): 37 U/L (ref 37–153)
BUN/Creatinine Ratio: 14 (calc) (ref 6–22)
BUN: 14 mg/dL (ref 7–25)
CO2: 27 mmol/L (ref 20–32)
Calcium: 9.7 mg/dL (ref 8.6–10.4)
Chloride: 102 mmol/L (ref 98–110)
Creat: 0.98 mg/dL — ABNORMAL HIGH (ref 0.60–0.88)
GFR, Est African American: 58 mL/min/{1.73_m2} — ABNORMAL LOW (ref >=60)
GFR, Est Non African American: 50 mL/min/{1.73_m2} — ABNORMAL LOW (ref >=60)
Globulin: 3.8 g/dL (calc) — ABNORMAL HIGH (ref 1.9–3.7)
Glucose, Bld: 92 mg/dL (ref 65–99)
Potassium: 4.1 mmol/L (ref 3.5–5.3)
Sodium: 137 mmol/L (ref 135–146)
Total Bilirubin: 0.6 mg/dL (ref 0.2–1.2)
Total Protein: 7.9 g/dL (ref 6.1–8.1)

## 2020-04-09 LAB — TSH: TSH: 3.44 mIU/L (ref 0.40–4.50)

## 2020-04-09 LAB — LIPID PANEL
Cholesterol: 217 mg/dL — ABNORMAL HIGH (ref <200)
HDL: 70 mg/dL (ref >=50)
LDL Cholesterol (Calc): 130 mg/dL (calc) — ABNORMAL HIGH
Non-HDL Cholesterol (Calc): 147 mg/dL (calc) — ABNORMAL HIGH (ref <130)
Total CHOL/HDL Ratio: 3.1 (calc) (ref <5.0)
Triglycerides: 77 mg/dL (ref <150)

## 2020-04-13 ENCOUNTER — Telehealth: Payer: Self-pay

## 2020-04-13 NOTE — Telephone Encounter (Signed)
Palliative care SW LVM for patients daughter [name]. Awaiting return call.

## 2020-04-22 ENCOUNTER — Telehealth: Payer: Self-pay

## 2020-04-22 ENCOUNTER — Other Ambulatory Visit: Payer: Self-pay

## 2020-04-22 ENCOUNTER — Other Ambulatory Visit: Payer: Medicare Other

## 2020-04-22 DIAGNOSIS — Z515 Encounter for palliative care: Secondary | ICD-10-CM

## 2020-04-22 NOTE — Progress Notes (Signed)
COMMUNITY PALLIATIVE CARE SW NOTE  PATIENT NAME: Kathleen Gallegos DOB: 1926-04-02 MRN: [id number]  PRIMARY CARE PROVIDER: [name], NP  RESPONSIBLE PARTY:  Acct ID - Guarantor Home Phone Work Phone Relationship Acct Type  [id number] Kathleen Gallegos* [phone]  Self P/F     [place] [address], [place], [place] [address]     PLAN OF CARE and INTERVENTIONS:             1. GOALS OF CARE/ ADVANCE CARE PLANNING:  Patient is a DNR currently. Patient's middle daughter, Kathleen Gallegos, has guardianship of patient.  2.         SOCIAL/EMOTIONAL/SPIRITUAL ASSESSMENT/ INTERVENTIONS:  SW met with patient and patient's daughter Kathleen Gallegos. Patient lives in a one story home with daughter. Daughter is primary caregiver. Daughter provided overview and update on patients care and possible needs. SW discussed VA Aid &Attendance benefits, daughter provided SW with supportive forms for application. Daughter outreached her sister while during visit to request additional forms, sister did not answer. Patient saw neuro Dr. Jonette Pesa and verified that patient has Alzheimer's dementia and suggested a family meeting and possible ALF placement with a memory care unit. SW agree with recommendation. SW discussed goals, reviewed care plan, provided emotional support, used active and reflective listening. Palliative care will continue to monitor and assist with long term care planning as needed.  3.         PATIENT/CAREGIVER EDUCATION/ COPING:  Patient alert with confusion. Daughter states patient had exhibited symptoms of depression. Patient enjoyed sewing and watching westerns. Patient has 3 children and a lot of grandchildren and great grandchildren. 4.         PERSONAL EMERGENCY PLAN:  Family will call 9-1-1 for emergencies.  5.         COMMUNITY RESOURCES COORDINATION/ HEALTH CARE NAVIGATION:  Patient's daughter manages care and is primary caregiver.  6.         FINANCIAL/LEGAL CONCERNS/INTERVENTIONS: Patient on fixed income, daughter  Kathleen Gallegos, is guardian over finances and person.       SOCIAL HX:  Social History   Tobacco Use  . Smoking status: Never Smoker  . Smokeless tobacco: Former Systems developer    Types: Snuff  Substance Use Topics  . Alcohol use: No    CODE STATUS: DNR ADVANCED DIRECTIVES: Y - Guardian  MOST FORM COMPLETE: N HOSPICE EDUCATION PROVIDED: N  PPS: Patient is ambulatory w/o AD. Patient is able to toilet self. Patient can feed self. Patients refuses bathes and oral care at times due to dementia.   Time spent: 40 min       [place] Henrene Pastor, [place]

## 2020-04-22 NOTE — Telephone Encounter (Signed)
Palliative care SW outreach to patients daughter, [name]. LVM awaiting return call.

## 2020-04-25 ENCOUNTER — Telehealth: Payer: Self-pay

## 2020-04-25 NOTE — Telephone Encounter (Signed)
3:27PM.  Palliative care SW left HIPPA complaint VM for patients daughter [name]. Awaiting return call.

## 2020-04-29 ENCOUNTER — Telehealth: Payer: Self-pay

## 2020-04-29 ENCOUNTER — Other Ambulatory Visit: Payer: Self-pay

## 2020-04-29 ENCOUNTER — Encounter: Payer: Self-pay | Admitting: Family

## 2020-04-29 ENCOUNTER — Ambulatory Visit (INDEPENDENT_AMBULATORY_CARE_PROVIDER_SITE_OTHER): Payer: Medicare Other | Admitting: Family

## 2020-04-29 VITALS — BP 122/82 | HR 69 | Temp 97.8°F | Ht 66.0 in | Wt 132.2 lb

## 2020-04-29 DIAGNOSIS — E785 Hyperlipidemia, unspecified: Secondary | ICD-10-CM

## 2020-04-29 DIAGNOSIS — H6123 Impacted cerumen, bilateral: Secondary | ICD-10-CM | POA: Diagnosis not present

## 2020-04-29 DIAGNOSIS — M8949 Other hypertrophic osteoarthropathy, multiple sites: Secondary | ICD-10-CM | POA: Diagnosis not present

## 2020-04-29 DIAGNOSIS — F0391 Unspecified dementia with behavioral disturbance: Secondary | ICD-10-CM | POA: Diagnosis not present

## 2020-04-29 DIAGNOSIS — I10 Essential (primary) hypertension: Secondary | ICD-10-CM | POA: Diagnosis not present

## 2020-04-29 DIAGNOSIS — Z23 Encounter for immunization: Secondary | ICD-10-CM | POA: Diagnosis not present

## 2020-04-29 DIAGNOSIS — F411 Generalized anxiety disorder: Secondary | ICD-10-CM | POA: Diagnosis not present

## 2020-04-29 DIAGNOSIS — M159 Polyosteoarthritis, unspecified: Secondary | ICD-10-CM

## 2020-04-29 MED ORDER — SERTRALINE HCL 50 MG PO TABS: 50.0000 mg | ORAL_TABLET | Freq: Every day | ORAL | 3 refills | Status: DC

## 2020-04-29 NOTE — Telephone Encounter (Signed)
12:05PM: Palliative care SW left VM for patients daughter, [name]. Awaiting return call.

## 2020-04-29 NOTE — Progress Notes (Signed)
Provider: Marlowe Sax FNP-C  Wendi Lastra, Nelda Bucks, NP  Patient Care Team: Piero Mustard, Nelda Bucks, NP as PCP - General (Family Medicine) Whitney Muse, Kelby Fam, MD (Inactive) as Consulting Physician (Hematology and Oncology) Rutherford Guys, MD as Consulting Physician (Ophthalmology) Delice Lesch Lezlie Octave, MD as Consulting Physician (Neurology)  Extended Emergency Contact Information Primary Emergency Contact: Mikey College Address: [address]          [place], [place] [address] Johnnette Litter of [place] Phone: [phone] Mobile Phone: [phone] Relation: Daughter Secondary Emergency Contact: [place] Phone: [phone] Mobile Phone: [phone] Relation: Daughter  Code Status:  Full Code  Goals of care: Advanced Directive information Advanced Directives 04/29/2020  Does Patient Have a Medical Advance Directive? Yes  Type of Advance Directive [place]  Does patient want to make changes to medical advance directive? No - Patient declined  Copy of [place] in Chart? -  Would patient like information on creating a medical advance directive? -     Chief Complaint  Patient presents with  . Acute Visit    discuss labs, wants to put her mother in a assisted living home, may have sun downers, dementia is worse,  very aggressive with words. Need a referral for shower chair     HPI:  Pt is a 86 y.o. female seen today for an acute visit for evaluation of discuss labs. wants to put her mother in a assisted living home.states may have sun downing. dementia is worse.Daughter states very aggressive with words.   Need a referral for shower chair to prevent patient from falling in the shower.States patient continues to perform own ADL's does not allow daughter to assist. Her hair is matted does not wash it or allow to be combed. Daughter states struggling with her own depression and health sometimes does not get up early.It's a struggle to get out  of the house with patient so they get out of the house 1-2 weeks.   After 5:30 pm it's hard to get her to eat. Continue to be combative since 04/21/2020.connects to the TV.Asks whether she is in her house.recently went to daughter's room saying two men were in her room. Social worker 03/23/2020 and 04/05/2020 evaluated with the Nurse.she had another visit 04/22/2020.  Palliative care has been following up once a month. Hgb 11.5,Cholesterol 217, TRG 77,LDL 130  Labs reviewed and discussed today.  Patient calm during visit folding Kline[employer] and throwing it in the trash.   Past Medical History:  Diagnosis Date  . Allergic rhinitis, cause unspecified   . Angioneurotic edema not elsewhere classified   . Anxiety disorder   . Asthma   . Carotid bruit   . Carpal tunnel syndrome   . Chronic abdominal pain   . Fecal incontinence   . GERD (gastroesophageal reflux disease)   . Hyperlipidemia   . IBS (irritable bowel syndrome)   . Insomnia   . Lung nodule   . Osteoarthritis   . Palpitations   . Pancytopenia ([place])   . Senile osteoporosis    Past Surgical History:  Procedure Laterality Date  . ABDOMINAL HYSTERECTOMY  1979   fibroids   . bilateral cataract surgery  2008  . BIOPSY THYROID  1970   . COLONOSCOPY  12/21/04   normal -redundant   . EUS  08/07/07  . EYE SURGERY      Allergies  Allergen Reactions  . Diphenhydramine Hcl Other (See Comments)    unknown  . Rivastigmine Tartrate Palpitations  Outpatient Encounter Medications as of 04/29/2020  Medication Sig  . donepezil (ARICEPT) 10 MG tablet Take 1 tablet (10 mg total) by mouth at bedtime.  . fexofenadine (ALLEGRA) 180 MG tablet Take 180 mg by mouth daily.  . fluticasone (FLONASE) 50 MCG/ACT nasal spray Place 1 spray into both nostrils daily.  . Melatonin 10 MG TABS Take 5 mg by mouth daily.   . metoprolol succinate (TOPROL-XL) 25 MG 24 hr tablet Take one tablet by mouth once daily.  . sertraline (ZOLOFT) 25 MG tablet Take 1  tablet by mouth once daily  . vitamin C (ASCORBIC ACID) 500 MG tablet Take 500 mg by mouth daily.   No facility-administered encounter medications on file as of 04/29/2020.    Review of Systems  Constitutional: Negative for appetite change, chills, fatigue and fever.  HENT: Negative for congestion.   Eyes: Positive for visual disturbance. Negative for discharge, redness and itching.  Respiratory: Negative for cough, chest tightness, shortness of breath and wheezing.   Cardiovascular: Negative for chest pain, palpitations and leg swelling.  Gastrointestinal: Negative for abdominal distention, abdominal pain, constipation, diarrhea, nausea and vomiting.  Endocrine: Negative for cold intolerance, heat intolerance, polydipsia, polyphagia and polyuria.  Genitourinary: Negative for difficulty urinating, dysuria, flank pain, frequency and urgency.  Musculoskeletal: Positive for arthralgias. Negative for joint swelling and myalgias.  Skin: Negative for color change, pallor and rash.    Immunization History  Administered Date(s) Administered  . Fluad Quad(high Dose 65+) 03/25/2019, 04/29/2020  . Influenza Split 03/27/2011, 04/09/2012  . Influenza Whole 03/31/2007, 03/29/2009, 04/12/2009, 03/14/2010  . Influenza, High Dose Seasonal PF 04/30/2018  . Influenza,inj,Quad PF,6+ Mos 03/31/2013, 04/07/2014, 03/08/2015, 02/23/2016, 06/06/2016, 06/12/2017  . Moderna SARS-COVID-2 Vaccination 09/12/2019, 10/14/2019  . PPD Test 06/12/2017, 12/22/2018  . Pneumococcal Conjugate-13 08/05/2014  . Pneumococcal Polysaccharide-23 02/11/2006  . Td 04/18/2005   Pertinent  Health Maintenance Due  Topic Date Due  . INFLUENZA VACCINE  Completed  . DEXA SCAN  Completed  . PNA vac Low Risk Adult  Completed   Fall Risk  04/29/2020 04/08/2020 10/13/2019 09/14/2019 08/05/2019  Falls in the past year? - 1 1 0 0  Number falls in past yr: 1 0 0 0 0  Injury with Fall? 0 0 0 0 0  Risk for fall due to : - - - - -    Functional Status Survey:    Vitals:   04/29/20 1557  BP: 122/82  Pulse: 69  Temp: 97.8 F (36.6 C)  TempSrc: Temporal  SpO2: 96%  Weight: 132 lb 3.2 oz (60 kg)  Height: '5\' 6"'  (1.676 m)   Body mass index is 21.34 kg/m. Physical Exam Vitals reviewed.  Constitutional:      General: She is not in acute distress.    Appearance: She is normal weight. She is not ill-appearing.     Comments: Unkept   HENT:     Head: Normocephalic.     Right Ear: There is impacted cerumen.     Left Ear: There is impacted cerumen.     Nose: Nose normal. No congestion or rhinorrhea.     Mouth/Throat:     Mouth: Mucous membranes are moist.     Pharynx: Oropharynx is clear. No oropharyngeal exudate or posterior oropharyngeal erythema.  Eyes:     General: No scleral icterus.       Right eye: No discharge.        Left eye: No discharge.     Extraocular Movements: Extraocular movements intact.  Conjunctiva/sclera: Conjunctivae normal.     Pupils: Pupils are equal, round, and reactive to light.  Neck:     Vascular: No carotid bruit.  Cardiovascular:     Rate and Rhythm: Normal rate and regular rhythm.     Pulses: Normal pulses.     Heart sounds: Normal heart sounds. No murmur heard.  No friction rub. No gallop.   Pulmonary:     Effort: Pulmonary effort is normal. No respiratory distress.     Breath sounds: Normal breath sounds. No wheezing, rhonchi or rales.  Chest:     Chest wall: No tenderness.  Abdominal:     General: Bowel sounds are normal. There is no distension.     Palpations: Abdomen is soft. There is no mass.     Tenderness: There is no abdominal tenderness. There is no right CVA tenderness, left CVA tenderness, guarding or rebound.  Musculoskeletal:        General: No swelling or tenderness. Normal range of motion.     Cervical back: Normal range of motion. No rigidity or tenderness.     Right lower leg: No edema.     Left lower leg: No edema.  Lymphadenopathy:      Cervical: No cervical adenopathy.  Skin:    General: Skin is warm and dry.     Coloration: Skin is not pale.     Findings: No bruising, erythema or rash.     Comments: Matted hair.blouse turned inside out. Bilateral lower extremities very dry scaly skin.dry flaky skin when socks removed spilled to the floor.   Neurological:     Mental Status: She is alert. Mental status is at baseline.     Cranial Nerves: No cranial nerve deficit.     Sensory: No sensory deficit.     Motor: No weakness.     Coordination: Coordination normal.     Gait: Gait normal.  Psychiatric:        Mood and Affect: Mood normal.        Speech: Speech normal.        Behavior: Behavior normal.        Thought Content: Thought content normal.        Cognition and Memory: Memory is impaired. She exhibits impaired recent memory.     Comments: Argues with daughter sometimes during visit but answers provider appropriately and follows direction.     Labs reviewed: Recent Labs    04/08/20 1543  NA 137  K 4.1  CL 102  CO2 27  GLUCOSE 92  BUN 14  CREATININE 0.98*  CALCIUM 9.7   Recent Labs    04/08/20 1543  AST 16  ALT 7  BILITOT 0.6  PROT 7.9   Recent Labs    04/08/20 1543  WBC 4.9  NEUTROABS 2,656  HGB 11.5*  HCT 34.0*  MCV 95.8  PLT 143   Lab Results  Component Value Date   TSH 3.44 04/08/2020   No results found for: HGBA1C Lab Results  Component Value Date   CHOL 217 (H) 04/08/2020   HDL 70 04/08/2020   LDLCALC 130 (H) 04/08/2020   TRIG 77 04/08/2020   CHOLHDL 3.1 04/08/2020    Significant Diagnostic Results in last 30 days:  No results found.  Assessment/Plan 1. Need for influenza vaccination Afebrile.Asymptomatic. Influenza vaccine administered by CMA today no reaction noted.                               -  Flu Vaccine QUAD High Dose(Fluad)  2. Essential hypertension B/p well controlled. - continue on metoprolol 25 mg tablet daily   - CBC with Differential/Platelet; Future -  CMP with eGFR([facility]); Future - TSH; Future  3. Generalized anxiety disorder Stable.continue on sertraline 50 mg tablet daily.   4. Dementia with behavioral disturbance, unspecified dementia type ([place]) At baseline.worsening per daughter though no change noted by provider cooperative during visit.  Continue on sertraline as above. - Requires assistance with ADL's  - For home use only DME Other see comment shower chair  - Recommend high level of care ALF  - continue on Palliative care  5. Primary osteoarthritis involving multiple joints Advised to take tylenol every 6 hrs as  Needed. - For home use only DME Other see comment shower chair order faxed to [facility]   6. Bilateral impacted cerumen Unable to lavage during visit.will also need hearing evaluation.will refer to ENT for further evaluation.  - Ambulatory referral to ENT  7. Hyperlipidemia LDL goal <100 LDL not at goal. - Lipid panel; Future   Family/ staff Communication: Reviewed plan of care with patient and daughter.Daughter repeating self and describing patient's symptoms of dementia over and over during the visit though patient has had symptoms of previous visit.Dementia information resource book provided in previous visit.   Labs/tests ordered:  - CBC with Differential/Platelet; Future - CMP with eGFR([facility]); Future - TSH; Future - Lipid panel; Future   Next Appointment: 6 months for medical management of chronic issues.   [name], NP

## 2020-05-03 ENCOUNTER — Telehealth: Payer: Self-pay

## 2020-05-03 NOTE — Telephone Encounter (Signed)
9:45AM: Palliative care SW returning call for daughter, [name]. HIPPA compliant vm left.

## 2020-05-05 ENCOUNTER — Telehealth: Payer: Self-pay

## 2020-05-05 NOTE — Telephone Encounter (Signed)
3:00pm: Palliative care SW spoke with  Patients daughter, Kathleen Gallegos, who shared that patient had been exhibiting some aggressive behaviors ans he was not quit sure what to do. SW advised daughter to rule out pain as the cause of the behavior, limit distractions from the patient, try to keep a calm environment and a set routine and take a break, If the patient is in a safe environment and you are able, walk away and take a moment for yourself. Otherwise if patient is too irate and unable to calm down daughter should call 911.Daughter shared she does not think patient is at that point, but thanked SW for the suggestions and recommendations.

## 2020-05-07 ENCOUNTER — Other Ambulatory Visit: Payer: Self-pay | Admitting: Family

## 2020-05-07 DIAGNOSIS — F0391 Unspecified dementia with behavioral disturbance: Secondary | ICD-10-CM

## 2020-05-07 DIAGNOSIS — F411 Generalized anxiety disorder: Secondary | ICD-10-CM

## 2020-05-12 ENCOUNTER — Encounter (INDEPENDENT_AMBULATORY_CARE_PROVIDER_SITE_OTHER): Payer: Self-pay | Admitting: Otolaryngology

## 2020-05-12 ENCOUNTER — Other Ambulatory Visit: Payer: Self-pay | Admitting: *Deleted

## 2020-05-12 ENCOUNTER — Ambulatory Visit (INDEPENDENT_AMBULATORY_CARE_PROVIDER_SITE_OTHER): Payer: Medicare Other | Admitting: Otolaryngology

## 2020-05-12 ENCOUNTER — Other Ambulatory Visit: Payer: Self-pay

## 2020-05-12 VITALS — Temp 97.5°F

## 2020-05-12 DIAGNOSIS — H903 Sensorineural hearing loss, bilateral: Secondary | ICD-10-CM

## 2020-05-12 DIAGNOSIS — H6123 Impacted cerumen, bilateral: Secondary | ICD-10-CM

## 2020-05-12 DIAGNOSIS — F411 Generalized anxiety disorder: Secondary | ICD-10-CM

## 2020-05-12 DIAGNOSIS — F0391 Unspecified dementia with behavioral disturbance: Secondary | ICD-10-CM

## 2020-05-12 MED ORDER — SERTRALINE HCL 50 MG PO TABS: 50.0000 mg | ORAL_TABLET | Freq: Every day | ORAL | 3 refills | Status: DC

## 2020-05-12 NOTE — Progress Notes (Signed)
HPI: Kathleen Gallegos is a 86 y.o. female who presents is referred by her PCP for evaluation of wax buildup in her ears.  She presents today with her daughter.  She has no specific complaints..  Her daughter reports that she has dementia.  Past Medical History:  Diagnosis Date  . Allergic rhinitis, cause unspecified   . Angioneurotic edema not elsewhere classified   . Anxiety disorder   . Asthma   . Carotid bruit   . Carpal tunnel syndrome   . Chronic abdominal pain   . Fecal incontinence   . GERD (gastroesophageal reflux disease)   . Hyperlipidemia   . IBS (irritable bowel syndrome)   . Insomnia   . Lung nodule   . Osteoarthritis   . Palpitations   . Pancytopenia ([place])   . Senile osteoporosis    Past Surgical History:  Procedure Laterality Date  . ABDOMINAL HYSTERECTOMY  1979   fibroids   . bilateral cataract surgery  2008  . BIOPSY THYROID  1970   . COLONOSCOPY  12/21/04   normal -redundant   . EUS  08/07/07  . EYE SURGERY     Social History   Socioeconomic History  . Marital status: Widowed    Spouse name: Not on file  . Number of children: 3  . Years of education: Not on file  . Highest education level: Not on file  Occupational History  . Occupation: retired    Fish farm manager: RETIRED  Tobacco Use  . Smoking status: Never Smoker  . Smokeless tobacco: Former Systems developer    Types: Snuff  Vaping Use  . Vaping Use: Never used  Substance and Sexual Activity  . Alcohol use: No  . Drug use: Not Currently  . Sexual activity: Never  Other Topics Concern  . Not on file  Social History Narrative   Social History      Diet? Regular 3 x day       Do you drink/eat things with caffeine? Yes Sometimes mostly water      Marital status?         widowed                           What year were you married? About 1951      Do you live in a house, apartment, assisted living, condo, trailer, etc.?  House       Is it one or more stories?      How many persons live in your home?  2      Do you have any pets in your home? (please list)  No pets       Highest level of education completed? High school - [employer]       Current or past profession: retired       Do you exercise?     No                                  Type & how often? Does not get out now      Advanced Directives      Do you have a living will?  No       Do you have a DNR form?        No  If not, do you want to discuss one? yes      Do you have signed POA/HPOA for forms? No       Functional Status      Do you have difficulty bathing or dressing yourself? Yes         Do you have difficulty preparing food or eating? no      Do you have difficulty managing your medications? No       Do you have difficulty managing your finances? No       Do you have difficulty affording your medications? No    Social Determinants of Health   Financial Resource Strain:   . Difficulty of Paying Living Expenses: Not on file  Food Insecurity:   . Worried About Charity fundraiser in the Last Year: Not on file  . Ran Out of Food in the Last Year: Not on file  Transportation Needs:   . Lack of Transportation (Medical): Not on file  . Lack of Transportation (Non-Medical): Not on file  Physical Activity:   . Days of Exercise per Week: Not on file  . Minutes of Exercise per Session: Not on file  Stress:   . Feeling of Stress : Not on file  Social Connections:   . Frequency of Communication with Friends and Family: Not on file  . Frequency of Social Gatherings with Friends and Family: Not on file  . Attends Religious Services: Not on file  . Active Member of Clubs or Organizations: Not on file  . Attends Archivist Meetings: Not on file  . Marital Status: Not on file   Family History  Problem Relation Age of Onset  . Hypertension Mother   . Pancreatic cancer Father   . Cancer Brother   . Depression Daughter   . Heart disease Sister   . Leukemia Other         family history   . Colon cancer Other        family history    Allergies  Allergen Reactions  . Diphenhydramine Hcl Other (See Comments)    unknown  . Rivastigmine Tartrate Palpitations   Prior to Admission medications   Medication Sig Start Date End Date Taking? Authorizing Provider  donepezil (ARICEPT) 10 MG tablet Take 1 tablet (10 mg total) by mouth at bedtime. 12/22/19  Yes Ngetich, [name] C, NP  fexofenadine (ALLEGRA) 180 MG tablet Take 180 mg by mouth daily.   Yes [provider]  fluticasone (FLONASE) 50 MCG/ACT nasal spray Place 1 spray into both nostrils daily.   Yes [provider]  Melatonin 10 MG TABS Take 5 mg by mouth daily.    Yes [provider]  metoprolol succinate (TOPROL-XL) 25 MG 24 hr tablet Take one tablet by mouth once daily. 12/11/19  Yes Ngetich, [name] C, NP  sertraline (ZOLOFT) 50 MG tablet Take 1 tablet (50 mg total) by mouth daily. 05/12/20  Yes Ngetich, [name] C, NP  vitamin C (ASCORBIC ACID) 500 MG tablet Take 500 mg by mouth daily.   Yes [provider]     Positive ROS: Otherwise negative  All other systems have been reviewed and were otherwise negative with the exception of those mentioned in the HPI and as above.  Physical Exam: Constitutional: Alert, well-appearing, no acute distress Ears: External ears without lesions or tenderness.  She has moderately small ear canals bilaterally.  Ear canals were cleaned with a curette and forceps.  TMs were clear bilaterally.  On hearing screening with a 1024 tuning fork she has moderate bilateral sensorineural hearing loss. Nasal: External nose without lesions.. Clear nasal passages Oral: Lips and gums without lesions. Tongue and palate mucosa without lesions. Posterior oropharynx clear. Neck: No palpable adenopathy or masses Respiratory: Breathing comfortably  Skin: No facial/neck lesions or rash noted.  Cerumen impaction removal  Date/Time: 05/12/2020 3:56 PM Performed by:  Rozetta Nunnery, MD Authorized by: Rozetta Nunnery, MD   Consent:    Consent obtained:  Verbal   Consent given by:  Patient   Risks discussed:  Pain and bleeding Procedure details:    Location:  L ear and R ear   Procedure type: curette and forceps   Post-procedure details:    Inspection:  TM intact and canal normal   Hearing quality:  Improved   Patient tolerance of procedure:  Tolerated well, no immediate complications Comments:     TMs are clear bilaterally    Assessment: Moderate wax buildup in both ear canals. Underlying bilateral sensorineural hearing loss.  Plan: Briefly discussed with her daughter that she would probably be a candidate for hearing aids if she has difficulty with her hearing would recommend follow-up with audiologist concerning hearing test and possibly obtaining hearing aids.   Radene Journey, MD   CC:

## 2020-05-12 NOTE — Telephone Encounter (Signed)
Patient daughter, [name] called and stated that pharmacy did not receive the Rx [name] sent to the pharmacy for Sertraline 10/22.  Refaxed Rx.

## 2020-05-16 ENCOUNTER — Emergency Department (HOSPITAL_COMMUNITY): Payer: Medicare Other

## 2020-05-16 ENCOUNTER — Emergency Department (HOSPITAL_COMMUNITY)
Admission: EM | Admit: 2020-05-16 | Discharge: 2020-05-16 | Disposition: A | Discharge status: Home / Self Care | Payer: Medicare Other | Attending: Emergency Medicine | Admitting: Emergency Medicine

## 2020-05-16 ENCOUNTER — Telehealth: Payer: Self-pay | Admitting: *Deleted

## 2020-05-16 ENCOUNTER — Other Ambulatory Visit: Payer: Self-pay

## 2020-05-16 ENCOUNTER — Encounter (HOSPITAL_COMMUNITY): Payer: Self-pay

## 2020-05-16 DIAGNOSIS — F0391 Unspecified dementia with behavioral disturbance: Secondary | ICD-10-CM

## 2020-05-16 DIAGNOSIS — R4182 Altered mental status, unspecified: Secondary | ICD-10-CM | POA: Insufficient documentation

## 2020-05-16 DIAGNOSIS — I6782 Cerebral ischemia: Secondary | ICD-10-CM | POA: Diagnosis not present

## 2020-05-16 DIAGNOSIS — Z79899 Other long term (current) drug therapy: Secondary | ICD-10-CM | POA: Diagnosis not present

## 2020-05-16 DIAGNOSIS — F0390 Unspecified dementia without behavioral disturbance: Secondary | ICD-10-CM | POA: Diagnosis not present

## 2020-05-16 DIAGNOSIS — Z87891 Personal history of nicotine dependence: Secondary | ICD-10-CM | POA: Diagnosis not present

## 2020-05-16 DIAGNOSIS — J45909 Unspecified asthma, uncomplicated: Secondary | ICD-10-CM | POA: Diagnosis not present

## 2020-05-16 DIAGNOSIS — R456 Violent behavior: Secondary | ICD-10-CM | POA: Diagnosis not present

## 2020-05-16 DIAGNOSIS — R9082 White matter disease, unspecified: Secondary | ICD-10-CM | POA: Diagnosis not present

## 2020-05-16 DIAGNOSIS — I6389 Other cerebral infarction: Secondary | ICD-10-CM | POA: Diagnosis not present

## 2020-05-16 LAB — COMPREHENSIVE METABOLIC PANEL
ALT: 14 U/L (ref 0–44)
AST: 20 U/L (ref 15–41)
Albumin: 3.8 g/dL (ref 3.5–5.0)
Alkaline Phosphatase: 35 U/L — ABNORMAL LOW (ref 38–126)
Anion gap: 9 (ref 5–15)
BUN: 20 mg/dL (ref 8–23)
CO2: 25 mmol/L (ref 22–32)
Calcium: 9.2 mg/dL (ref 8.9–10.3)
Chloride: 102 mmol/L (ref 98–111)
Creatinine, Ser: 0.85 mg/dL (ref 0.44–1.00)
GFR, Estimated: >60 mL/min (ref >60)
Glucose, Bld: 96 mg/dL (ref 70–99)
Potassium: 3.9 mmol/L (ref 3.5–5.1)
Sodium: 136 mmol/L (ref 135–145)
Total Bilirubin: 0.6 mg/dL (ref 0.3–1.2)
Total Protein: 7.9 g/dL (ref 6.5–8.1)

## 2020-05-16 LAB — CBC WITH DIFFERENTIAL/PLATELET
Abs Immature Granulocytes: 0.01 10*3/uL (ref 0.00–0.07)
Basophils Absolute: 0 10*3/uL (ref 0.0–0.1)
Basophils Relative: 0 %
Eosinophils Absolute: 0.1 10*3/uL (ref 0.0–0.5)
Eosinophils Relative: 2 %
HCT: 34.2 % — ABNORMAL LOW (ref 36.0–46.0)
Hemoglobin: 11.1 g/dL — ABNORMAL LOW (ref 12.0–15.0)
Immature Granulocytes: 0 %
Lymphocytes Relative: 32 %
Lymphs Abs: 1.6 10*3/uL (ref 0.7–4.0)
MCH: 32.6 pg (ref 26.0–34.0)
MCHC: 32.5 g/dL (ref 30.0–36.0)
MCV: 100.6 fL — ABNORMAL HIGH (ref 80.0–100.0)
Monocytes Absolute: 0.6 10*3/uL (ref 0.1–1.0)
Monocytes Relative: 13 %
Neutro Abs: 2.6 10*3/uL (ref 1.7–7.7)
Neutrophils Relative %: 53 %
Platelets: 130 10*3/uL — ABNORMAL LOW (ref 150–400)
RBC: 3.4 MIL/uL — ABNORMAL LOW (ref 3.87–5.11)
RDW: 12.5 % (ref 11.5–15.5)
WBC: 5 10*3/uL (ref 4.0–10.5)
nRBC: 0 % (ref 0.0–0.2)

## 2020-05-16 LAB — URINALYSIS, ROUTINE W REFLEX MICROSCOPIC
Bacteria, UA: NONE SEEN
Bilirubin Urine: NEGATIVE
Glucose, UA: NEGATIVE mg/dL
Hgb urine dipstick: NEGATIVE
Ketones, ur: NEGATIVE mg/dL
Nitrite: NEGATIVE
Protein, ur: NEGATIVE mg/dL
Specific Gravity, Urine: 1.023 (ref 1.005–1.030)
pH: 5 (ref 5.0–8.0)

## 2020-05-16 MED ORDER — CEPHALEXIN 250 MG PO CAPS: 250.0000 mg | ORAL_CAPSULE | Freq: Four times a day (QID) | ORAL | 0 refills | Status: DC

## 2020-05-16 MED ORDER — CEPHALEXIN 250 MG PO CAPS
250.0000 mg | ORAL_CAPSULE | Freq: Once | ORAL | Status: AC
  Administered 2020-05-16: 250 mg via ORAL
  Filled 2020-05-16 (×2): qty 1

## 2020-05-16 NOTE — ED Notes (Signed)
Pt attempted to leave ER.  Pt brought back to room 6.  Pt will be moved to hall 7 following completion of dinner.

## 2020-05-16 NOTE — Telephone Encounter (Signed)
Send her to ED for evaluation of combative behavior.

## 2020-05-16 NOTE — Telephone Encounter (Signed)
Daughter notified and agreed.  

## 2020-05-16 NOTE — Discharge Instructions (Signed)
We are treating her for a possible urinary tract infection, since the urinalysis was abnormal.  This could explain her some of her problems with combativeness and confusion.  It is important to follow-up with her primary care doctor.  Let them know that we did a urine culture, which will help to guide treatment.  Start the antibiotic prescription by tomorrow morning.

## 2020-05-16 NOTE — ED Provider Notes (Signed)
[facility] EMERGENCY DEPARTMENT Provider Note   CSN: [id number] Arrival date & time: 05/16/20  1438     History Chief Complaint  Patient presents with  . Altered Mental Status    Kathleen Gallegos is a 86 y.o. female.  HPI Patient seen by me at 3:15 PM.  Her daughter arrived about an hour and half later.  Her daughter states that the patient has been more combative than usual, and is gradually getting worse, with confusion.  Her daughter is working with the [place] hospital to obtain some in-home care and treatment, by home health.  His daughter has noted confusion including not wearing clothing properly, urinating at unusual times, being hard to get along with an irritable, and argumentative.  There have been no other recent illnesses including fever, vomiting, dizziness, or falling.  The patient is unable to give any history.  All history is from the daughter.    Past Medical History:  Diagnosis Date  . Allergic rhinitis, cause unspecified   . Angioneurotic edema not elsewhere classified   . Anxiety disorder   . Asthma   . Carotid bruit   . Carpal tunnel syndrome   . Chronic abdominal pain   . Fecal incontinence   . GERD (gastroesophageal reflux disease)   . Hyperlipidemia   . IBS (irritable bowel syndrome)   . Insomnia   . Lung nodule   . Osteoarthritis   . Palpitations   . Pancytopenia ([place])   . Senile osteoporosis     Patient Active Problem List   Diagnosis Date Noted  . Unspecified symptoms and signs involving cognitive functions and awareness 12/24/2016  . Vitamin D deficiency 03/12/2015  . Dementia ([place]) 01/04/2015  . Osteoporosis 08/05/2014  . Medicare annual wellness visit, subsequent 04/07/2014  . Cough variant asthma 07/28/2013  . Dry skin dermatitis 02/15/2013  . HEMORRHOIDS, WITH BLEEDING 07/27/2009  . TRANSIENT ISCHEMIC ATTACK 03/29/2009  . PALPITATIONS 03/25/2009  . IRRITABLE BOWEL SYNDROME 10/05/2008  . FECAL INCONTINENCE 10/05/2008  . GERD  05/24/2008  . Hyperlipemia 11/10/2007  . Osteoarthritis 11/10/2007  . Allergic rhinitis due to pollen 09/18/2007  . LUNG NODULE 09/18/2007    Past Surgical History:  Procedure Laterality Date  . ABDOMINAL HYSTERECTOMY  1979   fibroids   . bilateral cataract surgery  2008  . BIOPSY THYROID  1970   . COLONOSCOPY  12/21/04   normal -redundant   . EUS  08/07/07  . EYE SURGERY       OB History   No obstetric history on file.     Family History  Problem Relation Age of Onset  . Hypertension Mother   . Pancreatic cancer Father   . Cancer Brother   . Depression Daughter   . Heart disease Sister   . Leukemia Other        family history   . Colon cancer Other        family history     Social History   Tobacco Use  . Smoking status: Never Smoker  . Smokeless tobacco: Former Systems developer    Types: Snuff  Vaping Use  . Vaping Use: Never used  Substance Use Topics  . Alcohol use: No  . Drug use: Not Currently    Home Medications Prior to Admission medications   Medication Sig Start Date End Date Taking? Authorizing Provider  donepezil (ARICEPT) 10 MG tablet Take 1 tablet (10 mg total) by mouth at bedtime. 12/22/19   Ngetich, Nelda Bucks, NP  fexofenadine (  ALLEGRA) 180 MG tablet Take 180 mg by mouth daily.    [provider]  fluticasone (FLONASE) 50 MCG/ACT nasal spray Place 1 spray into both nostrils daily.    [provider]  Melatonin 10 MG TABS Take 5 mg by mouth daily.     [provider]  metoprolol succinate (TOPROL-XL) 25 MG 24 hr tablet Take one tablet by mouth once daily. 12/11/19   Ngetich, [name] C, NP  sertraline (ZOLOFT) 50 MG tablet Take 1 tablet (50 mg total) by mouth daily. 05/12/20   Ngetich, [name] C, NP  vitamin C (ASCORBIC ACID) 500 MG tablet Take 500 mg by mouth daily.    [provider]    Allergies    Diphenhydramine hcl and Rivastigmine tartrate  Review of Systems   Review of Systems  All other systems reviewed and are  negative.   Physical Exam Updated Vital Signs BP 122/84 (BP Location: Right Arm)   Pulse 68   Temp 98.7 F (37.1 C) (Oral)   Resp 16   Ht 5\' 6"  (1.676 m)   Wt 60 kg   SpO2 99%   BMI 21.35 kg/m   Physical Exam Vitals and nursing note reviewed.  Constitutional:      General: She is not in acute distress.    Appearance: She is well-developed. She is not ill-appearing, toxic-appearing or diaphoretic.     Comments: Elderly, frail  HENT:     Head: Normocephalic and atraumatic.     Right Ear: External ear normal.     Left Ear: External ear normal.  Eyes:     Conjunctiva/sclera: Conjunctivae normal.     Pupils: Pupils are equal, round, and reactive to light.  Neck:     Trachea: Phonation normal.  Cardiovascular:     Rate and Rhythm: Normal rate and regular rhythm.     Heart sounds: Normal heart sounds.  Pulmonary:     Effort: Pulmonary effort is normal.     Breath sounds: Normal breath sounds.  Abdominal:     Palpations: Abdomen is soft.     Tenderness: There is no abdominal tenderness.  Musculoskeletal:        General: Normal range of motion.     Cervical back: Normal range of motion and neck supple.  Skin:    General: Skin is warm and dry.     Coloration: Skin is not jaundiced.  Neurological:     Mental Status: She is alert.     Cranial Nerves: No cranial nerve deficit.     Sensory: No sensory deficit.     Motor: No abnormal muscle tone.     Coordination: Coordination normal.     Comments: No dysarthria or aphasia.  Communicative.  Psychiatric:        Mood and Affect: Mood normal.        Behavior: Behavior normal.     Comments: She communicates well but is confused     ED Results / Procedures / Treatments   Labs (all labs ordered are listed, but only abnormal results are displayed) Labs Reviewed  COMPREHENSIVE METABOLIC PANEL  CBC WITH DIFFERENTIAL/PLATELET  URINALYSIS, ROUTINE W REFLEX MICROSCOPIC    EKG None  Radiology No results  found.  Procedures Procedures (including critical care time)  Medications Ordered in ED Medications - No data to display  ED Course  I have reviewed the triage vital signs and the nursing notes.  Pertinent labs & imaging results that were available during my care of  the patient were reviewed by me and considered in my medical decision making (see chart for details).  Clinical Course as of May 17 1911  Mon May 16, 2020  1858 Normal except hemoglobin low, platelets low  CBC with Differential(!) [EW]  1858 Normal except alkaline phosphatase low  Comprehensive metabolic panel(!) [EW]  2355 Normal except leukocytes present, with 6-10 red and white cells.  Limited squamous epithelial cells.  Urine culture sent because the patient has worsening mental status.  Empiric antibiotic started.  Urinalysis, Routine w reflex microscopic Urine, Catheterized(!) [EW]  1911 Urinalysis, Routine w reflex microscopic Urine, Catheterized(!) [EW]    Clinical Course User Index [EW] Daleen Bo, MD   MDM Rules/Calculators/A&P                           Patient Vitals for the past 24 hrs:  BP Temp Temp src Pulse Resp SpO2 Height Weight  05/16/20 1900 (!) 143/88 -- -- (!) 58 16 96 % -- --  05/16/20 1645 -- -- -- (!) 59 -- 98 % -- --  05/16/20 1630 120/88 -- -- (!) 59 14 97 % -- --  05/16/20 1615 (!) 118/97 98 F (36.7 C) Oral 63 14 100 % -- --  05/16/20 1515 -- -- -- (!) 58 -- 97 % -- --  05/16/20 1500 101/73 -- -- (!) 58 16 100 % -- --  05/16/20 1452 -- -- -- -- -- -- 5\' 6"  (1.676 m) 60 kg  05/16/20 1451 122/84 98.7 F (37.1 C) Oral 68 16 99 % -- --  05/16/20 1450 -- -- -- -- -- 99 % -- --  05/16/20 1445 137/64 -- -- 76 -- (!) 85 % -- --    7:10 PM Reevaluation with update and discussion. After initial assessment and treatment, an updated evaluation reveals no change in status, findings discussed with patient's daughter, by telephone, and all questions were answered. Daleen Bo   Medical  Decision Making:  This patient is presenting for evaluation of Gradually worse confusion and combativeness, which does require a range of treatment options, and is a complaint that involves a high risk of morbidity and mortality. The differential diagnoses include acute CNS disorder, acute illness, infection, metabolic disorder, worsening dementia. I decided to review old records, and in summary elderly female previously diagnosed with dementia, with worsening symptoms gradually been acutely worse over the last several days with combativeness.  I obtained additional historical information from daughter at the bedside.  Clinical Laboratory Tests Ordered, included CBC, Metabolic panel and Urinalysis. Review indicates probable urinary tract infection, culture ordered. Radiologic Tests Ordered, included CT head.  I independently Visualized: Radiologic images, which show no acute abnormality   Critical Interventions-clinical evaluation, laboratory testing, CT imaging, observation reassessment  After These Interventions, the Patient was reevaluated and was found stable for discharge.  Suspect UTI, causing worsening symptoms.  Culture ordered because of this change in mental status.  Antibiotics initiated empirically.  Doubt pyelonephritis, worsening renal status or metabolic disorder.  CRITICAL CARE-no Performed by: Daleen Bo  Nursing Notes Reviewed/ Care Coordinated Applicable Imaging Reviewed Interpretation of Laboratory Data incorporated into ED treatment  The patient appears reasonably screened and/or stabilized for discharge and I doubt any other medical condition or other [facility] requiring further screening, evaluation, or treatment in the ED at this time prior to discharge.  Plan: Home Medications-continue usual; Home Treatments-increase oral fluids and food; return here if the recommended treatment, does not  improve the symptoms; Recommended follow up-PCP 1 week.     Final Clinical  Impression(s) / ED Diagnoses Final diagnoses:  None    Rx / DC Orders ED Discharge Orders    None       Daleen Bo, MD 05/16/20 [phone]

## 2020-05-16 NOTE — ED Notes (Signed)
[facility] contacted to obtain Keflex as ordered.

## 2020-05-16 NOTE — Telephone Encounter (Signed)
Patient daughter, [name] called requesting advise.   Stated that patient is Combative towards her. Stated that patient is sitting in her gown with no depends on. Stated she took it off and won't put them back on. Daughter cannot let her do this for her and patient safety.  Cannot get her to get up to put them back on or go to the bathroom.  Daughter has applied for an Aid to help and is waiting for approval.   Daughter stated that she is so tired of taking care of her due to her being so stubborn and would like to take care of her from a distance. She is concerned with her own health issues she is going through right now.   Wants your advise on what to do. How does she get her to put them on.   Please Advise.

## 2020-05-16 NOTE — ED Notes (Signed)
Patient discharged home to oldest daughter.  0 s/s acute distress.  Discharge instructions reviewed with daughter.  Daughter receptive and demonstrated knowledge via teachback method.  Discharged via wheelchair.

## 2020-05-16 NOTE — ED Triage Notes (Signed)
Family would like her reevaluated for dementia.  Pt pleasant at this time.  Alert and oriented to name.

## 2020-05-18 LAB — URINE CULTURE: Culture: NO GROWTH

## 2020-05-18 NOTE — Telephone Encounter (Signed)
na

## 2020-05-26 DIAGNOSIS — Z23 Encounter for immunization: Secondary | ICD-10-CM | POA: Diagnosis not present

## 2020-05-27 ENCOUNTER — Ambulatory Visit (INDEPENDENT_AMBULATORY_CARE_PROVIDER_SITE_OTHER): Payer: Medicare Other | Admitting: Family

## 2020-05-27 ENCOUNTER — Encounter: Payer: Self-pay | Admitting: Family

## 2020-05-27 ENCOUNTER — Other Ambulatory Visit: Payer: Self-pay

## 2020-05-27 VITALS — BP 138/72 | HR 83 | Temp 97.5°F | Resp 16 | Ht 66.0 in | Wt 135.8 lb

## 2020-05-27 DIAGNOSIS — R399 Unspecified symptoms and signs involving the genitourinary system: Secondary | ICD-10-CM

## 2020-05-27 DIAGNOSIS — F0391 Unspecified dementia with behavioral disturbance: Secondary | ICD-10-CM

## 2020-05-27 DIAGNOSIS — J301 Allergic rhinitis due to pollen: Secondary | ICD-10-CM

## 2020-05-27 DIAGNOSIS — I10 Essential (primary) hypertension: Secondary | ICD-10-CM | POA: Diagnosis not present

## 2020-05-27 LAB — POCT URINALYSIS DIPSTICK
Bilirubin, UA: NEGATIVE
Blood, UA: POSITIVE
Glucose, UA: NEGATIVE
Ketones, UA: NEGATIVE
Nitrite, UA: NEGATIVE
Protein, UA: POSITIVE — AB
Spec Grav, UA: >=1.03 — AB (ref 1.010–1.025)
Urobilinogen, UA: NEGATIVE E.U./dL — AB
pH, UA: 5 (ref 5.0–8.0)

## 2020-05-27 NOTE — Progress Notes (Signed)
Provider: Marlowe Sax FNP-C  Arlo Butt, Nelda Bucks, NP  Patient Care Team: Lucyann Romano, Nelda Bucks, NP as PCP - General (Family Medicine) Whitney Muse, Kelby Fam, MD (Inactive) as Consulting Physician (Hematology and Oncology) Rutherford Guys, MD as Consulting Physician (Ophthalmology) Delice Lesch Lezlie Octave, MD as Consulting Physician (Neurology)  Extended Emergency Contact Information Primary Emergency Contact: Mikey College Address: [address]          [place], [place] [address] Johnnette Litter of [place] Phone: [phone] Mobile Phone: [phone] Relation: Daughter Secondary Emergency Contact: [place] Phone: [phone] Mobile Phone: [phone] Relation: Daughter  Code Status:  DNR Goals of care: Advanced Directive information Advanced Directives 04/29/2020  Does Patient Have a Medical Advance Directive? Yes  Type of Advance Directive [place]  Does patient want to make changes to medical advance directive? No - Patient declined  Copy of [place] in Chart? -  Would patient like information on creating a medical advance directive? -     Chief Complaint  Patient presents with  . Hospitalization Follow-up    ER Follow Up from 05/16/2020    HPI:  Pt is a 86 y.o. female seen today for an acute visit for follow up ED visit 05/16/2020. Daughter reported patient ad been more combative than usual and with worsening confusion.Lab work done in ED showed Hgb 11.1,MCV 100.6,PLTS 130.she had urine analysis showed clear yellow urine,negative nitrites,with large leukocytes.Urine culture showed no growth.she was treated with Keflex 500 mg capsule every 4 times daily x 5 days.Head Ct scan done showed no abnormality.symptoms thought due to UTI.she was discharge home to follow up with PCP.  Daughter states still very talkative and combative sometimes.she has had no fever or chills.      Past Medical History:  Diagnosis Date  . Allergic  rhinitis, cause unspecified   . Angioneurotic edema not elsewhere classified   . Anxiety disorder   . Asthma   . Carotid bruit   . Carpal tunnel syndrome   . Chronic abdominal pain   . Fecal incontinence   . GERD (gastroesophageal reflux disease)   . Hyperlipidemia   . IBS (irritable bowel syndrome)   . Insomnia   . Lung nodule   . Osteoarthritis   . Palpitations   . Pancytopenia ([place])   . Senile osteoporosis    Past Surgical History:  Procedure Laterality Date  . ABDOMINAL HYSTERECTOMY  1979   fibroids   . bilateral cataract surgery  2008  . BIOPSY THYROID  1970   . COLONOSCOPY  12/21/04   normal -redundant   . EUS  08/07/07  . EYE SURGERY      Allergies  Allergen Reactions  . Diphenhydramine Hcl Other (See Comments)    unknown  . Rivastigmine Tartrate Palpitations    Outpatient Encounter Medications as of 05/27/2020  Medication Sig  . cephALEXin (KEFLEX) 250 MG capsule Take 1 capsule (250 mg total) by mouth 4 (four) times daily.  Marland Kitchen donepezil (ARICEPT) 10 MG tablet Take 1 tablet (10 mg total) by mouth at bedtime.  . fexofenadine (ALLEGRA) 180 MG tablet Take 180 mg by mouth daily.  . fluticasone (FLONASE) 50 MCG/ACT nasal spray Place 1 spray into both nostrils daily.  . Melatonin 10 MG TABS Take 5 mg by mouth daily.   . metoprolol succinate (TOPROL-XL) 25 MG 24 hr tablet Take one tablet by mouth once daily.  . sertraline (ZOLOFT) 50 MG tablet Take 1 tablet (50 mg total) by mouth daily.  Marland Kitchen  vitamin C (ASCORBIC ACID) 500 MG tablet Take 500 mg by mouth daily.   No facility-administered encounter medications on file as of 05/27/2020.    Review of Systems  Constitutional: Negative for appetite change, chills, fatigue and fever.  HENT: Negative for congestion, sinus pressure, sinus pain, sneezing and sore throat.   Eyes: Negative for discharge, itching and visual disturbance.  Respiratory: Negative for cough, chest tightness, shortness of breath and wheezing.    Cardiovascular: Negative for chest pain, palpitations and leg swelling.  Gastrointestinal: Negative for abdominal distention, abdominal pain, constipation, diarrhea, nausea and vomiting.  Endocrine: Negative for cold intolerance, heat intolerance, polydipsia, polyphagia and polyuria.  Genitourinary: Negative for difficulty urinating, dysuria, flank pain and urgency.  Musculoskeletal: Positive for gait problem. Negative for arthralgias, back pain, joint swelling and myalgias.  Skin: Negative for color change, pallor and rash.  Neurological: Negative for dizziness, speech difficulty, weakness, light-headedness, numbness and headaches.  Hematological: Does not bruise/bleed easily.  Psychiatric/Behavioral: Negative for agitation, behavioral problems, decreased concentration, sleep disturbance and suicidal ideas. The patient is not nervous/anxious.     Immunization History  Administered Date(s) Administered  . Fluad Quad(high Dose 65+) 03/25/2019, 04/29/2020  . Influenza Split 03/27/2011, 04/09/2012  . Influenza Whole 03/31/2007, 03/29/2009, 04/12/2009, 03/14/2010  . Influenza, High Dose Seasonal PF 04/30/2018  . Influenza,inj,Quad PF,6+ Mos 03/31/2013, 04/07/2014, 03/08/2015, 02/23/2016, 06/06/2016, 06/12/2017  . Moderna SARS-COVID-2 Vaccination 09/12/2019, 10/14/2019  . PPD Test 06/12/2017, 12/22/2018  . Pneumococcal Conjugate-13 08/05/2014  . Pneumococcal Polysaccharide-23 02/11/2006  . Td 04/18/2005   Pertinent  Health Maintenance Due  Topic Date Due  . INFLUENZA VACCINE  Completed  . DEXA SCAN  Completed  . PNA vac Low Risk Adult  Completed   Fall Risk  04/29/2020 04/08/2020 10/13/2019 09/14/2019 08/05/2019  Falls in the past year? - 1 1 0 0  Number falls in past yr: 1 0 0 0 0  Injury with Fall? 0 0 0 0 0  Risk for fall due to : - - - - -   Functional Status Survey:    Vitals:   05/27/20 1513  BP: 138/72  Pulse: 83  Resp: 16  Temp: (!) 97.5 F (36.4 C)  SpO2: 93%  Weight:  135 lb 12.8 oz (61.6 kg)  Height: _0  (1.676 m)   Body mass index is 21.92 kg/m. Physical Exam Vitals reviewed.  Constitutional:      General: She is not in acute distress.    Appearance: She is normal weight. She is not ill-appearing.  HENT:     Head: Normocephalic.  Eyes:     General: No scleral icterus.       Right eye: No discharge.        Left eye: No discharge.     Conjunctiva/sclera: Conjunctivae normal.     Pupils: Pupils are equal, round, and reactive to light.  Neck:     Vascular: No carotid bruit.  Cardiovascular:     Rate and Rhythm: Normal rate and regular rhythm.     Pulses: Normal pulses.     Heart sounds: Normal heart sounds. No murmur heard.  No friction rub. No gallop.   Pulmonary:     Effort: Pulmonary effort is normal. No respiratory distress.     Breath sounds: Normal breath sounds. No wheezing, rhonchi or rales.  Chest:     Chest wall: No tenderness.  Abdominal:     General: Bowel sounds are normal. There is no distension.     Palpations: Abdomen  is soft. There is no mass.     Tenderness: There is no abdominal tenderness. There is no right CVA tenderness, left CVA tenderness, guarding or rebound.  Musculoskeletal:        General: No swelling or tenderness.     Cervical back: Normal range of motion. No rigidity or tenderness.     Right lower leg: No edema.     Left lower leg: No edema.     Comments: Unsteady gait   Lymphadenopathy:     Cervical: No cervical adenopathy.  Skin:    General: Skin is warm and dry.     Coloration: Skin is not pale.     Findings: No bruising, erythema or rash.  Neurological:     Mental Status: She is alert. Mental status is at baseline.     Cranial Nerves: No cranial nerve deficit.     Motor: No weakness.     Coordination: Coordination normal.     Gait: Gait abnormal.  Psychiatric:        Mood and Affect: Mood normal.        Behavior: Behavior normal.        Thought Content: Thought content normal.         Judgment: Judgment normal.     Labs reviewed: Recent Labs    04/08/20 1543 05/16/20 1815  NA 137 136  K 4.1 3.9  CL 102 102  CO2 27 25  GLUCOSE 92 96  BUN 14 20  CREATININE 0.98* 0.85  CALCIUM 9.7 9.2   Recent Labs    04/08/20 1543 05/16/20 1815  AST 16 20  ALT 7 14  ALKPHOS  --  35*  BILITOT 0.6 0.6  PROT 7.9 7.9  ALBUMIN  --  3.8   Recent Labs    04/08/20 1543 05/16/20 1815  WBC 4.9 5.0  NEUTROABS 2,656 2.6  HGB 11.5* 11.1*  HCT 34.0* 34.2*  MCV 95.8 100.6*  PLT 143 130*   Lab Results  Component Value Date   TSH 3.44 04/08/2020   No results found for: HGBA1C Lab Results  Component Value Date   CHOL 217 (H) 04/08/2020   HDL 70 04/08/2020   LDLCALC 130 (H) 04/08/2020   TRIG 77 04/08/2020   CHOLHDL 3.1 04/08/2020    Significant Diagnostic Results in last 30 days:  CT Head Wo Contrast  Result Date: 05/16/2020 CLINICAL DATA:  86 year old female is combative. Delirium. Possible dementia. EXAM: CT HEAD WITHOUT CONTRAST TECHNIQUE: Contiguous axial images were obtained from the base of the skull through the vertex without intravenous contrast. COMPARISON:  Brain MRI 03/31/2009.  Head CT 12/27/2012. FINDINGS: Brain: Cerebral volume loss since 2014 appears fairly generalized. However, there is suggestion of disproportionate biparietal atrophy such as on sagittal image 19. There is an unchanged chronic infarct in the left cerebellum PICA territory. Much smaller right SCA territory infarct is new since 2014 but appears chronic (series 2, image 11). Patchy chronic encephalomalacia also suspected in the right occipital pole and chronic but increased since 2014. Patchy bilateral cerebral white matter hypodensity is chronic but increased, including at the anterior limb of the left internal capsule which appears most affected. No midline shift, ventriculomegaly, mass effect, evidence of mass lesion, intracranial hemorrhage or evidence of cortically based acute infarction.  Vascular: Mild Calcified atherosclerosis at the skull base.6 No suspicious intracranial vascular hyperdensity. Skull: No acute osseous abnormality identified. Sinuses/Orbits: Visualized paranasal sinuses and mastoids are stable and well pneumatized. Other: Stable orbit and scalp soft  tissues. IMPRESSION: 1. No acute intracranial abnormality identified. 2. Suggestion of disproportionate biparietal cerebral volume loss since 2014, which can be seen in the setting of Alzheimer dementia. 3. However, chronic ischemic disease has also progressed. Consider also vascular causes of dementia. Electronically Signed   By: Genevie Ann M.D.   On: 05/16/2020 16:08    Assessment/Plan 1. Symptoms of urinary tract infection Has completed Keflex prescribed from ED.Need urine rechecked due to confusion.  - Urine culture   2. Essential hypertension B/p stable. Continue on metoprolol succinate 25 mg tablet daily  - CBC with Differential/Platelet - BMP with eGFR([facility])  3. Dementia with behavioral disturbance, unspecified dementia type ([place]) Status post ED visit for AMS suspected due to UTI. - BMP with eGFR([facility])  4. Allergic rhinitis due to pollen, unspecified seasonality Symptoms controlled on Allegra   Family/ staff Communication: Reviewed plan of care with patient and daughter   Labs/tests ordered: - CBC with Differential/Platelet - BMP with eGFR([facility])  Next Appointment: Has appointment 10/2020  [name], NP

## 2020-05-27 NOTE — Patient Instructions (Signed)
-   Labs drawn today will call you with results

## 2020-05-28 LAB — BASIC METABOLIC PANEL WITH GFR
BUN/Creatinine Ratio: 20 (calc) (ref 6–22)
BUN: 18 mg/dL (ref 7–25)
CO2: 28 mmol/L (ref 20–32)
Calcium: 9.5 mg/dL (ref 8.6–10.4)
Chloride: 101 mmol/L (ref 98–110)
Creat: 0.92 mg/dL — ABNORMAL HIGH (ref 0.60–0.88)
GFR, Est African American: 63 mL/min/{1.73_m2} (ref >=60)
GFR, Est Non African American: 54 mL/min/{1.73_m2} — ABNORMAL LOW (ref >=60)
Glucose, Bld: 107 mg/dL (ref 65–139)
Potassium: 3.8 mmol/L (ref 3.5–5.3)
Sodium: 137 mmol/L (ref 135–146)

## 2020-05-28 LAB — CBC WITH DIFFERENTIAL/PLATELET
Absolute Monocytes: 567 cells/uL (ref 200–950)
Basophils Absolute: 22 cells/uL (ref 0–200)
Basophils Relative: 0.4 %
Eosinophils Absolute: 39 cells/uL (ref 15–500)
Eosinophils Relative: 0.7 %
HCT: 35.2 % (ref 35.0–45.0)
Hemoglobin: 11.8 g/dL (ref 11.7–15.5)
Lymphs Abs: 930 cells/uL (ref 850–3900)
MCH: 32.6 pg (ref 27.0–33.0)
MCHC: 33.5 g/dL (ref 32.0–36.0)
MCV: 97.2 fL (ref 80.0–100.0)
MPV: 11.3 fL (ref 7.5–12.5)
Monocytes Relative: 10.3 %
Neutro Abs: 3944 cells/uL (ref 1500–7800)
Neutrophils Relative %: 71.7 %
Platelets: 137 10*3/uL — ABNORMAL LOW (ref 140–400)
RBC: 3.62 10*6/uL — ABNORMAL LOW (ref 3.80–5.10)
RDW: 12.1 % (ref 11.0–15.0)
Total Lymphocyte: 16.9 %
WBC: 5.5 10*3/uL (ref 3.8–10.8)

## 2020-05-29 LAB — URINE CULTURE
MICRO NUMBER:: 11230543
Result:: NO GROWTH
SPECIMEN QUALITY:: ADEQUATE

## 2020-06-07 ENCOUNTER — Telehealth: Payer: Self-pay

## 2020-06-07 NOTE — Telephone Encounter (Signed)
1142 am.  Phone call made to patient's home to follow up on her overall condition and schedule an in-person appointment.  Line is busy.  PLAN:  I will try to contact patient or daughter at a later time.

## 2020-06-10 ENCOUNTER — Telehealth: Payer: Self-pay

## 2020-06-10 NOTE — Telephone Encounter (Signed)
4PM: Palliative care SW returned TC to patients daughter, [name].  SW LVM awaiting return call.

## 2020-06-10 NOTE — Telephone Encounter (Signed)
218 pm.  Attempted to reach daughter [name] on her cell #. No answer but message has been left requesting a call back.

## 2020-06-10 NOTE — Telephone Encounter (Signed)
153 pm.  Message received from [facility] requesting follow up on Aide & Attendance.  Return call made but no answer and unable to leave a message.  PLAN: PC will attempt to reach daughter at a later date.

## 2020-06-14 ENCOUNTER — Telehealth: Payer: Self-pay

## 2020-06-14 NOTE — Telephone Encounter (Signed)
2:10PM: Palliative care SW outreached patients daughter, [name],  to schedule monthly in home visit. Visit scheduled for Wed 12/15 @noon ,

## 2020-06-16 ENCOUNTER — Other Ambulatory Visit: Payer: Self-pay

## 2020-06-16 ENCOUNTER — Telehealth (INDEPENDENT_AMBULATORY_CARE_PROVIDER_SITE_OTHER): Payer: Medicare Other | Admitting: Family

## 2020-06-16 ENCOUNTER — Encounter: Payer: Self-pay | Admitting: Family

## 2020-06-16 DIAGNOSIS — F0391 Unspecified dementia with behavioral disturbance: Secondary | ICD-10-CM | POA: Diagnosis not present

## 2020-06-16 DIAGNOSIS — K5901 Slow transit constipation: Secondary | ICD-10-CM

## 2020-06-16 MED ORDER — POLYETHYLENE GLYCOL 3350 17 GM/SCOOP PO POWD: 17.0000 g | Freq: Every day | ORAL | 1 refills | Status: AC

## 2020-06-16 NOTE — Progress Notes (Signed)
This service is provided via telemedicine  No vital signs collected/recorded due to the encounter was a telemedicine visit.   Location of patient (ex: home, work): Home.  Patient consents to a telephone visit: Yes  Location of the provider (ex: office, home):  [facility].   Name of any referring provider: Kareena Arrambide, Nelda Bucks, NP   Names of all persons participating in the telemedicine service and their role in the encounter: Patient, [name]/Daughter, Heriberto Antigua, RMA, Jonny Ruiz, [place], [place], [place], NP.    Time spent on call: 19 minutes spent on the phone with Medical Assistant.     Provider: Marlowe Sax FNP-C  Terrianne Cavness, Nelda Bucks, NP  Patient Care Team: Zymier Rodgers, Nelda Bucks, NP as PCP - General (Family Medicine) Whitney Muse, Kelby Fam, MD (Inactive) as Consulting Physician (Hematology and Oncology) Rutherford Guys, MD as Consulting Physician (Ophthalmology) Delice Lesch Lezlie Octave, MD as Consulting Physician (Neurology)  Extended Emergency Contact Information Primary Emergency Contact: Mikey College Address: [address]          [place], [place] [address] Johnnette Litter of [place] Phone: [phone] Mobile Phone: [phone] Relation: Daughter Secondary Emergency Contact: [place] Phone: [phone] Mobile Phone: [phone] Relation: Daughter  Code Status: Full Code  Goals of care: Advanced Directive information Advanced Directives 06/16/2020  Does Patient Have a Medical Advance Directive? Yes  Type of Advance Directive [place]  Does patient want to make changes to medical advance directive? No - Patient declined  Copy of [place] in Chart? Yes - validated most recent copy scanned in chart (See row information)  Would patient like information on creating a medical advance directive? -     Chief Complaint  Patient presents with   Acute Visit    Complains of Increase Agitation and not Eating. Combative  and ongoing short-term memory issues. Patient also with bowel issues. Patient not drinking water as recommended.     HPI:  Pt is a 86 y.o. female seen today for an acute visit for evaluation of increased agitation and not eating.Her Daughter [name] Provided HPI information.she states patient kept going back and forth to the bathroom yesterday.she did not eat her cereal yesterday until late in the afternoon around 3: 40 Pm she finally had a bowel movement on her depends.she resisted care from her daughter.But was able to change it. She eats the food but due to her memory does not eat at times.Takes long to get out of her room for meals. Holding pills in the mouth and talking. [place] the day after thanksgiving from being weak due to not eating.she did not sustain any injuries.Daughter gave some juice. Some days she does well and other days does not eat.  Seems to be more confused asking whether she is at home.sometimes wants to to go home despite being in the house.she bothers daughter with same questions all day.Also tells daughter that her brother was coming home yet the brother has been dead for many years.discussed with [name] that this is consistent with patient's dementia.I've discussed with to destruct and avoid arguing with patient.She has tried this which has helped but sometimes makes her irritated too and end up yelling at her but this has improved since she knows the mother has dementia. Still having issues with her sisters helping her with care for the patient.Has Palliative care social worker and Nurse coming for a visit next Wednesday.still trying to get VA benefits to get more assistance with her ADL's.  Past Medical History:  Diagnosis Date   Allergic rhinitis, cause unspecified    Angioneurotic edema not elsewhere classified    Anxiety disorder    Asthma    Carotid bruit    Carpal tunnel syndrome    Chronic abdominal pain    Fecal incontinence    GERD  (gastroesophageal reflux disease)    Hyperlipidemia    IBS (irritable bowel syndrome)    Insomnia    Lung nodule    Osteoarthritis    Palpitations    Pancytopenia (HCC)    Senile osteoporosis    Past Surgical History:  Procedure Laterality Date   ABDOMINAL HYSTERECTOMY  1979   fibroids    bilateral cataract surgery  2008   BIOPSY THYROID  1970    COLONOSCOPY  12/21/04   normal -redundant    EUS  08/07/07   EYE SURGERY      Allergies  Allergen Reactions   Diphenhydramine Hcl Other (See Comments)    unknown   Rivastigmine Tartrate Palpitations    Outpatient Encounter Medications as of 06/16/2020  Medication Sig   donepezil (ARICEPT) 10 MG tablet Take 1 tablet (10 mg total) by mouth at bedtime.   fexofenadine (ALLEGRA) 180 MG tablet Take 180 mg by mouth as needed.   fluticasone (FLONASE) 50 MCG/ACT nasal spray Place 1 spray into both nostrils as needed.   Melatonin 10 MG TABS Take 10 mg by mouth daily.   metoprolol succinate (TOPROL-XL) 25 MG 24 hr tablet Take one tablet by mouth once daily.   sertraline (ZOLOFT) 50 MG tablet Take 1 tablet (50 mg total) by mouth daily.   vitamin C (ASCORBIC ACID) 500 MG tablet Take 500 mg by mouth daily.   [DISCONTINUED] cephALEXin (KEFLEX) 250 MG capsule Take 1 capsule (250 mg total) by mouth 4 (four) times daily.   No facility-administered encounter medications on file as of 06/16/2020.    Review of Systems  Constitutional: Negative for appetite change, chills, fatigue and fever.  HENT: Negative for congestion, postnasal drip, rhinorrhea, sinus pressure, sinus pain, sneezing and sore throat.   Eyes: Negative for pain, discharge, redness and itching.  Respiratory: Negative for cough, chest tightness, shortness of breath and wheezing.   Cardiovascular: Negative for chest pain, palpitations and leg swelling.  Gastrointestinal: Positive for constipation. Negative for abdominal distention, abdominal pain, diarrhea,  nausea and vomiting.  Genitourinary: Negative for difficulty urinating, dysuria, flank pain, frequency and urgency.  Musculoskeletal: Positive for arthralgias. Negative for back pain, gait problem and joint swelling.  Skin: Negative for color change, pallor and rash.  Neurological: Negative for dizziness, speech difficulty, weakness, light-headedness and headaches.  Hematological: Does not bruise/bleed easily.  Psychiatric/Behavioral: Positive for behavioral problems and confusion. Negative for agitation and sleep disturbance. The patient is not nervous/anxious.     Immunization History  Administered Date(s) Administered   Fluad Quad(high Dose 65+) 03/25/2019, 04/29/2020   Influenza Split 03/27/2011, 04/09/2012   Influenza Whole 03/31/2007, 03/29/2009, 04/12/2009, 03/14/2010   Influenza, High Dose Seasonal PF 04/30/2018   Influenza,inj,Quad PF,6+ Mos 03/31/2013, 04/07/2014, 03/08/2015, 02/23/2016, 06/06/2016, 06/12/2017   Moderna SARS-COVID-2 Vaccination 09/12/2019, 10/14/2019   PPD Test 06/12/2017, 12/22/2018   Pneumococcal Conjugate-13 08/05/2014   Pneumococcal Polysaccharide-23 02/11/2006   Td 04/18/2005   Pertinent  Health Maintenance Due  Topic Date Due   INFLUENZA VACCINE  Completed   DEXA SCAN  Completed   PNA vac Low Risk Adult  Completed   Fall Risk  05/27/2020 04/29/2020 04/08/2020 10/13/2019 09/14/2019  Falls in  the past year? 0 - 1 1 0  Number falls in past yr: 0 1 0 0 0  Injury with Fall? 0 0 0 0 0  Risk for fall due to : - - - - -   Functional Status Survey:    There were no vitals filed for this visit. There is no height or weight on file to calculate BMI. Physical Exam Unable to complete on the Phone visit. Labs reviewed: Recent Labs    04/08/20 1543 05/16/20 1815 05/27/20 1619  NA 137 136 137  K 4.1 3.9 3.8  CL 102 102 101  CO2 27 25 28   GLUCOSE 92 96 107  BUN 14 20 18   CREATININE 0.98* 0.85 0.92*  CALCIUM 9.7 9.2 9.5   Recent Labs     04/08/20 1543 05/16/20 1815  AST 16 20  ALT 7 14  ALKPHOS  --  35*  BILITOT 0.6 0.6  PROT 7.9 7.9  ALBUMIN  --  3.8   Recent Labs    04/08/20 1543 05/16/20 1815 05/27/20 1619  WBC 4.9 5.0 5.5  NEUTROABS 2,656 2.6 3,944  HGB 11.5* 11.1* 11.8  HCT 34.0* 34.2* 35.2  MCV 95.8 100.6* 97.2  PLT 143 130* 137*   Lab Results  Component Value Date   TSH 3.44 04/08/2020   No results found for: HGBA1C Lab Results  Component Value Date   CHOL 217 (H) 04/08/2020   HDL 70 04/08/2020   LDLCALC 130 (H) 04/08/2020   TRIG 77 04/08/2020   CHOLHDL 3.1 04/08/2020    Significant Diagnostic Results in last 30 days:  No results found.  Assessment/Plan 1. Slow transit constipation Encouraged to increase fiber in diet.Advised to provider small but frequent meals if patient unable to sit for meals.  -Advised to increase water intake to 6-8 glasses daily. - start on Miralax 17 gm powder mix in 8 oz of fluid.may hold for loose stool.  - polyethylene glycol powder (GLYCOLAX/MIRALAX) 17 GM/SCOOP powder; Take 17 g by mouth daily. Hold for loose stool  Dispense: 3350 g; Refill: 1  2. Dementia with behavioral disturbance, unspecified dementia type ([place]) - Discussed at length with daughter non-pharmacological measure to use when patient resisting care or being combative.Advised to discuss with the rest of the sister goals of care if patient's continues to decline and not eating recommended Hospice service.Daughter [name] will meet with palliative social work and Nurse upcoming week then notify provider if Hospice care desired.   Family/ staff Communication: Reviewed plan of care with patient's daughter [name].   Labs/tests ordered: None   Next Appointment: As needed if symptoms worsen or fail to improve.  I connected with  Justus Duerr Ricciuti on 06/19/20 by a Telephone Visit enabled telemedicine application and verified that I am speaking with the correct person using two identifiers.   I  discussed the limitations of evaluation and management by telemedicine. The patient expressed understanding and agreed to proceed.  Time spent with patient,Patient's daughter 60 minutes >50% time spent counseling,taking care of dementia patient; reviewing medical record; tests; labs; and developing future plan of care  [name], NP

## 2020-06-18 ENCOUNTER — Other Ambulatory Visit: Payer: Self-pay | Admitting: Family

## 2020-06-20 NOTE — Telephone Encounter (Signed)
Pharmacy requested refill.  Pended Rx and sent to [name] for approval due to HIGH ALERT Warning.  

## 2020-06-22 ENCOUNTER — Other Ambulatory Visit: Payer: Self-pay

## 2020-06-22 ENCOUNTER — Other Ambulatory Visit: Payer: Medicare Other

## 2020-06-22 VITALS — BP 138/72 | HR 78 | Temp 97.7°F | Resp 20 | Wt 130.0 lb

## 2020-06-22 DIAGNOSIS — Z515 Encounter for palliative care: Secondary | ICD-10-CM

## 2020-06-22 NOTE — Progress Notes (Signed)
COMMUNITY PALLIATIVE CARE SW NOTE  PATIENT NAME: Kathleen Gallegos DOB: 12/21/1925 MRN: [id number]  PRIMARY CARE PROVIDER: [name], NP  RESPONSIBLE PARTY:  Acct ID - Guarantor Home Phone Work Phone Relationship Acct Type  [id number] [name]* [phone]  Self P/F     [place], [place],  [address]     PLAN OF CARE and INTERVENTIONS:             1. GOALS OF CARE/ ADVANCE CARE PLANNING:  Patient is a DNR currently. Patients middle daughter, Kathleen Gallegos, has guardianship of patient.  2.         SOCIAL/EMOTIONAL/SPIRITUAL ASSESSMENT/ INTERVENTIONS:  SW met with patient and patients daughter Kathleen Gallegos. Patient lives in a one story home with daughter. Daughter is primary caregiver. Daughter provided overview and update on patients care and possible needs. Daughter shared that patient has had more behaviors since last I person visit and has had a ED visit due to behaviors that resulted in patient having a UTI, patient has now completed ABT. Patient had recent telemedicine visit with PCP last week, daughter was provided more consultation about patients dementia and long term planning. SW continues to encourage ALF-MC placement and for daughter to discuss this with her sisters. Per daughter, patient is having more episodes of trying to go home and attempted to exit seek once. Patient has combative behaviors towards daughter. Daughter shares that patient is not eating as much anymore. Patients current weight is 130, which is down 5lbs from last month. Daughter mentioned that PCP mentioned Hospice at this time. RN checked vitals and reviewed medications. RN assisted with changing patients clothes during this visit. Patients daughter, Kathleen Gallegos is still needing to supply supporting documentation for Aid & Attendance application. SW assisted in putting together a shower bench for patient during visit.  SW discussed goals, reviewed care plan, provided emotional support, used active and reflective  listening. Palliative care will continue to monitor and assist with long term care planning as needed.  3.         PATIENT/CAREGIVER EDUCATION/ COPING:  Patient alert with confusion. Patients dementia is progressed. Patient enjoyed sewing and watching westerns. Patient has 3 children and a lot of grandchildren and great grandchildren. Continued caregiver support provided to daughter. 4.         PERSONAL EMERGENCY PLAN:  Family will call 9-1-1 for emergencies.  5.         COMMUNITY RESOURCES COORDINATION/ HEALTH CARE NAVIGATION:  Patient's daughter manages care and is primary caregiver.  6.         FINANCIAL/LEGAL CONCERNS/INTERVENTIONS: Patient on fixed income, daughter Kathleen Gallegos, is guardian over finances and person.       SOCIAL HX:  Social History   Tobacco Use   Smoking status: Never Smoker   Smokeless tobacco: Former Systems developer    Types: Snuff  Substance Use Topics   Alcohol use: No    CODE STATUS: DNR  ADVANCED DIRECTIVES: Y MOST FORM COMPLETE: N HOSPICE EDUCATION PROVIDED:Y  PPS: Patient is able to dress self. Patient wears briefs but is continent and able to toilet self. Patient requires assistance with bathing. Patient is able to feed self.  Time Spent: 1hr 45 min,     [name], [place]

## 2020-06-22 NOTE — Progress Notes (Signed)
PATIENT NAME: Kathleen Gallegos DOB: 09-17-1925 MRN: [id number]  PRIMARY CARE PROVIDER: [name], NP  RESPONSIBLE PARTY:  Acct ID - Guarantor Home Phone Work Phone Relationship Acct Type  [id number] [name]* [phone]  Self P/F     [place], [place], [place] [address]    PLAN OF CARE and INTERVENTIONS:               1.  GOALS OF CARE/ ADVANCE CARE PLANNING:  Discussion on long term placement.               2.  PATIENT/CAREGIVER EDUCATION:  Safety and LTC placement.               4. PERSONAL EMERGENCY PLAN:  Family will activate 911 for emergencies.               5.  DISEASE STATUS:  Follow up visit completed with daughter [name] and patient.  Patient is found in her room with jeans and a pajama top on.  Patient ambulated to the living room with standby assistance.  Patient required assistance with dressing.  She has tied a seamstress tape measure and scarf around her waist to keep her pants up.  Patient had the waistband of an old depends around her chest.  Assisted patient with dressing and she is able to follow commands.  During the dressing of patient, she is talking about going home.  Daughter discussed worsening sundowning where patient has increase agitation and resistance to care.  She has opened to door on one occasion but was unable to unlock the screen door to exit.  We have discussed long term care placement in a memory care facility.  However the daughter is unable to place patient as she does not hold guardianship over the patient.  PC SW has made multiple attempts to reach daughter [name] to discuss PCS services and complete paperwork but has been unsuccessful.  Daughter states patient is toileting herself but she is Systems analyst with some personal care. Daughter is able to talk patient through steps and patient is able to follow commands.  Patient is unable to dress herself appropriately.  Daughter will pick clothes out that are appropriate for the season.   Patient is unable to bath herself and daughter has not been successful with this task due to patient's resistance.  Po intake has decreased over the last 3-4 weeks. Weight obtained on this visit.  6 months ago patient weighed 139 lbs.  Her current weight is a 130 lbs.  This represents a 9 lb weight loss in 6 months and a 5 lb weight loss in the last month.  Current BMI is 21.  Patient is becoming more incontinent of urine and continues with depends.   Daughter noted one episode of bowel incontinence but struggled to provide care to patient as she was resist to care.  Actively listening provided as the daughter discussed the challenges of caregiving and caregiver fatigue.  FAST Score: 6a   HISTORY OF PRESENT ILLNESS:  86 year old female with Dementia.  Patient is being followed by Palliative Care monthly and PRN.  CODE STATUS: DNR ADVANCED DIRECTIVES: No MOST FORM: No PPS: 50%   PHYSICAL EXAM:   VITALS: Today's Vitals   06/22/20 1214  BP: 138/72  Pulse: 78  Resp: 20  Temp: 97.7 F (36.5 C)  SpO2: 97%    LUNGS: CTA CARDIAC: HRR EXTREMITIES: No edema SKIN: Warm and dry to touch.  No skin  breakdown reported by daughter. NEURO: Alert to person.       Lorenza Burton, RN

## 2020-07-19 ENCOUNTER — Telehealth: Payer: Self-pay | Admitting: Neurology

## 2020-07-19 NOTE — Telephone Encounter (Signed)
Please see phone note. This is definitely a social/family issue at this point, is there something your office can do, would a Education officer, museum be able to help with family situation and get daughter the help she needs? Thanks so much.

## 2020-07-19 NOTE — Telephone Encounter (Signed)
I did have a meeting with patient's three daughter and discussed placement to memory care.They had  social issues and trying to get assistance through the [place].Home Personal care assistance was also ordered but patient's daughter did not want staff coming home due to COVID-19.Had day care program but patient did not like.Now has Education officer, museum to assist for placement into  A facility.Once they have a facility I will be glad to fill out [place] 2  Form again.

## 2020-07-19 NOTE — Telephone Encounter (Signed)
Patient's daughter called in. She stated as they were leaving the last visit on 04/08/20 Dr. Delice Lesch mentioned to let her know if she felt they needed a "combined meeting". She was not sure exactly what Dr. Delice Lesch meant. If that would have been with the social worker or family members? She does feel that she is becoming overwhelmed with some day to day issues of when her mother asks to "go home" when she is home and getting dressed, etc.

## 2020-07-19 NOTE — Telephone Encounter (Signed)
Spoke with daughter who states she tried it last June and the other sisters do not follow up. She states she is the primary caregiver.  Daughter states she has a Education officer, museum. She states she does not feel like she can do this by herself and she is not able to get rest. She states her sisters have not gotten together to do any kind of family planning. She states her sister is not taking this issue serious enough.   Spoke with Daughter to see what she would like Dr Delice Lesch to do. She states she is drained and spending money.   She states her mother is on the bad side and the patient is declining.

## 2020-07-19 NOTE — Telephone Encounter (Signed)
What I had discussed and encouraged her to do is is to have a family meeting with her PCP and her sisters so that everyone is on board to get her mother and her the help needed. Since she is overwhelmed, she cannot do this by herself anymore so her mother needs higher level of care like Memory Care. This meeting can be done with her PCP. Thanks

## 2020-07-20 NOTE — Telephone Encounter (Signed)
Thank you very much 

## 2020-08-03 ENCOUNTER — Telehealth: Payer: Self-pay

## 2020-08-03 NOTE — Telephone Encounter (Signed)
8:50AM: Palliative care SW returned TC to patients daughter , [name].   Call unsuccessful. SW LVM.

## 2020-08-04 ENCOUNTER — Telehealth: Payer: Self-pay

## 2020-08-04 NOTE — Telephone Encounter (Signed)
08/02/20 230 pm.  Outgoing call to daughter Kathleen Gallegos to complete a telephonic visit.  Daughter is reporting no significant changes since my last visit.  Patient continues to be resistant to her providing care.  Daughter is giving patient direction but has been unable to give patient a shower.  Patient is staying up for longer periods in the evening and sleeping in later.  She remains incontinent of urine and is changing her brief when daughter asks her to complete this task.  Po intake remains fair.  No weight loss is noted by daughter but weights are not being obtained.  Advised that I would check this on our next visit.  Daughter voices her concerns over caregiver fatigue.  She also is questioning the status of aide and attendance application.  I have provided the information that was given my Palliative Care SW.  Application is incomplete until financial piece can be completed.  Patient's other daughter [name] has this information but Palliative Care has been unable to reach her.  Kathleen Gallegos will reach out to her sister again and request that she follow up with Palliative Care SW to provide this information.  A visit has been scheduled for next month.

## 2020-08-04 NOTE — Telephone Encounter (Signed)
0900:Palliative care SW returned TC to patients daughter , [name].   Call unsuccessful. SW LVM.

## 2020-08-09 ENCOUNTER — Telehealth: Payer: Self-pay | Admitting: *Deleted

## 2020-08-09 NOTE — Telephone Encounter (Signed)
Patient daughter, [name] called and stated that she just wanted to let you know that patient is not drinking her water as much as she could be. Stated that she is getting a cup at breakfast and a cup at supper. Daughter is continuously reminding her to drink, but patient does not.  Daughter is just wanting to make you aware.    Also, daughter was wanting to order the Free at [place] went on the website and ordered them for patient and had them shipped to her home.  Order #: [id number]

## 2020-08-09 NOTE — Telephone Encounter (Signed)
Has she been eating all her meals?

## 2020-08-10 NOTE — Telephone Encounter (Signed)
Continue to encourage to drink fluid small sips but frequent.

## 2020-08-10 NOTE — Telephone Encounter (Signed)
Spoke with Daughter and she stated that patient is eating her meals.

## 2020-08-10 NOTE — Telephone Encounter (Signed)
Patient daughter aware. °

## 2020-08-11 NOTE — Progress Notes (Signed)
12:30PM: Palliative care SW received additional supporting documents from patients daughter, [name], for the [place] Aid & Attendance application. SW faxed all documents and re-faxed original mailed application to [employer]. For processing.

## 2020-08-12 ENCOUNTER — Other Ambulatory Visit: Payer: Medicare Other

## 2020-08-12 ENCOUNTER — Other Ambulatory Visit: Payer: Self-pay

## 2020-08-12 VITALS — BP 122/64 | HR 78 | Temp 98.5°F | Resp 18 | Wt 126.0 lb

## 2020-08-12 DIAGNOSIS — Z515 Encounter for palliative care: Secondary | ICD-10-CM

## 2020-08-12 NOTE — Progress Notes (Signed)
PATIENT NAME: Kathleen Gallegos DOB: Nov 13, 1925 MRN: [id number]  PRIMARY CARE PROVIDER: [name], NP  RESPONSIBLE PARTY:  Acct ID - Guarantor Home Phone Work Phone Relationship Acct Type  [id number] Kathleen Gallegos* [phone]  Self P/F     [place], [place], [place] [address]    PLAN OF CARE and INTERVENTIONS:               1.  GOALS OF CARE/ ADVANCE CARE PLANNING:  Daughter is seeking placement.               2.  PATIENT/CAREGIVER EDUCATION:  Aide and Attendance and LTC placement..               4. PERSONAL EMERGENCY PLAN:  Activate 911 for emergencies.               5.  DISEASE STATUS:  Joint visit completed with [name], SW, patient and daughter Kathleen Gallegos.  Patient is found in her recliner chair.  She is repeative in conversation.  Patient engages in conversations about her father and sewing.  Patient is noted to have a 4 lb weight loss since my last visit in December.  Her current BMI is 20.3.  Po intake is fair.  Patient is eating 2-3 meals a day.  Ensure supplements are being used daily.  No falls are reported since last November at Thanksgiving.  Patient does have an unsteady gait but she is not using any assistive devices.  Patient is holding pills in her mouth and is requiring cues to swallow her medications.  Daughter continues to process through caregiver fatigue. Active listening provided during this visit.  Patient continues to have sundowning and has some aggressive tendencies toward her daughter.  Daughter is not providing assistance with showers/baths due to her concern over patient becoming combative with her.  Visit scheduled for next month with 2 daughters to establish goals and possible placement.  HISTORY OF PRESENT ILLNESS:  87 year old female with Dementia.  Patient is being followed by Palliative Care monthly and PRN.  CODE STATUS: Full ADVANCED DIRECTIVES: No MOST FORM: No PPS: 50%   PHYSICAL EXAM:   VITALS: Today's Vitals   08/12/20 1241   Pulse: 78  Resp: 18  Temp: 98.5 F (36.9 C)  SpO2: 98%  PainSc: 0-No pain    LUNGS: CTA-no shortness of breath reported. CARDIAC: HRR EXTREMITIES: no edema present. SKIN: warm and dry to touch.  No skin breakdown noted by daughter. NEURO: alert to self.       Lorenza Burton, RN

## 2020-08-12 NOTE — Progress Notes (Signed)
COMMUNITY PALLIATIVE CARE SW NOTE  PATIENT NAME: Kathleen Gallegos DOB: Jul 27, 1925 MRN: [id number]  PRIMARY CARE PROVIDER: [name], NP  RESPONSIBLE PARTY:  Acct ID - Guarantor Home Phone Work Phone Relationship Acct Type  [id number] [name]* [phone]  Self P/F     [place], [place],  [address]     PLAN OF CARE and INTERVENTIONS:             1.  GOALS OF CARE/ ADVANCE CARE PLANNING:  Patient is a DNR currently. Patient's middle daughter, Kathleen Gallegos, has guardianship of patient.   2.         SOCIAL/EMOTIONAL/SPIRITUAL ASSESSMENT/ INTERVENTIONS:  SW met with patient and patient's daughter Kathleen Gallegos. Patient lives in a one story home with daughter. Daughter is primary caregiver. Daughter provided overview and update on patients care and possible needs.   Daughter shared that she has had difficulties herself and is worried about long term plans for patient should she not be able to provide care for her any longer. SW continues to encourage ALF-[facility] placement and for daughter to discuss this with her sisters. SW working on [employer] and attendance application as well for in home assistance. Patient is not eligible for community Medicaid due to income ($970 SS, $180 spousal survival benefits, $27 pension). SW discussed [facility]F-[facility] Medicaid with daughter as well in detail.   Per daughter, patient's confusion, argumentative lashes and combativeness continues to be the same. Daughter shares that patient is not eating as much anymore. Daughter offers supplements such as ensure. Patient is not drinking water much any longer.  Patient's current weight is 126lb, which is down 4lbs from last month.   RN checked vitals and reviewed medications.   SW discussed goals, reviewed care plan, provided emotional support, used active and reflective listening. Palliative care will continue to monitor and assist with long term care planning as needed.   3.         PATIENT/CAREGIVER EDUCATION/ COPING:   Patient alert with confusion. Patient's dementia is progressed. Patient enjoyed sewing and watching westerns. Patient has 3 children and a lot of grandchildren and great grandchildren. Daughter shares that she feels patient is having increased anxiety.  Continued caregiver support provided to daughter.  4.         PERSONAL EMERGENCY PLAN:  Family will call 9-1-1 for emergencies.   5.         COMMUNITY RESOURCES COORDINATION/ HEALTH CARE NAVIGATION:  Patient's daughter manages care and is primary caregiver.   6.         FINANCIAL/LEGAL CONCERNS/INTERVENTIONS: Patient on fixed income, daughter Kathleen Gallegos, is guardian over finances and person.       SOCIAL HX:  Social History   Tobacco Use  . Smoking status: Never Smoker  . Smokeless tobacco: Former Systems developer    Types: Snuff  Substance Use Topics  . Alcohol use: No    CODE STATUS: DNR ADVANCED DIRECTIVES: Y MOST FORM COMPLETE: N HOSPICE EDUCATION PROVIDED: N  PPS: Patient is ambulatory w/o AD. Patient is able to feedself and toilet self. Patient is incontinent sometimes. Patient wears briefs. Daughter preps meals   Time spent: 1hr 58mn      [place]

## 2020-08-30 ENCOUNTER — Encounter: Payer: Self-pay | Admitting: Orthopedic Surgery

## 2020-08-30 ENCOUNTER — Ambulatory Visit: Payer: Self-pay | Admitting: Orthopedic Surgery

## 2020-08-30 ENCOUNTER — Other Ambulatory Visit: Payer: Self-pay

## 2020-08-30 ENCOUNTER — Ambulatory Visit (INDEPENDENT_AMBULATORY_CARE_PROVIDER_SITE_OTHER): Payer: Medicare Other | Admitting: Orthopedic Surgery

## 2020-08-30 VITALS — BP 120/60 | HR 62 | Temp 97.9°F | Resp 20 | Ht 66.0 in | Wt 132.0 lb

## 2020-08-30 DIAGNOSIS — R350 Frequency of micturition: Secondary | ICD-10-CM | POA: Diagnosis not present

## 2020-08-30 MED ORDER — RISAQUAD PO CAPS: 1.0000 | ORAL_CAPSULE | Freq: Every day | ORAL | Status: DC

## 2020-08-30 MED ORDER — CIPROFLOXACIN HCL 500 MG PO TABS: 500.0000 mg | ORAL_TABLET | Freq: Two times a day (BID) | ORAL | 0 refills | Status: DC

## 2020-08-30 NOTE — Progress Notes (Signed)
Location:   [place]:   [place] Provider:  Windell Moulding, AGNP-C  Ngetich, Nelda Bucks, NP  Patient Care Team: Ngetich, Nelda Bucks, NP as PCP - General (Family Medicine) Whitney Muse, Kelby Fam, MD (Inactive) as Consulting Physician (Hematology and Oncology) Rutherford Guys, MD as Consulting Physician (Ophthalmology) Delice Lesch Lezlie Octave, MD as Consulting Physician (Neurology)  Extended Emergency Contact Information Primary Emergency Contact: Mikey College Address: [address]          [place], [place] [address] Johnnette Litter of [place] Phone: [phone] Mobile Phone: [phone] Relation: Daughter Secondary Emergency Contact: [place] Phone: [phone] Mobile Phone: [phone] Relation: Daughter   Goals of care: Advanced Directive information Advanced Directives 08/30/2020  Does Patient Have a Medical Advance Directive? Yes  Type of Advance Directive -  Does patient want to make changes to medical advance directive? No - Patient declined  Copy of [place] in Chart? -  Would patient like information on creating a medical advance directive? -     Chief Complaint  Patient presents with  . Acute Visit    Complaints of cloudy urine and behaviors         HPI:  Pt is a 87 y.o. female seen today for an acute visit for increased confusion and cloudy urine.   Daughter present for visit.   She has been having trouble falling asleep. Daughter claims she is combative at times. History of UTI in past, last one November 2021. Today, daughter noticed mothers urin was cloudy and strong odor. Claims she has not been drinking water lately. Daughter has been having trouble keeping her hydrated.   Social worker continues to make home visit. Social worker mentioned possibly providing assistive care, two other daughters needed for approval.   Past Medical History:  Diagnosis Date  . Allergic rhinitis, cause unspecified    . Angioneurotic edema not elsewhere classified   . Anxiety disorder   . Asthma   . Carotid bruit   . Carpal tunnel syndrome   . Chronic abdominal pain   . Fecal incontinence   . GERD (gastroesophageal reflux disease)   . Hyperlipidemia   . IBS (irritable bowel syndrome)   . Insomnia   . Lung nodule   . Osteoarthritis   . Palpitations   . Pancytopenia ([facility])   . Senile osteoporosis    Past Surgical History:  Procedure Laterality Date  . ABDOMINAL HYSTERECTOMY  1979   fibroids   . bilateral cataract surgery  2008  . BIOPSY THYROID  1970   . COLONOSCOPY  12/21/04   normal -redundant   . EUS  08/07/07  . EYE SURGERY      Allergies  Allergen Reactions  . Diphenhydramine Hcl Other (See Comments)    unknown  . Rivastigmine Tartrate Palpitations    Outpatient Encounter Medications as of 08/30/2020  Medication Sig  . donepezil (ARICEPT) 10 MG tablet TAKE 1 TABLET BY MOUTH AT BEDTIME  . fexofenadine (ALLEGRA) 180 MG tablet Take 180 mg by mouth as needed.  . fluticasone (FLONASE) 50 MCG/ACT nasal spray Place 1 spray into both nostrils as needed.  . Melatonin 10 MG TABS Take 10 mg by mouth daily.  . metoprolol succinate (TOPROL-XL) 25 MG 24 hr tablet Take one tablet by mouth once daily.  . polyethylene glycol powder (GLYCOLAX/MIRALAX) 17 GM/SCOOP powder Take 17 g by mouth daily. Hold for loose stool  . sertraline (ZOLOFT) 50 MG tablet Take 1 tablet (  50 mg total) by mouth daily.  . vitamin C (ASCORBIC ACID) 500 MG tablet Take 500 mg by mouth daily.   No facility-administered encounter medications on file as of 08/30/2020.    Review of Systems  Unable to perform ROS: Dementia    Immunization History  Administered Date(s) Administered  . Fluad Quad(high Dose 65+) 03/25/2019, 04/29/2020  . Influenza Split 03/27/2011, 04/09/2012  . Influenza Whole 03/31/2007, 03/29/2009, 04/12/2009, 03/14/2010  . Influenza, High Dose Seasonal PF 04/30/2018  . Influenza,inj,Quad PF,6+ Mos  03/31/2013, 04/07/2014, 03/08/2015, 02/23/2016, 06/06/2016, 06/12/2017  . Moderna Sars-Covid-2 Vaccination 09/12/2019, 10/14/2019  . PPD Test 06/12/2017, 12/22/2018  . Pneumococcal Conjugate-13 08/05/2014  . Pneumococcal Polysaccharide-23 02/11/2006  . Td 04/18/2005   Pertinent  Health Maintenance Due  Topic Date Due  . INFLUENZA VACCINE  Completed  . DEXA SCAN  Completed  . PNA vac Low Risk Adult  Completed   Fall Risk  05/27/2020 04/29/2020 04/08/2020 10/13/2019 09/14/2019  Falls in the past year? 0 - 1 1 0  Number falls in past yr: 0 1 0 0 0  Injury with Fall? 0 0 0 0 0  Risk for fall due to : - - - - -   Functional Status Survey:    Vitals:   08/30/20 1557  Resp: 20  Temp: 97.9 F (36.6 C)  TempSrc: Temporal  Weight: 132 lb (59.9 kg)  Height: 5\' 6"  (1.676 m)   Body mass index is 21.31 kg/m. Physical Exam Vitals reviewed.  Constitutional:      General: She is not in acute distress. HENT:     Head: Normocephalic.  Cardiovascular:     Rate and Rhythm: Normal rate and regular rhythm.     Pulses: Normal pulses.     Heart sounds: Normal heart sounds. No murmur heard. No friction rub.  Pulmonary:     Effort: Pulmonary effort is normal. No respiratory distress.     Breath sounds: Normal breath sounds. No wheezing.  Abdominal:     General: Bowel sounds are normal. There is no distension.     Palpations: Abdomen is soft.     Tenderness: There is no abdominal tenderness.  Genitourinary:    Comments: Urine yellow with pungent odor.  Musculoskeletal:     Right lower leg: No edema.     Left lower leg: No edema.  Skin:    General: Skin is warm and dry.     Capillary Refill: Capillary refill takes less than 2 seconds.  Neurological:     General: No focal deficit present.     Mental Status: She is alert. Mental status is at baseline.     Motor: Weakness present.     Gait: Gait abnormal.  Psychiatric:        Mood and Affect: Mood normal.        Behavior: Behavior  normal.     Labs reviewed: Recent Labs    04/08/20 1543 05/16/20 1815 05/27/20 1619  NA 137 136 137  K 4.1 3.9 3.8  CL 102 102 101  CO2 27 25 28   GLUCOSE 92 96 107  BUN 14 20 18   CREATININE 0.98* 0.85 0.92*  CALCIUM 9.7 9.2 9.5   Recent Labs    04/08/20 1543 05/16/20 1815  AST 16 20  ALT 7 14  ALKPHOS  --  35*  BILITOT 0.6 0.6  PROT 7.9 7.9  ALBUMIN  --  3.8   Recent Labs    04/08/20 1543 05/16/20 1815 05/27/20 1619  WBC 4.9 5.0 5.5  NEUTROABS 2,656 2.6 3,944  HGB 11.5* 11.1* 11.8  HCT 34.0* 34.2* 35.2  MCV 95.8 100.6* 97.2  PLT 143 130* 137*   Lab Results  Component Value Date   TSH 3.44 04/08/2020   No results found for: HGBA1C Lab Results  Component Value Date   CHOL 217 (H) 04/08/2020   HDL 70 04/08/2020   LDLCALC 130 (H) 04/08/2020   TRIG 77 04/08/2020   CHOLHDL 3.1 04/08/2020    Significant Diagnostic Results in last 30 days:  No results found.  Assessment/Plan 1. Urinary frequency - increased confusion with cloudy urine and strong odor - suspect UTI, history of one in November 2021 - encourage hydration- drank 2 glasses of water in office - recommend cranberry supplement daily - recommend probiotic while on antibiotic - Culture, Urine - ciprofloxacin (CIPRO) 500 MG tablet; Take 1 tablet (500 mg total) by mouth 2 (two) times daily.  Dispense: 14 tablet; Refill: 0  I provided 25 minutes of face-to-face time during this encounter.    Family/ staff Communication: Plan discussed with daughter  Labs/tests ordered:  Urine culture

## 2020-08-30 NOTE — Patient Instructions (Addendum)
Encourage hydration.  Recommend purchasing cranberry pill at local grocery/drugstore.   Urinary Tract Infection, Adult A urinary tract infection (UTI) is an infection of any part of the urinary tract. The urinary tract includes:  The kidneys.  The ureters.  The bladder.  The urethra. These organs make, store, and get rid of pee (urine) in the body. What are the causes? This infection is caused by germs (bacteria) in your genital area. These germs grow and cause swelling (inflammation) of your urinary tract. What increases the risk? The following factors may make you more likely to develop this condition:  Using a small, thin tube (catheter) to drain pee.  Not being able to control when you pee or poop (incontinence).  Being female. If you are female, these things can increase the risk: ? Using these methods to prevent pregnancy:  A medicine that kills sperm (spermicide).  A device that blocks sperm (diaphragm). ? Having low levels of a female hormone (estrogen). ? Being pregnant. You are more likely to develop this condition if:  You have genes that add to your risk.  You are sexually active.  You take antibiotic medicines.  You have trouble peeing because of: ? A prostate that is bigger than normal, if you are female. ? A blockage in the part of your body that drains pee from the bladder. ? A kidney stone. ? A nerve condition that affects your bladder. ? Not getting enough to drink. ? Not peeing often enough.  You have other conditions, such as: ? Diabetes. ? A weak disease-fighting system (immune system). ? Sickle cell disease. ? Gout. ? Injury of the spine. What are the signs or symptoms? Symptoms of this condition include:  Needing to pee right away.  Peeing small amounts often.  Pain or burning when peeing.  Blood in the pee.  Pee that smells bad or not like normal.  Trouble peeing.  Pee that is cloudy.  Fluid coming from the vagina, if you are  female.  Pain in the belly or lower back. Other symptoms include:  Vomiting.  Not feeling hungry.  Feeling mixed up (confused). This may be the first symptom in older adults.  Being tired and grouchy (irritable).  A fever.  Watery poop (diarrhea). How is this treated?  Taking antibiotic medicine.  Taking other medicines.  Drinking enough water. In some cases, you may need to see a specialist. Follow these instructions at home: Medicines  Take over-the-counter and prescription medicines only as told by your doctor.  If you were prescribed an antibiotic medicine, take it as told by your doctor. Do not stop taking it even if you start to feel better. General instructions  Make sure you: ? Pee until your bladder is empty. ? Do not hold pee for a long time. ? Empty your bladder after sex. ? Wipe from front to back after peeing or pooping if you are a female. Use each tissue one time when you wipe.  Drink enough fluid to keep your pee pale yellow.  Keep all follow-up visits.   Contact a doctor if:  You do not get better after 1-2 days.  Your symptoms go away and then come back. Get help right away if:  You have very bad back pain.  You have very bad pain in your lower belly.  You have a fever.  You have chills.  You feeling like you will vomit or you vomit. Summary  A urinary tract infection (UTI) is an infection of  any part of the urinary tract.  This condition is caused by germs in your genital area.  There are many risk factors for a UTI.  Treatment includes antibiotic medicines.  Drink enough fluid to keep your pee pale yellow. This information is not intended to replace advice given to you by your health care provider. Make sure you discuss any questions you have with your health care provider. Document Revised: 02/05/2020 Document Reviewed: 02/05/2020 Elsevier Patient Education  [employer].

## 2020-09-02 ENCOUNTER — Other Ambulatory Visit: Payer: Self-pay | Admitting: Orthopedic Surgery

## 2020-09-02 LAB — URINE CULTURE
MICRO NUMBER:: 11565091
SPECIMEN QUALITY:: ADEQUATE

## 2020-09-14 ENCOUNTER — Other Ambulatory Visit: Payer: Medicare Other

## 2020-09-14 ENCOUNTER — Other Ambulatory Visit: Payer: Self-pay

## 2020-09-14 VITALS — BP 122/66 | HR 71 | Temp 97.7°F | Wt 130.0 lb

## 2020-09-14 DIAGNOSIS — Z515 Encounter for palliative care: Secondary | ICD-10-CM

## 2020-09-14 NOTE — Progress Notes (Signed)
COMMUNITY PALLIATIVE CARE SW NOTE  PATIENT NAME: Kathleen Gallegos DOB: [birth year] MRN: [id number]  PRIMARY CARE PROVIDER: [name], NP  RESPONSIBLE PARTY:  Acct ID - Guarantor Home Phone Work Phone Relationship Acct Type  [id number] Kathleen Gallegos* [phone]  Self P/F     [place], [place], [place] [address]     PLAN OF CARE and INTERVENTIONS:             1. GOALS OF CARE/ ADVANCE CARE PLANNING: Patient is a DNR currently. Patient's middle daughter, Kathleen Gallegos, has guardianship of patient.    2.         SOCIAL/EMOTIONAL/SPIRITUAL ASSESSMENT/ INTERVENTIONS:  SW and RN met with patient and patient's daughters Kathleen Gallegos and Kathleen Gallegos. Daughters were all available for visit to discuss long term planning for patient.    Patient was recently seen by PCP due to UIT, patient received ABT for 7 days. Patient has had 2 falls since previous visit, and sustained no injuries. Daughter has been encouraging fluids and cranberry juice. Patient's weight continues to decline. Appetite continues to be poor.   Daughter, Kathleen Gallegos, shared that she is having more difficulties with patient due to her declining cognition. Per daughter, patient's confusion, argumentative lashes and combativeness continues to be the same. Daughter is having issues with providing hygiene care to patient.  SW discussed openly with patients daughters, long term planning for patient. VA benefits discussed with family and caregiver assistance with in home care. Medicaid and ALF placement also discussed in detail. Family discussion lasted a while and talked intensely about family dynamic s and roles of each daughter. Patient's daughter, Kathleen Gallegos, is experiencing extreme caregiver fatigue and ultimately would like to advance on placement for patient.    SW emailed information and applications for [employer] and VA pension application.Daughters to complete these applications and submit to appropriate agencies.  Daughters to discuss more  amongst themselves about whether 24/7 in home care versus ALF placement is the best option for patient. Daughters to outreach [facility] to obtain updated list of memory care ALF's.   RN checked vitals and reviewed medications.    SW discussed goals, reviewed care plan, provided emotional support, used active and reflective listening. Palliative care will continue to monitor and assist with long term care planning as needed.    3.         PATIENT/CAREGIVER EDUCATION/ COPING:  Patient alert with confusion. Patient's dementia is progressed. Patient enjoyed sewing and watching westerns. Patient has 3 children and a lot of grandchildren and great grandchildren. Daughter shares that she feels patient is having increased anxiety.  Continued caregiver support provided to daughter.   4.         PERSONAL EMERGENCY PLAN:  Family will call 9-1-1 for emergencies.    5.         COMMUNITY RESOURCES COORDINATION/ HEALTH CARE NAVIGATION:  Patient's daughter manages care and is primary caregiver.    6.         FINANCIAL/LEGAL CONCERNS/INTERVENTIONS: Patient on fixed income, daughter Kathleen Gallegos, is guardian over finances and person.        SOCIAL HX:  Social History   Tobacco Use  . Smoking status: Never Smoker  . Smokeless tobacco: Former Systems developer    Types: Snuff  Substance Use Topics  . Alcohol use: No    CODE STATUS: DNR ADVANCED DIRECTIVES: Y MOST FORM COMPLETE: N HOSPICE EDUCATION PROVIDED: N  PPS: Patient is independent with ambulating and feed self. Patient requires  assistance with meal prep, showering, dressing and toileting.     Time Spent: 2hrs   [name], LCSW

## 2020-09-14 NOTE — Progress Notes (Unsigned)
PATIENT NAME: Kathleen Gallegos DOB: 06-Dec-1925 MRN: [id number]  PRIMARY CARE PROVIDER: [name], NP  RESPONSIBLE PARTY:  Acct ID - Guarantor Home Phone Work Phone Relationship Acct Type  [id number] [name]* [phone]  Self P/F     [place], [place], [place] [address]    PLAN OF CARE and INTERVENTIONS:               1.  GOALS OF CARE/ ADVANCE CARE PLANNING: Daughter would prefer to have patient in ALF.               2.  PATIENT/CAREGIVER EDUCATION:  Private caregivers vs ALF               4. PERSONAL EMERGENCY PLAN:  Family will activate 911 for emergencies.               5.  DISEASE STATUS:  Joint visit completed with [name], SW and daughter [name].  Patient is found in her recliner chair.  She is pleasant and cooperative with Palliative Care staff.  [name] provided an update on patient's condition.  Sundowning is becoming more frequent. Patient is more argumentative with daughter.  She is requiring more cues to complete tasks such as flush the toliet, change her brief and change her clothes.  Patient is feeding herself and this is taking 30-60 minutes.  Patient was recently treated with antibiotics for a UTI.  Daughter noted strong odor and cloudy urine.  Daughter [name] reports difficulty with fluid intake.  She is keeping a cup beside patient but is requiring frequent cues to drink.   Daughters [name] and [name] arrived about 20 minutes after the start of this visit.  Discussion completed with all daughters regarding care for the patient.  Daughter [name] has voiced that she does not feel she can continue to provide care for her mother.  VA pension, [facility], and [place] application is discussed.  Daughter have each agreed to take a part in this process.  [name] will begin the process for VA Pension/Medicaid Form and Crystal will follow up with [facility].    HISTORY OF PRESENT ILLNESS:  87 year old female with Dementia.  Patient is being followed by  Palliative Care monthly and PRN.    CODE STATUS: Full  ADVANCED DIRECTIVES: No MOST FORM: No PPS: 50%   PHYSICAL EXAM:   VITALS: Today's Vitals   09/14/20 1003  BP: 122/66  Pulse: 71  Temp: 97.7 F (36.5 C)  SpO2: 97%  Weight: 130 lb (59 kg)  PainSc: 0-No pain    LUNGS: CTA, -for shortness of breath. CARDIAC: HRR EXTREMITIES: - for edema. SKIN: warm and dry to touch.  No skin breakdown. NEURO: alert to self and daughters.       Lorenza Burton, RN

## 2020-09-29 ENCOUNTER — Telehealth: Payer: Self-pay

## 2020-09-29 NOTE — Progress Notes (Signed)
09/27/2020 @1039  AM: Palliative Care SW sent a follow up email to patients daughters to follow up on key items discussed (medicaid application, VA pension application, and outreaching [facility]), and to offer assistance with questions/concerns if needed.

## 2020-09-29 NOTE — Telephone Encounter (Addendum)
1150 am.  Incoming call from [facility].  I have advised of patient meeting hospice eligibility.  Philosophy and services are explained.  [name] states she agrees with a hospice referral and believes this would be the best option at this time for her mother.  Advised that I have already spoken to [name] and will need to speak with Crystal before a referral could be requested.  [name] voiced understanding and has provided a contact number for [employer].  1216 pm.  Phone call made to [employer].  Message left requesting a call back. Response pending.   245 pm.  Incoming call from [employer].  Update provided on hospice eligibility.  Hospice philosophy and services are explained.  Advised of changes the Palliative Care team has seen since last year.  Crystal agrees with hospice referral and also believes this will be beneficial for her mother.  329 pm.  Phone call made to [facility] but no answer.  09/30/20 1055 am.  Phone call made to [name] to update her on her sisters decisions.  [name] is aware that both [place] are in agreeable with hospice care.  [name] also gives her approval.  Advised that I would contact PCP office for hospice referral and that once this was received the Intake Office would reach out to her to schedule a home visit for admission.  [name] voiced understanding.  09/30/20 1114 am Phone call made to PCP office and message has been left requesting a hospice referral for patient.  Response pending.

## 2020-09-29 NOTE — Telephone Encounter (Signed)
1015 am.  Phone call made to [name] to follow up on hospice approval.  Advised that I have reviewed the case with Dr. Cleon Gustin and patient does meet qualifications for hospice services. I have explained hospice philosophy and services.  [name] is hesitant to have these services due to [place].  I have explained the screening process that is in place for employees and PPE requirements for hospice staff.  [name] has requested that I speak with her sisters regarding this.  All three sisters hold guardianship over their mother and thus all would need to give approval for services.   1107 am.  Phone call made to Della Goo and messages have been left on both cell and home numbers.  Awaiting call back.

## 2020-09-30 ENCOUNTER — Telehealth: Payer: Self-pay

## 2020-09-30 DIAGNOSIS — F0391 Unspecified dementia with behavioral disturbance: Secondary | ICD-10-CM

## 2020-09-30 NOTE — Telephone Encounter (Signed)
Incoming call received from [facility] stating then need referral order faxed to them and they also need to know if Webb Silversmith will be the attending physician for patient.

## 2020-09-30 NOTE — Telephone Encounter (Signed)
Vance Gather with Palliative Care called and left message on clinical intake phone regarding Hospice referral. She says that patient qualifies for hospice care and would like to know if you agree to hospice referral and if you would continue to follow patient. She says she spoke with all of patient's daughters and they all agree to hospice care. Please advise  Message routed to Marlowe Sax, NP

## 2020-09-30 NOTE — Telephone Encounter (Signed)
May refer to Hospice care as requested.will continue to follow up as PCP but if it's easier for family to follow up with Hospice provider that will be fine with me.

## 2020-09-30 NOTE — Telephone Encounter (Signed)
Referral to Hospice ordered.will continue to be PCP if desired by POA

## 2020-10-01 ENCOUNTER — Other Ambulatory Visit: Payer: Self-pay | Admitting: Family

## 2020-10-01 DIAGNOSIS — F0391 Unspecified dementia with behavioral disturbance: Secondary | ICD-10-CM

## 2020-10-01 DIAGNOSIS — F411 Generalized anxiety disorder: Secondary | ICD-10-CM

## 2020-10-03 ENCOUNTER — Telehealth: Payer: Self-pay

## 2020-10-03 NOTE — Telephone Encounter (Signed)
Hospice Nurse to collect urine specimen for U/A and C/s rule out UTI

## 2020-10-03 NOTE — Telephone Encounter (Signed)
I called and left a verbal message with representative for [facility], informing them that Webb Silversmith will be the attending.

## 2020-10-03 NOTE — Telephone Encounter (Signed)
Patients daughter called c/o possible UrinaryTract Infection (UTI)  1. What symptoms are you having (frequency, urgency, dysuria, incontinence, confusion)? Subtle combative behavior changes  and wetting depend a little more than usual    2. Any fever or chills? No  3. Any suprapubic pain? No   4. Have you taken anything OTC for symptoms? No, tried cranberry supplement x a couple of days, patient resistant to take  5. How much water are you drinking daily? 1-2 cups per day, daughter struggles to get mother to drink water.   6. How long have you had your symptoms (onset)? Off/on since November 2021  Patients daughter also states patient sits up at night and she has to tell her to go to bed. Patient will be evaluated by Hospice tomorrow and her daughter questions if they can have an order to check a urine specimen tomorrow.   I will forward this information to your provider and call with instructions, if your symptoms persist or progress seek medical attention at your nearest urgent care or emergency room. Patient verbalized understanding.

## 2020-10-04 NOTE — Telephone Encounter (Signed)
Orders routed electronically to [facility]   Per verbal conversation with Kathyrn Lass (referral coordinator) the above was the exact location patient was referred to.   To recall routing history go to misc rpts and select chart review:routing history

## 2020-10-05 NOTE — Telephone Encounter (Signed)
Noted  

## 2020-10-05 NOTE — Telephone Encounter (Signed)
Daughter, [name], called and stated that patient is wetting cloths and combative. Stated that she believes she has another UTI. Stated that she called on Monday but no one ever called her back and she wasn't sure what was going on.  I instructed her that an order has been sent to Hospice to collect a urine for culture. She stated that she spoke with Ebony Hail [phone] last night around 11:30pm and she did not have an order.   I called Ebony Hail with Hospice [phone] and [facility] to return call to give verbal order to collect urine specimen for U/A and C/S to rule out UTI.

## 2020-10-05 NOTE — Telephone Encounter (Signed)
Kathleen Gallegos with Hospice called and stated that patient does NOT qualify for Hospice Services. Stated that patient is too High Functional to Qualify.   Stated that she is not admitted to their services.   Spoke with daughter, [name], and she stated that she knew that. I told her that patient would not be able to get the urine done by Hospice since she did not qualify for their services.  Appointment scheduled in office for patient to be evaluated.

## 2020-10-06 ENCOUNTER — Telehealth: Payer: Self-pay

## 2020-10-06 NOTE — Telephone Encounter (Signed)
Incoming call received from patients daughter requesting advice on what to do with her mother.  Patient with combative behavior, refusing to drink water, sweating, smells rotten, has dirty depend on and was actively yelling in the back ground. [name] thinks patient has an infection. [name] feels like she can not do this anymore. [name] is looking for direction and guidance on getting her mother placed somewhere as she can not keep caring for her.  [name] is patients Guardian

## 2020-10-06 NOTE — Telephone Encounter (Signed)
Please send patient to ED if not eating or drinking and increase combative behavior for evaluation.consider placement in skilled facility.

## 2020-10-06 NOTE — Telephone Encounter (Signed)
I called Kathleen Gallegos and relayed [name]'s message. Kathleen Gallegos explained how she had a bad experience at [facility] as the staff was unprofessional, Kathleen Gallegos may consider taking her mother to [facility].  I called [name] and left a detailed message about this encounter and [name]'s recommendations   I will follow-up with Kathleen Gallegos in the morning

## 2020-10-07 ENCOUNTER — Encounter: Payer: Self-pay | Admitting: Family

## 2020-10-07 ENCOUNTER — Other Ambulatory Visit: Payer: Self-pay

## 2020-10-07 ENCOUNTER — Ambulatory Visit (INDEPENDENT_AMBULATORY_CARE_PROVIDER_SITE_OTHER): Payer: Medicare Other | Admitting: Family

## 2020-10-07 VITALS — BP 120/76 | HR 74 | Temp 97.9°F | Resp 20 | Ht 66.0 in | Wt 130.6 lb

## 2020-10-07 DIAGNOSIS — R399 Unspecified symptoms and signs involving the genitourinary system: Secondary | ICD-10-CM | POA: Diagnosis not present

## 2020-10-07 LAB — POCT URINALYSIS DIPSTICK
Bilirubin, UA: NEGATIVE
Glucose, UA: NEGATIVE
Nitrite, UA: NEGATIVE
Protein, UA: POSITIVE — AB
Spec Grav, UA: 1.01 (ref 1.010–1.025)
Urobilinogen, UA: NEGATIVE E.U./dL — AB
pH, UA: 5 (ref 5.0–8.0)

## 2020-10-07 NOTE — Patient Instructions (Signed)
Urine specimen send for urine culture will call you in 3 days with results.

## 2020-10-07 NOTE — Progress Notes (Signed)
Provider: Marlowe Sax FNP-C  Rett Stehlik, Nelda Bucks, NP  Patient Care Team: Brynne Doane, Nelda Bucks, NP as PCP - General (Family Medicine) Whitney Muse, Kelby Fam, MD (Inactive) as Consulting Physician (Hematology and Oncology) Rutherford Guys, MD as Consulting Physician (Ophthalmology) Delice Lesch Lezlie Octave, MD as Consulting Physician (Neurology)  Extended Emergency Contact Information Primary Emergency Contact: Mikey College Address: [address]          [place], [place] [address] Johnnet[place] L[place] of [place] Phone: [phone] Mobile Phone: [phone] Relation: Daughter Secondary Emergency Contact: [place] Phone: [phone] Mobile Phone: [phone] Relation: Daughter  Code Status:  Full Code  Goals of care: Advanced Directive information Advanced Directives 10/07/2020  Does Patient Have a Medical Advance Directive? No  Type of Advance Directive -  Does patient want to make changes to medical advance directive? No - Patient declined  Copy of [place] in Chart? -  Would patient like information on creating a medical advance directive? -     Chief Complaint  Patient presents with  . Acute Visit    Possible UTI    HPI:  Pt is a 87 y.o. female seen today for an acute visit for evaluation of possible urinary infection.Has not been drinking water.daughter fills 16 oz of water at least twice daily.No fever or chills.denies any lower abdomen.Appetite is good.comabtive towards daughter sometimes.Listens to other care givers daughter thinks has another Urinary tract infection.  She was evaluated for Hospice service but did not qualify.Has upcoming appointment with social worker to re-evaluate.  Her weight is stable thought daughter states not eating and drinking water. Main issues has been getting to shower and wash her hair which is matted.  More talkative today and involved in answering questions appropriate and most of the time disagreeing with whatever her  daughter [name] says.     Past Medical History:  Diagnosis Date  . Allergic rhinitis, cause unspecified   . Angioneurotic edema not elsewhere classified   . Anxiety disorder   . Asthma   . Carotid bruit   . Carpal tunnel syndrome   . Chronic abdominal pain   . Fecal incontinence   . GERD (gastroesophageal reflux disease)   . Hyperlipidemia   . IBS (irritable bowel syndrome)   . Insomnia   . Lung nodule   . Osteoarthritis   . Palpitations   . Pancytopenia ([place])   . Senile osteoporosis    Past Surgical History:  Procedure Laterality Date  . ABDOMINAL HYSTERECTOMY  1979   fibroids   . bilateral cataract surgery  2008  . BIOPSY THYROID  1970   . COLONOSCOPY  12/21/04   normal -redundant   . EUS  08/07/07  . EYE SURGERY      Allergies  Allergen Reactions  . Diphenhydramine Hcl Other (See Comments)    unknown  . Rivastigmine Tartrate Palpitations    Outpatient Encounter Medications as of 10/07/2020  Medication Sig  . donepezil (ARICEPT) 10 MG tablet TAKE 1 TABLET BY MOUTH AT BEDTIME  . fexofenadine (ALLEGRA) 180 MG tablet Take 180 mg by mouth as needed.  . fluticasone (FLONASE) 50 MCG/ACT nasal spray Place 1 spray into both nostrils as needed.  . Melatonin 10 MG TABS Take 10 mg by mouth daily.  . metoprolol succinate (TOPROL-XL) 25 MG 24 hr tablet Take one tablet by mouth once daily.  . polyethylene glycol powder (GLYCOLAX/MIRALAX) 17 GM/SCOOP powder Take 17 g by mouth daily. Hold for loose stool  . sertraline (ZOLOFT) 50  MG tablet Take 1 tablet by mouth once daily  . vitamin C (ASCORBIC ACID) 500 MG tablet Take 500 mg by mouth daily. (Patient not taking: Reported on 10/07/2020)   No facility-administered encounter medications on file as of 10/07/2020.    Review of Systems  Constitutional: Negative for appetite change, chills and fatigue.  Respiratory: Negative for cough, chest tightness, shortness of breath and wheezing.   Cardiovascular: Negative for chest pain,  palpitations and leg swelling.  Gastrointestinal: Negative for abdominal distention, abdominal pain, constipation, diarrhea, nausea and vomiting.  Genitourinary: Negative for difficulty urinating, dysuria, flank pain and urgency.       Decreased use of the bathroom Cloudy urine    Psychiatric/Behavioral: Negative for behavioral problems and sleep disturbance. The patient is not nervous/anxious.        Combative     Immunization History  Administered Date(s) Administered  . Fluad Quad(high Dose 65+) 03/25/2019, 04/29/2020  . Influenza Split 03/27/2011, 04/09/2012  . Influenza Whole 03/31/2007, 03/29/2009, 04/12/2009, 03/14/2010  . Influenza, High Dose Seasonal PF 04/30/2018  . Influenza,inj,Quad PF,6+ Mos 03/31/2013, 04/07/2014, 03/08/2015, 02/23/2016, 06/06/2016, 06/12/2017  . Moderna Sars-Covid-2 Vaccination 09/12/2019, 10/14/2019  . PPD Test 06/12/2017, 12/22/2018  . Pneumococcal Conjugate-13 08/05/2014  . Pneumococcal Polysaccharide-23 02/11/2006  . Td 04/18/2005   Pertinent  Health Maintenance Due  Topic Date Due  . INFLUENZA VACCINE  02/06/2021  . DEXA SCAN  Completed  . PNA vac Low Risk Adult  Completed   Fall Risk  10/07/2020 05/27/2020 04/29/2020 04/08/2020 10/13/2019  Falls in the past year? 1 0 - 1 1  Number falls in past yr: 1 0 1 0 0  Injury with Fall? 1 0 0 0 0  Risk for fall due to : - - - - -   Functional Status Survey:    Vitals:   10/07/20 1417  BP: 120/76  Pulse: 74  Resp: 20  Temp: 97.9 F (36.6 C)  TempSrc: Temporal  SpO2: 97%  Weight: 130 lb 9.6 oz (59.2 kg)  Height: 5\' 6"  (1.676 m)   Body mass index is 21.08 kg/m. Physical Exam Vitals reviewed.  Constitutional:      General: She is not in acute distress.    Appearance: She is normal weight. She is not ill-appearing.  HENT:     Head: Normocephalic.  Eyes:     General: No scleral icterus.       Right eye: No discharge.        Left eye: No discharge.     Conjunctiva/sclera: Conjunctivae  normal.     Pupils: Pupils are equal, round, and reactive to light.  Neck:     Vascular: No carotid bruit.  Cardiovascular:     Rate and Rhythm: Normal rate and regular rhythm.     Pulses: Normal pulses.     Heart sounds: Normal heart sounds. No murmur heard. No friction rub. No gallop.   Pulmonary:     Effort: Pulmonary effort is normal. No respiratory distress.     Breath sounds: Normal breath sounds. No wheezing, rhonchi or rales.  Chest:     Chest wall: No tenderness.  Abdominal:     General: Bowel sounds are normal. There is no distension.     Palpations: Abdomen is soft. There is no mass.     Tenderness: There is no abdominal tenderness. There is no right CVA tenderness, left CVA tenderness or rebound.  Musculoskeletal:        General: No swelling or tenderness. Normal  range of motion.     Cervical back: Normal range of motion. No rigidity or tenderness.     Right lower leg: No edema.     Left lower leg: No edema.  Lymphadenopathy:     Cervical: No cervical adenopathy.  Skin:    General: Skin is warm and dry.     Coloration: Skin is not pale.     Findings: No bruising, erythema or rash.  Neurological:     Mental Status: She is alert. Mental status is at baseline.     Cranial Nerves: No cranial nerve deficit.     Motor: No weakness.     Gait: Gait normal.  Psychiatric:        Mood and Affect: Mood normal.        Speech: Speech normal.        Behavior: Behavior normal.        Thought Content: Thought content normal.        Cognition and Memory: Memory is impaired.        Judgment: Judgment normal.     Labs reviewed: Recent Labs    04/08/20 1543 05/16/20 1815 05/27/20 1619  NA 137 136 137  K 4.1 3.9 3.8  CL 102 102 101  CO2 27 25 28   GLUCOSE 92 96 107  BUN 14 20 18   CREATININE 0.98* 0.85 0.92*  CALCIUM 9.7 9.2 9.5   Recent Labs    04/08/20 1543 05/16/20 1815  AST 16 20  ALT 7 14  ALKPHOS  --  35*  BILITOT 0.6 0.6  PROT 7.9 7.9  ALBUMIN  --  3.8    Recent Labs    04/08/20 1543 05/16/20 1815 05/27/20 1619  WBC 4.9 5.0 5.5  NEUTROABS 2,656 2.6 3,944  HGB 11.5* 11.1* 11.8  HCT 34.0* 34.2* 35.2  MCV 95.8 100.6* 97.2  PLT 143 130* 137*   Lab Results  Component Value Date   TSH 3.44 04/08/2020   No results found for: HGBA1C Lab Results  Component Value Date   CHOL 217 (H) 04/08/2020   HDL 70 04/08/2020   LDLCALC 130 (H) 04/08/2020   TRIG 77 04/08/2020   CHOLHDL 3.1 04/08/2020    Significant Diagnostic Results in last 30 days:  No results found.  Assessment/Plan  Symptoms of urinary tract infection Afebrile. - POC Urinalysis Dipstick indicates yellow very cloudy urine with large amount of  Blood and leukocytes but negative for nitrites. Made aware will send urine for culture then will call with results in 3 days. - advised to notify provider if symptoms worsen ,running any fever or chills - Increased water intake to 6-8 glasses daily  - Urine Culture  Family/ staff Communication: Reviewed plan of care with patient and daughter [name]   Labs/tests ordered:  - POC Urinalysis Dipstick - Urine Culture  Next Appointment:As needed if symptoms worsen or fail to improve     [name], NP

## 2020-10-09 LAB — URINE CULTURE
MICRO NUMBER:: 11724607
SPECIMEN QUALITY:: ADEQUATE

## 2020-10-10 MED ORDER — CIPROFLOXACIN HCL 500 MG PO TABS: 500.0000 mg | ORAL_TABLET | Freq: Two times a day (BID) | ORAL | 0 refills | Status: AC

## 2020-10-10 NOTE — Telephone Encounter (Signed)
-----   Message from [name], NP sent at 10/10/2020  9:44 AM EDT ----- Urine culture results positive for urinary tract infection start on Cipro 500 mg tablet one by mouth twice daily for 7 days.take along with probiotic Florastor 250 mg capsule one by mouth twice daily x 10 days to prevent antibiotics associated diarrhea.

## 2020-10-10 NOTE — Telephone Encounter (Signed)
Discussed results with patients daughter Pricilla Larsson verbalized understanding of results and states she has a probiotic, Align to take.   Rx for Cipro sent to pharmacy

## 2020-10-17 ENCOUNTER — Telehealth: Payer: Self-pay | Admitting: *Deleted

## 2020-10-17 NOTE — Telephone Encounter (Signed)
Has taken all of them but #3. Did not get one dose yesterday and none given today. Today would have been the last day.

## 2020-10-17 NOTE — Telephone Encounter (Signed)
LMOM to return call.

## 2020-10-17 NOTE — Telephone Encounter (Signed)
[name] called and stated that she thinks patient is having a reaction to the Cipro. Stated that patient's Hallucinations are strong for the last 3 days and having increase confusion. Stated that patient is pacing and not going to bed. Stated that she sees changes in patient's behavior.  Stated patient keeps stating that she is going home. No other symptoms noted.   Patient is only getting about 16oz of fluid a day.   Please Advise.

## 2020-10-17 NOTE — Telephone Encounter (Signed)
Did hallucination get better with stopping medication? Pacing could be dementia verse medication.

## 2020-10-17 NOTE — Telephone Encounter (Signed)
How many days has she taken the Cipro.i suspect confusion more from urinary tract infection than the Cipro.

## 2020-10-19 ENCOUNTER — Other Ambulatory Visit: Payer: Medicare Other

## 2020-10-25 ENCOUNTER — Ambulatory Visit: Payer: Medicare Other | Admitting: Family

## 2020-10-26 ENCOUNTER — Other Ambulatory Visit: Payer: Medicare Other

## 2020-10-26 ENCOUNTER — Ambulatory Visit: Payer: Medicare Other | Admitting: Family

## 2020-10-27 ENCOUNTER — Telehealth: Payer: Self-pay

## 2020-10-27 NOTE — Telephone Encounter (Signed)
2:30PM: Palliative care SW outreached patient to complete telephonic visit.   Palliative care SW outreached patient to complete telephonic visit. Patient daughter, [name], provided update on medical condition and/or changes. Daughter shared that patient has declined slightly, cognitively. Daughter shared that patient continues combative with care at times. Patient was treated with ABT for UTI recently. Daughter shares that patient seemed to be having hallucinations due to ABT and/or UTI. Daughter denies any complaints of pains. No recent falls noted. Patient does not use any AD for mobility, however daughter who is primary caregiver, is present for standby supervision if needed.   Patients appetite has been poor and has poor fluid intake.  Daughter shared that she was looking for update of hospice referral for patient as she has not heard anything after the initial hospice referral. Daughter shared that [place] with Hospice, Ebony Hail, came to do the admission, but did not share with her the decision. SW informed daughter that hospice stated that patient does not qualify for hospice services to due to exceeding the life expectancy of 6 months. Daughter appreciated the update.  Daughter shares that patient is holding medications and food in her mouth at times. Daughter continues to struggle with caregiver fatigue and burnout. SW inquired about follow up of emails from [place] on completing [employer] pension application, Medicaid application and connecting with [facility] for placement assistance. Daughter shares that no progress has been made.  Daughter denies financial, food insecurities or issues with transportation. No other psychosocial needs. Patient states that she feels she has been doing well. Daughter appreciative of telephonic check in.   Palliative care will continue to monitor and assist with long term care planning as needed. In person visit scheduled for 5/2 @12 :30.

## 2020-11-01 ENCOUNTER — Ambulatory Visit: Payer: Medicare Other | Admitting: Family

## 2020-11-07 ENCOUNTER — Other Ambulatory Visit: Payer: Medicare Other

## 2020-11-07 ENCOUNTER — Other Ambulatory Visit: Payer: Self-pay

## 2020-11-07 ENCOUNTER — Other Ambulatory Visit: Payer: Self-pay | Admitting: Family

## 2020-11-07 DIAGNOSIS — F0391 Unspecified dementia with behavioral disturbance: Secondary | ICD-10-CM

## 2020-11-07 DIAGNOSIS — F411 Generalized anxiety disorder: Secondary | ICD-10-CM

## 2020-11-14 ENCOUNTER — Other Ambulatory Visit: Payer: Self-pay | Admitting: Family

## 2020-11-14 DIAGNOSIS — R002 Palpitations: Secondary | ICD-10-CM

## 2020-11-15 ENCOUNTER — Encounter: Payer: Self-pay | Admitting: Adult Health

## 2020-11-15 ENCOUNTER — Other Ambulatory Visit: Payer: Medicare Other

## 2020-11-15 ENCOUNTER — Other Ambulatory Visit: Payer: Self-pay

## 2020-11-15 ENCOUNTER — Ambulatory Visit (INDEPENDENT_AMBULATORY_CARE_PROVIDER_SITE_OTHER): Payer: Medicare Other | Admitting: Adult Health

## 2020-11-15 VITALS — BP 120/88 | HR 72 | Temp 96.9°F | Resp 16 | Ht 66.0 in | Wt 127.4 lb

## 2020-11-15 DIAGNOSIS — B999 Unspecified infectious disease: Secondary | ICD-10-CM | POA: Diagnosis not present

## 2020-11-15 DIAGNOSIS — J301 Allergic rhinitis due to pollen: Secondary | ICD-10-CM | POA: Diagnosis not present

## 2020-11-15 DIAGNOSIS — I10 Essential (primary) hypertension: Secondary | ICD-10-CM

## 2020-11-15 DIAGNOSIS — Z9181 History of falling: Secondary | ICD-10-CM

## 2020-11-15 DIAGNOSIS — K5901 Slow transit constipation: Secondary | ICD-10-CM | POA: Diagnosis not present

## 2020-11-15 DIAGNOSIS — F411 Generalized anxiety disorder: Secondary | ICD-10-CM | POA: Diagnosis not present

## 2020-11-15 DIAGNOSIS — R399 Unspecified symptoms and signs involving the genitourinary system: Secondary | ICD-10-CM

## 2020-11-15 DIAGNOSIS — F0391 Unspecified dementia with behavioral disturbance: Secondary | ICD-10-CM

## 2020-11-15 DIAGNOSIS — R410 Disorientation, unspecified: Secondary | ICD-10-CM | POA: Diagnosis not present

## 2020-11-15 DIAGNOSIS — E785 Hyperlipidemia, unspecified: Secondary | ICD-10-CM

## 2020-11-15 LAB — POCT URINALYSIS DIPSTICK
Bilirubin, UA: NEGATIVE
Glucose, UA: NEGATIVE
Nitrite, UA: NEGATIVE
Protein, UA: POSITIVE — AB
Spec Grav, UA: >=1.03 — AB (ref 1.010–1.025)
Urobilinogen, UA: NEGATIVE E.U./dL — AB
pH, UA: 5 (ref 5.0–8.0)

## 2020-11-15 MED ORDER — CIPROFLOXACIN HCL 500 MG PO TABS: 500.0000 mg | ORAL_TABLET | Freq: Two times a day (BID) | ORAL | 0 refills | Status: AC
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
## 2020-11-15 NOTE — Patient Instructions (Addendum)
https://point-of-care.elsevierperformancemanager.com/skills">  Dementia Caregiver Guide Dementia is a term used to describe a number of symptoms that affect memory and thinking. The most common symptoms include:  Memory loss.  Trouble with language and communication.  Trouble concentrating.  Poor judgment and problems with reasoning.  Wandering from home or public places.  Extreme anxiety or depression.  Being suspicious or having angry outbursts and accusations.  Child-like behavior and language. Dementia can be frightening and confusing. And taking care of someone with dementia can be challenging. This guide provides tips to help you when providing care for a person with dementia. How to help manage lifestyle changes Dementia usually gets worse slowly over time. In the early stages, people with dementia can stay independent and safe with some help. In later stages, they need help with daily tasks such as dressing, grooming, and using the bathroom. There are actions you can take to help a person manage his or her life while living with this condition. Communicating  When the person is talking or seems frustrated, make eye contact and hold the person's hand.  Ask specific questions that need yes or no answers.  Use simple words, short sentences, and a calm voice. Only give one direction at a time.  When offering choices, limit the person to just one or two.  Avoid correcting the person in a negative way.  If the person is struggling to find the right words, gently try to help him or her. Preventing injury  Keep floors clear of clutter. Remove rugs, magazine racks, and floor lamps.  Keep hallways well lit, especially at night.  Put a handrail and nonslip mat in the bathtub or shower.  Put childproof locks on cabinets that contain dangerous items, such as medicines, alcohol, guns, toxic cleaning items, sharp tools or utensils, matches, and lighters.  For doors to the  outside of the house, put the locks in places where the person cannot see or reach them easily. This will help ensure that the person does not wander out of the house and get lost.  Be prepared for emergencies. Keep a list of emergency phone numbers and addresses in a convenient area.  Remove car keys and lock garage doors so that the person does not try to get in the car and drive.  Have the person wear a bracelet that tracks locations and identifies the person as having memory problems. This should be worn at all times for safety.   Helping with daily life  Keep the person on track with his or her routine.  Try to identify areas where the person may need help.  Be supportive, patient, calm, and encouraging.  Gently remind the person that adjusting to changes takes time.  Help with the tasks that the person has asked for help with.  Keep the person involved in daily tasks and decisions as much as possible.  Encourage conversation, but try not to get frustrated if the person struggles to find words or does not seem to appreciate your help.   How to recognize stress Look for signs of stress in yourself and in the person you are caring for. If you notice signs of stress, take steps to manage it. Symptoms of stress include:  Feeling anxious, irritable, frustrated, or angry.  Denying that the person has dementia or that his or her symptoms will not improve.  Feeling depressed, hopeless, or unappreciated.  Difficulty sleeping.  Difficulty concentrating.  Developing stress-related health problems.  Feeling like you have too little time  for your own life. Follow these instructions at home: Take care of your health Make sure that you and the person you are caring for:  Get regular sleep.  Exercise regularly.  Eat regular, nutritious meals.  Take over-the-counter and prescription medicines only as told by your health care providers.  Drink enough fluid to keep your urine pale  yellow.  Attend all scheduled health care appointments.   General instructions  Join a support group with others who are caregivers.  Ask about respite care resources. Respite care can provide short-term care for the person so that you can have a regular break from the stress of caregiving.  Consider any safety risks and take steps to avoid them.  Organize medicines in a pill box for each day of the week.  Create a plan to handle any legal or financial matters. Get legal or financial advice if needed.  Keep a calendar in a central location to remind the person of appointments or other activities. Where to find support: Many individuals and organizations offer support. These include:  Support groups for people with dementia.  Support groups for caregivers.  Counselors or therapists.  Home health care services.  Adult day care centers. Where to find more information  Centers for Disease Control and Prevention: http://www.wolf.info/  Alzheimer's Association: [place].co.nz  Family Caregiver Alliance: www.caregiver.[place]: www.alzfdn.org Contact a health care provider if:  The person's health is rapidly getting worse.  You are no longer able to care for the person.  Caring for the person is affecting your physical and emotional health.  You are feeling depressed or anxious about caring for the person. Get help right away if:  The person threatens himself or herself, you, or anyone else.  You feel depressed or sad, or feel that you want to harm yourself. If you ever feel like your loved one may hurt himself or herself or others, or if he or she shares thoughts about taking his or her own life, get help right away. You can go to your nearest emergency department or:  Call your local emergency services (911 in the [place]  Call a suicide crisis helpline, such as the [place] at 914-680-2487. This is open 24 hours a day in  the [place]  Text the Crisis Text Line at 773-264-0673 (in the [place].). Summary  Dementia is a term used to describe a number of symptoms that affect memory and thinking.  Dementia usually gets worse slowly over time.  Take steps to reduce the person's risk of injury and to plan for future care.  Caregivers need support, relief from caregiving, and time for their own lives. This information is not intended to replace advice given to you by your health care provider. Make sure you discuss any questions you have with your health care provider. Document Revised: 11/09/2019 Document Reviewed: 11/09/2019 Elsevier Patient Education  [place].

## 2020-11-16 ENCOUNTER — Ambulatory Visit: Payer: Medicare Other | Admitting: Family

## 2020-11-16 LAB — URINE CULTURE
MICRO NUMBER:: 11872538
SPECIMEN QUALITY:: ADEQUATE

## 2020-11-17 NOTE — Progress Notes (Signed)
We can collect it after her antibiotic (if she is still not getting any better) and she needs to have a good periwash before the urine collection.

## 2020-11-17 NOTE — Progress Notes (Signed)
We can repeat the Urine culture then.

## 2020-11-18 ENCOUNTER — Other Ambulatory Visit: Payer: Medicare Other

## 2020-11-18 ENCOUNTER — Other Ambulatory Visit: Payer: Self-pay

## 2020-11-18 VITALS — BP 120/64 | HR 55 | Temp 97.2°F | Resp 20

## 2020-11-18 DIAGNOSIS — Z515 Encounter for palliative care: Secondary | ICD-10-CM

## 2020-11-18 NOTE — Progress Notes (Signed)
COMMUNITY PALLIATIVE CARE SW NOTE  PATIENT NAME: Kathleen Gallegos DOB: 1925/09/16 MRN: [id number]  PRIMARY CARE PROVIDER: [name], NP  RESPONSIBLE PARTY:  Acct ID - Guarantor Home Phone Work Phone Relationship Acct Type  [id number] [name]* [phone]  Self P/F     [address], [place], [place] [address]     PLAN OF CARE and INTERVENTIONS:             1. GOALS OF CARE/ ADVANCE CARE PLANNING:  Patient is a DNR currently. Patient's middle daughter, Kathleen Gallegos, has guardianship of patient.    2.         SOCIAL/EMOTIONAL/SPIRITUAL ASSESSMENT/ INTERVENTIONS:  SW and RN met with patient and patient's daughters Kathleen Gallegos and Kathleen Gallegos. Daughters were all available for visit to discuss long term planning for patient.    Patient continues with advancing dementia. Patient was recently seen by PCP due to UTI symptoms, patient received ABT for 7 days. This has been ongoing for the past few months. Daughter called EMT this past Sunday 5/8 due to patient exhibiting abnormal non combative behaviors. Daughter shared that patient was constipated on Sunday as well. EMT shared that all vitals were fine, patient was not transported to hospital. Patient has had 1 fall since previous visit, and sustained no injuries. Daughter has been encouraging fluids and cranberry juice. Patient's weight continues to decline. Appetite continues to be poor.    Daughter, Kathleen Gallegos, shares that she continues having more difficulty with patient due to her declining cognition. Per daughter, patient's confusion, argumentative lashes and combativeness continues to be the present. Daughter is having issues with providing hygiene care to patient.  Daughter shared that she has spoken with old friends/contacts, Kathleen Gallegos, about possible caregiver support for patient to assist with bathing every now and then. SW shared respite program information with daughter: [facility] 9807684163. SW also applied for respite support through  [facility] program for possible support.  SW discussed patient possibly re-joining the [facility] adult day program.    SW continues to highly encourage daughter and her siblings to complete all applications (VA pension and Medicaid application) for patient to receive assistance in the home as well as begin placement process for patient. Daughter stated understanding and will be following up with family again.    RN checked vitals and reviewed medications.    SW discussed goals, reviewed care plan, provided emotional support, used active and reflective listening. Palliative care will continue to monitor and assist with long term care planning as needed.    3.         PATIENT/CAREGIVER EDUCATION/ COPING:  Patient alert with confusion. Patient's dementia is progressed. Patient enjoyed sewing and watching westerns. Patient has 3 children and a lot of grandchildren and great grandchildren. Daughter shares that she feels patient is having increased anxiety.  Continued caregiver support provided to daughter.   4.         PERSONAL EMERGENCY PLAN:  Family will call 9-1-1 for emergencies.    5.         COMMUNITY RESOURCES COORDINATION/ HEALTH CARE NAVIGATION:  Patient's daughter manages care and is primary caregiver.    6.         FINANCIAL/LEGAL CONCERNS/INTERVENTIONS: Patient on fixed income, daughter Kathleen Gallegos, is guardian over finances and person.        SOCIAL HX:  Social History   Tobacco Use  . Smoking status: Never Smoker  . Smokeless tobacco: Former Systems developer    Types: Snuff  Substance  Use Topics  . Alcohol use: No    CODE STATUS: DNR  ADVANCED DIRECTIVES: Y MOST FORM COMPLETE:  Y HOSPICE EDUCATION PROVIDED: N  PPS: Patient is ambulatory w/o AD but is MIN-A with ADL's.  Time spent: 1 hr 30 min       [name], [place]

## 2020-11-21 NOTE — Progress Notes (Signed)
PATIENT NAME: Kathleen Gallegos DOB: May 04, 1926 MRN: [id number]  PRIMARY CARE PROVIDER: [name], NP  RESPONSIBLE PARTY:  Acct ID - Guarantor Home Phone Work Phone Relationship Acct Type  [id number] [name]* [phone]  Self P/F     [address], [place], [place] [address]    PLAN OF CARE and INTERVENTIONS:               1.  GOALS OF CARE/ ADVANCE CARE PLANNING:  Long term goals would be placement into a facility but for now remain home under the care of her daughter.                2.  PATIENT/CAREGIVER EDUCATION:  UTI and Dementia               4. PERSONAL EMERGENCY PLAN:  Activate 911 for emergencies.               5.  DISEASE STATUS:  Joint visit with [name], SW, [name]-daughter and patient.   Daughter provides an update on the patient since our last visit.  Patient was evaluated for hospice services but did not qualify at that time.  Patient has been seen by PCP for treatment of a UTI.  Patient continues to have issues with urinary incontinence that are worsening. Patient will often sit in a saturated depend and daughter often has to remind patient to change.  Po intake is minimal.  Daughter feels about 17 oz a day are being consumed.  She has a mug beside her and needs frequent cues to drink.  Further discussion completed on UTI and worsening urinary incontinence related to dementia.  EMS saw patient on Sunday.  Daughter reported patient was holding onto her dresser trembling in her legs.  Daughter was uncertain as to what was going on with patient and activated 911.  Issues resolved prior to EMS arrival.  Patient was assessed and found to be in stable condition.  After their departure, patient had a large bowel movement.  Daughter noted patient was constipated.  We discussed decrease po intake could also cause issues with constipation.  Importance of increase fluid intake and fiber intake.  Daughter is now giving patient a benefiber daily and miralax 3 x daily.   Daughter  is now providing more of a meal for breakfast rather than just cereal.  Due to occasional issues with sundowning, dinner is becoming more difficult for patient to eat.   This is working better for patient.   Patient is having a harder time getting out of her chair.  Daughter has observed that the longer patient sits the harder it is for to stand and she requires assistance.  Patient is able to stand from a sitting position and ambulate throughout the house with me.  Gait is fairly stable at this time.  Daughter reports a fall last month without injury.  Daughter is seeking additional assistance with personal care.  She has spoken to acquaintances and is working to establish this service.      HISTORY OF PRESENT ILLNESS:  87 year old female with Dementia.  Patient is being followed by Palliative Care monthly and PRN.  CODE STATUS: Full ADVANCED DIRECTIVES: No MOST FORM: No PPS: 50%   PHYSICAL EXAM:   VITALS: Today's Vitals   11/21/20 0851  BP: 120/64  Pulse: (!) 55  Resp: 20  Temp: (!) 97.2 F (36.2 C)  SpO2: 95%  PainSc: 0-No pain    LUNGS: CTA, no  wheezes or shortness of breath. CARDIAC: HRR EXTREMITIES: - for edema SKIN: dry and flaky skin.  No breakdown present per daughter. NEURO: alert to self + for confusion       Lorenza Burton, RN

## 2020-12-02 DIAGNOSIS — Z23 Encounter for immunization: Secondary | ICD-10-CM | POA: Diagnosis not present

## 2020-12-05 ENCOUNTER — Other Ambulatory Visit: Payer: Self-pay | Admitting: Family

## 2020-12-05 DIAGNOSIS — F0391 Unspecified dementia with behavioral disturbance: Secondary | ICD-10-CM

## 2020-12-05 DIAGNOSIS — F411 Generalized anxiety disorder: Secondary | ICD-10-CM

## 2020-12-15 ENCOUNTER — Other Ambulatory Visit: Payer: Self-pay

## 2020-12-15 ENCOUNTER — Other Ambulatory Visit: Payer: Medicare Other

## 2020-12-15 DIAGNOSIS — I10 Essential (primary) hypertension: Secondary | ICD-10-CM | POA: Diagnosis not present

## 2020-12-15 DIAGNOSIS — E785 Hyperlipidemia, unspecified: Secondary | ICD-10-CM | POA: Diagnosis not present

## 2020-12-16 LAB — CBC WITH DIFFERENTIAL/PLATELET
Absolute Monocytes: 439 cells/uL (ref 200–950)
Basophils Absolute: 8 cells/uL (ref 0–200)
Basophils Relative: 0.2 %
Eosinophils Absolute: 111 cells/uL (ref 15–500)
Eosinophils Relative: 2.7 %
HCT: 33.1 % — ABNORMAL LOW (ref 35.0–45.0)
Hemoglobin: 10.9 g/dL — ABNORMAL LOW (ref 11.7–15.5)
Lymphs Abs: 1681 cells/uL (ref 850–3900)
MCH: 31.7 pg (ref 27.0–33.0)
MCHC: 32.9 g/dL (ref 32.0–36.0)
MCV: 96.2 fL (ref 80.0–100.0)
MPV: 12.1 fL (ref 7.5–12.5)
Monocytes Relative: 10.7 %
Neutro Abs: 1861 cells/uL (ref 1500–7800)
Neutrophils Relative %: 45.4 %
Platelets: 145 10*3/uL (ref 140–400)
RBC: 3.44 10*6/uL — ABNORMAL LOW (ref 3.80–5.10)
RDW: 12.4 % (ref 11.0–15.0)
Total Lymphocyte: 41 %
WBC: 4.1 10*3/uL (ref 3.8–10.8)

## 2020-12-16 LAB — COMPLETE METABOLIC PANEL WITH GFR
AG Ratio: 1 (calc) (ref 1.0–2.5)
ALT: 15 U/L (ref 6–29)
AST: 22 U/L (ref 10–35)
Albumin: 3.8 g/dL (ref 3.6–5.1)
Alkaline phosphatase (APISO): 35 U/L — ABNORMAL LOW (ref 37–153)
BUN/Creatinine Ratio: 19 (calc) (ref 6–22)
BUN: 17 mg/dL (ref 7–25)
CO2: 25 mmol/L (ref 20–32)
Calcium: 9.1 mg/dL (ref 8.6–10.4)
Chloride: 105 mmol/L (ref 98–110)
Creat: 0.91 mg/dL — ABNORMAL HIGH (ref 0.60–0.88)
GFR, Est African American: 63 mL/min/{1.73_m2} (ref >=60)
GFR, Est Non African American: 55 mL/min/{1.73_m2} — ABNORMAL LOW (ref >=60)
Globulin: 3.7 g/dL (calc) (ref 1.9–3.7)
Glucose, Bld: 88 mg/dL (ref 65–139)
Potassium: 4.2 mmol/L (ref 3.5–5.3)
Sodium: 138 mmol/L (ref 135–146)
Total Bilirubin: 0.5 mg/dL (ref 0.2–1.2)
Total Protein: 7.5 g/dL (ref 6.1–8.1)

## 2020-12-16 LAB — LIPID PANEL
Cholesterol: 213 mg/dL — ABNORMAL HIGH (ref <200)
HDL: 69 mg/dL (ref >=50)
LDL Cholesterol (Calc): 127 mg/dL (calc) — ABNORMAL HIGH
Non-HDL Cholesterol (Calc): 144 mg/dL (calc) — ABNORMAL HIGH (ref <130)
Total CHOL/HDL Ratio: 3.1 (calc) (ref <5.0)
Triglycerides: 78 mg/dL (ref <150)

## 2020-12-16 LAB — TSH: TSH: 3.36 mIU/L (ref 0.40–4.50)

## 2020-12-28 ENCOUNTER — Telehealth: Payer: Self-pay | Admitting: *Deleted

## 2020-12-28 NOTE — Telephone Encounter (Signed)
[name], daughter, called and stated that she just wanted to let you know that patient is not drinking her water like she should. Stated that she is beginning to wet herself more with a foul odor. No other symptoms noted.  Stated that she is giving her Cranberry everyday.   Stated that they haven't heard from [place] since last month because the family is in the process of trying to get the insurance straightened out with the [place].   Stated that she would like some advise on the possible UTI. Stated that she Cannot bring patient into the office for an appointment because she cannot handle patient.   Please Advise.

## 2020-12-28 NOTE — Telephone Encounter (Signed)
Daughter,Kathleen Gallegos called back and stated that she would not be able to schedule virtual appointment and come get specimen cup(that's too much for her) at this time. She stated that it is hard to get her to drink water and on a daily basis she may get 2,8oz glasses of water. She is going to try to get cranberry supplement for patient to take. She really doesn't know what to do at this time, she feels burnt out trying to take care of mother. Patient having issues with sleeping and holding medication in mouth. Daughter just wanted Kathleen Gallegos made aware of these issues.  Routed to Marlowe Sax, NP

## 2020-12-28 NOTE — Telephone Encounter (Signed)
Encourage small but frequent water intake.Arrange virtual visit if unable to bring to office then will need to pick up specimen cup to collect urine specimen at home.

## 2020-12-28 NOTE — Telephone Encounter (Signed)
LMOM to return call.

## 2021-01-03 ENCOUNTER — Telehealth: Payer: Self-pay

## 2021-01-03 NOTE — Telephone Encounter (Signed)
01/03/21 @11  AM: Palliative care SW returned TC to patients daughter, [name].  Call unsuccessful. SW LVM. Awaiting return call.

## 2021-01-03 NOTE — Telephone Encounter (Signed)
01/03/21 @ 445 PM: INCOMING CALL from patients daughter, [name].  Daughter shared that she feels patients condition is worsening and really needs placement. Daughter states that she feels patient may have a UTI as well.  SW advised daughter at this SW and medical providers have provided all recommendations and paperwork to assist with placement and that family has to take steps in completing paperwork that was provided to them in March of this year. Daughter stated frustration with her family and their lack of urgency or involvement in regard to this matter. Daughter shared that she will e outreaching the clerk of courts to gain insight on what she can do, as she and her sisters all have guardianship over patient.   Next in person visit scheduled for 01/06/21 @11  AM.

## 2021-01-04 ENCOUNTER — Telehealth: Payer: Self-pay

## 2021-01-04 NOTE — Telephone Encounter (Signed)
01/04/21 @ 1240 PM: INCOMING CALL : Patients daughter Kathleen Gallegos SW inquiring of next steps in paperwork for patient.  SW informed daughter that family has not completed any supportive forms for Medicaid or VA benefits on patients behalf to allow patient to be eligible for services that may be available to her.  SW advised that there has been a few emails sent to the family for follow up and phone calls to other sibling regarding paperwork. Daughter shared that she would prefer to have the Medicaid application and VA pension application as a hard copy for her to complete and mail in.   SW will mail Medicaid and VA pension forms directly to patients daughter, Kathleen Gallegos, at her request to complete and submit n patients behalf.

## 2021-01-05 ENCOUNTER — Telehealth: Payer: Self-pay

## 2021-01-05 NOTE — Telephone Encounter (Signed)
I agree need to discuss with social worker family concerns in helping with patient/guardian issues and patient's need to be placed in memory care as I have recommended in the past.

## 2021-01-05 NOTE — Telephone Encounter (Signed)
AR:[id number] Boody- Adana Marik states they have a Education officer, museum coming out tomorrow at [employer], paperwork still need to be filled out for [place]. Sister to be guardian but not helping as much. [name] says the patient seems to be more fragile and wont go to bed without being forced. She going through more depends. [name] not experienced with dealing with Dementia. [name] not the guardian and exhausted. Need memory care. She doesn't know what to do. I advised her I will notate her call and hope she receive more guidance from their meeting tomorrow.

## 2021-01-06 ENCOUNTER — Other Ambulatory Visit: Payer: Medicare Other

## 2021-01-06 ENCOUNTER — Other Ambulatory Visit: Payer: Self-pay

## 2021-01-06 VITALS — BP 120/74 | HR 73 | Temp 97.3°F | Resp 20 | Wt 125.0 lb

## 2021-01-06 DIAGNOSIS — Z515 Encounter for palliative care: Secondary | ICD-10-CM

## 2021-01-06 NOTE — Progress Notes (Signed)
COMMUNITY PALLIATIVE CARE SW NOTE  PATIENT NAME: Kathleen Gallegos DOB: [birth year] MRN: [id number]  PRIMARY CARE PROVIDER: [name], NP  RESPONSIBLE PARTY:  Acct ID - Guarantor Home Phone Work Phone Relationship Acct Type  [id number] [name]* [phone]  Self P/F     [address], [place], [place] [address]     PLAN OF CARE and INTERVENTIONS:             GOALS OF CARE/ ADVANCE CARE PLANNING:  Patient is a DNR currently. Patient's middle daughter, Kathleen Gallegos, has guardianship of patient.   2.         SOCIAL/EMOTIONAL/SPIRITUAL ASSESSMENT/ INTERVENTIONS:  SW and RN met with patient and patient's daughters Kathleen Gallegos and Kathleen Gallegos. Daughters were all available for visit to discuss long term planning for patient.   Patient continues with advancing dementia. Patient has had 1 fall since previous visit, and sustained no injuries. Fall occurred due to imbalance. Daughter has been encouraging fluids and cranberry juice. Patient's current weight is 125lb. Appetite continues to be poor. Daughter concerned that patient has a UTI.   Daughter, Kathleen Gallegos, shares that she continues having more difficulty with patient due to her declining cognition. Per daughter, patient's confusion, argumentative lashes and combativeness continues to be the present. Daughter is having issues with providing hygiene care to patient.   Daughter shared that she has spoken with old friends/contacts, Kathleen Gallegos, has been once to come and assist with bathing of patient however this is not consistent support. Daughter has denied outreaching the shared respite programs: [facility] [phone] and [facility] program for possible support. Daughter shared that she just has not had the energy to follow through with outreaching the programs. SW advised daughter that these programs can offer in home support, however she has to outreach them and respond to their emails. Daughter acknowledged necessity, but stated she does not know  if she will have the energy. SW discussed the need for motivation from daughter gain assistance for patient and herself.    SW continues to highly encourage daughter and her siblings to complete all applications (VA pension and Medicaid application) for patient to receive assistance in the home as well as begin placement process for patient. Daughter stated understanding and will be following up with family again. Daughter is aware that SW/palliative care cannot assist any further with placement options until these items are complete, unless family desires to pay privately for placement.   SW sent a new referral to the [facility] that may offer Caregiver assistance funds to provide respite support in the home. Caregiver will receive contact from Dementia alliance to process need further.    RN checked vitals and reviewed medications. RN also assisted patient with hygiene incontinence care at beginning of visit. Daughter expressed concern of patient's hygiene and possibly causing UTI, as patient does not allow daughter to assist with ADL care.   Daughter shared that she has purchased a latch for patients room door to keep her in at night. SW discussed safety concerns and issues in using locks for patients. SW discussed monitors and door alarms instead of door latches and locks.    SW discussed goals, reviewed care plan, provided emotional support, used active and reflective listening. Palliative care will continue to monitor and assist with long term care planning as needed.   3.         PATIENT/CAREGIVER EDUCATION/ COPING:  Patient alert with confusion. Patient's dementia is progressed. Patient enjoyed sewing and watching westerns.  Patient has 3 children and a lot of grandchildren and great grandchildren. Daughter shares that she feels patient is having increased anxiety.  Continued caregiver support provided to daughter.   4.         PERSONAL EMERGENCY PLAN:  Family will call 9-1-1 for  emergencies.   5.         COMMUNITY RESOURCES COORDINATION/ HEALTH CARE NAVIGATION:  Patient's daughter manages care and is primary caregiver.   6.         FINANCIAL/LEGAL CONCERNS/INTERVENTIONS: Patient on fixed income, daughter Kathleen Gallegos, is guardian over finances and person.      SOCIAL HX:  Social History   Tobacco Use   Smoking status: Never   Smokeless tobacco: Former    Types: Snuff  Substance Use Topics   Alcohol use: No    CODE STATUS: DNR ADVANCED DIRECTIVES: Y MOST FORM COMPLETE:  Y HOSPICE EDUCATION PROVIDED: N  PPS: patient requires assistance with ADL's.    Time spent: [place], [place]

## 2021-01-06 NOTE — Progress Notes (Signed)
PATIENT NAME: Kathleen Gallegos DOB: 02/22/1926 MRN: [id number]  PRIMARY CARE PROVIDER: [name], NP  RESPONSIBLE PARTY:  Acct ID - Guarantor Home Phone Work Phone Relationship Acct Type  [id number] [name]* [phone]  Self P/F     [address], [place], [place] [address]    PLAN OF CARE and INTERVENTIONS:               1.  GOALS OF CARE/ ADVANCE CARE PLANNING:  Daughter would like placement into a Memory Care facility.               2.  PATIENT/CAREGIVER EDUCATION:  Active listening and support provided to [name] as she reports ongoing caregiver fatigue.                4. PERSONAL EMERGENCY PLAN:  Activate 911 for emergencies               5.  DISEASE STATUS:  Joint visit completed with [name], SW, daughter [name] and patient.  Patient is found in her bathroom.  Daughter is requesting assistance with personal hygiene.  Patient agreeable to have brief and pants changed.  Patient incontinent of urine.  Incontinence care provided, clean brief and pants put on patient.   Daughter attempted to hire a caregiver to assist with bathing.  Patient received a shower 4 weeks ago.  Daughter states caregiver would not be able to come back due to an illness in her family.   Palliative Care SW provided respite care information to daughter recently but she has not completed the form.  VA paperwork has also not been completed by other sister [name] per [name].  [place] that until applications for VA, Medicaid and respite are completed and submitted Palliative Care is unable to assist with placement or in-home help.  Patient is ambulating without assistive devices.  She is having increase difficulty standing from her recliner chair.  Last fall noted on May 15th without injuries.  Daughter stated she found patient in the corner of the living room.  Patient continues to eat well although her fluid intake has been poor.  Daughter states patient typically drinks 16 oz or less of water a  day.  2 lb weight loss present since our last visit.  Patient is able to feed herself but requires set up assistance.  Cereal prepared for patient by daughter. Daughter feels patient is holding food sometimes.  BMI 20.18.  Active listening provided as [name] discussed the challenges of caregiving and caregiver fatigue.     HISTORY OF PRESENT ILLNESS:  87 year old female with Dementia.  Patient is being followed by Palliative Care monthly and PRN.  CODE STATUS: Full ADVANCED DIRECTIVES: No MOST FORM: No PPS: 50%   PHYSICAL EXAM:   VITALS: Today's Vitals   01/06/21 1104  BP: 120/74  Pulse: 73  Resp: 20  Temp: (!) 97.3 F (36.3 C)  SpO2: 93%  Weight: 125 lb (56.7 kg)  PainSc: 0-No pain    LUNGS: clear to auscultation  CARDIAC: Cor RRR}  EXTREMITIES: - for edema. SKIN: Skin color, texture, turgor normal. No rashes or lesions or normal  NEURO: positive for gait problems and memory problems       Lorenza Burton, RN

## 2021-01-27 ENCOUNTER — Telehealth: Payer: Self-pay | Admitting: *Deleted

## 2021-01-27 NOTE — Telephone Encounter (Signed)
Might need to collect urine specimen to see if she has a urinary tract infection.

## 2021-01-27 NOTE — Telephone Encounter (Signed)
Patient daughter, [name], called and stated that she just wanted to let you know that patient [place] on Monday and then again this morning. No Injuries noted. Patient doesn't remember how she fell. Daughter thinks she is falling due to the Dust on the floor.  Stated that she has a call placed to [place], [place] for evaluation.   FYI

## 2021-01-27 NOTE — Telephone Encounter (Signed)
Daughter does not want to bring patient into office. Is it ok if a cup is picked up and brought back for Culture.   Please Advise.

## 2021-01-27 NOTE — Telephone Encounter (Signed)
May pick up urine specimen then bring specimen for U/A and C/S

## 2021-01-30 ENCOUNTER — Telehealth: Payer: Self-pay

## 2021-01-30 NOTE — Telephone Encounter (Signed)
Patient daughter stated that patient is fine. Stated that [place] with Palliative Care will be coming out tomorrow for evaluation.  Confirmed appointment with daughter, she will call us if patient needs to be seen sooner.

## 2021-01-30 NOTE — Telephone Encounter (Signed)
01/30/21 '@11'$  AM: Palliative care SW returned patients daughter, [name] phone call.  Daughter shared that patient has had 3 falls on two different days last week. No injuries. Daughter shared that she outreached PCP office as well. Testing for UA discussed, daughter to pick up specimen bottle from PCP office to test for UTI. Daughter fears that home setting is no longer safe for patient.  Daughter shares that patient is scheduled to see PCP 8/12. PC will f/u after appt and schedule in home visit.

## 2021-02-03 ENCOUNTER — Telehealth: Payer: Self-pay

## 2021-02-03 NOTE — Telephone Encounter (Signed)
02/03/21 @ 1030 AM: Palliative care SW outreached patients daughter, [name], to follow up of her progress with completing Medicaid and VA pension application for patient.  Call unsuccessful. SW LVM.

## 2021-02-22 ENCOUNTER — Other Ambulatory Visit: Payer: Self-pay

## 2021-02-22 ENCOUNTER — Ambulatory Visit (INDEPENDENT_AMBULATORY_CARE_PROVIDER_SITE_OTHER): Payer: Medicare Other | Admitting: Family

## 2021-02-22 ENCOUNTER — Encounter: Payer: Self-pay | Admitting: Family

## 2021-02-22 VITALS — BP 128/78 | HR 71 | Temp 97.7°F | Resp 16 | Ht 66.0 in | Wt 124.8 lb

## 2021-02-22 DIAGNOSIS — J301 Allergic rhinitis due to pollen: Secondary | ICD-10-CM | POA: Diagnosis not present

## 2021-02-22 DIAGNOSIS — N1831 Chronic kidney disease, stage 3a: Secondary | ICD-10-CM | POA: Diagnosis not present

## 2021-02-22 DIAGNOSIS — E559 Vitamin D deficiency, unspecified: Secondary | ICD-10-CM

## 2021-02-22 DIAGNOSIS — F0391 Unspecified dementia with behavioral disturbance: Secondary | ICD-10-CM

## 2021-02-22 DIAGNOSIS — I129 Hypertensive chronic kidney disease with stage 1 through stage 4 chronic kidney disease, or unspecified chronic kidney disease: Secondary | ICD-10-CM | POA: Diagnosis not present

## 2021-02-22 DIAGNOSIS — M810 Age-related osteoporosis without current pathological fracture: Secondary | ICD-10-CM

## 2021-02-22 DIAGNOSIS — K5901 Slow transit constipation: Secondary | ICD-10-CM

## 2021-02-22 DIAGNOSIS — N183 Chronic kidney disease, stage 3 unspecified: Secondary | ICD-10-CM

## 2021-02-22 DIAGNOSIS — F411 Generalized anxiety disorder: Secondary | ICD-10-CM

## 2021-02-22 DIAGNOSIS — Z23 Encounter for immunization: Secondary | ICD-10-CM | POA: Diagnosis not present

## 2021-02-22 DIAGNOSIS — M8949 Other hypertrophic osteoarthropathy, multiple sites: Secondary | ICD-10-CM | POA: Diagnosis not present

## 2021-02-22 DIAGNOSIS — E785 Hyperlipidemia, unspecified: Secondary | ICD-10-CM | POA: Diagnosis not present

## 2021-02-22 DIAGNOSIS — R634 Abnormal weight loss: Secondary | ICD-10-CM | POA: Diagnosis not present

## 2021-02-22 DIAGNOSIS — M150 Primary generalized (osteo)arthritis: Secondary | ICD-10-CM

## 2021-02-22 DIAGNOSIS — D631 Anemia in chronic kidney disease: Secondary | ICD-10-CM

## 2021-02-22 DIAGNOSIS — M159 Polyosteoarthritis, unspecified: Secondary | ICD-10-CM

## 2021-02-22 MED ORDER — ENSURE ACTIVE HIGH PROTEIN PO LIQD: 1.0000 | Freq: Every day | ORAL | Status: DC

## 2021-02-22 NOTE — Progress Notes (Signed)
Provider: Marlowe Sax FNP-C   Carlyne Keehan, Nelda Bucks, NP  Patient Care Team: Babygirl Trager, Nelda Bucks, NP as PCP - General (Family Medicine) Whitney Muse, Kelby Fam, MD (Inactive) as Consulting Physician (Hematology and Oncology) Rutherford Guys, MD as Consulting Physician (Ophthalmology) Delice Lesch Lezlie Octave, MD as Consulting Physician (Neurology)  Extended Emergency Contact Information Primary Emergency Contact: Mikey College Address: [address]          [place], [place] [address] Johnnette Litter of [place] Phone: [phone] Mobile Phone: [phone] Relation: Daughter Secondary Emergency Contact: [place] Phone: [phone] Mobile Phone: [phone] Relation: Daughter  Code Status:  Full code  Goals of care: Advanced Directive information Advanced Directives 02/22/2021  Does Patient Have a Medical Advance Directive? No  Type of Advance Directive -  Does patient want to make changes to medical advance directive? -  Copy of [place] in Chart? -  Would patient like information on creating a medical advance directive? No - Patient declined     Chief Complaint  Patient presents with   Medical Management of Chronic Issues    3 month follow up.   Immunizations    Discuss the need for tetanus vaccine, shingrix vaccine, influenza vaccine.    HPI:  Pt is a 87 y.o. female seen today for 3 months follow up for medical management of chronic diseases. Has a medical history of Hypertension,Hx of TIA,Generalized anxiety disorder,Osteoarthritis of multiple sites,Hyperlipidemia,Allergic rhinitis due to pollen,dementia with Behavioral disturbance,Age-related Osteoporosis without any fracture.she is here with her daughter [name] who is the primary care giver. She denies any acute issues today.states ha not had a headache in years. Appetite has been good except recently did not complete her food.Has had a one pound weight loss over three months since last visit.  Gait  is more unsteady.No fall episode.   She requires assistance more with her ADL's. Has been more incontinent.Daughter states requires more reminds when using the bathroom,to put on her shoes/sock.sometimes sits and watches the TV and does not go to bed until she is reminded.sometimes declines to go to bed.  Constipation - miralax has been effective   Tdap vaccine previously send to pharmacy and advised to get vaccine but unclear why she has not received it.Also previously advised to get her shingrix vaccine at the pharmacy but has not. She is due for her 2 nd COVID-19 booster vaccine.    Past Medical History:  Diagnosis Date   Allergic rhinitis, cause unspecified    Angioneurotic edema not elsewhere classified    Anxiety disorder    Asthma    Carotid bruit    Carpal tunnel syndrome    Chronic abdominal pain    Fecal incontinence    GERD (gastroesophageal reflux disease)    Hyperlipidemia    IBS (irritable bowel syndrome)    Insomnia    Lung nodule    Osteoarthritis    Palpitations    Pancytopenia (HCC)    Senile osteoporosis    Past Surgical History:  Procedure Laterality Date   ABDOMINAL HYSTERECTOMY  1979   fibroids    bilateral cataract surgery  2008   BIOPSY THYROID  1970    COLONOSCOPY  12/21/04   normal -redundant    EUS  08/07/07   EYE SURGERY      Allergies  Allergen Reactions   Diphenhydramine Hcl Other (See Comments)    unknown   Rivastigmine Tartrate Palpitations    Allergies as of 02/22/2021       Reactions  Diphenhydramine Hcl Other (See Comments)   unknown   Rivastigmine Tartrate Palpitations        Medication List        Accurate as of February 22, 2021  4:57 PM. If you have any questions, ask your nurse or doctor.          CRANBERRY PO Take 25 mg by mouth daily.   donepezil 10 MG tablet Commonly known as: ARICEPT TAKE 1 TABLET BY MOUTH AT BEDTIME   Ensure Active High Protein Liqd Take 1 Can by mouth daily. Started by: [name], NP   fexofenadine 180 MG tablet Commonly known as: ALLEGRA Take 180 mg by mouth as needed.   fluticasone 50 MCG/ACT nasal spray Commonly known as: FLONASE Place 1 spray into both nostrils as needed.   Melatonin 10 MG Tabs Take 10 mg by mouth daily.   metoprolol succinate 25 MG 24 hr tablet Commonly known as: TOPROL-XL Take 1 tablet by mouth once daily   polyethylene glycol powder 17 GM/SCOOP powder Commonly known as: GLYCOLAX/MIRALAX Take 17 g by mouth daily. Hold for loose stool   sertraline 50 MG tablet Commonly known as: ZOLOFT Take 1 tablet by mouth once daily   Shingrix injection Generic drug: Zoster Vaccine Adjuvanted Inject 0.5 mLs into the muscle once.   Tdap 5-2.5-18.5 LF-MCG/0.5 injection Commonly known as: BOOSTRIX Inject 0.5 mLs into the muscle once.        Review of Systems  Unable to perform ROS: Dementia (additional information provided by daughter [name])  Constitutional:  Negative for appetite change, chills, fatigue, fever and unexpected weight change.  HENT:  Negative for congestion, dental problem, ear discharge, ear pain, facial swelling, hearing loss, nosebleeds, postnasal drip, rhinorrhea, sinus pressure, sinus pain, sneezing, sore throat, tinnitus and trouble swallowing.   Eyes:  Negative for pain, discharge, redness, itching and visual disturbance.  Respiratory:  Negative for cough, chest tightness, shortness of breath and wheezing.   Cardiovascular:  Negative for chest pain, palpitations and leg swelling.  Gastrointestinal:  Negative for abdominal distention, abdominal pain, blood in stool, constipation, diarrhea, nausea and vomiting.  Endocrine: Negative for cold intolerance, heat intolerance, polydipsia, polyphagia and polyuria.  Genitourinary:  Positive for frequency. Negative for difficulty urinating, dysuria and urgency.       Incontinent for bladder   Musculoskeletal:  Positive for gait problem. Negative for arthralgias, back  pain, joint swelling, myalgias, neck pain and neck stiffness.  Skin:  Negative for color change, pallor, rash and wound.  Neurological:  Negative for dizziness, syncope, speech difficulty, weakness, light-headedness, numbness and headaches.  Hematological:  Does not bruise/bleed easily.  Psychiatric/Behavioral:  Positive for behavioral problems and confusion. Negative for agitation, hallucinations, self-injury, sleep disturbance and suicidal ideas. The patient is not nervous/anxious.    Immunization History  Administered Date(s) Administered   Fluad Quad(high Dose 65+) 03/25/2019, 04/29/2020   Influenza Split 03/27/2011, 04/09/2012   Influenza Whole 03/31/2007, 03/29/2009, 04/12/2009, 03/14/2010   Influenza, High Dose Seasonal PF 04/30/2018   Influenza,inj,Quad PF,6+ Mos 03/31/2013, 04/07/2014, 03/08/2015, 02/23/2016, 06/06/2016, 06/12/2017   Moderna Sars-Covid-2 Vaccination 09/12/2019, 10/14/2019, 05/26/2020, 12/02/2020   PPD Test 06/12/2017, 12/22/2018   Pneumococcal Conjugate-13 08/05/2014   Pneumococcal Polysaccharide-23 02/11/2006   Td 04/18/2005   Zoster, Live 05/26/2013   Pertinent  Health Maintenance Due  Topic Date Due   INFLUENZA VACCINE  02/06/2021   DEXA SCAN  Completed   PNA vac Low Risk Adult  Completed   Fall Risk  02/22/2021 11/15/2020 10/07/2020  05/27/2020 04/29/2020  Falls in the past year? 0 1 1 0 -  Number falls in past yr: 0 0 1 0 1  Injury with Fall? 0 1 1 0 0  Risk for fall due to : No Fall Risks - - - -  Follow up Falls evaluation completed - - - -   Functional Status Survey:    Vitals:   02/22/21 1541  BP: 128/78  Pulse: 71  Resp: 16  Temp: 97.7 F (36.5 C)  SpO2: 97%  Weight: 124 lb 12.8 oz (56.6 kg)  Height: _0  (1.676 m)   Body mass index is 20.14 kg/m. Physical Exam Vitals reviewed.  Constitutional:      General: She is not in acute distress.    Appearance: Normal appearance. She is normal weight. She is not ill-appearing or diaphoretic.   HENT:     Head: Normocephalic.     Right Ear: Tympanic membrane, ear canal and external ear normal. There is no impacted cerumen.     Left Ear: Tympanic membrane, ear canal and external ear normal. There is no impacted cerumen.     Nose: Nose normal. No congestion or rhinorrhea.     Mouth/Throat:     Mouth: Mucous membranes are moist.     Pharynx: Oropharynx is clear. No oropharyngeal exudate or posterior oropharyngeal erythema.  Eyes:     General: No scleral icterus.       Right eye: No discharge.        Left eye: No discharge.     Extraocular Movements: Extraocular movements intact.     Conjunctiva/sclera: Conjunctivae normal.     Pupils: Pupils are equal, round, and reactive to light.  Neck:     Vascular: No carotid bruit.  Cardiovascular:     Rate and Rhythm: Normal rate and regular rhythm.     Pulses: Normal pulses.     Heart sounds: Normal heart sounds. No murmur heard.   No friction rub. No gallop.  Pulmonary:     Effort: Pulmonary effort is normal. No respiratory distress.     Breath sounds: Normal breath sounds. No wheezing, rhonchi or rales.  Chest:     Chest wall: No tenderness.  Abdominal:     General: Bowel sounds are normal. There is no distension.     Palpations: Abdomen is soft. There is no mass.     Tenderness: There is no abdominal tenderness. There is no right CVA tenderness, left CVA tenderness, guarding or rebound.  Musculoskeletal:        General: No swelling or tenderness. Normal range of motion.     Cervical back: Normal range of motion. No rigidity or tenderness.     Right lower leg: No edema.     Left lower leg: No edema.  Lymphadenopathy:     Cervical: No cervical adenopathy.  Skin:    General: Skin is warm and dry.     Coloration: Skin is not pale.     Findings: No bruising, erythema, lesion or rash.  Neurological:     Mental Status: She is alert. Mental status is at baseline.     Cranial Nerves: No cranial nerve deficit.     Sensory: No  sensory deficit.     Motor: No weakness.     Coordination: Coordination normal.     Gait: Gait normal.  Psychiatric:        Mood and Affect: Mood normal.        Speech: Speech normal.  Behavior: Behavior normal.        Thought Content: Thought content normal.        Cognition and Memory: Memory is impaired.        Judgment: Judgment normal.    Labs reviewed: Recent Labs    05/16/20 1815 05/27/20 1619 12/15/20 1428  NA 136 137 138  K 3.9 3.8 4.2  CL 102 101 105  CO2 _0 GLUCOSE 96 107 88  BUN _1 CREATININE 0.85 0.92* 0.91*  CALCIUM 9.2 9.5 9.1   Recent Labs    04/08/20 1543 05/16/20 1815 12/15/20 1428  AST _2 ALT _3 ALKPHOS  --  35*  --   BILITOT 0.6 0.6 0.5  PROT 7.9 7.9 7.5  ALBUMIN  --  3.8  --    Recent Labs    05/16/20 1815 05/27/20 1619 12/15/20 1428  WBC 5.0 5.5 4.1  NEUTROABS 2.6 3,944 1,861  HGB 11.1* 11.8 10.9*  HCT 34.2* 35.2 33.1*  MCV 100.6* 97.2 96.2  PLT 130* 137* 145   Lab Results  Component Value Date   TSH 3.36 12/15/2020   No results found for: HGBA1C Lab Results  Component Value Date   CHOL 213 (H) 12/15/2020   HDL 69 12/15/2020   LDLCALC 127 (H) 12/15/2020   TRIG 78 12/15/2020   CHOLHDL 3.1 12/15/2020    Significant Diagnostic Results in last 30 days:  No results found.  Assessment/Plan 1. Primary osteoarthritis involving multiple joints Continue on OTC tylenol as needed for pain   2. Benign hypertension with chronic kidney disease, stage III (HCC) B/p well controlled. - continue on Metoprolol succinate  - CBC with Differential/Platelet; Future - CMP with eGFR([facility]); Future - TSH; Future  3. Allergic rhinitis due to pollen, unspecified seasonality Continue on Fexofenadine   4. Generalized anxiety disorder Stable on Zoloft   5. Dementia with behavioral disturbance, unspecified dementia type ([place]) Progressive decline expected Continue with supportive care  - continue on  Palliative care   6. Slow transit constipation Mirlax effective   7. Hyperlipidemia LDL goal <100 Previous LDL not at goal  Not on meds. - Lipid panel; Future  8. Age-related osteoporosis without current pathological fracture No recent fall episode  Encourage to drink Ensure one by mouth daily   9. Need for Tdap vaccination Advised to get Tdap vaccine at the pharmacy. Script send to pharmacy today   10. Need for zoster vaccination Advised to get shingrix vaccine at the pharmacy. Script send to pharmacy today   11. Chronic kidney disease, stage 3a (HCC) CR at baseline  continue to avoid Nephrotoxins and dose all other medication for renal clearance    12. Anemia due to stage 3a chronic kidney disease (HCC) Hgb 10.9  No signs of bleed reported  Continue to monitor  - CBC with Differential/Platelet  13. Vitamin D deficiency Previous Vit D in the past will recheck levels - Vitamin D, 1,25-dihydroxy; Future  14. Weight loss Has had a one pound loss over 3 months  Encourage to drink Ensure or boost daily as below - Nutritional Supplements (ENSURE ACTIVE HIGH PROTEIN) LIQD; Take 1 Can by mouth daily.  Dispense: 237 mL; Refill: ML - CMP with eGFR([facility])  Family/ staff Communication: Reviewed plan of care with patient and daughter verbalized understanding   Labs/tests ordered:  - CBC with Differential/Platelet - CMP with eGFR([facility]) - TSH - Hgb A1C - Lipid panel - vitamin D 1-25  dihydroxy    Next Appointment : 6 months for medical management of chronic issues.Fasting Labs prior to visit.    [name], NP

## 2021-02-22 NOTE — Patient Instructions (Signed)
Drink Ensure or Boost one by mouth daily

## 2021-02-24 ENCOUNTER — Other Ambulatory Visit: Payer: Medicare Other

## 2021-02-24 ENCOUNTER — Other Ambulatory Visit: Payer: Self-pay

## 2021-02-24 ENCOUNTER — Telehealth: Payer: Self-pay

## 2021-02-24 ENCOUNTER — Other Ambulatory Visit (INDEPENDENT_AMBULATORY_CARE_PROVIDER_SITE_OTHER): Payer: Medicare Other

## 2021-02-24 VITALS — BP 140/60 | HR 60 | Temp 98.2°F | Resp 18

## 2021-02-24 DIAGNOSIS — R350 Frequency of micturition: Secondary | ICD-10-CM

## 2021-02-24 DIAGNOSIS — R399 Unspecified symptoms and signs involving the genitourinary system: Secondary | ICD-10-CM | POA: Diagnosis not present

## 2021-02-24 DIAGNOSIS — Z515 Encounter for palliative care: Secondary | ICD-10-CM

## 2021-02-24 LAB — POCT URINALYSIS DIPSTICK
Bilirubin, UA: NEGATIVE
Glucose, UA: NEGATIVE
Ketones, UA: NEGATIVE
Nitrite, UA: NEGATIVE
Protein, UA: NEGATIVE
Spec Grav, UA: 1.02 (ref 1.010–1.025)
Urobilinogen, UA: 0.2 E.U./dL
pH, UA: 6 (ref 5.0–8.0)

## 2021-02-24 NOTE — Telephone Encounter (Signed)
02/24/21 '@11'$ :30 AM: Palliative care SW returned daughter, [name], [place].  Daughter shared that she mailed the completed VA pension application to [place] office. SW will check check office. Daughter request SW review application and mail to [place] on their behalf. Daughter and her husband also shared that they will complete Medicaid application today.

## 2021-02-24 NOTE — Progress Notes (Signed)
COMMUNITY PALLIATIVE CARE SW NOTE  PATIENT NAME: Kathleen Gallegos DOB: 1925/07/25 MRN: [id number]  PRIMARY CARE PROVIDER: [name], NP  RESPONSIBLE PARTY:  Acct ID - Guarantor Home Phone Work Phone Relationship Acct Type  [id number] [name]* [phone]  Self P/F     [place], [place], [place] [address]     PLAN OF CARE and INTERVENTIONS:             GOALS OF CARE/ ADVANCE CARE PLANNING:   Patient is a DNR currently. Patient's middle daughter, [name], has guardianship of patient.   2.         SOCIAL/EMOTIONAL/SPIRITUAL ASSESSMENT/ INTERVENTIONS:  SW and RN met with patient and patient's daughters [name]. Daughters updated PC on medical changes.   Patient had recent appointment with PCP on 8/17, visit dx was for primary osteoarthritis. PCP office provided daughter with supplies to obtain a UA specimen at home. PC RN assisted daughter with obtaining UA and will drop specimen off to PCP office.  Patient continues with advancing dementia. Patient has had 1 fall on 8/10 since previous conversation on 7/25, and sustained no injuries. Fall occurred due to imbalance. Patient continues to be a fall risk. Daughter has been encouraging fluids and cranberry juice, patient is drinking up 16oz of fluid daily. Patient's current weight is 124lb. Appetite continues to be poor.    Daughter, [name], shares that she continues having more difficulty with patient due to her declining cognition. Per daughter, patient's confusion, argumentative lashes and combativeness continues to be the present. Daughter is having issues with providing hygiene care to patient. Daughter shares that patient is having more difficulty with her sit-to-stand.   Daughter shared that she has spoken with old friends/contacts, [name], visited on 8/8 to assist with bathing of patient however this is not consistent support. Daughter has denied outreaching the shared respite programs: [facility] 3033797877 and [facility]  Respite program for possible support. Daughter shared that she just has not had the energy to follow through with outreaching the programs. SW advised daughter that these programs can offer in home support, however she has to outreach them and respond to their emails. Daughter acknowledged necessity, but stated she does not know if she will have the energy. SW discussed the need for motivation from daughter gain assistance for patient and herself.     SW sent a new referral to the [facility] that may offer Caregiver assistance funds to provide respite support in the home. Caregiver will receive contact from Dementia alliance to process need further.    RN checked vitals and reviewed medications. RN also assisted patient with hygiene incontinence care at beginning of visit. Daughter expressed concern of patient's hygiene and possibly causing UTI, as patient does not allow daughter to assIst with ADL care. Per daughter patient is more incontinent if urine.     SW discussed goals, reviewed care plan, provided emotional support, used active and reflective listening. Palliative care will continue to monitor and assist with long term care planning as needed.   3.         PATIENT/CAREGIVER EDUCATION/ COPING:  Patient alert with confusion. Patient's dementia is progressed. Patient enjoyed sewing and watching westerns. Patient has 3 children and a lot of grandchildren and great grandchildren. Daughter shares that she feels patient is having increased anxiety.  Continued caregiver support provided to daughter.   4.         PERSONAL EMERGENCY PLAN:  Family will call 9-1-1 for emergencies.  5.         COMMUNITY RESOURCES COORDINATION/ HEALTH CARE NAVIGATION:  Patient's daughter manages care and is primary caregiver.   6.         FINANCIAL/LEGAL CONCERNS/INTERVENTIONS: Patient on fixed income, daughter [name], is guardian over finances and person.         SOCIAL HX:  Social History   Tobacco Use    Smoking status: Never   Smokeless tobacco: Former    Types: Snuff  Substance Use Topics   Alcohol use: No    CODE STATUS: DNR ADVANCED DIRECTIVES: Y MOST FORM COMPLETE:  Y HOSPICE EDUCATION PROVIDED: N  PPS: PATIENT HAS ADVANCED ALZHEIMER'S AND NEEDS MOD A WITH ADL'S.      Doreene Eland, [place]

## 2021-02-24 NOTE — Progress Notes (Signed)
PATIENT NAME: Kathleen Gallegos DOB: [birth year] MRN: [id number]  PRIMARY CARE PROVIDER: [name], NP  RESPONSIBLE PARTY:  Acct ID - Guarantor Home Phone Work Phone Relationship Acct Type  [id number] Kathleen Gallegos* [phone]  Self P/F     [place], [place], [place] [address]    PLAN OF CARE and INTERVENTIONS:               1.  GOALS OF CARE/ ADVANCE CARE PLANNING:  Family is working on [place] benefits and Medicaid application for possible placement.               2.  PATIENT/CAREGIVER EDUCATION:  Ongoing education on disease progression with Dementia.                4. PERSONAL EMERGENCY PLAN:  Activate 911 for emergencies.               5.  DISEASE STATUS:  Joint visit completed with Kathleen Gallegos, SW, daughter Kathleen Gallegos and patient.  Patient is found in her recliner chair in her pajamas.  Patient is ambulatory without assistive devices.  Daughter reports last fall on July 19, 22 and August 10 in her bedroom.  No injuries were reported.  Patient is mostly incontinent of urine and wearing depends.  She often needs cues to replace her depends.  Increasing bowel incontinence.   Daughter was given tools to collect a urine specimen. This was obtained this am and daughter is requesting drop off  at PCP after this visit.   Daughter reports patient continues with combative behaviors and is resistant to care.  She has hired a Database administrator that comes when she can.  There is no set schedule with this care.  Patient is requiring more assistance with dressing and incontinence care.  Patient has begun missing meals periodically mostly at dinner.  Patient has not eaten yet today as daughter states patient is sleeping later in the morning.  Ensure has been recommended daily.  Daughter endorsed PCP has also recommended this.  Daughter continues to voice her concerns over caregiver fatigue.  Active listening provided.  Urine specimen taken to PCP office in [place] and given to lab technician.   Results pending.   HISTORY OF PRESENT ILLNESS:  87 year old female with Dementia.  Patient is being followed by Palliative Care monthly and PRN.  CODE STATUS: Full ADVANCED DIRECTIVES: No MOST FORM: No PPS: 40%   PHYSICAL EXAM:   VITALS: Today's Vitals   02/24/21 1307  BP: 140/60  Pulse: 60  Resp: 18  Temp: 98.2 F (36.8 C)  SpO2: 97%  PainSc: 0-No pain    LUNGS: clear to auscultation  CARDIAC: Cor RRR}  EXTREMITIES: - for edema SKIN: dry and flaky skin.  NEURO: positive for gait problems and memory problems       Kathleen Burton, RN

## 2021-02-24 NOTE — Addendum Note (Signed)
Addended by: Carroll Kinds on: 02/24/2021 03:18 PM   Modules accepted: Orders

## 2021-02-26 LAB — URINE CULTURE
MICRO NUMBER:: 12267085
SPECIMEN QUALITY:: ADEQUATE

## 2021-03-03 ENCOUNTER — Telehealth: Payer: Self-pay

## 2021-03-03 NOTE — Telephone Encounter (Signed)
03/03/21: Palliative care SW mailed patients completed [place] application to [place] for review, on behalf of patient and family.

## 2021-03-20 ENCOUNTER — Telehealth: Payer: Self-pay

## 2021-03-20 NOTE — Telephone Encounter (Signed)
1230 pm.  Phone call made to daughter [name] to complete a telephonic visit.  No answer and message has been left requesting a call back.

## 2021-03-21 ENCOUNTER — Other Ambulatory Visit: Payer: Self-pay

## 2021-03-21 ENCOUNTER — Other Ambulatory Visit: Payer: Medicare Other

## 2021-03-21 DIAGNOSIS — Z515 Encounter for palliative care: Secondary | ICD-10-CM

## 2021-03-21 NOTE — Progress Notes (Signed)
PATIENT NAME: Kathleen Gallegos DOB: [birth year] MRN: [id number]  PRIMARY CARE PROVIDER: [name], NP  RESPONSIBLE PARTY:  Acct ID - Guarantor Home Phone Work Phone Relationship Acct Type  [id number] [name]* [phone]  Self P/F     [place], [place], [place] [address]    PLAN OF CARE and INTERVENTIONS:               1.  GOALS OF CARE/ ADVANCE CARE PLANNING:  Placement if patient has VA/Medicaid assistance.               2.  PATIENT/CAREGIVER EDUCATION:  Caregiver fatigue and disease progression.               4. PERSONAL EMERGENCY PLAN:  Activate 911 for emergencies.               5.  DISEASE STATUS:  Visit completed telephonically with patient's daughter [name].  [name] states patient continues to decline.  She notes patient had fall on Sunday.  She was unaware patient fell until she went into the room to assist the patient in getting started on the day.  [name] states she does not know how long patient was in the floor.   No injuries were noted.  Patient is requiring more assistance getting to a standing position.  She is mostly incontinent of urine and wearing depends.  Daughter is toileting when they first get up and before going to bed.  She is also assisting patient with dressing.  Daughter notes patient is having regular bowel movements as they are continuing with miralax daily.  Some bowel incontinence is reported.  Discussed disease progression with daughter.  Active listening provided as daughter voices her concerns over patient's decline and caregiver fatigue.  She notes care is becoming harder and harder.  Questioned if any information was received on [place] pension.  [name] states she received a notification in the mail this week and a new application is required as the previous one is no longer valid.  Offered assistance with completing form and [name] accepts.  She is grateful for this offer as she feels she will need assistance obtaining all the information that is  required.  Visit is schedule for next Thursday at 12 pm.   HISTORY OF PRESENT ILLNESS:  87 year old female with progressing dementia.  Patient is being followed by Palliative Care monthly and PRN.  CODE STATUS: Full ADVANCED DIRECTIVES: No MOST FORM: No PPS: 40%    Time: 26 minutes.    Lorenza Burton, RN

## 2021-03-29 ENCOUNTER — Ambulatory Visit: Payer: Medicare Other | Admitting: Family

## 2021-03-30 ENCOUNTER — Other Ambulatory Visit: Payer: Medicare Other

## 2021-03-30 ENCOUNTER — Other Ambulatory Visit: Payer: Self-pay

## 2021-03-30 VITALS — BP 118/68 | HR 60 | Temp 97.3°F | Resp 18

## 2021-03-30 DIAGNOSIS — Z515 Encounter for palliative care: Secondary | ICD-10-CM

## 2021-03-30 NOTE — Progress Notes (Signed)
PATIENT NAME: RENISE GILLIES DOB: [birth year] MRN: [id number]  PRIMARY CARE PROVIDER: [name], NP  RESPONSIBLE PARTY:  Acct ID - Guarantor Home Phone Work Phone Relationship Acct Type  [id number] [name]* [phone]  Self P/F     [place], [place], [place] [address]    PLAN OF CARE and INTERVENTIONS:               1.  GOALS OF CARE/ ADVANCE CARE PLANNING:  Family would like long term placement.  Currently working to complete VA paperwork to establish benefits.               2.  PATIENT/CAREGIVER EDUCATION:  Disease progression with Dementia.               4. PERSONAL EMERGENCY PLAN:  Activate 911 for emergencies.                5.  DISEASE STATUS:   Patient found sitting in her recliner chair dressed in her pajamas.  Daughter [name] assisted with placing a warm shirt on patient.  Lots of cues and assistance required to dress patient.  Patient is quiet and withdrawn today.  She is focused on watching the television. When sitting next to her, she will engage in short conversations.   Voice is soft and conversation is repetitive.   Patient is sleeping for longer periods of time.  Typically going to bed around 1030 pm and waking up around 11 am-12 pm.   [name] is now having to prompt patient to go to bed and to get out of the bed.  She is assisting with dressing, now having to brush patient's teeth and assisting with incontinence care.   Patient remains incontinent of urine and wears a depends.  Some incontinence of bowels are reported.  Miralax continues to be given daily and patient is having regular bowel movements.  Patient is ambulating without assistance.  She does need assistance to stand from a sitting position.  [name] reports patient is fearful of falling.  Last fall occurred on 03/19/21.  Patient was up at 7-730 am that morning and moving around in the room.  Daughter helped patient back to her bed to rest for a short while.  [name] states she heard a noise later  and checked on her mother.  She was found in the floor in the entryway of her closet.  No injuries were noted by daughter.    [name] notes patient's endurance has decreased.  It has become more challenging to take patient out to the grocery store or pharmacy.    [name] took patient to [employer] recently and patient had difficulty keeping up.  Patient was reporting fatigue on this trip.  Patient became shaky and needed to sit down.  Daughter noted a vomiting spell during this time.  No further occurrences reported.  [name] provides an update on patient and stress of caregiver fatigue.  Active listening provided.  Ongoing education provided on disease progression with Dementia.   HISTORY OF PRESENT ILLNESS:  87 year old female with Dementia.  Patient is being followed by Palliative Care monthly and PRN.   CODE STATUS: Full ADVANCED DIRECTIVES: No MOST FORM: No PPS: 40%   PHYSICAL EXAM:   VITALS: Today's Vitals   03/30/21 1236  BP: 118/68  Pulse: 60  Resp: 18  Temp: (!) 97.3 F (36.3 C)  SpO2: 95%  PainSc: 0-No pain    LUNGS: clear to auscultation  CARDIAC: Cor RRR}  EXTREMITIES: - for edema SKIN: Skin color, texture, turgor normal. No rashes or lesions or normal  NEURO: positive for memory problems       Lorenza Burton, RN

## 2021-03-30 NOTE — Progress Notes (Signed)
COMMUNITY PALLIATIVE CARE SW NOTE  PATIENT NAME: Kathleen Gallegos DOB: Apr 15, 1926 MRN: [id number]  PRIMARY CARE PROVIDER: [name], NP  RESPONSIBLE PARTY:  Acct ID - Guarantor Home Phone Work Phone Relationship Acct Type  [id number] [name]* [phone]  Self P/F     [place], [place], [place] [address]     PLAN OF CARE and INTERVENTIONS:             GOALS OF CARE/ ADVANCE CARE PLANNING:  Patient is a DNR currently. Patient's middle daughter, [name], has guardianship of patient.   2.         SOCIAL/EMOTIONAL/SPIRITUAL ASSESSMENT/ INTERVENTIONS:  SW and RN met with patient and patient's daughters [name]. Daughters updated PC on medical changes.   Patient continues with advancing dementia. Appetite continues to be poor. Daughter shares detailed stories of her interactions with patient since previous visit. Daughters explanations reflects disease progression of dementia in patient.   Daughter, [name], shares that she continues having more difficulty with patient due to her declining cognition. Per daughter, patient's confusion, argumentative lashes and combativeness continues to be the present. Daughter is having issues with providing hygiene care to patient. Daughter shares that patient is having more difficulty with her sit-to-stand.   SW assisted daughter in completing new spousal VA pension application that she received in the mail. SW will mail completed application on family's behalf.    RN checked vitals and reviewed medications. RN also assisted patient with hygiene incontinence care at beginning of visit.   Psychosocial assessment completed. Patient has adequate food and basic needs met. Patient will benefit from additional personal care assistance in the home. No S/S of depression or anxiety.     SW discussed goals, reviewed care plan, provided emotional support, used active and reflective listening. Palliative care will continue to monitor and assist with long  term care planning as needed.   3.         PATIENT/CAREGIVER EDUCATION/ COPING:  Patient alert with confusion. Patient's dementia is progressed. Patient enjoyed sewing and watching westerns. Patient has 3 children and a lot of grandchildren and great grandchildren. Daughter shares that she feels patient is having increased anxiety.  Continued caregiver support provided to daughter.   4.         PERSONAL EMERGENCY PLAN:  Family will call 9-1-1 for emergencies.   5.         COMMUNITY RESOURCES COORDINATION/ HEALTH CARE NAVIGATION:  Patient's daughter manages care and is primary caregiver.   6.         FINANCIAL/LEGAL CONCERNS/INTERVENTIONS: Patient on fixed income, daughter [name], is guardian over finances and person.          SOCIAL HX:  Social History   Tobacco Use   Smoking status: Never   Smokeless tobacco: Former    Types: Snuff  Substance Use Topics   Alcohol use: No    CODE STATUS: DNR ADVANCED DIRECTIVES: Y MOST FORM COMPLETE:  N HOSPICE EDUCATION PROVIDED: N  JQG:BEEFEOF is supervision-MINA with all ADL's at this time. Patient is A&O to self only with poor insight and judgement due to progressing dementia.      Doreene Eland, [place]

## 2021-03-31 ENCOUNTER — Ambulatory Visit: Payer: Medicare Other | Admitting: Family

## 2021-04-11 DIAGNOSIS — Z23 Encounter for immunization: Secondary | ICD-10-CM | POA: Diagnosis not present

## 2021-04-26 ENCOUNTER — Other Ambulatory Visit: Payer: Self-pay | Admitting: Family

## 2021-04-26 NOTE — Telephone Encounter (Signed)
Patient has request refill on medication "Donepezil". Medication last refill was in December 2021. Medication has allergy contraindications. Medication pend and sent to PCP Ngetich, Nelda Bucks, NP .

## 2021-05-03 ENCOUNTER — Ambulatory Visit: Payer: Medicare Other | Admitting: Family

## 2021-05-03 ENCOUNTER — Encounter: Payer: Self-pay | Admitting: Family

## 2021-05-03 ENCOUNTER — Other Ambulatory Visit: Payer: Self-pay

## 2021-05-03 ENCOUNTER — Ambulatory Visit (INDEPENDENT_AMBULATORY_CARE_PROVIDER_SITE_OTHER): Payer: Medicare Other | Admitting: Family

## 2021-05-03 VITALS — BP 120/88 | HR 84 | Temp 98.0°F | Resp 16 | Ht 66.0 in | Wt 119.8 lb

## 2021-05-03 DIAGNOSIS — Z23 Encounter for immunization: Secondary | ICD-10-CM

## 2021-05-03 DIAGNOSIS — F0390 Unspecified dementia without behavioral disturbance: Secondary | ICD-10-CM

## 2021-05-03 DIAGNOSIS — R634 Abnormal weight loss: Secondary | ICD-10-CM

## 2021-05-03 MED ORDER — DONEPEZIL HCL 10 MG PO TABS: 5.0000 mg | ORAL_TABLET | Freq: Every day | ORAL | 0 refills | Status: DC

## 2021-05-03 NOTE — Patient Instructions (Addendum)
Drink Ensure one can by mouth daily may divide in small portion and drinks several times a day.   - Reduce Donepezil from 10 mg to 5 mg tablet one by mouth daily

## 2021-05-03 NOTE — Progress Notes (Signed)
Provider: Marlowe Sax FNP-C  Bawi Lakins, Nelda Bucks, NP  Patient Care Team: Kelsee Preslar, Nelda Bucks, NP as PCP - General (Family Medicine) Whitney Muse, Kelby Fam, MD (Inactive) as Consulting Physician (Hematology and Oncology) Rutherford Guys, MD as Consulting Physician (Ophthalmology) Delice Lesch Lezlie Octave, MD as Consulting Physician (Neurology)  Extended Emergency Contact Information Primary Emergency Contact: Mikey College Address: [address]          [place], [place] [address] Johnnette Litter of [place] Phone: [phone] Mobile Phone: [phone] Relation: Daughter Secondary Emergency Contact: [place] Phone: [phone] Mobile Phone: [phone] Relation: Daughter  Code Status: Full Code  Goals of care: Advanced Directive information Advanced Directives 05/03/2021  Does Patient Have a Medical Advance Directive? Yes  Type of Advance Directive -  Does patient want to make changes to medical advance directive? No - Guardian declined  Copy of [place] in Chart? -  Would patient like information on creating a medical advance directive? -     Chief Complaint  Patient presents with   Acute Visit    Patient daughter complains of weight loss and wanting Handicap place card.    HPI:  Pt is a 87 y.o. female seen today for an acute visit for evaluation of weight loss. Has lost 5.2 lbs over 2 months.daughter states eats all the food though  eats them different.Has been trying to eat ice cream.Has not been drinking her boost.  Miralax has been effective.Bowel moving once in the morning.Not drinking enough water.sometimes has a stain when wiping.seems to be from the vaginal but not bloody. Having problems getting off the commode.Daughter helps. Has a friend from church who helps her with her showers   Past Medical History:  Diagnosis Date   Allergic rhinitis, cause unspecified    Angioneurotic edema not elsewhere classified    Anxiety disorder     Asthma    Carotid bruit    Carpal tunnel syndrome    Chronic abdominal pain    Fecal incontinence    GERD (gastroesophageal reflux disease)    Hyperlipidemia    IBS (irritable bowel syndrome)    Insomnia    Lung nodule    Osteoarthritis    Palpitations    Pancytopenia (HCC)    Senile osteoporosis    Past Surgical History:  Procedure Laterality Date   ABDOMINAL HYSTERECTOMY  1979   fibroids    bilateral cataract surgery  2008   BIOPSY THYROID  1970    COLONOSCOPY  12/21/04   normal -redundant    EUS  08/07/07   EYE SURGERY      Allergies  Allergen Reactions   Diphenhydramine Hcl Other (See Comments)    unknown   Rivastigmine Tartrate Palpitations    Outpatient Encounter Medications as of 05/03/2021  Medication Sig   Cholecalciferol (VITAMIN D3 PO) Take by mouth every morning.   CRANBERRY PO Take 25 mg by mouth daily.   donepezil (ARICEPT) 10 MG tablet TAKE 1 TABLET BY MOUTH AT BEDTIME   fexofenadine (ALLEGRA) 180 MG tablet Take 180 mg by mouth as needed.   fluticasone (FLONASE) 50 MCG/ACT nasal spray Place 1 spray into both nostrils as needed.   Melatonin 10 MG TABS Take 10 mg by mouth daily.   metoprolol succinate (TOPROL-XL) 25 MG 24 hr tablet Take 1 tablet by mouth once daily   Nutritional Supplements (ENSURE ACTIVE HIGH PROTEIN) LIQD Take 1 Can by mouth daily.   polyethylene glycol powder (GLYCOLAX/MIRALAX) 17 GM/SCOOP powder Take 17 g by  mouth daily. Hold for loose stool   sertraline (ZOLOFT) 50 MG tablet Take 1 tablet by mouth once daily   Tdap (BOOSTRIX) 5-2.5-18.5 LF-MCG/0.5 injection Inject 0.5 mLs into the muscle once.   Zoster Vaccine Adjuvanted [facility]) injection Inject 0.5 mLs into the muscle once.   No facility-administered encounter medications on file as of 05/03/2021.    Review of Systems  Unable to perform ROS: Dementia (additional information provided by patient's daughter [name])  Constitutional:  Negative for appetite change, chills, fatigue,  fever and unexpected weight change.  HENT:  Negative for congestion, dental problem, ear discharge, ear pain, facial swelling, hearing loss, nosebleeds, postnasal drip, rhinorrhea, sinus pressure, sinus pain, sneezing, sore throat, tinnitus and trouble swallowing.   Eyes:  Negative for pain, discharge, redness, itching and visual disturbance.  Respiratory:  Negative for cough, chest tightness, shortness of breath and wheezing.   Cardiovascular:  Negative for chest pain, palpitations and leg swelling.  Gastrointestinal:  Negative for abdominal distention, abdominal pain, blood in stool, constipation, diarrhea, nausea and vomiting.  Genitourinary:  Negative for difficulty urinating, dysuria, flank pain, frequency and urgency.  Musculoskeletal:  Positive for arthralgias and gait problem. Negative for back pain, joint swelling, myalgias, neck pain and neck stiffness.  Skin:  Negative for color change, pallor, rash and wound.  Neurological:  Negative for dizziness, syncope, speech difficulty, weakness, light-headedness, numbness and headaches.  Hematological:  Does not bruise/bleed easily.  Psychiatric/Behavioral:  Positive for confusion. Negative for agitation, behavioral problems, hallucinations and sleep disturbance. The patient is not nervous/anxious.    Immunization History  Administered Date(s) Administered   Fluad Quad(high Dose 65+) 03/25/2019, 04/29/2020   Influenza Split 03/27/2011, 04/09/2012   Influenza Whole 03/31/2007, 03/29/2009, 04/12/2009, 03/14/2010   Influenza, High Dose Seasonal PF 04/30/2018   Influenza,inj,Quad PF,6+ Mos 03/31/2013, 04/07/2014, 03/08/2015, 02/23/2016, 06/06/2016, 06/12/2017   Moderna Sars-Covid-2 Vaccination 09/12/2019, 10/14/2019, 05/26/2020, 12/02/2020   PPD Test 06/12/2017, 12/22/2018   Pneumococcal Conjugate-13 08/05/2014   Pneumococcal Polysaccharide-23 02/11/2006   Td 04/18/2005   Zoster, Live 05/26/2013   Pertinent  Health Maintenance Due  Topic  Date Due   INFLUENZA VACCINE  02/06/2021   DEXA SCAN  Completed   Fall Risk 05/27/2020 10/07/2020 11/15/2020 02/22/2021 05/03/2021  Falls in the past year? 0 1 1 0 1  Was there an injury with Fall? 0 1 1 0 0  Fall Risk Category Calculator 0 3 2 0 2  Fall Risk Category Low High Moderate Low Moderate  Patient Fall Risk Level Low fall risk High fall risk Moderate fall risk Low fall risk Moderate fall risk  Patient at Risk for Falls Due to - - - No Fall Risks History of fall(s)  Fall risk Follow up - - - Falls evaluation completed Falls evaluation completed;Education provided;Falls prevention discussed   Functional Status Survey:    Vitals:   05/03/21 1451  BP: 120/88  Pulse: 84  Resp: 16  Temp: 98 F (36.7 C)  SpO2: 90%  Weight: 119 lb 12.8 oz (54.3 kg)  Height: 5\' 6"  (1.676 m)   Body mass index is 19.34 kg/m. Physical Exam Vitals reviewed.  Constitutional:      General: She is not in acute distress.    Appearance: Normal appearance. She is normal weight. She is not ill-appearing or diaphoretic.  HENT:     Head: Normocephalic.     Right Ear: Tympanic membrane, ear canal and external ear normal. There is no impacted cerumen.     Left Ear: Tympanic membrane,  ear canal and external ear normal. There is no impacted cerumen.     Nose: Nose normal. No congestion or rhinorrhea.     Mouth/Throat:     Mouth: Mucous membranes are moist.     Pharynx: Oropharynx is clear. No oropharyngeal exudate or posterior oropharyngeal erythema.  Eyes:     General: No scleral icterus.       Right eye: No discharge.        Left eye: No discharge.     Extraocular Movements: Extraocular movements intact.     Conjunctiva/sclera: Conjunctivae normal.     Pupils: Pupils are equal, round, and reactive to light.  Neck:     Vascular: No carotid bruit.  Cardiovascular:     Rate and Rhythm: Normal rate and regular rhythm.     Pulses: Normal pulses.     Heart sounds: Normal heart sounds. No murmur heard.    No friction rub. No gallop.  Pulmonary:     Effort: Pulmonary effort is normal. No respiratory distress.     Breath sounds: Normal breath sounds. No wheezing, rhonchi or rales.  Chest:     Chest wall: No tenderness.  Abdominal:     General: Bowel sounds are normal. There is no distension.     Palpations: Abdomen is soft. There is no mass.     Tenderness: There is no abdominal tenderness. There is no right CVA tenderness, left CVA tenderness, guarding or rebound.  Musculoskeletal:        General: No swelling or tenderness. Normal range of motion.     Cervical back: Normal range of motion. No rigidity or tenderness.     Right lower leg: No edema.     Left lower leg: No edema.  Lymphadenopathy:     Cervical: No cervical adenopathy.  Skin:    General: Skin is warm and dry.     Coloration: Skin is not pale.     Findings: No bruising, erythema, lesion or rash.  Neurological:     Mental Status: She is alert. Mental status is at baseline. She is disoriented.     Cranial Nerves: No cranial nerve deficit.     Sensory: No sensory deficit.     Motor: No weakness.     Coordination: Coordination normal.     Gait: Gait abnormal.  Psychiatric:        Mood and Affect: Mood normal.        Speech: Speech normal.        Behavior: Behavior normal.        Thought Content: Thought content normal.        Cognition and Memory: Memory is impaired.        Judgment: Judgment normal.    Labs reviewed: Recent Labs    05/16/20 1815 05/27/20 1619 12/15/20 1428  NA 136 137 138  K 3.9 3.8 4.2  CL 102 101 105  CO2 25 28 25   GLUCOSE 96 107 88  BUN 20 18 17   CREATININE 0.85 0.92* 0.91*  CALCIUM 9.2 9.5 9.1   Recent Labs    05/16/20 1815 12/15/20 1428  AST 20 22  ALT 14 15  ALKPHOS 35*  --   BILITOT 0.6 0.5  PROT 7.9 7.5  ALBUMIN 3.8  --    Recent Labs    05/16/20 1815 05/27/20 1619 12/15/20 1428  WBC 5.0 5.5 4.1  NEUTROABS 2.6 3,944 1,861  HGB 11.1* 11.8 10.9*  HCT 34.2* 35.2 33.1*   MCV 100.6* 97.2  96.2  PLT 130* 137* 145   Lab Results  Component Value Date   TSH 3.36 12/15/2020   No results found for: HGBA1C Lab Results  Component Value Date   CHOL 213 (H) 12/15/2020   HDL 69 12/15/2020   LDLCALC 127 (H) 12/15/2020   TRIG 78 12/15/2020   CHOLHDL 3.1 12/15/2020    Significant Diagnostic Results in last 30 days:  No results found.  Assessment/Plan 1. Dementia without behavioral disturbance (HCC) No new behavioral issues reported except not eating well  Will reduce Aricept possible side effects  Continue with supportive care  - continue on sertraline   2. Weight loss, abnormal Has had progressive weight loss 5.2 lbs over 2 months  Adjust Aricept as above  Possible failure to thrive due to dementia since not eating well per daughter.  - daughter advised to given small small amounts of Ensure through out the day instead of drink one whole can at once might encouraged to drink more.  3. Need for influenza vaccination Afebrile Flut shot administered by CMA no acute reaction reported.  - Flu Vaccine QUAD High Dose(Fluad)  Family/ staff Communication: Reviewed plan of care with patient and daughter verbalized understanding   Labs/tests ordered: None   Next Appointment: As needed if symptoms worsen or fail to improve    [name], NP

## 2021-05-04 ENCOUNTER — Other Ambulatory Visit: Payer: Medicare Other

## 2021-05-04 ENCOUNTER — Other Ambulatory Visit: Payer: Self-pay | Admitting: *Deleted

## 2021-05-04 MED ORDER — DONEPEZIL HCL 5 MG PO TABS
5.0000 mg | ORAL_TABLET | Freq: Every day | ORAL | 1 refills | Status: DC
Start: 2021-05-04 — End: 2021-10-03

## 2021-05-04 NOTE — Telephone Encounter (Signed)
Patient daughter, [name] called and stated that [name] Decreased the Donepezil 10mg  to 5mg .   Stated that it is hard to cut the 10mg  in half because it is not scored and  she just does not have time for that.   Wants a Rx sent to pharmacy for Donepezil 5mg .   Pended Rx and sent to [facility] for approval.

## 2021-05-05 ENCOUNTER — Other Ambulatory Visit: Payer: Self-pay

## 2021-05-05 ENCOUNTER — Other Ambulatory Visit: Payer: Medicare Other

## 2021-05-05 VITALS — BP 116/58 | HR 74 | Temp 97.4°F | Resp 20 | Wt 119.0 lb

## 2021-05-05 DIAGNOSIS — Z515 Encounter for palliative care: Secondary | ICD-10-CM

## 2021-05-05 NOTE — Progress Notes (Signed)
COMMUNITY PALLIATIVE CARE SW NOTE  PATIENT NAME: Kathleen Gallegos DOB: 01/23/1926 MRN: [id number]  PRIMARY CARE PROVIDER: [name], NP  RESPONSIBLE PARTY:  Acct ID - Guarantor Home Phone Work Phone Relationship Acct Type  [id number] [name]* [phone]  Self P/F     [place], [place], [place] [address]     PLAN OF CARE and INTERVENTIONS:             GOALS OF CARE/ ADVANCE CARE PLANNING:  Patient is a DNR currently. Patient's middle daughter, [name], has guardianship of patient.   2.    SOCIAL/EMOTIONAL/SPIRITUAL ASSESSMENT/ INTERVENTIONS:  SW and RN met with patient and patient's daughters [name]. Daughters updated PC on medical changes.   Patient sitting at kitchen table eating during visit. Patient recently saw PCP due to poor appetite and advised to continue to encourage supplement drinks. Patient continues with advancing dementia. Appetite continues to be poor. Daughter shares detailed stories of her interactions with patient since previous visit. Daughters explanations reflects disease progression of dementia in patient.   Patient is a fall risk and has multiple falls over the past few months. Most recent fall occurred 10/13, trying to get out of bed. No injuries sustained.   Daughter, [name], shares that she continues having more difficulty with patient due to her declining cognition. Patient is requiring more assistance with supervision during toileting and assistance with hygiene. Patient having less incontinent episodes due to daughter encouraging toilet breaks.    SW assisted daughter in completing Medicaid application for assisted living, SW advised daughter that the process can move swiftly, per DSS LTC caseworker. SW will fax completed application on family's behalf. SW will email patients daughters ALF-MC options in [place] that accepts Medicaid for family to make a decision.    RN checked vitals and reviewed medications. No concerns..    Psychosocial  assessment completed. Patient has adequate food and basic needs met. Patient will benefit from additional personal care assistance in the home. No S/S of depression or anxiety.    SW discussed goals, reviewed care plan, provided emotional support, used active and reflective listening. Palliative care will continue to monitor and assist with long term care planning as needed.   3.         PATIENT/CAREGIVER EDUCATION/ COPING:  Patient alert with confusion. Patient's dementia is progressed. Patient enjoyed sewing and watching westerns. Patient has 3 children and a lot of grandchildren and great grandchildren. Daughter shares that she feels patient is having increased anxiety.  Continued caregiver support provided to daughter.   4.         PERSONAL EMERGENCY PLAN:  Family will call 9-1-1 for emergencies.   5.         COMMUNITY RESOURCES COORDINATION/ HEALTH CARE NAVIGATION:  Patient's daughter manages care and is primary caregiver.   6.         FINANCIAL/LEGAL CONCERNS/INTERVENTIONS: Patient on fixed income, daughter [name], is guardian over finances and person.         SOCIAL HX:  Social History   Tobacco Use   Smoking status: Never   Smokeless tobacco: Former    Types: Snuff  Substance Use Topics   Alcohol use: No    CODE STATUS: DNR ADVANCED DIRECTIVES: Y MOST FORM COMPLETE:  N HOSPICE EDUCATION PROVIDED: N  PPS: Patient is MIN A - supervision with all ADL's and hygiene. Patient is A&O to self only with lack of/poor insight and judgement. Due to advancing dementia.   Time  spent: [place], [place]

## 2021-05-05 NOTE — Progress Notes (Signed)
PATIENT NAME: Kathleen Gallegos DOB: 1925/08/11 MRN: [id number]  PRIMARY CARE PROVIDER: [name], NP  RESPONSIBLE PARTY:  Acct ID - Guarantor Home Phone Work Phone Relationship Acct Type  [id number] [name]* [phone]  Self P/F     [place], [place], [place] [address]    PLAN OF CARE and INTERVENTIONS:               1.  GOALS OF CARE/ ADVANCE CARE PLANNING:  Daughter continues confirm long term placement.               2.  PATIENT/CAREGIVER EDUCATION:  Visiting ALF and support with upcoming placement.               4. PERSONAL EMERGENCY PLAN:  Activate 911 for emergencies               5.  DISEASE STATUS:  Joint visit completed with [name], SW, daughter-[name] and patient.  Purpose of visit is to complete Medicaid application with daughter.  Patient is found in the kitchen completing breakfast.  She will engage in short conversations but this is mostly limited to dress making.  Daughter continues to process through caregiver fatigue.  Active listening provided.  Daughter discussing separating from her mother after years of being a primary caregiver.  She continues to affirm her desire to be active in her mother's care once she is placed.  Ongoing decline in patient's overall condition.  Last PCP visit notes a 5 lb weight loss.  Daughter is providing nutritional supplements.  Patient has sustained 2 falls since our last visit.  Daughter is providing more care to patient due to incontinence episodes and patient requiring more assistance with dressing and hygiene.   SW provided update on Medicaid application and process.    HISTORY OF PRESENT ILLNESS:  87 year old female with Dementia.  Patient is being followed by Palliative Care monthly and PRN.  CODE STATUS: Full ADVANCED DIRECTIVES: No MOST FORM: No PPS: 50%   PHYSICAL EXAM:   VITALS: Today's Vitals   05/05/21 1409  BP: (!) 116/58  Pulse: 74  Resp: 20  Temp: (!) 97.4 F (36.3 C)  SpO2: 97%  Weight: 119 lb  (54 kg)    LUNGS: clear to auscultation  CARDIAC: Cor RRR}  EXTREMITIES: - for edema SKIN: Skin color, texture, turgor normal. No rashes or lesions or normal  NEURO: positive for gait problems and memory problems       Lorenza Burton, RN

## 2021-05-09 ENCOUNTER — Telehealth: Payer: Self-pay

## 2021-05-09 NOTE — Telephone Encounter (Signed)
PC SW faxed completed [facility] application to [facility] att: Konrad Felix: [phone] for review for ALF placement assistance.

## 2021-05-25 ENCOUNTER — Telehealth: Payer: Self-pay

## 2021-05-25 NOTE — Telephone Encounter (Signed)
05/25/2021 @ 1:54 PM: PC SW attempted to return patients daughter, Orlean Patten to SW.  Call unsuccessful. SW LVM. Awaiting return call.

## 2021-06-05 ENCOUNTER — Telehealth: Payer: Self-pay

## 2021-06-05 NOTE — Telephone Encounter (Signed)
06/05/21 @12 :30 PM: PC SW outreached patients daughter, [name].   Daughter shares that she has received follow up paperwork form [place] caseworker, Emilee Hero, requesting additional info on patient income and guardianship paperwork as well as attempting to scheduled an appt for patient. Daughter has Librarian, academic and LVM requesting a call back for clarification on requested items.   Daughter also shared that she has received additional info from the [place] where, SW sent application. Daughter will review those items and let SW know that outcome.

## 2021-06-11 DIAGNOSIS — R404 Transient alteration of awareness: Secondary | ICD-10-CM | POA: Diagnosis not present

## 2021-06-11 DIAGNOSIS — R5381 Other malaise: Secondary | ICD-10-CM | POA: Diagnosis not present

## 2021-06-11 DIAGNOSIS — R197 Diarrhea, unspecified: Secondary | ICD-10-CM | POA: Diagnosis not present

## 2021-06-12 ENCOUNTER — Other Ambulatory Visit: Payer: Self-pay

## 2021-06-12 ENCOUNTER — Emergency Department (HOSPITAL_COMMUNITY)
Admission: EM | Admit: 2021-06-12 | Discharge: 2021-06-13 | Disposition: A | Discharge status: Home / Self Care | Payer: Medicare Other | Attending: Emergency Medicine | Admitting: Emergency Medicine

## 2021-06-12 DIAGNOSIS — F0390 Unspecified dementia without behavioral disturbance: Secondary | ICD-10-CM | POA: Insufficient documentation

## 2021-06-12 DIAGNOSIS — Z743 Need for continuous supervision: Secondary | ICD-10-CM | POA: Diagnosis not present

## 2021-06-12 DIAGNOSIS — J45909 Unspecified asthma, uncomplicated: Secondary | ICD-10-CM | POA: Diagnosis not present

## 2021-06-12 DIAGNOSIS — N1831 Chronic kidney disease, stage 3a: Secondary | ICD-10-CM | POA: Diagnosis not present

## 2021-06-12 DIAGNOSIS — Z87891 Personal history of nicotine dependence: Secondary | ICD-10-CM | POA: Insufficient documentation

## 2021-06-12 DIAGNOSIS — F03B18 Unspecified dementia, moderate, with other behavioral disturbance: Secondary | ICD-10-CM | POA: Insufficient documentation

## 2021-06-12 DIAGNOSIS — R6889 Other general symptoms and signs: Secondary | ICD-10-CM | POA: Diagnosis not present

## 2021-06-12 DIAGNOSIS — Z79899 Other long term (current) drug therapy: Secondary | ICD-10-CM | POA: Diagnosis not present

## 2021-06-12 DIAGNOSIS — R404 Transient alteration of awareness: Secondary | ICD-10-CM | POA: Diagnosis not present

## 2021-06-12 DIAGNOSIS — K625 Hemorrhage of anus and rectum: Secondary | ICD-10-CM | POA: Insufficient documentation

## 2021-06-12 DIAGNOSIS — N9089 Other specified noninflammatory disorders of vulva and perineum: Secondary | ICD-10-CM

## 2021-06-12 DIAGNOSIS — I129 Hypertensive chronic kidney disease with stage 1 through stage 4 chronic kidney disease, or unspecified chronic kidney disease: Secondary | ICD-10-CM | POA: Diagnosis not present

## 2021-06-12 DIAGNOSIS — F631 Pyromania: Secondary | ICD-10-CM | POA: Diagnosis not present

## 2021-06-12 DIAGNOSIS — N939 Abnormal uterine and vaginal bleeding, unspecified: Secondary | ICD-10-CM | POA: Diagnosis not present

## 2021-06-12 LAB — CBC WITH DIFFERENTIAL/PLATELET
Abs Immature Granulocytes: 0.01 10*3/uL (ref 0.00–0.07)
Basophils Absolute: 0 10*3/uL (ref 0.0–0.1)
Basophils Relative: 0 %
Eosinophils Absolute: 0.1 10*3/uL (ref 0.0–0.5)
Eosinophils Relative: 1 %
HCT: 35 % — ABNORMAL LOW (ref 36.0–46.0)
Hemoglobin: 11.4 g/dL — ABNORMAL LOW (ref 12.0–15.0)
Immature Granulocytes: 0 %
Lymphocytes Relative: 24 %
Lymphs Abs: 1.6 10*3/uL (ref 0.7–4.0)
MCH: 33.3 pg (ref 26.0–34.0)
MCHC: 32.6 g/dL (ref 30.0–36.0)
MCV: 102.3 fL — ABNORMAL HIGH (ref 80.0–100.0)
Monocytes Absolute: 0.7 10*3/uL (ref 0.1–1.0)
Monocytes Relative: 10 %
Neutro Abs: 4.4 10*3/uL (ref 1.7–7.7)
Neutrophils Relative %: 65 %
Platelets: 153 10*3/uL (ref 150–400)
RBC: 3.42 MIL/uL — ABNORMAL LOW (ref 3.87–5.11)
RDW: 13.2 % (ref 11.5–15.5)
WBC: 6.7 10*3/uL (ref 4.0–10.5)
nRBC: 0 % (ref 0.0–0.2)

## 2021-06-12 LAB — BASIC METABOLIC PANEL
Anion gap: 9 (ref 5–15)
BUN: 20 mg/dL (ref 8–23)
CO2: 25 mmol/L (ref 22–32)
Calcium: 9 mg/dL (ref 8.9–10.3)
Chloride: 102 mmol/L (ref 98–111)
Creatinine, Ser: 0.98 mg/dL (ref 0.44–1.00)
GFR, Estimated: 54 mL/min — ABNORMAL LOW (ref >60)
Glucose, Bld: 74 mg/dL (ref 70–99)
Potassium: 4.2 mmol/L (ref 3.5–5.1)
Sodium: 136 mmol/L (ref 135–145)

## 2021-06-12 LAB — WET PREP, GENITAL
Clue Cells Wet Prep HPF POC: NONE SEEN
Sperm: NONE SEEN
Trich, Wet Prep: NONE SEEN
WBC, Wet Prep HPF POC: <10 (ref <10)
Yeast Wet Prep HPF POC: NONE SEEN

## 2021-06-12 MED ORDER — METRONIDAZOLE 500 MG PO TABS
500.0000 mg | ORAL_TABLET | Freq: Once | ORAL | Status: AC
  Administered 2021-06-12: 500 mg via ORAL
  Filled 2021-06-12: qty 1

## 2021-06-12 MED ORDER — LORAZEPAM 2 MG/ML IJ SOLN
INTRAMUSCULAR | Status: AC
  Filled 2021-06-12: qty 1

## 2021-06-12 MED ORDER — CEPHALEXIN 500 MG PO CAPS
500.0000 mg | ORAL_CAPSULE | Freq: Once | ORAL | Status: AC
  Administered 2021-06-12: 500 mg via ORAL
  Filled 2021-06-12: qty 1

## 2021-06-12 MED ORDER — ONDANSETRON HCL 4 MG/2ML IJ SOLN
INTRAMUSCULAR | Status: AC
  Filled 2021-06-12: qty 2

## 2021-06-12 NOTE — ED Provider Notes (Signed)
[facility] Provider Note   CSN: [id number] Arrival date & time: 06/12/21  1544     History Chief Complaint  Patient presents with   Rectal Bleeding    Kathleen Gallegos is a 87 y.o. female.  HPI She is here for suspected rectal bleeding, noticed by family member.  She has a prior history of a vaginal infection with similar problem.  Patient has been otherwise well.  She is unable to give any history.  Level 5 caveat-confusion    Past Medical History:  Diagnosis Date   Allergic rhinitis, cause unspecified    Angioneurotic edema not elsewhere classified    Anxiety disorder    Asthma    Carotid bruit    Carpal tunnel syndrome    Chronic abdominal pain    Fecal incontinence    GERD (gastroesophageal reflux disease)    Hyperlipidemia    IBS (irritable bowel syndrome)    Insomnia    Lung nodule    Osteoarthritis    Palpitations    Pancytopenia ([place])    Senile osteoporosis     Patient Active Problem List   Diagnosis Date Noted   Benign hypertension with chronic kidney disease, stage III ([place]) 02/22/2021   Chronic kidney disease, stage 3a ([place]) 02/22/2021   Anemia due to stage 3a chronic kidney disease ([place]) 02/22/2021   Unspecified symptoms and signs involving cognitive functions and awareness 12/24/2016   Vitamin D deficiency 03/12/2015   Dementia ([place]) 01/04/2015   Osteoporosis 08/05/2014   Medicare annual wellness visit, subsequent 04/07/2014   Cough variant asthma 07/28/2013   Dry skin dermatitis 02/15/2013   HEMORRHOIDS, WITH BLEEDING 07/27/2009   TRANSIENT ISCHEMIC ATTACK 03/29/2009   PALPITATIONS 03/25/2009   IRRITABLE BOWEL SYNDROME 10/05/2008   FECAL INCONTINENCE 10/05/2008   GERD 05/24/2008   Hyperlipemia 11/10/2007   Osteoarthritis 11/10/2007   Allergic rhinitis due to pollen 09/18/2007   LUNG NODULE 09/18/2007    Past Surgical History:  Procedure Laterality Date   ABDOMINAL HYSTERECTOMY  1979   fibroids    bilateral  cataract surgery  2008   BIOPSY THYROID  1970    COLONOSCOPY  12/21/04   normal -redundant    EUS  08/07/07   EYE SURGERY       OB History   No obstetric history on file.     Family History  Problem Relation Age of Onset   Hypertension Mother    Pancreatic cancer Father    Cancer Brother    Depression Daughter    Heart disease Sister    Leukemia Other        family history    Colon cancer Other        family history     Social History   Tobacco Use   Smoking status: Never   Smokeless tobacco: Former    Types: Snuff  Vaping Use   Vaping Use: Never used  Substance Use Topics   Alcohol use: No   Drug use: Not Currently    Home Medications Prior to Admission medications   Medication Sig Start Date End Date Taking? Authorizing Provider  cephALEXin (KEFLEX) 500 MG capsule Take 1 capsule (500 mg total) by mouth 4 (four) times daily. 06/13/21  Yes Daleen Bo, MD  metroNIDAZOLE (FLAGYL) 500 MG tablet Take 1 tablet (500 mg total) by mouth 2 (two) times daily. One po bid x 7 days 06/13/21  Yes Daleen Bo, MD  Cholecalciferol (VITAMIN D3 PO) Take by mouth every morning.  [provider]  CRANBERRY PO Take 25 mg by mouth daily.    [provider]  donepezil (ARICEPT) 5 MG tablet Take 1 tablet (5 mg total) by mouth at bedtime. 05/04/21   Ngetich, [name] C, NP  fexofenadine (ALLEGRA) 180 MG tablet Take 180 mg by mouth as needed.    [provider]  fluticasone (FLONASE) 50 MCG/ACT nasal spray Place 1 spray into both nostrils as needed.    [provider]  Melatonin 10 MG TABS Take 10 mg by mouth daily.    [provider]  metoprolol succinate (TOPROL-XL) 25 MG 24 hr tablet Take 1 tablet by mouth once daily 11/14/20   Ngetich, [name] C, NP  Nutritional Supplements (ENSURE ACTIVE HIGH PROTEIN) LIQD Take 1 Can by mouth daily. 02/22/21   Ngetich, [name] C, NP  polyethylene glycol powder (GLYCOLAX/MIRALAX) 17 GM/SCOOP powder Take 17 g by  mouth daily. Hold for loose stool 06/16/20   Ngetich, [name] C, NP  sertraline (ZOLOFT) 50 MG tablet Take 1 tablet by mouth once daily 12/06/20   Ngetich, [name] C, NP  Tdap (BOOSTRIX) 5-2.5-18.5 LF-MCG/0.5 injection Inject 0.5 mLs into the muscle once.    [provider]  Zoster Vaccine Adjuvanted Scnetx) injection Inject 0.5 mLs into the muscle once.    [provider]    Allergies    Diphenhydramine hcl and Rivastigmine tartrate  Review of Systems   Review of Systems  Unable to perform ROS: Mental status change   Physical Exam Updated Vital Signs BP 118/77   Pulse 73   Temp 98 F (36.7 C) (Oral)   Resp 16   Ht 5\' 6"  (1.676 m)   Wt 54 kg   SpO2 100%   BMI 19.21 kg/m   Physical Exam Vitals and nursing note reviewed.  Constitutional:      General: She is not in acute distress.    Appearance: She is well-developed. She is not ill-appearing.     Comments: Elderly, frail  HENT:     Head: Normocephalic and atraumatic.     Right Ear: External ear normal.     Left Ear: External ear normal.  Eyes:     Conjunctiva/sclera: Conjunctivae normal.     Pupils: Pupils are equal, round, and reactive to light.  Neck:     Trachea: Phonation normal.  Cardiovascular:     Rate and Rhythm: Normal rate and regular rhythm.     Heart sounds: No murmur heard. Pulmonary:     Effort: Pulmonary effort is normal. No respiratory distress.     Breath sounds: Normal breath sounds.  Abdominal:     General: There is no distension.     Palpations: Abdomen is soft.     Tenderness: There is no abdominal tenderness.  Genitourinary:    Comments: Normal anus.  Small amount of brown stool in rectal vault.  Stool for rectal vault was guaiac positive.  There is blood on the buttocks, bilaterally and blood on the anterior perineum.  This blood appears to be coming from the vagina and is associated with a mucoid drainage.  The vagina opening is very atrophic.  There are no distinct lesions of  the external genitalia.  A swab was obtained from the vaginal orifice, and sent for wet prep testing. Musculoskeletal:        General: No swelling. Normal range of motion.     Cervical back: Normal range of motion and neck supple.  Skin:    General: Skin is  warm and dry.     Capillary Refill: Capillary refill takes less than 2 seconds.  Neurological:     Mental Status: She is alert and oriented to person, place, and time.     Cranial Nerves: No cranial nerve deficit.     Sensory: No sensory deficit.     Motor: No abnormal muscle tone.     Coordination: Coordination normal.  Psychiatric:        Mood and Affect: Mood normal.        Behavior: Behavior normal.        Thought Content: Thought content normal.        Judgment: Judgment normal.    ED Results / Procedures / Treatments   Labs (all labs ordered are listed, but only abnormal results are displayed) Labs Reviewed  BASIC METABOLIC PANEL - Abnormal; Notable for the following components:      Result Value   GFR, Estimated 54 (*)    All other components within normal limits  CBC WITH DIFFERENTIAL/PLATELET - Abnormal; Notable for the following components:   RBC 3.42 (*)    Hemoglobin 11.4 (*)    HCT 35.0 (*)    MCV 102.3 (*)    All other components within normal limits  WET PREP, GENITAL  POC OCCULT BLOOD, ED    EKG None  Radiology No results found.  Procedures Procedures   Medications Ordered in ED Medications  metroNIDAZOLE (FLAGYL) tablet 500 mg (500 mg Oral Given 06/12/21 2213)  cephALEXin (KEFLEX) capsule 500 mg (500 mg Oral Given 06/12/21 2213)    ED Course  I have reviewed the triage vital signs and the nursing notes.  Pertinent labs & imaging results that were available during my care of the patient were reviewed by me and considered in my medical decision making (see chart for details).    MDM Rules/Calculators/A&P                            Patient Vitals for the past 24 hrs:  BP Temp Temp src  Pulse Resp SpO2 Height Weight  06/13/21 0100 118/77 -- -- 73 -- 100 % -- --  06/13/21 0030 119/79 -- -- 73 16 98 % -- --  06/12/21 2330 (!) 143/78 -- -- 64 15 100 % -- --  06/12/21 2300 (!) 145/80 -- -- 70 16 100 % -- --  06/12/21 2200 (!) 141/77 -- -- 68 14 100 % -- --  06/12/21 2130 129/79 -- -- 64 14 100 % -- --  06/12/21 2100 (!) 142/84 -- -- 73 20 100 % -- --  06/12/21 2030 (!) 145/72 -- -- 63 -- 97 % -- --  06/12/21 2000 139/81 -- -- 64 20 100 % -- --  06/12/21 1930 133/78 -- -- 62 20 100 % -- --  06/12/21 1900 134/83 -- -- 60 -- 100 % -- --  06/12/21 1830 118/77 -- -- 63 -- 100 % -- --  06/12/21 1800 123/74 -- -- 62 20 100 % -- --  06/12/21 1730 137/71 -- -- 63 -- 100 % -- --  06/12/21 1651 -- -- -- 64 -- 100 % 5\' 6"  (1.676 m) 54 kg  06/12/21 1600 125/81 98 F (36.7 C) Oral 85 16 100 % -- --    At the time of discharge- reevaluation with update and discussion. After initial assessment and treatment, an updated evaluation reveals no change in status, findings discussed and questions answered.  Daleen Bo   Medical Decision Making:  This patient is presenting for evaluation of suspected rectal bleeding, which does require a range of treatment options, and is a complaint that involves a moderate risk of morbidity and mortality. The differential diagnoses include rectal bleeding, vaginal bleeding. I decided to review old records, and in summary elderly female presenting for family concern of rectal bleeding.  I obtained additional historical information from daughter at bedside.  Clinical Laboratory Tests Ordered, included  wet prep from vagina . Review indicates normal.  Critical Interventions-clinical evaluation, laboratory testing  After These Interventions, the Patient was reevaluated and was found stable for discharge.  Patient stool brown in color.  Blood on perineum likely from vaginal and/or perineal irritation.  Possible bacterial related.  Difficult patient to examine  because of atrophy and on flexibility.  We will give treatment with Keflex and Flagyl, with plans for reassessment by PCP after changing perineal care at home.  CRITICAL CARE-no Performed by: Daleen Bo  Nursing Notes Reviewed/ Care Coordinated Applicable Imaging Reviewed Interpretation of Laboratory Data incorporated into ED treatment  The patient appears reasonably screened and/or stabilized for discharge and I doubt any other medical condition or other [facility] requiring further screening, evaluation, or treatment in the ED at this time prior to discharge.  Plan: Home Medications-continue current; Home Treatments-frequent perineal care with warm compresses and cleansing; return here if the recommended treatment, does not improve the symptoms; Recommended follow up-PCP checkup 1 week     Final Clinical Impression(s) / ED Diagnoses Final diagnoses:  Female perineal bleeding    Rx / DC Orders ED Discharge Orders          Ordered    metroNIDAZOLE (FLAGYL) 500 MG tablet  2 times daily        06/13/21 0052    cephALEXin (KEFLEX) 500 MG capsule  4 times daily        06/13/21 0052             Daleen Bo, MD 06/13/21 [phone]

## 2021-06-12 NOTE — ED Triage Notes (Signed)
Sister reports that patient may have blood in her stool.

## 2021-06-13 ENCOUNTER — Telehealth: Payer: Self-pay

## 2021-06-13 ENCOUNTER — Emergency Department (HOSPITAL_COMMUNITY)
Admission: EM | Admit: 2021-06-13 | Discharge: 2021-06-14 | Disposition: A | Discharge status: Home / Self Care | Payer: Medicare Other | Source: Home / Self Care | Attending: Emergency Medicine | Admitting: Emergency Medicine

## 2021-06-13 DIAGNOSIS — I129 Hypertensive chronic kidney disease with stage 1 through stage 4 chronic kidney disease, or unspecified chronic kidney disease: Secondary | ICD-10-CM | POA: Insufficient documentation

## 2021-06-13 DIAGNOSIS — F03B18 Unspecified dementia, moderate, with other behavioral disturbance: Secondary | ICD-10-CM

## 2021-06-13 DIAGNOSIS — N1831 Chronic kidney disease, stage 3a: Secondary | ICD-10-CM | POA: Insufficient documentation

## 2021-06-13 DIAGNOSIS — Z79899 Other long term (current) drug therapy: Secondary | ICD-10-CM | POA: Insufficient documentation

## 2021-06-13 DIAGNOSIS — R58 Hemorrhage, not elsewhere classified: Secondary | ICD-10-CM | POA: Diagnosis not present

## 2021-06-13 DIAGNOSIS — R0902 Hypoxemia: Secondary | ICD-10-CM | POA: Diagnosis not present

## 2021-06-13 DIAGNOSIS — Z743 Need for continuous supervision: Secondary | ICD-10-CM | POA: Diagnosis not present

## 2021-06-13 DIAGNOSIS — D631 Anemia in chronic kidney disease: Secondary | ICD-10-CM | POA: Insufficient documentation

## 2021-06-13 DIAGNOSIS — J45909 Unspecified asthma, uncomplicated: Secondary | ICD-10-CM | POA: Insufficient documentation

## 2021-06-13 DIAGNOSIS — K625 Hemorrhage of anus and rectum: Secondary | ICD-10-CM | POA: Diagnosis not present

## 2021-06-13 MED ORDER — METRONIDAZOLE 500 MG PO TABS: 500.0000 mg | ORAL_TABLET | Freq: Two times a day (BID) | ORAL | 0 refills | Status: DC

## 2021-06-13 MED ORDER — CEPHALEXIN 500 MG PO CAPS: 500.0000 mg | ORAL_CAPSULE | Freq: Four times a day (QID) | ORAL | 0 refills | Status: DC

## 2021-06-13 NOTE — ED Notes (Signed)
Assisted Dr. Eulis Foster with perineal exam. Pt tolerated well.

## 2021-06-13 NOTE — ED Notes (Addendum)
Family [name] from [place]- called to ask about patient.  [phone]. Michela Pitcher that the family that is close by to take care of the patient needs more help. They said that they need our SW to help get involved to help start home health quicker than their SW. Said they they need help or more education on getting her to take her medication. Said that they struggle with getting her to take all of them. Might even need eventual facility placement. "Need someone that knows how to handle her."  However if EDP feels that patient can bed/c then [name] will be the one to pick her up.

## 2021-06-13 NOTE — Telephone Encounter (Signed)
06/13/21 @2  PM: PC SW outreached patients daughter, [name], to return her TC.  Call unsuccessful. SW LVM with contact information. Awaiting return call.

## 2021-06-13 NOTE — Discharge Instructions (Signed)
We are prescribing antibiotics to help treat a suspected vaginal infection which is causing bleeding from the vagina.  Start taking the antibiotics, tomorrow morning.  It would be good to have her soak in a bathtub once or twice a day.  If she cannot do that, use a warm compress on the area around the vaginal opening, several times a day.  This can help keep the area clean.  Careful when helping her to clean up to avoid irritation of the area.  Make sure you follow-up with her doctor for checkup and a week or so.

## 2021-06-13 NOTE — ED Provider Notes (Signed)
[facility] Provider Note   CSN: [id number] Arrival date & time: 06/13/21  2001     History Chief Complaint  Patient presents with   Family Unable to Care For Her    Kathleen Gallegos is a 87 y.o. female.  HPI Family members brought her back to the ED today because they are having trouble taking care of her.  They have engaged a Education officer, museum through home setting but been unable to get help quickly enough.  They are requesting hospital social work to help them.  According to EMR, [facility] social worker tried to call the daughter Kathleen Gallegos but no one answered.  Apparently they are awaiting a return call.  This problem with family distress over the patient's status has been ongoing.  On 05/05/2021, conversations were undertaken regarding patient was begun.    Level 5 caveat-dementia  Past Medical History:  Diagnosis Date   Allergic rhinitis, cause unspecified    Angioneurotic edema not elsewhere classified    Anxiety disorder    Asthma    Carotid bruit    Carpal tunnel syndrome    Chronic abdominal pain    Fecal incontinence    GERD (gastroesophageal reflux disease)    Hyperlipidemia    IBS (irritable bowel syndrome)    Insomnia    Lung nodule    Osteoarthritis    Palpitations    Pancytopenia ([place])    Senile osteoporosis     Patient Active Problem List   Diagnosis Date Noted   Benign hypertension with chronic kidney disease, stage III ([place]) 02/22/2021   Chronic kidney disease, stage 3a ([place]) 02/22/2021   Anemia due to stage 3a chronic kidney disease ([place]) 02/22/2021   Unspecified symptoms and signs involving cognitive functions and awareness 12/24/2016   Vitamin D deficiency 03/12/2015   Dementia ([place]) 01/04/2015   Osteoporosis 08/05/2014   Medicare annual wellness visit, subsequent 04/07/2014   Cough variant asthma 07/28/2013   Dry skin dermatitis 02/15/2013   HEMORRHOIDS, WITH BLEEDING 07/27/2009   TRANSIENT ISCHEMIC  ATTACK 03/29/2009   PALPITATIONS 03/25/2009   IRRITABLE BOWEL SYNDROME 10/05/2008   FECAL INCONTINENCE 10/05/2008   GERD 05/24/2008   Hyperlipemia 11/10/2007   Osteoarthritis 11/10/2007   Allergic rhinitis due to pollen 09/18/2007   LUNG NODULE 09/18/2007    Past Surgical History:  Procedure Laterality Date   ABDOMINAL HYSTERECTOMY  1979   fibroids    bilateral cataract surgery  2008   BIOPSY THYROID  1970    COLONOSCOPY  12/21/04   normal -redundant    EUS  08/07/07   EYE SURGERY       OB History   No obstetric history on file.     Family History  Problem Relation Age of Onset   Hypertension Mother    Pancreatic cancer Father    Cancer Brother    Depression Daughter    Heart disease Sister    Leukemia Other        family history    Colon cancer Other        family history     Social History   Tobacco Use   Smoking status: Never   Smokeless tobacco: Former    Types: Snuff  Vaping Use   Vaping Use: Never used  Substance Use Topics   Alcohol use: No   Drug use: Not Currently    Home Medications Prior to Admission medications   Medication Sig Start Date End Date Taking? Authorizing Provider  cephALEXin (KEFLEX) 500 MG capsule Take 1 capsule (500 mg total) by mouth 4 (four) times daily. 06/13/21  Yes Daleen Bo, MD  Cholecalciferol (VITAMIN D3 PO) Take by mouth every morning.   Yes [provider]  CRANBERRY PO Take 25 mg by mouth daily.   Yes [provider]  donepezil (ARICEPT) 5 MG tablet Take 1 tablet (5 mg total) by mouth at bedtime. 05/04/21  Yes Ngetich, [name] C, NP  fexofenadine (ALLEGRA) 180 MG tablet Take 180 mg by mouth as needed.   Yes [provider]  fluticasone (FLONASE) 50 MCG/ACT nasal spray Place 1 spray into both nostrils as needed.   Yes [provider]  Melatonin 10 MG TABS Take 10 mg by mouth daily.   Yes [provider]  metoprolol succinate (TOPROL-XL) 25 MG 24 hr tablet Take 1 tablet by  mouth once daily 11/14/20  Yes Ngetich, [name] C, NP  metroNIDAZOLE (FLAGYL) 500 MG tablet Take 1 tablet (500 mg total) by mouth 2 (two) times daily. One po bid x 7 days 06/13/21  Yes Daleen Bo, MD  polyethylene glycol powder (GLYCOLAX/MIRALAX) 17 GM/SCOOP powder Take 17 g by mouth daily. Hold for loose stool Patient taking differently: Take 17 g by mouth. Hold for loose stool 06/16/20  Yes Ngetich, [name] C, NP  sertraline (ZOLOFT) 50 MG tablet Take 1 tablet by mouth once daily 12/06/20  Yes Ngetich, [name] C, NP  Nutritional Supplements (ENSURE ACTIVE HIGH PROTEIN) LIQD Take 1 Can by mouth daily. Patient not taking: Reported on 06/13/2021 02/22/21   Ngetich, [name] C, NP  Tdap (BOOSTRIX) 5-2.5-18.5 LF-MCG/0.5 injection Inject 0.5 mLs into the muscle once.    [provider]  Zoster Vaccine Adjuvanted [facility] injection Inject 0.5 mLs into the muscle once.    [provider]    Allergies    Diphenhydramine hcl and Rivastigmine tartrate  Review of Systems   Review of Systems  Unable to perform ROS: Dementia   Physical Exam Updated Vital Signs BP 118/83   Pulse 67   Temp 98 F (36.7 C) (Oral)   Resp 20   SpO2 99%   Physical Exam Vitals and nursing note reviewed.  Constitutional:      Appearance: She is well-developed. She is not ill-appearing.  HENT:     Head: Normocephalic and atraumatic.     Right Ear: External ear normal.     Left Ear: External ear normal.     Nose: No congestion.  Eyes:     Conjunctiva/sclera: Conjunctivae normal.     Pupils: Pupils are equal, round, and reactive to light.  Neck:     Trachea: Phonation normal.  Cardiovascular:     Rate and Rhythm: Normal rate.  Pulmonary:     Effort: Pulmonary effort is normal.  Abdominal:     General: There is no distension.  Genitourinary:    Comments: Atrophic external female genitalia.  No blood from the vaginal orifice.  No active bleeding of the perineum.  There is dried blood on the pelvic area  and lower abdominal wall, source is not clear. Musculoskeletal:        General: Normal range of motion.     Cervical back: Normal range of motion and neck supple.  Skin:    General: Skin is warm and dry.  Neurological:     Mental Status: She is alert and oriented to person, place, and time.     Cranial Nerves: No cranial nerve deficit.  Sensory: No sensory deficit.     Motor: No abnormal muscle tone.     Coordination: Coordination normal.  Psychiatric:        Behavior: Behavior normal.        Thought Content: Thought content normal.        Judgment: Judgment normal.    ED Results / Procedures / Treatments   Labs (all labs ordered are listed, but only abnormal results are displayed) Labs Reviewed - No data to display  EKG None  Radiology No results found.  Procedures Procedures   Medications Ordered in ED Medications - No data to display  ED Course  I have reviewed the triage vital signs and the nursing notes.  Pertinent labs & imaging results that were available during my care of the patient were reviewed by me and considered in my medical decision making (see chart for details).  Clinical Course as of 06/13/21 2256  Tue Jun 13, 2021  2153 I attempted to reach her daughter Kathleen Gallegos, but no one answered.  I left a message for Kathleen Gallegos to call back. [EW]  2153 I have ordered home health face-to-face evaluation, comprehensive.  Patient currently sees a Air traffic controller from that facility tried to call them today but the family did not answer. [EW]  2236 I have reached out to 2 of the patient's daughters, Kathleen Gallegos and Kathleen Gallegos, no answers at the numbers, and I have left messages but not heard back from them yet. [EW]  2255 Daughter Kathleen Gallegos responded by telephone.  I explained to her that her mother was ready to come home.  She stated she wanted the patient placed in rehab.  I explained that that could not happen from the ED but it could happen from home, through the care  services that the patient is currently receiving.  At that point Kathleen Gallegos was agreeable and said she would come and get the patient. [EW]    Clinical Course User Index [EW] Daleen Bo, MD   MDM Rules/Calculators/A&P                            Patient Vitals for the past 24 hrs:  BP Temp Temp src Pulse Resp SpO2  06/13/21 2130 118/83 -- -- 67 20 99 %  06/13/21 2100 97/85 -- -- 71 -- 100 %  06/13/21 2030 116/78 -- -- 75 -- 100 %  06/13/21 2017 -- -- -- 76 -- 99 %  06/13/21 2009 -- -- -- 82 -- 100 %  06/13/21 2008 -- -- -- 79 -- 99 %  06/13/21 2007 -- 98 F (36.7 C) Oral 78 14 99 %  06/13/21 2006 (!) 141/76 -- -- -- -- --    10:56 PM Reevaluation with update and discussion. After initial assessment and treatment, an updated evaluation reveals no change in status.  I have communicated with the patient's daughter Kathleen Gallegos who states she will come and get the patient. Daleen Bo   Medical Decision Making:  This patient is presenting for evaluation of difficult to manage patient, which does require a range of treatment options, and is a complaint that involves a moderate risk of morbidity and mortality. The differential diagnoses include progressive dementia, stable dementia, difficult home situation. I decided to review old records, and in summary elderly female, living at home with family members who sent her here to get more help.  She was evaluated by me yesterday and treated for a presumed vaginal infection  with antibiotics.  At that time she had bright red blood with mucus on the perineum.  It was felt that that was coming from the vagina..  I required additional historical information from family members, and contacted Kathleen Gallegos her daughter.    Critical Interventions-clinical evaluation  After These Interventions, the Patient was reevaluated and was found stable, no change significant change from yesterday.  No increased perineal bleeding or findings concerning for acute medical  decompensation.  Family is clearly struggling with this patient's dementia, despite getting some help from home health services.  There is no indication for requirement for additional emergency department evaluation or hospitalization.  We will continue to attempt to contact family members to retrieve the patient, and continue the current home health plan.  CRITICAL CARE-no Performed by: Daleen Bo  Nursing Notes Reviewed/ Care Coordinated Applicable Imaging Reviewed Interpretation of Laboratory Data incorporated into ED treatment  The patient appears reasonably screened and/or stabilized for discharge and I doubt any other medical condition or other [facility] requiring further screening, evaluation, or treatment in the ED at this time prior to discharge.  Plan: Home Medications-usual; Home Treatments-perineal care as suggested yesterday; return here if the recommended treatment, does not improve the symptoms; Recommended follow up-PCP for ongoing management soon as possible.     Final Clinical Impression(s) / ED Diagnoses Final diagnoses:  Moderate dementia with other behavioral disturbance, unspecified dementia type    Rx / DC Orders ED Discharge Orders     None        Daleen Bo, MD 06/13/21 2257

## 2021-06-13 NOTE — ED Triage Notes (Signed)
EMS reports that sister is unable to take care of her.

## 2021-06-13 NOTE — Discharge Instructions (Addendum)
Continue to follow with your usual care providers including home health services and PCP.  Continue to clean the area around her lower abdomen and vagina with soap and water daily.  Continue to take the antibiotics as currently prescribed.

## 2021-06-13 NOTE — ED Notes (Signed)
Pt was given two blankets and was pulled up in bed to better position pt for comfort. Kathleen Gallegos

## 2021-06-14 ENCOUNTER — Telehealth: Payer: Self-pay

## 2021-06-14 ENCOUNTER — Telehealth: Payer: Self-pay | Admitting: *Deleted

## 2021-06-14 DIAGNOSIS — Z515 Encounter for palliative care: Secondary | ICD-10-CM

## 2021-06-14 NOTE — Telephone Encounter (Signed)
06/14/21 @11  AM : PC SW attempted to outreach patients daughter, [name], following up from patients ED visit.  Call unsuccessful. Line busy to home phone. SW contacted daughters cell number and LVM. Awaiting return call.

## 2021-06-14 NOTE — Telephone Encounter (Signed)
Ok to fill out per [employer].   Filled out and placed in [name]'s folder to review and sign.

## 2021-06-14 NOTE — Telephone Encounter (Signed)
06/14/21 @11  AM: PC SW outreached the following facilities with the following response:  [facility], Admissions F: [phone]) - Has a bed available and needs a current FL2, recent MD note and a [facility] Rachel Moulds in admissions shared that they do have a bed available and requested FL2, recent MD note and med list be faxed to [facility] -  no beds due to staffing,   [facility] admissions) -  Does not have a bed available today, but requested SW to call back Friday

## 2021-06-14 NOTE — Telephone Encounter (Signed)
FL2 Form Faxed.

## 2021-06-14 NOTE — ED Notes (Signed)
Pt dry and dressed. Stood and assisted in wheelchair. Went over d/c papers with family. All questions answered. Assisted in the car.

## 2021-06-14 NOTE — Telephone Encounter (Signed)
06/14/21: PC SW outreach to PCP to obtain an FL2 for patient. As patient has had a couple of ED visits this week and daughter expressed to hospital hat she can not care for patient at home at this time and requested SNF placement for patient.   [facility] who has bed availability but is in need of FL2 and recent MD note.

## 2021-06-14 NOTE — Telephone Encounter (Signed)
06/14/21 @5PM : PC SW faxed patients FL2 to the following SNF's for STR placement:  [facility] admissions coordinator at [place]: [place] admissions coordinator at [facility]: [phone]

## 2021-06-14 NOTE — Telephone Encounter (Signed)
Completed and signed.Please Fax to # [phone] per [place] Perry,LCSW request.

## 2021-06-14 NOTE — Progress Notes (Signed)
06/14/21 @ 1:20 PM: PC SW outreached patients daughter, Kathleen Gallegos, to follow up.  Daughter shared that patient was discharged from [facility] last night around mid night. Daughter shared that patient had some vaginal bleeding starting Monday 06/12/21 and went to the ED twice this week as a result. Patient was discharged with Flagyl and Keflex prescriptions. Daughter shared with ED that she did not feel she could care for patient at home if patient continues to have vaginal bleeding, and requested patient be sent to a STR. Patient was discharged back to home.  Today patient has had minimal bleeding. Appetite seems to be fair-normal. Patient had a fall on Monday, Sleeping pattern has been abnormal due to ED visits. Cognition decline continues. Daughter share that she still feels overwhelmed with patients care needs, more so currently due to onset of need concern of vaginal bleeding.   SW advised daughter that PCP and SW can assist with STR placement for patient should daughter wish to proceed. Daughter in agreement with placement. PCP to complete FL2. SW will fax to SNF's.

## 2021-06-14 NOTE — Telephone Encounter (Signed)
[place], Education officer, museum with [facility] called and wanted to know if you would fill out an FL2 Form for patient to go into short term SNF.   Stated that patient has been in and out of the ER the last week and daughter is requesting a SNF placement but the facilities are requesting an FL2 Form.   Please Advise.

## 2021-06-16 ENCOUNTER — Telehealth: Payer: Self-pay

## 2021-06-16 NOTE — Telephone Encounter (Signed)
PC SW outreached the following facilities to follow up on bed availability for patient:  [facility] LVM for [facility], admissions. Awaiting return call.  [facility] - SW unable to connect, has no one answered and call continuously dropped.   [facility] - SW LVM for [facility], admissions. Awaiting return call.

## 2021-06-19 ENCOUNTER — Other Ambulatory Visit: Payer: Self-pay | Admitting: Family

## 2021-06-19 DIAGNOSIS — R002 Palpitations: Secondary | ICD-10-CM

## 2021-06-20 ENCOUNTER — Telehealth: Payer: Self-pay

## 2021-06-20 NOTE — Telephone Encounter (Signed)
PC SW received TC from [place] at [facility] extending a bed offer for patient for STR.  SW outreached patients daughter, [name], to make aware of this and ensure that she still wished to move forward with STR for patient.  Call unsuccessful x3. SW left detailed VM for daughter making aware of bed offer. Awaiting return call.

## 2021-06-22 ENCOUNTER — Other Ambulatory Visit: Payer: Self-pay

## 2021-06-22 ENCOUNTER — Other Ambulatory Visit: Payer: Medicare Other

## 2021-06-22 ENCOUNTER — Telehealth: Payer: Self-pay

## 2021-06-22 DIAGNOSIS — Z515 Encounter for palliative care: Secondary | ICD-10-CM

## 2021-06-22 NOTE — Progress Notes (Signed)
[facility] SW NOTE  PATIENT NAME: Kathleen Gallegos DOB: [birth year] MRN: [id number]  PRIMARY CARE PROVIDER: [name], NP  RESPONSIBLE PARTY:  Acct ID - Guarantor Home Phone Work Phone Relationship Acct Type  [id number] [name]613 [phone]  Self P/F     [address], [place], [place] [address]     PLAN OF CARE and INTERVENTIONS:        PC SW and RN met with patients daughter, Kathleen Gallegos. Patient was still asleep in bed during visit.   Daughter provided update on patient since previous hospital visits. Patient sleeps in a lot. Daughter continues to provide assistance with ADL's and hygiene. Daughter has not witnessed any further bleeding. Patients appetite and fluid intake is the same. Patient has had some falls since hospitalization with no  injuries sustained.   SW and daughter discussed STR placement opportunity, with bed offer from [facility]. Daughter would like to move forward with bed offer.SW [employer], admissions director, to confirm moving forward with bed offer and admission process with daughter. SW LVM awaiting return call.  SW and daughter reviewed and dicussed assistance applications (Medicaid and [place]). Daughter has received additional communication from these agencies requesting additional supporting documents. SW has outreached [facility]CD caseworker Wynell Balloon [phone] ext [phone] to discuss additional information request, SW LVM, awaiting return call.   PC team provided emotional and active listenig support to patients daughter as she continues to experience caregiver fatigue and stress secondary in response to patients disease progression.    Patients code status was discussed as well during visit. Currently patient is a FULL CODE. Daughter has voiced desire for patient to be a DNR. PC RN has outreached patients PCP to make aware and to provide DNR form for patient to have in the home.   SOCIAL HX:  Social History   Tobacco Use    Smoking status: Never   Smokeless tobacco: Former    Types: Snuff  Substance Use Topics   Alcohol use: No    CODE STATUS: FULL CODE ADVANCED DIRECTIVES: Y. Daughters share guardianship of person. MOST FORM COMPLETE:  N HOSPICE EDUCATION PROVIDED: n  PPS: Patient is [place] with all ADL's at this time. Patient is ambulatory w/o AD but is a fall risk.  Patient is A&O to self only with poor insight and judgement due to dementia.     Time spent: 1hr 15 min   [name], [place]

## 2021-06-22 NOTE — Telephone Encounter (Signed)
Discussed with [name].

## 2021-06-22 NOTE — Progress Notes (Signed)
PATIENT NAME: Kathleen Gallegos DOB: [birth year] MRN: [id number]  PRIMARY CARE PROVIDER: [name], NP  RESPONSIBLE PARTY:  Acct ID - Guarantor Home Phone Work Phone Relationship Acct Type  [id number] Kathleen Gallegos* [phone]  Self P/F     [place], [place], [place] [address]    PLAN OF CARE and INTERVENTIONS:               1.  GOALS OF CARE/ ADVANCE CARE PLANNING:  Daughter desires placement.  Discussion on code status and daughter desires DNR status.                2.  PATIENT/CAREGIVER EDUCATION:  Placement.               4. PERSONAL EMERGENCY PLAN:  Activate 911 for emergencies.               5.  DISEASE STATUS:  Joint visit completed with daughter, Kathleen Gallegos and Kathleen Gallegos, [place].   Patient sleeping in the bed during our visit.  Daughter Kathleen Gallegos provided an update on patient.  ACP:  Discussion completed on code status.  Daughter desires DNR status.  105 pm.  Phone call made to PCP office to advise of upcoming placement to [facility] and DNR status.  Requested NP advise if she would sign a DNR form or if she would be okay with PC provider signing a DNR form for patient.  Response pending.    ED Visits:  Seen in the ED on 12/5 and 12/6.  Perineal bleeding noted and family needing assistance with placement noted in the ED notes.   Patient has completed her antibiotics as of yesterday.  No further bleeding is noted by daughter.  Placement/Caregiver Fatigue:   SW reviewed multiple documents needed by the [place].  Sister Kathleen Gallegos will need to assist in completing financial documents.  Patient has been offered a bed at [facility] and SW completed discussion on placement.  Daughter Kathleen Gallegos, continues to endorse caregiver fatigue and needing assistance with providing care to patient. She desires to move forward with placement at [facility].  Weakness: Patient is requiring more assistance with activities of daily living.  Daughter is assisting with toileting as patient has become more  incontinent.  She is also assisting with dressing.  November 30- 8 am patient fell in the bedroom found by daughter. Required assistance to get up but then fell again right after getting up.   Anorexia:  Currently 119 lbs with a BMI of 19.  11 lb weight loss in the last 12 months.  Drinking supplement drinks daily.  Appetite fair.  Patient is thin and frail in appearance.    HISTORY OF PRESENT ILLNESS:  87 year old female with Dementia.  Patient is being followed by Palliative Care every 4-8 weeks and PRN.   CODE STATUS: Full ADVANCED DIRECTIVES: No MOST FORM: No PPS: 40%        Lorenza Burton, RN

## 2021-06-22 NOTE — Telephone Encounter (Signed)
If she is moving into [facility] then the medical team there will be seeing her and they can sign her DNR. To let us know if she needs anything from [place] for this transition.

## 2021-06-22 NOTE — Telephone Encounter (Signed)
Kathleen Gallegos with Palliative Care called and [facility] stating they had a home visit with the patient today. Patient was offered a bed at [facility]. The daughter Kathleen Gallegos would like to proceed with that. They are waiting to hear back from the administrator coordinator on what paper work is needed. Kathleen Gallegos would like to change the patient to a DNR and just need to know if we need to sign off on that DNR or if you are okay with their medical director signing off on it. [name] out of office. To Joelene Millin, NP.

## 2021-06-28 ENCOUNTER — Ambulatory Visit: Payer: Medicare Other | Admitting: Family

## 2021-06-29 ENCOUNTER — Telehealth (INDEPENDENT_AMBULATORY_CARE_PROVIDER_SITE_OTHER): Payer: Medicare Other | Admitting: Family

## 2021-06-29 ENCOUNTER — Encounter: Payer: Self-pay | Admitting: Family

## 2021-06-29 ENCOUNTER — Telehealth: Payer: Self-pay

## 2021-06-29 ENCOUNTER — Other Ambulatory Visit: Payer: Self-pay

## 2021-06-29 ENCOUNTER — Telehealth: Payer: Self-pay | Admitting: Family

## 2021-06-29 DIAGNOSIS — M810 Age-related osteoporosis without current pathological fracture: Secondary | ICD-10-CM | POA: Diagnosis not present

## 2021-06-29 DIAGNOSIS — I129 Hypertensive chronic kidney disease with stage 1 through stage 4 chronic kidney disease, or unspecified chronic kidney disease: Secondary | ICD-10-CM | POA: Diagnosis not present

## 2021-06-29 DIAGNOSIS — N183 Chronic kidney disease, stage 3 unspecified: Secondary | ICD-10-CM | POA: Diagnosis not present

## 2021-06-29 DIAGNOSIS — J301 Allergic rhinitis due to pollen: Secondary | ICD-10-CM

## 2021-06-29 DIAGNOSIS — F411 Generalized anxiety disorder: Secondary | ICD-10-CM | POA: Diagnosis not present

## 2021-06-29 DIAGNOSIS — M159 Polyosteoarthritis, unspecified: Secondary | ICD-10-CM | POA: Diagnosis not present

## 2021-06-29 DIAGNOSIS — R5381 Other malaise: Secondary | ICD-10-CM

## 2021-06-29 DIAGNOSIS — F0390 Unspecified dementia without behavioral disturbance: Secondary | ICD-10-CM

## 2021-06-29 NOTE — Progress Notes (Signed)
This service is provided via telemedicine  No vital signs collected/recorded due to the encounter was a telemedicine visit.   Location of patient (ex: home, work):  Home  Patient consents to a telephone visit:  Kathleen Gallegos the daughter consented.  Location of the provider (ex: office, home):  [facility] and adult medicine.   Name of any referring provider:  Alis Sawchuk, Webb Silversmith, NP  Names of all persons participating in the telemedicine service and their role in the encounter:  Kathleen Gallegos the daughter and Kathleen Gallegos  Time spent on call:  9 mins     Provider: Raheim Beutler FNP-C  Kortnee Bas, Nelda Bucks, NP  Patient Care Team: Laneisha Mino, Nelda Bucks, NP as PCP - General (Family Medicine) Whitney Muse, Kelby Fam, MD (Inactive) as Consulting Physician (Hematology and Oncology) Rutherford Guys, MD as Consulting Physician (Ophthalmology) Delice Lesch Lezlie Octave, MD as Consulting Physician (Neurology)  Extended Emergency Contact Information Primary Emergency Contact: Mikey College Address: [address]          [place], [place] [address] Johnnette Litter of [place] Phone: [phone] Mobile Phone: [phone] Relation: Daughter Secondary Emergency Contact: [place] Phone: [phone] Mobile Phone: [phone] Relation: Daughter  Code Status:  Full Code Goals of care: Advanced Directive information Advanced Directives 06/29/2021  Does Patient Have a Medical Advance Directive? No  Type of Advance Directive -  Does patient want to make changes to medical advance directive? No - Patient declined  Copy of Blanca in Chart? -  Would patient like information on creating a medical advance directive? No - Patient declined     Chief Complaint  Patient presents with   Medical Management of Chronic Issues    First call to patient attempt at 10:45a unsuccessful.  2nd attempt at 10:53 a unsuccessful. Called and discussed with the patient's daughter. My chart on computer. Just  daughter on the phone [facility]). Consented. Rcent hospital visit. No surgeries, or new allergies. Last fall was was reported already, no injury. Something coming out her vent, sweeps to prevnt slipping. HVAC to access. This has been an issue for a while.     HPI:  Pt is a 87 y.o. female seen today for an acute visit for evaluation to transfer to facility. Palliative care Vance Gather stated patient was offered a bed at [facility] and daughter would like to proceed with transfer to rehab. Daughter states since patient was seen in ED on 06/13/2021 has had generalized weakness.Has had urine incontinence and requires assistance with her ADL's.Daughter states having difficulties assisting with ADL's due to patient's cognitive impairment sometimes does not want to follow instruction.Also states patient has been cautious since she last fell so nowdays holds onto furniture or wall. Appetite not so good needs reminder at times to eat. No fever,chills or cough reported.No signs of UTI's.   Past Medical History:  Diagnosis Date   Allergic rhinitis, cause unspecified    Angioneurotic edema not elsewhere classified    Anxiety disorder    Asthma    Carotid bruit    Carpal tunnel syndrome    Chronic abdominal pain    Fecal incontinence    GERD (gastroesophageal reflux disease)    Hyperlipidemia    IBS (irritable bowel syndrome)    Insomnia    Lung nodule    Osteoarthritis    Palpitations    Pancytopenia (HCC)    Senile osteoporosis    Past Surgical History:  Procedure Laterality Date   ABDOMINAL HYSTERECTOMY  1979   fibroids  bilateral cataract surgery  2008   BIOPSY THYROID  1970    COLONOSCOPY  12/21/04   normal -redundant    EUS  08/07/07   EYE SURGERY      Allergies  Allergen Reactions   Diphenhydramine Hcl Other (See Comments)    unknown   Rivastigmine Tartrate Palpitations    Outpatient Encounter Medications as of 06/29/2021  Medication Sig   Cholecalciferol (VITAMIN D3  PO) Take by mouth every morning.   CRANBERRY PO Take 25 mg by mouth daily.   donepezil (ARICEPT) 5 MG tablet Take 1 tablet (5 mg total) by mouth at bedtime.   fexofenadine (ALLEGRA) 180 MG tablet Take 180 mg by mouth as needed.   fluticasone (FLONASE) 50 MCG/ACT nasal spray Place 1 spray into both nostrils as needed.   Melatonin 10 MG TABS Take 10 mg by mouth daily.   metoprolol succinate (TOPROL-XL) 25 MG 24 hr tablet Take 1 tablet by mouth once daily   polyethylene glycol powder (GLYCOLAX/MIRALAX) 17 GM/SCOOP powder Take 17 g by mouth daily. Hold for loose stool (Patient taking differently: Take 17 g by mouth. Hold for loose stool)   sertraline (ZOLOFT) 50 MG tablet Take 1 tablet by mouth once daily   Zoster Vaccine Adjuvanted [facility]) injection Inject 0.5 mLs into the muscle once. (Patient not taking: Reported on 06/29/2021)   [DISCONTINUED] cephALEXin (KEFLEX) 500 MG capsule Take 1 capsule (500 mg total) by mouth 4 (four) times daily.   [DISCONTINUED] metroNIDAZOLE (FLAGYL) 500 MG tablet Take 1 tablet (500 mg total) by mouth 2 (two) times daily. One po bid x 7 days   [DISCONTINUED] Nutritional Supplements (ENSURE ACTIVE HIGH PROTEIN) LIQD Take 1 Can by mouth daily.   [DISCONTINUED] Tdap (BOOSTRIX) 5-2.5-18.5 LF-MCG/0.5 injection Inject 0.5 mLs into the muscle once. (Patient not taking: Reported on 06/29/2021)   No facility-administered encounter medications on file as of 06/29/2021.    Review of Systems  Unable to perform ROS: Dementia (Additional information provided by patient's daughter Kathleen Gallegos)   Immunization History  Administered Date(s) Administered   Fluad Quad(high Dose 65+) 03/25/2019, 04/29/2020, 05/03/2021   Influenza Split 03/27/2011, 04/09/2012   Influenza Whole 03/31/2007, 03/29/2009, 04/12/2009, 03/14/2010   Influenza, High Dose Seasonal PF 04/30/2018   Influenza,inj,Quad PF,6+ Mos 03/31/2013, 04/07/2014, 03/08/2015, 02/23/2016, 06/06/2016, 06/12/2017   Moderna  Sars-Covid-2 Vaccination 09/12/2019, 10/14/2019, 05/26/2020, 12/02/2020   PPD Test 06/12/2017, 12/22/2018   Pneumococcal Conjugate-13 08/05/2014   Pneumococcal Polysaccharide-23 02/11/2006   Td 04/18/2005   Zoster, Live 05/26/2013   Pertinent  Health Maintenance Due  Topic Date Due   INFLUENZA VACCINE  Completed   DEXA SCAN  Completed   Fall Risk 02/22/2021 05/03/2021 06/12/2021 06/13/2021 06/29/2021  Falls in the past year? 0 1 - - 1  Was there an injury with Fall? 0 0 - - 0  Fall Risk Category Calculator 0 2 - - 1  Fall Risk Category Low Moderate - - Low  Patient Fall Risk Level Low fall risk Moderate fall risk Moderate fall risk Moderate fall risk Low fall risk  Patient at Risk for Falls Due to No Fall Risks History of fall(s) - - Other (Comment)  Fall risk Follow up Falls evaluation completed Falls evaluation completed;Education provided;Falls prevention discussed - - Falls evaluation completed   Functional Status Survey:    There were no vitals filed for this visit. There is no height or weight on file to calculate BMI. Physical Exam Neurological:     Mental Status: She is alert.  Psychiatric:        Mood and Affect: Mood normal.        Behavior: Behavior normal.    Labs reviewed: Recent Labs    12/15/20 1428 06/12/21 1616  NA 138 136  K 4.2 4.2  CL 105 102  CO2 25 25  GLUCOSE 88 74  BUN 17 20  CREATININE 0.91* 0.98  CALCIUM 9.1 9.0   Recent Labs    12/15/20 1428  AST 22  ALT 15  BILITOT 0.5  PROT 7.5   Recent Labs    12/15/20 1428 06/12/21 1616  WBC 4.1 6.7  NEUTROABS 1,861 4.4  HGB 10.9* 11.4*  HCT 33.1* 35.0*  MCV 96.2 102.3*  PLT 145 153   Lab Results  Component Value Date   TSH 3.36 12/15/2020   No results found for: HGBA1C Lab Results  Component Value Date   CHOL 213 (H) 12/15/2020   HDL 69 12/15/2020   LDLCALC 127 (H) 12/15/2020   TRIG 78 12/15/2020   CHOLHDL 3.1 12/15/2020    Significant Diagnostic Results in last 30 days:   No results found.  Assessment/Plan 1. Physical deconditioning Generalized weakness since recent ED visit 06/13/2021  POA would like to transfer to higher level of care at [facility]. F L 2 form completed. Daughter will inquire if TB screening is required then will order lab ? To be drawn by palliative Nurse if patient unable to come to the office   2. Dementia without behavioral disturbance ([place]) Progressive decline  - continue with supportive care  - continue on donepezil.D/c for weight loss  - Transfer to [facility] per family   3. Benign hypertension with chronic kidney disease, stage III (HCC) No home blood pressure readings for review  - continue on metoprolol   4. Primary osteoarthritis involving multiple joints Continue with OTC Tylenol as needed for pain   5. Generalized anxiety disorder Continue on sertraline   6. Allergic rhinitis due to pollen, unspecified seasonality Continue on Flonase and Fexofenadine   7. Age-related osteoporosis without current pathological fracture No recent fall or fracture  - continue on vit D   Family/ staff Communication: Reviewed plan of care with patient's daughter verbalized understanding   Labs/tests ordered: None   Next Appointment: As needed if symptoms worsen or fail to improve   I connected with  Kathleen Gallegos on 07/03/21 by a video enabled telemedicine application and verified that I am speaking with the correct person using two identifiers.   I discussed the limitations of evaluation and management by telemedicine. The patient expressed understanding and agreed to proceed. Spent 34 minutes of face to face with patient  >50% time spent counseling; reviewing medical record; tests; labs; and developing future plan of care.   [name], NP

## 2021-06-29 NOTE — Telephone Encounter (Signed)
Called Vance Gather at [phone]. [name] will call me back with fax number to send FL2 form and whether or not TB test is needed.

## 2021-06-29 NOTE — Telephone Encounter (Signed)
I connected with  Kathleen Gallegos's daughter [name] 06/29/21 by a video enabled telemedicine application and verified that I am speaking with the correct person using two identifiers.   I discussed the limitations of evaluation and management by telemedicine. The patient expressed understanding and agreed to proceed.

## 2021-06-29 NOTE — Telephone Encounter (Signed)
FL2 completed.please contact patient's palliative care Vance Gather RN to see where to fax form.Also check with Nurse if TB screening is required prior to transfer to facility. If need will have HHN draw lab at home.

## 2021-06-29 NOTE — Telephone Encounter (Signed)
[name] called back. Fax number for [facility] [phone].   Adm director did not answer, she believe TB may be required. Msg was left to confirm. Verbal order for TB test given. [name] can administer if needed.  FL2 form faxed.

## 2021-06-29 NOTE — Telephone Encounter (Signed)
Patient had an appointment this morning, Routed to [name]'s CMA that was covering this morning with [name].

## 2021-07-04 ENCOUNTER — Telehealth: Payer: Self-pay

## 2021-07-04 NOTE — Telephone Encounter (Signed)
PC SW contacted Melissa with [facility] SNF to ensure she had all needed document to move forward with bed offer for patient.   SW re-faxed new LTC FL2 and dementia primary note to [facility] to fax #[phone].  Melissa to process PASRR number for patient and outreach PC SW once all is approved.   SW made patients daughter, [name], ware.

## 2021-07-06 ENCOUNTER — Telehealth: Payer: Self-pay

## 2021-07-06 NOTE — Telephone Encounter (Signed)
PC SW outreached patients daughter, [name] to make her aware that [facility] has everything they need now to accept patient and can conduct the admission as early as tomorrow 07/07/21.   Daughter shared that she will take patient to [facility] tomorrow around 2 pm for admission.   SW answered all of daughters questions, including LOS at facility and Medicare coverage. At this time it is established that patient will short term at facility as [facility] pays days 0-20 at 100%, with days 21-100 having a possible copay. SW also advised daughter that PC involvement will be limited once patient as facility, but can resume services after DC. Daughter stated understanding.   SW outreached Melissa at [facility] SN[facility] to let her know that patient will arrive at their faciliy tomorrow around 2pm via POV with daughter, [name].

## 2021-07-08 DIAGNOSIS — E785 Hyperlipidemia, unspecified: Secondary | ICD-10-CM | POA: Diagnosis not present

## 2021-07-08 DIAGNOSIS — G4700 Insomnia, unspecified: Secondary | ICD-10-CM | POA: Diagnosis not present

## 2021-07-08 DIAGNOSIS — F03B18 Unspecified dementia, moderate, with other behavioral disturbance: Secondary | ICD-10-CM | POA: Diagnosis not present

## 2021-07-08 DIAGNOSIS — E559 Vitamin D deficiency, unspecified: Secondary | ICD-10-CM | POA: Diagnosis not present

## 2021-07-08 DIAGNOSIS — M79676 Pain in unspecified toe(s): Secondary | ICD-10-CM | POA: Diagnosis not present

## 2021-07-08 DIAGNOSIS — Z79899 Other long term (current) drug therapy: Secondary | ICD-10-CM | POA: Diagnosis not present

## 2021-07-08 DIAGNOSIS — M810 Age-related osteoporosis without current pathological fracture: Secondary | ICD-10-CM | POA: Diagnosis not present

## 2021-07-08 DIAGNOSIS — N1832 Chronic kidney disease, stage 3b: Secondary | ICD-10-CM | POA: Diagnosis not present

## 2021-07-08 DIAGNOSIS — M1990 Unspecified osteoarthritis, unspecified site: Secondary | ICD-10-CM | POA: Diagnosis not present

## 2021-07-08 DIAGNOSIS — I1310 Hypertensive heart and chronic kidney disease without heart failure, with stage 1 through stage 4 chronic kidney disease, or unspecified chronic kidney disease: Secondary | ICD-10-CM | POA: Diagnosis not present

## 2021-07-08 DIAGNOSIS — B351 Tinea unguium: Secondary | ICD-10-CM | POA: Diagnosis not present

## 2021-07-08 DIAGNOSIS — F0390 Unspecified dementia without behavioral disturbance: Secondary | ICD-10-CM | POA: Diagnosis not present

## 2021-07-08 DIAGNOSIS — E039 Hypothyroidism, unspecified: Secondary | ICD-10-CM | POA: Diagnosis not present

## 2021-07-08 DIAGNOSIS — F419 Anxiety disorder, unspecified: Secondary | ICD-10-CM | POA: Diagnosis not present

## 2021-07-08 DIAGNOSIS — F32A Depression, unspecified: Secondary | ICD-10-CM | POA: Diagnosis not present

## 2021-07-08 DIAGNOSIS — D631 Anemia in chronic kidney disease: Secondary | ICD-10-CM | POA: Diagnosis not present

## 2021-07-08 DIAGNOSIS — D649 Anemia, unspecified: Secondary | ICD-10-CM | POA: Diagnosis not present

## 2021-07-08 DIAGNOSIS — I10 Essential (primary) hypertension: Secondary | ICD-10-CM | POA: Diagnosis not present

## 2021-07-08 DIAGNOSIS — E119 Type 2 diabetes mellitus without complications: Secondary | ICD-10-CM | POA: Diagnosis not present

## 2021-07-08 DIAGNOSIS — J302 Other seasonal allergic rhinitis: Secondary | ICD-10-CM | POA: Diagnosis not present

## 2021-07-10 DIAGNOSIS — F419 Anxiety disorder, unspecified: Secondary | ICD-10-CM | POA: Diagnosis not present

## 2021-07-10 DIAGNOSIS — I10 Essential (primary) hypertension: Secondary | ICD-10-CM | POA: Diagnosis not present

## 2021-07-10 DIAGNOSIS — F32A Depression, unspecified: Secondary | ICD-10-CM | POA: Diagnosis not present

## 2021-07-10 DIAGNOSIS — M1990 Unspecified osteoarthritis, unspecified site: Secondary | ICD-10-CM | POA: Diagnosis not present

## 2021-07-11 DIAGNOSIS — E559 Vitamin D deficiency, unspecified: Secondary | ICD-10-CM | POA: Diagnosis not present

## 2021-07-11 DIAGNOSIS — F0390 Unspecified dementia without behavioral disturbance: Secondary | ICD-10-CM | POA: Diagnosis not present

## 2021-07-11 DIAGNOSIS — G4700 Insomnia, unspecified: Secondary | ICD-10-CM | POA: Diagnosis not present

## 2021-07-11 DIAGNOSIS — Z79899 Other long term (current) drug therapy: Secondary | ICD-10-CM | POA: Diagnosis not present

## 2021-07-12 DIAGNOSIS — F0390 Unspecified dementia without behavioral disturbance: Secondary | ICD-10-CM | POA: Diagnosis not present

## 2021-07-12 DIAGNOSIS — F419 Anxiety disorder, unspecified: Secondary | ICD-10-CM | POA: Diagnosis not present

## 2021-07-12 DIAGNOSIS — I10 Essential (primary) hypertension: Secondary | ICD-10-CM | POA: Diagnosis not present

## 2021-07-12 DIAGNOSIS — F32A Depression, unspecified: Secondary | ICD-10-CM | POA: Diagnosis not present

## 2021-07-13 DIAGNOSIS — F32A Depression, unspecified: Secondary | ICD-10-CM | POA: Diagnosis not present

## 2021-07-13 DIAGNOSIS — M810 Age-related osteoporosis without current pathological fracture: Secondary | ICD-10-CM | POA: Diagnosis not present

## 2021-07-13 DIAGNOSIS — D631 Anemia in chronic kidney disease: Secondary | ICD-10-CM | POA: Diagnosis not present

## 2021-07-13 DIAGNOSIS — N1832 Chronic kidney disease, stage 3b: Secondary | ICD-10-CM | POA: Diagnosis not present

## 2021-07-20 ENCOUNTER — Telehealth: Payer: Self-pay

## 2021-07-20 DIAGNOSIS — E785 Hyperlipidemia, unspecified: Secondary | ICD-10-CM | POA: Diagnosis not present

## 2021-07-20 DIAGNOSIS — D631 Anemia in chronic kidney disease: Secondary | ICD-10-CM | POA: Diagnosis not present

## 2021-07-20 DIAGNOSIS — N1832 Chronic kidney disease, stage 3b: Secondary | ICD-10-CM | POA: Diagnosis not present

## 2021-07-20 NOTE — Telephone Encounter (Signed)
PC SW outreached patients daughter [name], to follow up on patiens transition to [facility].   Call unsuccessful. SW LVM.

## 2021-07-21 ENCOUNTER — Ambulatory Visit: Payer: Medicare Other

## 2021-07-21 DIAGNOSIS — Z515 Encounter for palliative care: Secondary | ICD-10-CM

## 2021-07-21 NOTE — Progress Notes (Signed)
COMMUNITY PALLIATIVE CARE SW NOTE  PATIENT NAME: ANITRA DOXTATER DOB: January 10, 1926 MRN: [id number]  PRIMARY CARE PROVIDER: [name], NP  RESPONSIBLE PARTY:  Acct ID - Guarantor Home Phone Work Phone Relationship Acct Type  [id number] DUDLEY, COOLEY* [phone]  Self P/F     [place], [place], [place] [address]   PC SW outreached patients daughter [name], via telephone.  Daughter shared that patient admitted to [facility] SNF on 07/08/21. Daughter has had a careplan meeting the team at SNF and is aware of her POC's to outreach. Daughter shared that patient seems to be doing well at SNF and she recently took patient to a pre-scheduled dental appointment last week where patient had a tooth extraction that went well. Patient shared that SNF has moved patient to their [facility] hall as of [facility] 07/17/21.  Daughter express some adjustment issues with patient not being in the home with her at this time an d that she has limited herself on how often she goes to visit patient due to this.   Daughter stated that she knows patient is I the best place and would like for patient to transition to an ALF once rehab is complete. SW encouraged daughter to share this DC disposition with IDT team at [facility] so that can assist in working towards this DC plan. Daughter shared thnat she will make them aware. Daughter is aware that at this time PC will no longer be able to follow patient as [facility] does not have a contract to serve patients in [facility]. Daughter will outreach SW once patient DC's.   SOCIAL HX:  Social History   Tobacco Use   Smoking status: Never   Smokeless tobacco: Former    Types: Snuff  Substance Use Topics   Alcohol use: No    CODE STATUS: DNR ADVANCED DIRECTIVES: Y MOST FORM COMPLETE:  Y HOSPICE EDUCATION PROVIDED: N   Time spent 40 min     [place], [place]

## 2021-07-24 ENCOUNTER — Other Ambulatory Visit: Payer: Self-pay

## 2021-08-09 DIAGNOSIS — F32A Depression, unspecified: Secondary | ICD-10-CM | POA: Diagnosis not present

## 2021-08-09 DIAGNOSIS — I10 Essential (primary) hypertension: Secondary | ICD-10-CM | POA: Diagnosis not present

## 2021-08-09 DIAGNOSIS — F419 Anxiety disorder, unspecified: Secondary | ICD-10-CM | POA: Diagnosis not present

## 2021-08-09 DIAGNOSIS — F0390 Unspecified dementia without behavioral disturbance: Secondary | ICD-10-CM | POA: Diagnosis not present

## 2021-08-11 ENCOUNTER — Telehealth: Payer: Self-pay

## 2021-08-11 NOTE — Telephone Encounter (Signed)
Patient's daughter [name]) called and stated patient is at [facility] and [facility] since 07/08/21 and they did labs on patient. Daughter want [name] Ngetich,NP input on if patient should be on any medication for her cholesterol she states that the doctor that the facility had patient on Lipitor 10 mg and they advised them to stop the medication due to side effects of the medication.

## 2021-08-11 NOTE — Telephone Encounter (Signed)
I recommend discussing cholesterol medication risk verse benefits with the facility provider while she is at the facility. If cholesterol level is high I would recommend treating unless patient has any side effects from the medication.

## 2021-08-15 DIAGNOSIS — E785 Hyperlipidemia, unspecified: Secondary | ICD-10-CM | POA: Diagnosis not present

## 2021-08-15 DIAGNOSIS — Z79899 Other long term (current) drug therapy: Secondary | ICD-10-CM | POA: Diagnosis not present

## 2021-08-15 NOTE — Telephone Encounter (Signed)
Called daughter and no answer, left message to return call.

## 2021-08-16 NOTE — Telephone Encounter (Signed)
Patient's daughter [name]) return call and was advised. She reports that she will have doctor at [facility] and [facility] to restart cholesterol medication on patient.

## 2021-08-17 ENCOUNTER — Telehealth: Payer: Self-pay | Admitting: *Deleted

## 2021-08-17 NOTE — Telephone Encounter (Signed)
I agree.while patient is in rehab need to follow up with facility Nurse or provider.

## 2021-08-17 NOTE — Telephone Encounter (Signed)
Patient Daughter, [name] called and left message on clinical intake and stated that patient has been in [facility] since the 31st.  Stated that she brought patient home on Tuesday and took her back yesterday and stated that she notices something different in patient's walking.  Stated that patient's right leg almost gave out on her while getting her out of the car at the facility.  Daughter is wondering if the rehab is pushing patient too hard.    Tried calling daughter back, LMOM to return call.

## 2021-08-17 NOTE — Telephone Encounter (Signed)
Patient daughter called back. I instructed her to speak with the nurses at the facility to have them have the Provider there to take a look at patient. Instructed daughter to express her concerns to the facility. Daughter agreed and has placed a call to the facility nurse.   FYI

## 2021-08-25 ENCOUNTER — Ambulatory Visit: Payer: Medicare Other | Admitting: Family

## 2021-08-28 DIAGNOSIS — Z515 Encounter for palliative care: Secondary | ICD-10-CM

## 2021-08-28 NOTE — Progress Notes (Signed)
COMMUNITY PALLIATIVE CARE SW NOTE  PATIENT NAME: GILA LAUF DOB: Nov 11, 1925 MRN: [id number]  PRIMARY CARE PROVIDER: Ngetich, Nelda Bucks, NP  RESPONSIBLE PARTY: There is no guarantor information entered for this encounter.   PC SW spoke with patients daughter, [name], who provided a update on patients  status at [facility].  Daughter shared that SNF has ended PT for patient and has dicussed an anticipated DC date of this Fri 09/01/21. Daughter shared that she and her siblings have dicussed patient transitioning to an ALF, specifically [facility]. Daughter shared that she has not confirmed patients DC with SNF nor has she shared with SNF that she feels that she can no longer care for patient at home.  SW encouraged daughter to discuss her DC desires for patient with SNF so that they can assist family in planning accordingly.  Daughter shared that she and family feel it would be best for patient to transition to an ALF instead of Dcing back to home. Daughter states that she and family anticipate to meet with SNF IDT to discuss patients DC plan in more detail.   Daughter to update SW on patients DC disposition after meeting with SNF IDT.      SOCIAL HX:  Social History   Tobacco Use   Smoking status: Never   Smokeless tobacco: Former    Types: Snuff  Substance Use Topics   Alcohol use: No         Varonica Siharath [place], [place]

## 2021-08-29 ENCOUNTER — Other Ambulatory Visit: Payer: Self-pay | Admitting: Family

## 2021-08-29 DIAGNOSIS — F419 Anxiety disorder, unspecified: Secondary | ICD-10-CM | POA: Diagnosis not present

## 2021-08-29 DIAGNOSIS — I10 Essential (primary) hypertension: Secondary | ICD-10-CM | POA: Diagnosis not present

## 2021-08-29 DIAGNOSIS — F03918 Unspecified dementia, unspecified severity, with other behavioral disturbance: Secondary | ICD-10-CM

## 2021-08-29 DIAGNOSIS — M1990 Unspecified osteoarthritis, unspecified site: Secondary | ICD-10-CM | POA: Diagnosis not present

## 2021-08-29 DIAGNOSIS — Z515 Encounter for palliative care: Secondary | ICD-10-CM

## 2021-08-29 DIAGNOSIS — F411 Generalized anxiety disorder: Secondary | ICD-10-CM

## 2021-08-29 DIAGNOSIS — F32A Depression, unspecified: Secondary | ICD-10-CM | POA: Diagnosis not present

## 2021-08-29 NOTE — Progress Notes (Signed)
COMMUNITY PALLIATIVE CARE SW NOTE  PATIENT NAME: Kathleen Gallegos DOB: 1925-07-12 MRN: [id number]  PRIMARY CARE PROVIDER: Ngetich, Nelda Bucks, NP  RESPONSIBLE PARTY: There is no guarantor information entered for this encounter.   PC SW spoke with patients daughter, [name].   Daughter shared that family has decided to DC patient on Sat 09/02/21.  Daughter shared that facility SW has sent an FL2 to [facility] and [facility].   SW and daughter discussed in detail what to anticipate for the DC process.  SW emailed daughter and her siblings a new Medicaid application as well, due to assist with the ALF placement process.   SW made daughter aware that PC will schedule a PC visit once patient has been discharged.      SOCIAL HX:  Social History   Tobacco Use   Smoking status: Never   Smokeless tobacco: Former    Types: Snuff  Substance Use Topics   Alcohol use: No          Holten Spano [place], [place]

## 2021-08-31 DIAGNOSIS — M79676 Pain in unspecified toe(s): Secondary | ICD-10-CM | POA: Diagnosis not present

## 2021-08-31 DIAGNOSIS — B351 Tinea unguium: Secondary | ICD-10-CM | POA: Diagnosis not present

## 2021-09-01 ENCOUNTER — Telehealth: Payer: Self-pay | Admitting: *Deleted

## 2021-09-01 NOTE — Telephone Encounter (Signed)
Kathleen Gallegos with [facility] called and left message on Clinical Intake wanting to know if patient was an active patient with our practice.    Called and left message on voicemail to return call.    (It looks like patient is being discharged from [facility] into [facility]. Patient has a follow up appointment with [name] on 09/07/2021)

## 2021-09-05 ENCOUNTER — Telehealth: Payer: Self-pay

## 2021-09-05 DIAGNOSIS — Z515 Encounter for palliative care: Secondary | ICD-10-CM

## 2021-09-05 NOTE — Progress Notes (Signed)
PC SW received incoming TC from patients daughter, Kathleen Gallegos.  Daughter shared that patient was discharged [facility] on sat 09/02/2021. Patient does not have [facility] services in place yet. Patient has new elevated toilet seat and still awaiting extended shower bench.  Daughter shared that patient is at baseline, possibly a bit worse, since being discharged from SNF. Daughter that patient is sleeping more now and she continues to provide assistance with ADL's (toileting and hygiene).  Daughter share that she and family continue to discuss patients long term plan and are apprehensive about moving forward with completing another medicaid application due to patients assets (home and land) SW encouraged daughter and family to speak with a lawyer to discuss their options. Daughter states that she and family know that they have to make decision soon due to daughter not being able to care for patient 24/7 alone for much longer.   Daughter shared that she has noticed a small area on patients backside above the rectum, that she will inquire about at patients upcoming PCP on Thu 09/07/21.  SW scheduled in home PC visit for 09/19/21 @1130 

## 2021-09-05 NOTE — Telephone Encounter (Signed)
PC SW outreached patient daughter, [name], returning her TC.  Call unsuccessful. SW LVM with contact info.

## 2021-09-07 ENCOUNTER — Telehealth: Payer: Self-pay | Admitting: *Deleted

## 2021-09-07 ENCOUNTER — Ambulatory Visit: Payer: Medicare Other | Admitting: Family

## 2021-09-07 NOTE — Telephone Encounter (Signed)
Patient daughter, [name] called and stated that patient was discharged from [facility] Saturday. Patient is not cooperating and does not want to get up out of bed and due to this had to cancel patient's appointment for today.  ? ?Daughter stated that patient has not eaten nor taken any of her medications today and unable to get her up to go to the bathroom. Daughter stated that she "could be lying in her pee and poop" because she is unable to get her up. Using Depends.Understands that she cannot lay there all day. Daughter is afraid of the Frailty of her body and worried about her weight. ?Daughter stated that patient has a sore on her backside, near rectum area,  that she thinks needs to be looked at and wonders if a Hospital stay would get her an evaluation.  ? ?Daughter stated that she is not able to deal with this due to her IBS ?[facility] [place] has scheduled an appointment to come 09/15/2021. Patient also has Palliative Care.   ? ? ?Scheduled MyChart Visit with [facility] for 09/08/2021 to discuss Concerns.  ?FYI for appointment.  ? ?

## 2021-09-07 NOTE — Telephone Encounter (Signed)
LMOM to return call.

## 2021-09-07 NOTE — Telephone Encounter (Signed)
If patient is not eating and unable to get out bed recommend ED evaluation.   ?

## 2021-09-08 ENCOUNTER — Other Ambulatory Visit: Payer: Self-pay

## 2021-09-08 ENCOUNTER — Telehealth (INDEPENDENT_AMBULATORY_CARE_PROVIDER_SITE_OTHER): Payer: Medicare Other | Admitting: Family

## 2021-09-08 ENCOUNTER — Encounter: Payer: Self-pay | Admitting: Family

## 2021-09-08 DIAGNOSIS — M159 Polyosteoarthritis, unspecified: Secondary | ICD-10-CM | POA: Diagnosis not present

## 2021-09-08 DIAGNOSIS — E785 Hyperlipidemia, unspecified: Secondary | ICD-10-CM

## 2021-09-08 DIAGNOSIS — F0390 Unspecified dementia without behavioral disturbance: Secondary | ICD-10-CM | POA: Diagnosis not present

## 2021-09-08 DIAGNOSIS — L89150 Pressure ulcer of sacral region, unstageable: Secondary | ICD-10-CM | POA: Diagnosis not present

## 2021-09-08 DIAGNOSIS — I129 Hypertensive chronic kidney disease with stage 1 through stage 4 chronic kidney disease, or unspecified chronic kidney disease: Secondary | ICD-10-CM

## 2021-09-08 DIAGNOSIS — N183 Chronic kidney disease, stage 3 unspecified: Secondary | ICD-10-CM | POA: Diagnosis not present

## 2021-09-08 NOTE — Progress Notes (Signed)
This service is provided via telemedicine  No vital signs collected/recorded due to the encounter was a telemedicine visit.   Location of patient (ex: home, work):  Home  Patient consents to a telephone visit:  Yes  Location of the provider (ex: office, home):  [employer].  Name of any referring provider:  Caz Weaver, Nelda Bucks, NP   Names of all persons participating in the telemedicine service and their role in the encounter:  Patient, Heriberto Antigua, [place], [place], Webb Silversmith, NP.    Time spent on call:  8 minutes spent on the phone with Medical Assistant.   Provider: Marlowe Sax FNP-C  Jymir Dunaj, Nelda Bucks, NP  Patient Care Team: Marianna Cid, Nelda Bucks, NP as PCP - General (Family Medicine) Whitney Muse, Kelby Fam, MD (Inactive) as Consulting Physician (Hematology and Oncology) Rutherford Guys, MD as Consulting Physician (Ophthalmology) Delice Lesch Lezlie Octave, MD as Consulting Physician (Neurology)  Extended Emergency Contact Information Primary Emergency Contact: Mikey College Address: [address]          [place], [place] [address] Johnnette Litter of [place] Phone: [phone] Mobile Phone: [phone] Relation: Daughter Secondary Emergency Contact: Jansen Phone: [phone] Mobile Phone: [phone] Relation: Daughter  Code Status:  [place] e Goals of care: Advanced Directive information Advanced Directives 09/08/2021  Does Patient Have a Medical Advance Directive? No  Type of Advance Directive -  Does patient want to make changes to medical advance directive? -  Copy of [place] in Chart? -  Would patient like information on creating a medical advance directive? No - Patient declined     Chief Complaint  Patient presents with   Acute Visit    Patient daughter "Semone Orlov" complains of patient having sore in buttocks area. She also states that patient will not get out of bed, and having more confusion.     HPI:  Pt is a  88 y.o. female seen today for an acute visit for evaluation of sore on her sacral area.HPI provided by daughter [name].states patient has a sore on the left -sacral area pinto bean size sore area,patient is incontinent.states area is sensitivity  but not open.No drainage. Patient has not been able to get out of bed since she was discharge from [facility] but was up today.States feeling much better today.daughter thinks patient has been tired since coming home fro SNF.Daughter thinks she overreacted when she saw patient sleeping most of the time.Tends to be bending more or having soreness.  States eat well today. No signs of URI.  Has Home health Therapy 09/15/2021 and palliative Care.     Past Medical History:  Diagnosis Date   Allergic rhinitis, cause unspecified    Angioneurotic edema not elsewhere classified    Anxiety disorder    Asthma    Carotid bruit    Carpal tunnel syndrome    Chronic abdominal pain    Fecal incontinence    GERD (gastroesophageal reflux disease)    Hyperlipidemia    IBS (irritable bowel syndrome)    Insomnia    Lung nodule    Osteoarthritis    Palpitations    Pancytopenia ([place])    Senile osteoporosis    Past Surgical History:  Procedure Laterality Date   ABDOMINAL HYSTERECTOMY  1979   fibroids    bilateral cataract surgery  2008   BIOPSY THYROID  1970    COLONOSCOPY  12/21/04   normal -redundant    EUS  08/07/07   EYE SURGERY  Allergies  Allergen Reactions   Diphenhydramine Hcl Other (See Comments)    unknown   Rivastigmine Tartrate Palpitations    Outpatient Encounter Medications as of 09/08/2021  Medication Sig   atorvastatin (LIPITOR) 10 MG tablet Take 10 mg by mouth daily.   Cholecalciferol (VITAMIN D3 PO) Take by mouth every morning.   CRANBERRY PO Take 25 mg by mouth daily.   donepezil (ARICEPT) 5 MG tablet Take 1 tablet (5 mg total) by mouth at bedtime.   fexofenadine (ALLEGRA) 180 MG tablet Take 180 mg by mouth as needed.    fluticasone (FLONASE) 50 MCG/ACT nasal spray Place 1 spray into both nostrils as needed.   Melatonin 10 MG TABS Take 10 mg by mouth daily.   metoprolol succinate (TOPROL-XL) 25 MG 24 hr tablet Take 1 tablet by mouth once daily   polyethylene glycol powder (GLYCOLAX/MIRALAX) 17 GM/SCOOP powder Take 17 g by mouth daily. Hold for loose stool   sertraline (ZOLOFT) 50 MG tablet Take 1 tablet by mouth once daily   [DISCONTINUED] Zoster Vaccine Adjuvanted [facility]) injection Inject 0.5 mLs into the muscle once. (Patient not taking: Reported on 06/29/2021)   No facility-administered encounter medications on file as of 09/08/2021.    Review of Systems  Unable to perform ROS: Dementia (Information provided by daughter [name])  Constitutional:  Negative for appetite change, chills, fatigue and fever.  HENT:  Negative for congestion, rhinorrhea, sinus pressure, sinus pain, sneezing and sore throat.   Respiratory:  Negative for cough, chest tightness, shortness of breath and wheezing.   Cardiovascular:  Negative for chest pain, palpitations and leg swelling.  Gastrointestinal:  Negative for abdominal distention, abdominal pain, constipation, diarrhea, nausea and vomiting.  Genitourinary:        Incontinent   Musculoskeletal:  Positive for arthralgias and gait problem.  Skin:  Negative for color change, pallor and rash.   Immunization History  Administered Date(s) Administered   Fluad Quad(high Dose 65+) 03/25/2019, 04/29/2020, 05/03/2021   Influenza Split 03/27/2011, 04/09/2012   Influenza Whole 03/31/2007, 03/29/2009, 04/12/2009, 03/14/2010   Influenza, High Dose Seasonal PF 04/30/2018   Influenza,inj,Quad PF,6+ Mos 03/31/2013, 04/07/2014, 03/08/2015, 02/23/2016, 06/06/2016, 06/12/2017   Moderna Sars-Covid-2 Vaccination 09/12/2019, 10/14/2019, 05/26/2020, 12/02/2020   PPD Test 06/12/2017, 12/22/2018   Pneumococcal Conjugate-13 08/05/2014   Pneumococcal Polysaccharide-23 02/11/2006   Td  04/18/2005   Zoster, Live 05/26/2013   Pertinent  Health Maintenance Due  Topic Date Due   INFLUENZA VACCINE  Completed   DEXA SCAN  Completed   Fall Risk 05/03/2021 06/12/2021 06/13/2021 06/29/2021 09/08/2021  Falls in the past year? 1 - - 1 0  Was there an injury with Fall? 0 - - 0 0  Fall Risk Category Calculator 2 - - 1 0  Fall Risk Category Moderate - - Low Low  Patient Fall Risk Level Moderate fall risk Moderate fall risk Moderate fall risk Low fall risk Low fall risk  Patient at Risk for Falls Due to History of fall(s) - - Other (Comment) No Fall Risks  Fall risk Follow up Falls evaluation completed;Education provided;Falls prevention discussed - - Falls evaluation completed Falls evaluation completed   Functional Status Survey:    There were no vitals filed for this visit. There is no height or weight on file to calculate BMI. Physical Exam Unable to evaluate on the phone   Labs reviewed: Recent Labs    12/15/20 1428 06/12/21 1616  NA 138 136  K 4.2 4.2  CL 105 102  CO2 25  25  GLUCOSE 88 74  BUN 17 20  CREATININE 0.91* 0.98  CALCIUM 9.1 9.0   Recent Labs    12/15/20 1428  AST 22  ALT 15  BILITOT 0.5  PROT 7.5   Recent Labs    12/15/20 1428 06/12/21 1616  WBC 4.1 6.7  NEUTROABS 1,861 4.4  HGB 10.9* 11.4*  HCT 33.1* 35.0*  MCV 96.2 102.3*  PLT 145 153   Lab Results  Component Value Date   TSH 3.36 12/15/2020   No results found for: HGBA1C Lab Results  Component Value Date   CHOL 213 (H) 12/15/2020   HDL 69 12/15/2020   LDLCALC 127 (H) 12/15/2020   TRIG 78 12/15/2020   CHOLHDL 3.1 12/15/2020    Significant Diagnostic Results in last 30 days:  No results found.  Assessment/Plan 1. Benign hypertension with chronic kidney disease, stage III ([place]) No home readings for evaluation. Asymptomatic  2. Dementia without behavioral disturbance ([place]) Progressive decline expected. Continue with supportive care  Continue on Aricept  - continue on  Palliative Care recommend Hospice if deconditioning   3. Primary osteoarthritis involving multiple joints Advised to daughter to give OTC tylenol as needed  4. Hyperlipidemia LDL goal <100 Off atorvastatin while in SNF   5. Pressure ulcer of sacral region, unstageable [facility]) Daughter reports Binto bean size area on left sacral not open and without any drainage. - advised to cleanse area,pat dry and apply Zinc oxide or Desitin ointment daily and as needed  - continue on Protein supplement  - Palliative Nurse to follow up   Family/ staff Communication: Reviewed plan of care with patient and daughter verbalized understanding   Labs/tests ordered: Advised to follow up for labs orders in place   Next Appointment: 6 months for medical management of chronic issues.  I connected with  Marti Mclane Klemens on 09/08/21 by a Telephone enabled telemedicine application and verified that I am speaking with the correct person using two identifiers.   I discussed the limitations of evaluation and management by telemedicine. The patient expressed understanding and agreed to proceed.  Spent 24 minutes of non-face to face with patient  >50% time spent counseling; reviewing medical record; tests; labs; and developing future plan of care.   [name], NP

## 2021-09-11 NOTE — Telephone Encounter (Signed)
Appointment 09/08/2021. ? ?

## 2021-09-13 ENCOUNTER — Telehealth: Payer: Self-pay | Admitting: Nurse Practitioner

## 2021-09-13 NOTE — Telephone Encounter (Signed)
Attempted to contact daughter Cynrhia at her home and cell number to schedule Palliative Consult, no answer - left VM at both numbers requesting a return call to schedule visit. ?

## 2021-09-15 ENCOUNTER — Telehealth: Payer: Self-pay | Admitting: Nurse Practitioner

## 2021-09-15 ENCOUNTER — Telehealth: Payer: Self-pay

## 2021-09-15 NOTE — Telephone Encounter (Signed)
Returned call to patient's daughter [name] and we discussed the Palliative referral/services and all questions were answered and she was in agreement with scheduling visit.  I have scheduled a [place] for 09/26/21 @ 1:30 PM ?

## 2021-09-15 NOTE — Telephone Encounter (Signed)
Noted  

## 2021-09-15 NOTE — Telephone Encounter (Signed)
Returned call to patient's daughter [name], regarding scheduling a visit with our Palliative NP, no answer - left message on home and cell number requesting a return call to schedule visit. ?

## 2021-09-15 NOTE — Telephone Encounter (Signed)
Incoming call received from [facility], [place] with [place] requesting verbal for nursing for  ? ?1 week 1 ?2 week 1 ?1 week 3 ?3 month 1 ?And 2 times as needed  ? ?Per [facility] standing order, verbal order given to provide nursing as listed above. Message will be sent to patient's provider as a FYI.  ? ?

## 2021-09-19 ENCOUNTER — Other Ambulatory Visit: Payer: Self-pay

## 2021-09-19 ENCOUNTER — Other Ambulatory Visit: Payer: Medicare Other

## 2021-09-19 DIAGNOSIS — Z515 Encounter for palliative care: Secondary | ICD-10-CM

## 2021-09-19 NOTE — Progress Notes (Signed)
PATIENT NAME: Kathleen Gallegos ?DOB: 1926-05-19 ?MRN: [id number] ? ?PRIMARY CARE PROVIDER: Nget[place], Nelda Bucks, NP ? ?RESPONSIBLE PARTY:  ?Acct ID - Guarantor Home Phone Work Phone Relationship Acct Type  ?[id number] [name]* [phone]  Self P/F  ?   [place], [place], [place] [address]  ? ? ?Due to the COVID-19 crisis, this visit was done via telemedicine from my office and it was initiated and consent by this patient and or family. ? ?I connected with  Bettyjo Lundblad Segovia OR PROX[place] on 09/19/21 by telephone and verified that I am speaking with the correct person using two identifiers. ?  ?I discussed the limitations of evaluation and management by telemedicine. The patient expressed understanding and agreed to proceed.  ? ?Telephone call made to [name] to re-schedule home visit to next Friday at 91 am.   [name] is agree able with date change.  Patient's other daughter [name]) and son-in-law will be present for visit.  There is still discussion about placement for patient.  Currently [place] is seeing patient.  [name] endorses patient is more frail since returning home from [facility].  No falls have occurred since discharge home.   Patient is gradually gaining strength and now getting up on her own.  She does require extensive assistance with ADL's.   Patient is scheduled to see her PCP tomorrow for fasting blood work.  [name] will obtain a weight while in the office.  No other concerns voiced at this time and visit is scheduled for next week.  ? ? ? ? ?Lorenza Burton, RN ? ?

## 2021-09-20 ENCOUNTER — Other Ambulatory Visit: Payer: Medicare Other

## 2021-09-20 DIAGNOSIS — E785 Hyperlipidemia, unspecified: Secondary | ICD-10-CM

## 2021-09-20 DIAGNOSIS — I129 Hypertensive chronic kidney disease with stage 1 through stage 4 chronic kidney disease, or unspecified chronic kidney disease: Secondary | ICD-10-CM

## 2021-09-20 DIAGNOSIS — E559 Vitamin D deficiency, unspecified: Secondary | ICD-10-CM

## 2021-09-21 ENCOUNTER — Encounter: Payer: Self-pay | Admitting: Family

## 2021-09-21 ENCOUNTER — Ambulatory Visit (INDEPENDENT_AMBULATORY_CARE_PROVIDER_SITE_OTHER): Payer: Medicare Other | Admitting: Family

## 2021-09-21 ENCOUNTER — Other Ambulatory Visit: Payer: Self-pay

## 2021-09-21 VITALS — BP 112/70 | HR 82 | Temp 97.3°F | Ht 66.0 in | Wt 113.0 lb

## 2021-09-21 DIAGNOSIS — R638 Other symptoms and signs concerning food and fluid intake: Secondary | ICD-10-CM | POA: Diagnosis not present

## 2021-09-21 DIAGNOSIS — R634 Abnormal weight loss: Secondary | ICD-10-CM

## 2021-09-21 DIAGNOSIS — R5381 Other malaise: Secondary | ICD-10-CM

## 2021-09-21 DIAGNOSIS — F0390 Unspecified dementia without behavioral disturbance: Secondary | ICD-10-CM | POA: Diagnosis not present

## 2021-09-21 NOTE — Progress Notes (Signed)
? ?Provider: Marlowe Sax FNP-C ? ?Shalon Salado, Nelda Bucks, NP ? ?Patient Care Team: ?[place], Nelda Bucks, NP as PCP - General (Family Medicine) ?[place], Kelby Fam, MD (Inactive) as Consulting Physician (Hematology and Oncology) ?Rutherford Guys, MD as Consulting Physician (Ophthalmology) ?Cameron Sprang, MD as Consulting Physician (Neurology) ? ?Extended Emergency Contact Information ?Primary Emergency Contact: [place]wood,[name] W ?Address: [address] ?         [place], [place] [address] [place] ?Home Phone: [phone] ?Mobile Phone: [phone] ?Relation: Daughter ?Secondary Emergency Contact: Thompson,Norma ?Home Phone: [phone] ?Mobile Phone: [phone] ?Relation: Daughter ? ?Code Status:  Full Code  ?Goals of care: Advanced Directive information ?Advanced Directives 09/21/2021  ?Does Patient Have a Medical Advance Directive? Yes  ?Type of Advance Directive Healthcare Power of Attorney  ?Does patient want to make changes to medical advance directive? No - Patient declined  ?Copy of [place] in Chart? Yes - validated most recent copy scanned in chart (See row information)  ?Would patient like information on creating a medical advance directive? -  ? ? ? ?Chief Complaint  ?Patient presents with  ? Acute Visit  ?  Weakness and decline in health. Moderate fall risk. Generalized pain. Here with daughter and 2 other assistants. Appetite is ok per patient. Fasting if labs due.   ? ? ?HPI:  ?Pt is a 88 y.o. female seen today for an acute visit for evaluation of generalized weakness and decline in condition. She is here with her 2 daughters and son-in-law.Her primary caregiver [name] states patient has been staying more in the bed and has had decreased oral intake.Seems to be in pain whenever she tries to get up.Daughter states patient not walking as much since she was discharged from rehab. Worked with physical therapy but no improvement.  Has new onset of urine incontinence and  bowel.Once in the leg she is able to make it to the bathroom.She requires total care with activities of daily living.Daughter states unable to provide care request for an assistant aide to help. ?No fever or chills or other signs of upper respiratory infection.  No signs of urinary tract and reported.Family's concern of patient's declining condition.  Currently on palliative care requests referral to hospice services due to her advanced age and declining condition. ?Has had 6 pounds weight loss since last seen. ? ?Recent pressure ulcer has healed but has small scab remaining.  She remains high risk for pressure ulcers due to decreased oral intake. ? ? ?Past Medical History:  ?Diagnosis Date  ? Allergic rhinitis, cause unspecified   ? Angioneurotic edema not elsewhere classified   ? Anxiety disorder   ? Asthma   ? Carotid bruit   ? Carpal tunnel syndrome   ? Chronic abdominal pain   ? Fecal incontinence   ? GERD (gastroesophageal reflux disease)   ? Hyperlipidemia   ? IBS (irritable bowel syndrome)   ? Insomnia   ? Lung nodule   ? Osteoarthritis   ? Palpitations   ? Pancytopenia ([place])   ? Senile osteoporosis   ? ?Past Surgical History:  ?Procedure Laterality Date  ? ABDOMINAL HYSTERECTOMY  1979  ? fibroids   ? bilateral cataract surgery  2008  ? BIOPSY THYROID  1970   ? COLONOSCOPY  12/21/04  ? normal -redundant   ? EUS  08/07/07  ? EYE SURGERY    ? ? ?Allergies  ?Allergen Reactions  ? Diphenhydramine Hcl Other (See Comments)  ?  unknown  ? Rivastigmine Tartrate  Palpitations  ? ? ?Outpatient Encounter Medications as of 09/21/2021  ?Medication Sig  ? Cholecalciferol (VITAMIN D3 PO) Take by mouth every morning.  ? CRANBERRY PO Take 25 mg by mouth daily.  ? donepezil (ARICEPT) 5 MG tablet Take 1 tablet (5 mg total) by mouth at bedtime.  ? Melatonin 10 MG TABS Take 10 mg by mouth daily.  ? metoprolol succinate (TOPROL-XL) 25 MG 24 hr tablet Take 1 tablet by mouth once daily  ? polyethylene glycol powder (GLYCOLAX/MIRALAX)  17 GM/SCOOP powder Take 17 g by mouth daily. Hold for loose stool  ? sertraline (ZOLOFT) 50 MG tablet Take 1 tablet by mouth once daily  ? fexofenadine (ALLEGRA) 180 MG tablet Take 180 mg by mouth as needed. (Patient not taking: Reported on 09/21/2021)  ? fluticasone (FLONASE) 50 MCG/ACT nasal spray Place 1 spray into both nostrils as needed. (Patient not taking: Reported on 09/21/2021)  ? [DISCONTINUED] atorvastatin (LIPITOR) 10 MG tablet Take 10 mg by mouth daily. (Patient not taking: Reported on 09/21/2021)  ? ?No facility-administered encounter medications on file as of 09/21/2021.  ? ? ?Review of Systems  ?Unable to perform ROS: Dementia (Additional information provided by patient's daughter)  ?Constitutional:  Positive for appetite change and unexpected weight change. Negative for chills, fatigue and fever.  ?HENT:  Negative for congestion, dental problem, ear discharge, ear pain, facial swelling, hearing loss, nosebleeds, postnasal drip, rhinorrhea, sinus pressure, sinus pain, sneezing, sore throat, tinnitus and trouble swallowing.   ?Eyes:  Negative for pain, discharge, redness, itching and visual disturbance.  ?Respiratory:  Negative for cough, chest tightness, shortness of breath and wheezing.   ?Cardiovascular:  Negative for chest pain, palpitations and leg swelling.  ?Gastrointestinal:  Negative for abdominal distention, abdominal pain, blood in stool, constipation, diarrhea, nausea and vomiting.  ?Endocrine: Negative for cold intolerance, heat intolerance, polydipsia, polyphagia and polyuria.  ?Genitourinary:  Negative for difficulty urinating, dysuria, flank pain, frequency and urgency.  ?Musculoskeletal:  Positive for arthralgias and gait problem. Negative for back pain, joint swelling, myalgias, neck pain and neck stiffness.  ?Skin:  Negative for color change, pallor and rash.  ?     Pressure ulcer has healed  ?Neurological:  Positive for weakness. Negative for dizziness, syncope, speech difficulty,  light-headedness, numbness and headaches.  ?     Generalized weakness  ?Hematological:  Does not bruise/bleed easily.  ?Psychiatric/Behavioral:  Positive for confusion. Negative for agitation, behavioral problems, hallucinations, self-injury, sleep disturbance and suicidal ideas. The patient is not nervous/anxious.   ? ?Immunization History  ?Administered Date(s) Administered  ? Fluad Quad(high Dose 65+) 03/25/2019, 04/29/2020, 05/03/2021  ? Influenza Split 03/27/2011, 04/09/2012  ? Influenza Whole 03/31/2007, 03/29/2009, 04/12/2009, 03/14/2010  ? Influenza, High Dose Seasonal PF 04/30/2018  ? Influenza,inj,Quad PF,6+ Mos 03/31/2013, 04/07/2014, 03/08/2015, 02/23/2016, 06/06/2016, 06/12/2017  ? Moderna Sars-Covid-2 Vaccination 09/12/2019, 10/14/2019, 05/26/2020, 12/02/2020  ? PPD Test 06/12/2017, 12/22/2018  ? Pneumococcal Conjugate-13 08/05/2014  ? Pneumococcal Polysaccharide-23 02/11/2006  ? Td 04/18/2005  ? Zoster, Live 05/26/2013  ? ?Pertinent  Health Maintenance Due  ?Topic Date Due  ? INFLUENZA VACCINE  Completed  ? DEXA SCAN  Completed  ? ?Fall Risk 06/12/2021 06/13/2021 06/29/2021 09/08/2021 09/21/2021  ?Falls in the past year? - - 1 0 1  ?Was there an injury with Fall? - - 0 0 0  ?Fall Risk Category Calculator - - 1 0 2  ?Fall Risk Category - - Low Low Moderate  ?Patient Fall Risk Level Moderate fall risk Moderate fall risk Low  fall risk Low fall risk Moderate fall risk  ?Patient at Risk for Falls Due to - - Other (Comment) No Fall Risks History of fall(s)  ?Fall risk Follow up - - Falls evaluation completed Falls evaluation completed Falls evaluation completed  ? ?Functional Status Survey: ?  ? ?Vitals:  ? 09/21/21 0841  ?BP: 112/70  ?Pulse: 82  ?Temp: (!) 97.3 ?F (36.3 ?C)  ?TempSrc: Temporal  ?Weight: 113 lb (51.3 kg)  ?Height: '5\' 6"'$  (1.676 m)  ? ?Body mass index is 18.24 kg/m?Marland Kitchen ?Physical Exam ?Vitals reviewed.  ?Constitutional:   ?   General: She is not in acute distress. ?   Appearance: Normal appearance.  She is underweight. She is not ill-appearing or diaphoretic.  ?HENT:  ?   Head: Normocephalic.  ?   Right Ear: Tympanic membrane, ear canal and external ear normal. There is no impacted cerumen.  ?   Left Ear: Tym

## 2021-09-21 NOTE — Patient Instructions (Addendum)
-   continue with Zinc oxide cream twice daily for skin protection. ? ?- encourage small but frequent meals through out the day if she refuses meals ? ?- May administer medication in applesauce /pudding  ? ?- Ensure protein supplement once daily between meals  ?

## 2021-09-22 LAB — CBC WITH DIFFERENTIAL/PLATELET
Absolute Monocytes: 451 cells/uL (ref 200–950)
Basophils Absolute: 18 cells/uL (ref 0–200)
Basophils Relative: 0.4 %
Eosinophils Absolute: 69 cells/uL (ref 15–500)
Eosinophils Relative: 1.5 %
HCT: 34 % — ABNORMAL LOW (ref 35.0–45.0)
Hemoglobin: 11.4 g/dL — ABNORMAL LOW (ref 11.7–15.5)
Lymphs Abs: 1302 cells/uL (ref 850–3900)
MCH: 31.8 pg (ref 27.0–33.0)
MCHC: 33.5 g/dL (ref 32.0–36.0)
MCV: 94.7 fL (ref 80.0–100.0)
MPV: 11.5 fL (ref 7.5–12.5)
Monocytes Relative: 9.8 %
Neutro Abs: 2760 cells/uL (ref 1500–7800)
Neutrophils Relative %: 60 %
Platelets: 159 10*3/uL (ref 140–400)
RBC: 3.59 10*6/uL — ABNORMAL LOW (ref 3.80–5.10)
RDW: 11.8 % (ref 11.0–15.0)
Total Lymphocyte: 28.3 %
WBC: 4.6 10*3/uL (ref 3.8–10.8)

## 2021-09-22 LAB — COMPLETE METABOLIC PANEL WITH GFR
AG Ratio: 1 (calc) (ref 1.0–2.5)
ALT: 9 U/L (ref 6–29)
AST: 17 U/L (ref 10–35)
Albumin: 4.1 g/dL (ref 3.6–5.1)
Alkaline phosphatase (APISO): 40 U/L (ref 37–153)
BUN/Creatinine Ratio: 22 (calc) (ref 6–22)
BUN: 22 mg/dL (ref 7–25)
CO2: 28 mmol/L (ref 20–32)
Calcium: 9.8 mg/dL (ref 8.6–10.4)
Chloride: 102 mmol/L (ref 98–110)
Creat: 1 mg/dL — ABNORMAL HIGH (ref 0.60–0.95)
Globulin: 4.3 g/dL (calc) — ABNORMAL HIGH (ref 1.9–3.7)
Glucose, Bld: 80 mg/dL (ref 65–99)
Potassium: 4.3 mmol/L (ref 3.5–5.3)
Sodium: 140 mmol/L (ref 135–146)
Total Bilirubin: 0.5 mg/dL (ref 0.2–1.2)
Total Protein: 8.4 g/dL — ABNORMAL HIGH (ref 6.1–8.1)
eGFR: 53 mL/min/{1.73_m2} — ABNORMAL LOW (ref >=60)

## 2021-09-26 ENCOUNTER — Encounter: Payer: Self-pay | Admitting: Nurse Practitioner

## 2021-09-26 ENCOUNTER — Other Ambulatory Visit: Payer: Medicare Other | Admitting: Nurse Practitioner

## 2021-09-26 ENCOUNTER — Other Ambulatory Visit: Payer: Self-pay

## 2021-09-26 DIAGNOSIS — Z515 Encounter for palliative care: Secondary | ICD-10-CM

## 2021-09-26 DIAGNOSIS — R634 Abnormal weight loss: Secondary | ICD-10-CM

## 2021-09-26 DIAGNOSIS — F0150 Vascular dementia without behavioral disturbance: Secondary | ICD-10-CM

## 2021-09-26 NOTE — Progress Notes (Signed)
? ? ?Manufacturing engineer ?Community Palliative Care Consult Note ?Telephone: [phone]  ?Fax: [phone]  ? ?Date of encounter: 09/26/21 ?3:27 PM ?PATIENT NAME: Kathleen [place]leg[place] ?[place] [address]   ?[phone] (home)  ?DOB: 11-Dec-1925 ?MRN: [id number] ?PRIMARY CARE PROVIDER:    ?Kathleen Gallegos, Nelda Bucks, NP,  ?[address] ?[place] [address] ?[phone] ? ?REFERRING PROVIDER:   ?Kathleen Hughs, NP ?[address] ?[place],  [place] [address] ?[phone] ? ?RESPONSIBLE PARTY:    ?Contact Information   ? ? Name Relation Home Work Mobile  ? Kathleen Gallegos, Kathleen Gallegos Daughter [phone]  [phone]  ? Kathleen Gallegos Daughter [phone]  [phone]  ? ?  ? ?Due to the COVID-19 crisis, this visit was done via telemedicine from my office and it was initiated and consent by this patient and or family. ? ?I connected with Kathleen Gallegos, Kathleen Gallegos daughter with Kathleen Gallegos OR PROXY on 09/26/21 by telephone as video not available enabled telemedicine application and verified that I am speaking with the correct person using two identifiers. ?  ?I discussed the limitations of evaluation and management by telemedicine. The patient expressed understanding and agreed to proceed. Palliative Care was asked to follow this patient by consultation request of  Kathleen Gallegos, Kathleen C, NP to address advance care planning and complex medical decision making. This is the initial visit.                 ?ASSESSMENT AND PLAN / RECOMMENDATIONS:  ?Symptom Management/Plan: ?1. Advance Care Planning;  DNR ? ?2. Vascular dementia with progression, wishes to focus on comfort care will revisit hospice following PC RN/SW visit in 1 week for further exploration of decline functionally, cognitively ? ?3. Weight loss secondary to dementia. Discussed weight loss, current weight of 113 lbs. We talked about nutrition, supplements, education completed.  ? ?04/08/2020 weight 133 lbs ?05/03/2021 weight 119 lbs ?09/21/2021 weight  113 lbs ?BMI 18.24 ?20 lbs/16 months with 15.04% ?6 lbs loss in 6 months with 5.04% ? ?09/21/2021 albumin 4.1; total protein 8.4 ?Sodium 140; potassium 4.3; chloride 102; Co2 28; calcium 9.8; bun 22; creatinine 1.o; glucose 80 ? ?4. Goals of Care: Goals include to maximize quality of life and symptom management. Our advance care planning conversation included a discussion about:    ?The value and importance of advance care planning  ?Exploration of personal, cultural or spiritual beliefs that might influence medical decisions  ?Exploration of goals of care in the event of a sudden injury or illness  ?Identification and preparation of a healthcare agent  ?Review and updating or creation of an advance directive document. ? ?5. Palliative care encounter; Palliative care encounter; Palliative medicine team will continue to support patient, patient's family, and medical team. Visit consisted of counseling and education dealing with the complex and emotionally intense issues of symptom management and palliative care in the setting of serious and potentially life-threatening illness ? ?Follow up Palliative Care Visit: Palliative care will continue to follow for complex medical decision making, advance care planning, and clarification of goals. Return 1  weeks with PC RN/SW or prn. ? ?I spent 62 minutes providing this consultation. More than 50% of the time in this consultation was spent in counseling and care coordination. ? ?PPS: 40% ? ?Chief Complaint: Initial palliative consult for complex medical decision making ? ?HISTORY OF PRESENT ILLNESS:  Kathleen Gallegos is a 88 y.o. year old female  with multiple medical problems including dementia, asthma, lung nodule, OA, HLD,  IBS, GERD, allergies, anxiety. Discharged from [facility] on 09/02/2021. I called Kathleen Gallegos for telemedicine telephonic as video not available. We talked about purpose of PC visit, Kathleen Gallegos in agreement. We talked about the last time Ms.  Gallegos was independent, living on her own, transition with Kathleen Gallegos to moving in with her. We talked about past medical history, family dynamics, life review, ros, symptoms, functional and cognitive changes. We talked about ADL's which [name] requires to have assistance. We talked about weight loss, appetite, nutrition. We talked about sleep and behaviors. We talked about medical goals. We talked about role pc in poc. Long PC visit due to Kathleen Gallegos talking a lengthily time with detailed information to questions. We talked about upcoming PC visit with PC RN/SW. We talked about when Hospice did AV visit over a year ago, resulted not enough decline to be eligible for Hospice services. Discussed recent primary provider visit did a referral to hospice services. Will review with Hospice physicians for eligibility following PC RN/SW visit so more information can be collected, observations. Kathleen Gallegos in agreement. Therapeutic listening, emotional support provided. Questions answered.  ? ?History obtained from review of EMR, discussion with Kathleen Gallegos, Kathleen Gallegos daughter with Kathleen Gallegos.  ?I reviewed available labs, medications, imaging, studies and related documents from the EMR.  Records reviewed and summarized above.  ? ?ROS ?10 point system reviewed with Kathleen Gallegos daughter, Kathleen Gallegos negative except HPI ? ?Physical Exam: ?deferred ?CURRENT PROBLEM LIST:  ?Patient Active Problem List  ? Diagnosis Date Noted  ? Benign hypertension with chronic kidney disease, stage III (Kathleen Gallegos) 02/22/2021  ? Chronic kidney disease, stage 3a (Kathleen Gallegos) 02/22/2021  ? Anemia due to stage 3a chronic kidney disease (Kathleen Gallegos) 02/22/2021  ? Unspecified symptoms and signs involving cognitive functions and awareness 12/24/2016  ? Vitamin D deficiency 03/12/2015  ? Dementia (Kathleen Gallegos) 01/04/2015  ? Osteoporosis 08/05/2014  ? Medicare annual wellness visit, subsequent 04/07/2014  ? Cough variant asthma 07/28/2013  ? Dry skin dermatitis 02/15/2013  ? HEMORRHOIDS,  WITH BLEEDING 07/27/2009  ? TRANSIENT ISCHEMIC ATTACK 03/29/2009  ? PALPITATIONS 03/25/2009  ? IRRITABLE BOWEL SYNDROME 10/05/2008  ? FECAL INCONTINENCE 10/05/2008  ? GERD 05/24/2008  ? Hyperlipemia 11/10/2007  ? Osteoarthritis 11/10/2007  ? Allergic rhinitis due to pollen 09/18/2007  ? LUNG NODULE 09/18/2007  ? ?PAST MEDICAL HISTORY:  ?Active Ambulatory Problems  ?  Diagnosis Date Noted  ? Hyperlipemia 11/10/2007  ? TRANSIENT ISCHEMIC ATTACK 03/29/2009  ? HEMORRHOIDS, WITH BLEEDING 07/27/2009  ? Allergic rhinitis due to pollen 09/18/2007  ? LUNG NODULE 09/18/2007  ? GERD 05/24/2008  ? IRRITABLE BOWEL SYNDROME 10/05/2008  ? Osteoarthritis 11/10/2007  ? PALPITATIONS 03/25/2009  ? FECAL INCONTINENCE 10/05/2008  ? Dry skin dermatitis 02/15/2013  ? Cough variant asthma 07/28/2013  ? Medicare annual wellness visit, subsequent 04/07/2014  ? Osteoporosis 08/05/2014  ? Dementia ([place]) 01/04/2015  ? Vitamin D deficiency 03/12/2015  ? Unspecified symptoms and signs involving cognitive functions and awareness 12/24/2016  ? Benign hypertension with chronic kidney disease, stage III ([place]) 02/22/2021  ? Chronic kidney disease, stage 3a ([place]) 02/22/2021  ? Anemia due to stage 3a chronic kidney disease ([place]) 02/22/2021  ? ?Resolved Ambulatory Problems  ?  Diagnosis Date Noted  ? TINEA PEDIS 05/24/2008  ? Presenile dementia, uncomplicated ([place]) 87/56/4332  ? Generalized anxiety disorder 11/10/2007  ? Carpal tunnel syndrome 07/21/2007  ? UNSPECIFIED PERIPHERAL VERTIGO 11/28/2009  ? Allergic-infective asthma 09/18/2007  ? CONTACT DERMATITIS&OTHER ECZEMA DUE UNSPEC CAUSE 09/23/2008  ? HIP PAIN,  RIGHT 06/08/2010  ? Senile osteoporosis 07/25/2008  ? Hyposomnia, insomnia or sleeplessness associated with anxiety 12/04/2009  ? FATIGUE 11/26/2008  ? CHEST PAIN UNSPECIFIED 11/26/2008  ? ANGIOEDEMA 09/18/2007  ? Cough 08/15/2010  ? Encounter for antineoplastic immunotherapy 12/20/2010  ? Carotid bruit 12/27/2011  ? Pancytopenia ([place]) 12/27/2011   ? MCI (mild cognitive impairment) with memory loss 05/11/2012  ? Seasonal allergies 07/27/2012  ? Pharyngitis, acute 07/27/2012  ? Routine general medical examination at a health care facility 01/21/2013  ?

## 2021-09-27 ENCOUNTER — Telehealth: Payer: Self-pay

## 2021-09-27 NOTE — Telephone Encounter (Signed)
88 am.  Spoke with Lattie Haw with PCP office regarding a hospice referral for patient.  Advised that our office did not receive the referral and requested it be faxed.  Fax number provided. ?

## 2021-09-28 NOTE — Progress Notes (Signed)
See RN note for this visit.  ?

## 2021-09-29 ENCOUNTER — Other Ambulatory Visit: Payer: Medicare Other

## 2021-09-29 ENCOUNTER — Other Ambulatory Visit: Payer: Self-pay

## 2021-09-29 ENCOUNTER — Telehealth: Payer: Self-pay

## 2021-09-29 VITALS — BP 110/70 | HR 72 | Temp 97.5°F | Resp 18 | Wt 113.0 lb

## 2021-09-29 DIAGNOSIS — Z515 Encounter for palliative care: Secondary | ICD-10-CM

## 2021-09-29 NOTE — Progress Notes (Signed)
PATIENT NAME: Kathleen Gallegos ?DOB: 19-Jan-1926 ?MRN: [id number] ? ?PRIMARY CARE PROVIDER: Ngetich, Nelda Bucks, NP ? ?RESPONSIBLE PARTY:  ?Acct ID - Guarantor Home Phone Work Phone Relationship Acct Type  ?[id number] [name]* [phone]  Self P/F  ?   [place], [place], [place] [address]  ? ? ?PLAN OF CARE and INTERVENTIONS: ?              1.  GOALS OF CARE/ ADVANCE CARE PLANNING:  Possible placement or in home assistance.  Discussed ACP with family.  [name] desires DNR status and [name] would like to speak with PCP before making a decision.  ?              2.  PATIENT/CAREGIVER EDUCATION:  Hospice vs Palliative Care.  ?              4. PERSONAL EMERGENCY PLAN:  Activate 911 for emergencies.  ?              5.  DISEASE STATUS: ? ?Dementia:  Seeing a decline functionally and cognitively.  Patient does not initiate conversation but will speak when spoken to.  She is requiring more assistance with ambulation.  No longer able to stand and walk independently.  Daughter is now assisting patient to standing position from her bed and chair.  Has to guide patient from room to room.  Patient is slower with ambulation and stiffer. Often leaning back when assisting to standing position.  Continues to have falls with the most recent last Friday.  No injuries were reported by daughter. Requires more cues to complete ADL's.  Daughter is dressing and providing incontinence care. Has a Actor with showers.  Appetite has declined. Eating smaller portions of meals and takes close to 1 hour to complete a meals.  Occasionally requires cues to feed herself.  Weight loss noted. Temporal wasting present and thinner/frail in appearance since our last visit.  See below. Discussed decline in patient since our last visit.  Family advised care is becoming more difficulty and they have also seen the decline.  They are seeking hospice support is patient qualifies.  Case reviewed by Dr. Jewel Baize and approval received for  hospice.  PCP office has already sent over the referral.   ? ?  04/08/2020 weight 133 lbs ?05/03/2021 weight 119 lbs ?09/21/2021 weight 113 lbs ?BMI 18.24 ?20 lbs/16 months with 15.04% ?6 lbs loss in 6 months with 5.04% ?  ? ?HISTORY OF PRESENT ILLNESS:  Kathleen Gallegos is a 88 y.o. year old female  with multiple medical problems including dementia, asthma, lung nodule, OA, HLD, IBS, GERD, allergies, anxiety.  ? ?CODE STATUS: DNR ?ADVANCED DIRECTIVES: N ?MOST FORM: No ?PPS: 40% ? ? ?PHYSICAL EXAM:  ? ?VITALS: ?Today's Vitals  ? 09/29/21 1138  ?BP: 110/70  ?Pulse: 72  ?Resp: 18  ?Temp: (!) 97.5 ?F (36.4 ?C)  ?SpO2: 93%  ?Weight: 113 lb (51.3 kg)  ?  ?LUNGS: clear to auscultation  ?CARDIAC: Cor RRR}  ?EXTREMITIES: - for edema ?SKIN: Skin color, texture, turgor normal. No rashes or lesions or Stage 2 dime size on the left buttock and coccyx.  Daughter is using destin.   ?NEURO: positive for memory problems and weakness ? ? ? ? ? ? ?Kathleen Burton, RN ? ?

## 2021-09-29 NOTE — Progress Notes (Signed)
COMMUNITY PALLIATIVE CARE SW NOTE ? ?PATIENT NAME: Kathleen Gallegos ?DOB: 08-Jul-1926 ?MRN: [id number] ? ?PRIMARY CARE PROVIDER: Ngetich, Nelda Bucks, NP ? ?RESPONSIBLE PARTY:  ?Acct ID - Guarantor Home Phone Work Phone Relationship Acct Type  ?[id number] [name]* [phone]  Self P/F  ?   [place], [place], [place] [address]  ? ? ? ?PLAN OF CARE and INTERVENTIONS:            ? ? GOALS OF CARE/ ADVANCE CARE PLANNING: Goals include to maximize quality of life and symptom management. Our advance care planning conversation included a discussion about:    ?The value and importance of advance care planning  ?Review and updating or creation of an advance directive document.  ?                         Patient is a currently a full code. However family/guardians desire for patient to be DNR. Daughter share that patient was a DNR while in SNF. Family to outreach PCP office to request DNR for the home. ?  ?Palliative care encounter: Palliative medicine team will continue to support patient, patient's family, and medical team. Visit consisted of counseling and education dealing with the complex and emotionally intense issues of symptom management and palliative care in the setting of serious and potentially life-threatening illness. ? ?SW and RN met with patient, patient daughters [name] and [name] and SIL Zenia Resides. Patient has progressing dementia. ? ?Functional changes/updates: Patient has not had any functional changes since his recent PC visit and discharge from [facility]. Patient was to DC to home with [facility], family deferred [facility] services until they had a discussion with PCP. Patients PCP has recommended and wrote order for Hospice services for patient. Patient has had a physical functional decline. Patient is more incontinent of B/B. Patient requires more assistance and cueing with transfers and ambulating. Patient has had a 6 lb weight loss. Patient is having more disease progression. ? ?Psychosocial assessment:  completed. No additional physical needs identified at this time. Patient has family friend, Trixie Rude who comes into the home and assist patient with ADL's, bathing specifically, when she is able to. Ongoing support/resources will continued to be offered if needed.  ? ?Military hx: N/A  ? ?Medical assessment: RN reviewed medications and took vitals.  ? ?Hospice discussion: Hospice eligibility discussed with family. Family is in agreement with hospice services and support. Hospice provider has deemed patient eligible for hospice. PC team discussed and examined initial hospice contact process and that PC will DC once hospice is in place. Family stated understanding.  ? ?SW discussed goals, reviewed care plan, provided emotional support, used active and reflective listening in the form of reciprocity emotional response. Questions and concerns were addressed. The patient/family was encouraged to call with any additional questions and/or concerns. PC Provided general support and encouragement, no other unmet needs identified. ? ?PC will DC once Hospice starts services. Today will PC last in home visit with patient, unless otherwise advised.  ? ? ?3.         PATIENT/CAREGIVER EDUCATION/ COPING:   ?Appearance: well groomed, appropriate given situation  ?Mental Status: Alert/oriented. ?Eye Contact: Good. Able to engage in proper eye contact  ?Thought Process: irrational  ?Thought Content: not assessed  ?Speech: Normal rate, volume, tone  ?Mood: Normal and calm ?Affect: Congruent to endorsed mood, full ranging ?Insight: poor ?Judgement: poor ?Interaction Style: Cooperative ? ?4.  PERSONAL EMERGENCY PLAN:  family will call 9-1-1 for emergencies.   ? ?5.         COMMUNITY RESOURCES COORDINATION/ HEALTH CARE NAVIGATION:  family manages her care. ? ?6.      FINANCIAL CONCERNS/NEEDS: None. ?                        Primary Health Insurance: Medicare ?Secondary Health Insurance: Dana ?Prescription Coverage: Yes, no history of  difficulty obtaining or affording prescriptions reported ?   ? ?SOCIAL HX:  ?Social History  ? ?Tobacco Use  ? Smoking status: Never  ? Smokeless tobacco: Former  ?  Types: Snuff  ? Tobacco comments:  ?  Quit lat 40's early 13's  ?Substance Use Topics  ? Alcohol use: No  ? ? ?CODE STATUS: wishes to be DNR ?ADVANCED DIRECTIVES: Y ?MOST FORM COMPLETE:  N ?HOSPICE EDUCATION PROVIDED: Y. Hospice eligible ? ?PPS: 30-40% ? ? ?Time spent: 1.5 hr ? ? ? ? ? ?Doreene Eland, LCSW ? ?

## 2021-09-29 NOTE — Telephone Encounter (Signed)
Noted.  ? ?Patient daughter "[name]" called back office. Clinical Intake transferred patient to my extension. Patient daughter left voicemail on my phone. I reached back out and no answer. I left voicemail for patient daughter to call office back.  ?3:01pm ? ? ? ? ? ?Patient daughter called back and was informed. She states that she cannot do video visit because she wasn't able to figure it out last time she spoke to [place]. Patient daughter states that she will ask Hospice to fill out form. Message routed to PCP Ngetich, [name] C, NP . ?3:14pm ?

## 2021-09-29 NOTE — Telephone Encounter (Signed)
May schedule video visit.then will send message via phone to sign.  ?

## 2021-09-29 NOTE — Telephone Encounter (Signed)
Noted  

## 2021-09-29 NOTE — Telephone Encounter (Signed)
Patient daughter "Fronia Depass" called and states that patient was seen last week for blood work. She states that no phone call has been made about the results. Patient daughter also states that she needs DNR form filled out for mother ASAP. She states that Nurse, Sister, and Hospice all need that form on hand. Message routed to PCP Ngetich, Nelda Bucks, NP  ?

## 2021-09-29 NOTE — Telephone Encounter (Signed)
Called patient daughter "Divine Imber" to notify her that DNR form can either be filled out by Hospice or filled out by PCP Ngetich, [name] C, NP . However if done by [name], this will have to be an in office visit due to signing of both PCP Ngetich, Nelda Bucks, NP and daughter "Henritta Mutz". ?

## 2021-10-02 ENCOUNTER — Telehealth: Payer: Self-pay | Admitting: *Deleted

## 2021-10-02 NOTE — Telephone Encounter (Signed)
Patient daughter, [name], called and left message on clinical intake and stated that she has tested Positive for Covid and she is the Caregiver of patient.  ?She has noticed patient coughing.  ? ? ?Tried calling daughter back to schedule a MyChart Visit but NO Answer. Will try again later.  ?

## 2021-10-02 NOTE — Telephone Encounter (Signed)
Patient daughter called back and scheduled a MyChart Visit for 10/03/2021. ?

## 2021-10-03 ENCOUNTER — Other Ambulatory Visit: Payer: Self-pay

## 2021-10-03 ENCOUNTER — Observation Stay (HOSPITAL_COMMUNITY)
Admission: EM | Admit: 2021-10-03 | Discharge: 2021-10-05 | Disposition: A | Discharge status: Skilled Nursing Facility | Payer: Medicare Other | Attending: Student | Admitting: Student

## 2021-10-03 ENCOUNTER — Encounter: Payer: Self-pay | Admitting: Family

## 2021-10-03 ENCOUNTER — Telehealth (INDEPENDENT_AMBULATORY_CARE_PROVIDER_SITE_OTHER): Payer: Medicare Other | Admitting: Family

## 2021-10-03 ENCOUNTER — Emergency Department (HOSPITAL_COMMUNITY): Payer: Medicare Other

## 2021-10-03 ENCOUNTER — Telehealth: Payer: Self-pay | Admitting: *Deleted

## 2021-10-03 ENCOUNTER — Encounter (HOSPITAL_COMMUNITY): Payer: Self-pay

## 2021-10-03 DIAGNOSIS — F0390 Unspecified dementia without behavioral disturbance: Secondary | ICD-10-CM | POA: Diagnosis not present

## 2021-10-03 DIAGNOSIS — J9811 Atelectasis: Secondary | ICD-10-CM | POA: Diagnosis not present

## 2021-10-03 DIAGNOSIS — R002 Palpitations: Secondary | ICD-10-CM | POA: Diagnosis not present

## 2021-10-03 DIAGNOSIS — F32A Depression, unspecified: Secondary | ICD-10-CM | POA: Diagnosis present

## 2021-10-03 DIAGNOSIS — Z79899 Other long term (current) drug therapy: Secondary | ICD-10-CM | POA: Diagnosis not present

## 2021-10-03 DIAGNOSIS — U071 COVID-19: Principal | ICD-10-CM | POA: Diagnosis present

## 2021-10-03 DIAGNOSIS — J069 Acute upper respiratory infection, unspecified: Secondary | ICD-10-CM | POA: Diagnosis not present

## 2021-10-03 DIAGNOSIS — N183 Chronic kidney disease, stage 3 unspecified: Secondary | ICD-10-CM | POA: Diagnosis not present

## 2021-10-03 DIAGNOSIS — D696 Thrombocytopenia, unspecified: Secondary | ICD-10-CM | POA: Diagnosis present

## 2021-10-03 DIAGNOSIS — J45909 Unspecified asthma, uncomplicated: Secondary | ICD-10-CM | POA: Diagnosis not present

## 2021-10-03 DIAGNOSIS — Z743 Need for continuous supervision: Secondary | ICD-10-CM | POA: Diagnosis not present

## 2021-10-03 DIAGNOSIS — R509 Fever, unspecified: Secondary | ICD-10-CM | POA: Diagnosis not present

## 2021-10-03 DIAGNOSIS — Z8673 Personal history of transient ischemic attack (TIA), and cerebral infarction without residual deficits: Secondary | ICD-10-CM | POA: Insufficient documentation

## 2021-10-03 DIAGNOSIS — R531 Weakness: Secondary | ICD-10-CM | POA: Diagnosis not present

## 2021-10-03 LAB — CBC WITH DIFFERENTIAL/PLATELET
Abs Immature Granulocytes: 0.02 10*3/uL (ref 0.00–0.07)
Basophils Absolute: 0 10*3/uL (ref 0.0–0.1)
Basophils Relative: 0 %
Eosinophils Absolute: 0 10*3/uL (ref 0.0–0.5)
Eosinophils Relative: 0 %
HCT: 36.1 % (ref 36.0–46.0)
Hemoglobin: 11.5 g/dL — ABNORMAL LOW (ref 12.0–15.0)
Immature Granulocytes: 0 %
Lymphocytes Relative: 12 %
Lymphs Abs: 0.6 10*3/uL — ABNORMAL LOW (ref 0.7–4.0)
MCH: 30.9 pg (ref 26.0–34.0)
MCHC: 31.9 g/dL (ref 30.0–36.0)
MCV: 97 fL (ref 80.0–100.0)
Monocytes Absolute: 0.8 10*3/uL (ref 0.1–1.0)
Monocytes Relative: 17 %
Neutro Abs: 3.2 10*3/uL (ref 1.7–7.7)
Neutrophils Relative %: 71 %
Platelets: 124 10*3/uL — ABNORMAL LOW (ref 150–400)
RBC: 3.72 MIL/uL — ABNORMAL LOW (ref 3.87–5.11)
RDW: 13.1 % (ref 11.5–15.5)
WBC: 4.5 10*3/uL (ref 4.0–10.5)
nRBC: 0 % (ref 0.0–0.2)

## 2021-10-03 LAB — COMPREHENSIVE METABOLIC PANEL
ALT: 11 U/L (ref 0–44)
AST: 20 U/L (ref 15–41)
Albumin: 3.7 g/dL (ref 3.5–5.0)
Alkaline Phosphatase: 35 U/L — ABNORMAL LOW (ref 38–126)
Anion gap: 10 (ref 5–15)
BUN: 19 mg/dL (ref 8–23)
CO2: 25 mmol/L (ref 22–32)
Calcium: 9.1 mg/dL (ref 8.9–10.3)
Chloride: 104 mmol/L (ref 98–111)
Creatinine, Ser: 1.06 mg/dL — ABNORMAL HIGH (ref 0.44–1.00)
GFR, Estimated: 49 mL/min — ABNORMAL LOW (ref >60)
Glucose, Bld: 102 mg/dL — ABNORMAL HIGH (ref 70–99)
Potassium: 3.7 mmol/L (ref 3.5–5.1)
Sodium: 139 mmol/L (ref 135–145)
Total Bilirubin: 0.7 mg/dL (ref 0.3–1.2)
Total Protein: 8.2 g/dL — ABNORMAL HIGH (ref 6.5–8.1)

## 2021-10-03 LAB — RESP PANEL BY RT-PCR (FLU A&B, COVID) ARPGX2
Influenza A by PCR: NEGATIVE
Influenza B by PCR: NEGATIVE
SARS Coronavirus 2 by RT PCR: POSITIVE — AB

## 2021-10-03 LAB — LACTIC ACID, PLASMA
Lactic Acid, Venous: 1 mmol/L (ref 0.5–1.9)
Lactic Acid, Venous: 1 mmol/L (ref 0.5–1.9)

## 2021-10-03 MED ORDER — ZINC SULFATE 220 (50 ZN) MG PO CAPS
220.0000 mg | ORAL_CAPSULE | Freq: Every day | ORAL | Status: DC
Start: 2021-10-04 — End: 2021-10-05
  Administered 2021-10-04 – 2021-10-05 (×2): 220 mg via ORAL
  Filled 2021-10-03 (×2): qty 1

## 2021-10-03 MED ORDER — ASCORBIC ACID 500 MG PO TABS
500.0000 mg | ORAL_TABLET | Freq: Every day | ORAL | Status: DC
  Administered 2021-10-04 – 2021-10-05 (×2): 500 mg via ORAL
  Filled 2021-10-03 (×2): qty 1

## 2021-10-03 MED ORDER — ENOXAPARIN SODIUM 30 MG/0.3ML IJ SOSY
30.0000 mg | PREFILLED_SYRINGE | INTRAMUSCULAR | Status: DC
  Administered 2021-10-04 – 2021-10-05 (×2): 30 mg via SUBCUTANEOUS
  Filled 2021-10-03 (×2): qty 0.3

## 2021-10-03 MED ORDER — ZINC GLUCONATE 50 MG PO TABS: 50.0000 mg | ORAL_TABLET | Freq: Every day | ORAL | 0 refills | Status: DC

## 2021-10-03 MED ORDER — VITAMIN C 1000 MG PO TABS: 1000.0000 mg | ORAL_TABLET | Freq: Every day | ORAL | 0 refills | Status: DC

## 2021-10-03 MED ORDER — SODIUM CHLORIDE 0.9 % IV BOLUS
500.0000 mL | Freq: Once | INTRAVENOUS | Status: AC
  Administered 2021-10-03: 500 mL via INTRAVENOUS

## 2021-10-03 MED ORDER — GUAIFENESIN-DM 100-10 MG/5ML PO SYRP: 10.0000 mL | ORAL_SOLUTION | ORAL | Status: DC | PRN

## 2021-10-03 MED ORDER — MELATONIN 3 MG PO TABS: 6.0000 mg | ORAL_TABLET | Freq: Every evening | ORAL | Status: DC | PRN

## 2021-10-03 MED ORDER — ACETAMINOPHEN 325 MG PO TABS: 650.0000 mg | ORAL_TABLET | Freq: Four times a day (QID) | ORAL | Status: DC | PRN

## 2021-10-03 MED ORDER — ACETAMINOPHEN 325 MG PO TABS
650.0000 mg | ORAL_TABLET | Freq: Once | ORAL | Status: AC
  Administered 2021-10-03: 650 mg via ORAL
  Filled 2021-10-03: qty 2

## 2021-10-03 MED ORDER — HYDROCOD POLI-CHLORPHE POLI ER 10-8 MG/5ML PO SUER: 5.0000 mL | Freq: Two times a day (BID) | ORAL | Status: DC | PRN

## 2021-10-03 MED ORDER — MOLNUPIRAVIR EUA 200MG CAPSULE: 4.0000 | ORAL_CAPSULE | Freq: Two times a day (BID) | ORAL | 0 refills | Status: DC

## 2021-10-03 MED ORDER — VITAMIN D3 25 MCG (1000 UNIT) PO TABS: 1000.0000 [IU] | ORAL_TABLET | Freq: Every morning | ORAL | 0 refills | Status: AC

## 2021-10-03 MED ORDER — NIRMATRELVIR/RITONAVIR (PAXLOVID)TABLET
2.0000 | ORAL_TABLET | Freq: Two times a day (BID) | ORAL | Status: DC
  Filled 2021-10-03: qty 30

## 2021-10-03 MED ORDER — NIRMATRELVIR/RITONAVIR (PAXLOVID) TABLET (RENAL DOSING)
2.0000 | ORAL_TABLET | Freq: Two times a day (BID) | ORAL | Status: DC
  Administered 2021-10-03 – 2021-10-05 (×4): 2 via ORAL
  Filled 2021-10-03: qty 20

## 2021-10-03 NOTE — Telephone Encounter (Signed)
Noted  

## 2021-10-03 NOTE — ED Triage Notes (Signed)
Pt covid positive yesterday. Fever of 100.3 noted. Daughter said pt is not acting herself. Pt has dementia.  ?

## 2021-10-03 NOTE — Progress Notes (Signed)
?This service is provided via telemedicine ? ?No vital signs collected/recorded due to the encounter was a telemedicine visit.  ? ?Location of patient (ex: home, work):  Home. ? ?Patient consents to a telephone visit:  Yes ? ?Location of the provider (ex: office, home):  [employer]. ? ?Name of any referring provider:  Teresa Nicodemus, Nelda Bucks, NP  ? ?Names of all persons participating in the telemedicine service and their role in the encounter:  Patient, Heriberto Antigua, [place], [place], [place], NP.   ? ?Time spent on call: 8 minutes spent on the phone with Medical Assistant.   ? ? ? ? ?Provider: Marlowe Sax FNP-C ? ?Gene Colee, Nelda Bucks, NP ? ?Patient Care Team: ?Koralyn Prestage, Nelda Bucks, NP as PCP - General (Family Medicine) ?Penland, Kelby Fam, MD (Inactive) as Consulting Physician (Hematology and Oncology) ?Rutherford Guys, MD as Consulting Physician (Ophthalmology) ?Cameron Sprang, MD as Consulting Physician (Neurology) ? ?Extended Emergency Contact Information ?Primary Emergency Contact: Quintela,[name] W ?Address: [address] ?         [place], [place] [address] [place] ?Home Phone: [phone] ?Mobile Phone: [phone] ?Relation: Daughter ?Secondary Emergency Contact: Thompson,Norma ?Home Phone: [phone] ?Mobile Phone: [phone] ?Relation: Daughter ? ?Code Status:  Full Code  ?Goals of care: Advanced Directive information ? ?  10/03/2021  ?  9:34 AM  ?Advanced Directives  ?Does Patient Have a Medical Advance Directive? Yes  ?Type of Advance Directive Healthcare Power of Attorney  ?Does patient want to make changes to medical advance directive? No - Patient declined  ?Copy of [place] in Chart? Yes - validated most recent copy scanned in chart (See row information)  ? ? ? ?Chief Complaint  ?Patient presents with  ? Acute Visit  ?  Patient daughter "[name]" states that mother tested positive for Covid-19 yesterday 10/02/2021. Patient daughter states that patient symptoms  are fatigue, on/off runny nose, and cough.  ? ? ?HPI:  ?Pt is a 88 y.o. female seen today for an acute visit for evaluation of COVID-19 positive.she tested positive 10/02/2021.Patient's daughter tested positive.she complains of fatigue,runny nose and cough.Daughter reports fever or chills though does not think patient is able to report whenever she has a fever due to her dementia.  No nausea vomiting or diarrhea reported. ? ?Daughter has contacted the brother-in-law and the sister to come assist taking care of the patient since she is also sick with COVID-19 ? ? ?Past Medical History:  ?Diagnosis Date  ? Allergic rhinitis, cause unspecified   ? Angioneurotic edema not elsewhere classified   ? Anxiety disorder   ? Asthma   ? Carotid bruit   ? Carpal tunnel syndrome   ? Chronic abdominal pain   ? Fecal incontinence   ? GERD (gastroesophageal reflux disease)   ? Hyperlipidemia   ? IBS (irritable bowel syndrome)   ? Insomnia   ? Lung nodule   ? Osteoarthritis   ? Palpitations   ? Pancytopenia ([place])   ? Senile osteoporosis   ? ?Past Surgical History:  ?Procedure Laterality Date  ? ABDOMINAL HYSTERECTOMY  1979  ? fibroids   ? bilateral cataract surgery  2008  ? BIOPSY THYROID  1970   ? COLONOSCOPY  12/21/04  ? normal -redundant   ? EUS  08/07/07  ? EYE SURGERY    ? ? ?Allergies  ?Allergen Reactions  ? Diphenhydramine Hcl Other (See Comments)  ?  unknown  ? Rivastigmine Tartrate Palpitations  ? ? ?Outpatient Encounter Medications as  of 10/03/2021  ?Medication Sig  ? Cholecalciferol (VITAMIN D3 PO) Take by mouth every morning.  ? CRANBERRY PO Take 25 mg by mouth daily.  ? donepezil (ARICEPT) 5 MG tablet Take 1 tablet (5 mg total) by mouth at bedtime.  ? fexofenadine (ALLEGRA) 180 MG tablet Take 180 mg by mouth as needed.  ? fluticasone (FLONASE) 50 MCG/ACT nasal spray Place 1 spray into both nostrils as needed.  ? Melatonin 10 MG TABS Take 10 mg by mouth daily.  ? metoprolol succinate (TOPROL-XL) 25 MG 24 hr tablet Take 1  tablet by mouth once daily  ? polyethylene glycol powder (GLYCOLAX/MIRALAX) 17 GM/SCOOP powder Take 17 g by mouth daily. Hold for loose stool  ? sertraline (ZOLOFT) 50 MG tablet Take 1 tablet by mouth once daily  ? ?No facility-administered encounter medications on file as of 10/03/2021.  ? ? ?Review of Systems  ?Constitutional:  Positive for fatigue. Negative for appetite change, chills, fever and unexpected weight change.  ?HENT:  Positive for rhinorrhea. Negative for congestion, dental problem, ear discharge, ear pain, facial swelling, hearing loss, nosebleeds, postnasal drip, sinus pressure, sinus pain, sneezing, sore throat, tinnitus and trouble swallowing.   ?Eyes:  Negative for pain, discharge, redness and itching.  ?Respiratory:  Positive for cough. Negative for chest tightness, shortness of breath and wheezing.   ?Cardiovascular:  Negative for chest pain, palpitations and leg swelling.  ?Gastrointestinal:  Negative for abdominal distention, abdominal pain, blood in stool, constipation, diarrhea, nausea and vomiting.  ?Musculoskeletal:  Positive for arthralgias and gait problem. Negative for back pain, joint swelling, myalgias, neck pain and neck stiffness.  ?Skin:  Negative for color change, pallor and rash.  ?Neurological:  Negative for dizziness, weakness, light-headedness and headaches.  ?Hematological:  Does not bruise/bleed easily.  ?Psychiatric/Behavioral:  Negative for agitation, behavioral problems, hallucinations and sleep disturbance. The patient is not nervous/anxious.   ?     Has not noted any changes in her mental status  ? ?Immunization History  ?Administered Date(s) Administered  ? Fluad Quad(high Dose 65+) 03/25/2019, 04/29/2020, 05/03/2021  ? Influenza Split 03/27/2011, 04/09/2012  ? Influenza Whole 03/31/2007, 03/29/2009, 04/12/2009, 03/14/2010  ? Influenza, High Dose Seasonal PF 04/30/2018  ? Influenza,inj,Quad PF,6+ Mos 03/31/2013, 04/07/2014, 03/08/2015, 02/23/2016, 06/06/2016,  06/12/2017  ? Moderna Sars-Covid-2 Vaccination 09/12/2019, 10/14/2019, 05/26/2020, 12/02/2020  ? PPD Test 06/12/2017, 12/22/2018  ? Pneumococcal Conjugate-13 08/05/2014  ? Pneumococcal Polysaccharide-23 02/11/2006  ? Td 04/18/2005  ? Zoster, Live 05/26/2013  ? ?Pertinent  Health Maintenance Due  ?Topic Date Due  ? INFLUENZA VACCINE  Completed  ? DEXA SCAN  Completed  ? ? ?  06/13/2021  ?  8:17 PM 06/29/2021  ? 11:18 AM 09/08/2021  ?  2:30 PM 09/21/2021  ?  3:39 PM 10/03/2021  ?  9:33 AM  ?Fall Risk  ?Falls in the past year?  1 0 1 1  ?Was there an injury with Fall?  0 0 0 0  ?Fall Risk Category Calculator  1 0 2 2  ?Fall Risk Category  Low Low Moderate Moderate  ?Patient Fall Risk Level Moderate fall risk Low fall risk Low fall risk Moderate fall risk Moderate fall risk  ?Patient at Risk for Falls Due to  Other (Comment) No Fall Risks History of fall(s) History of fall(s)  ?Fall risk Follow up  Falls evaluation completed Falls evaluation completed Falls evaluation completed Falls evaluation completed  ? ?Functional Status Survey: ?  ? ?There were no vitals filed for this visit. ?There  is no height or weight on file to calculate BMI. ?Physical Exam ?Unable to complete on Telephone visit  ? ?Labs reviewed: ?Recent Labs  ?  12/15/20 ?1428 06/12/21 ?1616 09/21/21 ?1609  ?NA 138 136 140  ?K 4.2 4.2 4.3  ?CL 105 102 102  ?CO2 '25 25 28  '$ ?GLUCOSE 88 74 80  ?BUN '17 20 22  '$ ?CREATININE 0.91* 0.98 1.00*  ?CALCIUM 9.1 9.0 9.8  ? ?Recent Labs  ?  12/15/20 ?1428 09/21/21 ?1609  ?AST 22 17  ?ALT 15 9  ?BILITOT 0.5 0.5  ?PROT 7.5 8.4*  ? ?Recent Labs  ?  12/15/20 ?1428 06/12/21 ?1616 09/21/21 ?1609  ?WBC 4.1 6.7 4.6  ?NEUTROABS 1,861 4.4 2,760  ?HGB 10.9* 11.4* 11.4*  ?HCT 33.1* 35.0* 34.0*  ?MCV 96.2 102.3* 94.7  ?PLT 145 153 159  ? ?Lab Results  ?Component Value Date  ? TSH 3.36 12/15/2020  ? ?No results found for: HGBA1C ?Lab Results  ?Component Value Date  ? CHOL 213 (H) 12/15/2020  ? HDL 69 12/15/2020  ? LDLCALC 127 (H) 12/15/2020   ? TRIG 78 12/15/2020  ? CHOLHDL 3.1 12/15/2020  ? ? ?Significant Diagnostic Results in last 30 days:  ?No results found. ? ?Assessment/Plan ?1. COVID-19 virus RNA test result positive at limit of detection ?Patient

## 2021-10-03 NOTE — ED Notes (Signed)
Pt confused for MSE to be signed. ?

## 2021-10-03 NOTE — H&P (Signed)
?History and Physical  ? ? ?Patient: Kathleen Gallegos Kathleen Gallegos:[id number] DOB: 12/20/1925 ?DOA: 10/03/2021 ?DOS: the patient was seen and examined on 10/04/2021 ?PCP: Ngetich, Nelda Bucks, NP  ?Patient coming from: Home ? ?Chief Complaint:  ?Chief Complaint  ?Patient presents with  ? Fever  ? ?HPI: Kathleen Gallegos is a 88 y.o. female with medical history significant of dementia with behavioral ?disturbance, hypertension who presents to the emergency department accompanied by her daughter Constance Gallegos) due to concern for fever.  Patient was unable to provide history possibly due to underlying dementia, history was obtained from ED physician and ED medical record.  Per report, she lives with her daughter Kathleen Gallegos) and both of them contracted COVID-19 virus infection, patient has been having runny nose, cough, weakness since last 1 to 2 days and she was reported to be weaker. ? ?ED course: ?In the emergency department, patient was hemodynamically stable, vital signs were normal and O2 sat was 92-100% on room air.  Work-up in the ED showed normal CBC except for thrombocytopenia, BMP was normal except for slight elevation in creatinine at 1.06 and CBG of 102.  Influenza A, B was negative.  SARS coronavirus 2 was positive. ?Chest x-ray showed bibasilar subsegmental atelectasis. No focal airspace consolidation. ?She was started on Tylenol, Paxlovid and IV hydration.  Hospitalist was asked to admit patient for further evaluation and management. ? ?Review of Systems: As mentioned in the history of present illness. All other systems reviewed and are negative. ? ?Past Medical History:  ?Diagnosis Date  ? Allergic rhinitis, cause unspecified   ? Angioneurotic edema not elsewhere classified   ? Anxiety disorder   ? Asthma   ? Carotid bruit   ? Carpal tunnel syndrome   ? Chronic abdominal pain   ? Fecal incontinence   ? GERD (gastroesophageal reflux disease)   ? Hyperlipidemia   ? IBS (irritable bowel syndrome)   ? Insomnia   ? Lung nodule    ? Osteoarthritis   ? Palpitations   ? Pancytopenia ([place])   ? Senile osteoporosis   ? ?Past Surgical History:  ?Procedure Laterality Date  ? ABDOMINAL HYSTERECTOMY  1979  ? fibroids   ? bilateral cataract surgery  2008  ? BIOPSY THYROID  1970   ? COLONOSCOPY  12/21/04  ? normal -redundant   ? EUS  08/07/07  ? EYE SURGERY    ? ?Social History:  reports that she has never smoked. She has quit using smokeless tobacco.  Her smokeless tobacco use included snuff. She reports that she does not currently use drugs. She reports that she does not drink alcohol. ? ?Allergies  ?Allergen Reactions  ? Diphenhydramine Hcl Other (See Comments)  ?  unknown  ? Rivastigmine Tartrate Palpitations  ? ? ?Family History  ?Problem Relation Age of Onset  ? Hypertension Mother   ? Pancreatic cancer Father   ? Cancer Brother   ? Depression Daughter   ? Heart disease Sister   ? Leukemia Other   ?     family history   ? Colon cancer Other   ?     family history   ? ? ?Prior to Admission medications   ?Medication Sig Start Date End Date Taking? Authorizing Provider  ?cholecalciferol (VITAMIN D) 25 MCG (1000 UNIT) tablet Take 1 tablet (1,000 Units total) by mouth every morning for 14 days. ?Patient taking differently: Take 5,000 Units by mouth every morning. 10/03/21 10/17/21 Yes Ngetich, [name] C, NP  ?CRANBERRY PO Take 25 mg  by mouth daily.   Yes [provider]  ?fexofenadine (ALLEGRA) 180 MG tablet Take 180 mg by mouth as needed.   Yes [provider]  ?fluticasone (FLONASE) 50 MCG/ACT nasal spray Place 1 spray into both nostrils as needed.   Yes [provider]  ?Melatonin 10 MG TABS Take 10 mg by mouth daily.   Yes [provider]  ?metoprolol succinate (TOPROL-XL) 25 MG 24 hr tablet Take 1 tablet by mouth once daily 06/20/21  Yes Ngetich, [name] C, NP  ?polyethylene glycol powder (GLYCOLAX/MIRALAX) 17 GM/SCOOP powder Take 17 g by mouth daily. Hold for loose stool 06/16/20  Yes Ngetich, [name] C, NP  ?sertraline  (ZOLOFT) 50 MG tablet Take 1 tablet by mouth once daily 08/29/21  Yes Ngetich, [name] C, NP  ?Ascorbic Acid (VITAMIN C) 1000 MG tablet Take 1 tablet (1,000 mg total) by mouth daily for 14 days. ?Patient not taking: Reported on 10/03/2021 10/03/21 10/17/21  Ngetich, Nelda Bucks, NP  ?molnupiravir EUA (LAGEVRIO) 200 mg CAPS capsule Take 4 capsules (800 mg total) by mouth 2 (two) times daily for 5 days. 10/03/21 10/08/21  Ngetich, [name] C, NP  ?zinc gluconate 50 MG tablet Take 1 tablet (50 mg total) by mouth daily for 14 days. 10/03/21 10/17/21  Ngetich, Nelda Bucks, NP  ? ? ?Physical Exam: ?Vitals:  ? 10/03/21 1930 10/03/21 2000 10/03/21 2030 10/03/21 2337  ?BP: 121/76 120/77 114/70 (!) 144/78  ?Pulse: 80 78 78 79  ?Resp: 20 (!) '22 19 16  '$ ?Temp:    98.2 ?F (36.8 ?C)  ?TempSrc:    Oral  ?SpO2: 100% 98% 98% 97%  ?Weight:    49.2 kg  ?Height:      ? ? ?General: Elderly female.  Cachectic ?Awake and alert and oriented x 1 . Not in any acute distress.  ?HEENT: NCAT.  PERRLA. EOMI. Sclerae anicteric. Dry mucosal membranes. ?Neck: Neck supple without lymphadenopathy. No carotid bruits. No masses palpated.  ?Cardiovascular: Regular rate with normal S1-S2 sounds. No murmurs, rubs or gallops auscultated. No JVD.  ?Respiratory: Clear breath sounds.  No accessory muscle use. ?Abdomen: Soft, nontender, nondistended. Active bowel sounds. No masses or hepatosplenomegaly  ?Skin: No rashes, lesions, or ulcerations.  Dry, warm to touch. ?Musculoskeletal:  2+ dorsalis pedis and radial pulses. Good ROM.  No contractures  ?Psychiatric: Intact judgment and insight.  Mood appropriate to current condition. ?Neurologic: No focal neurological deficits. Strength is 5/5 x 4.  CN II - XII grossly intact. ? ?EKG: I independently viewed the EKG done and my findings are as followed: EKG was not done in the ED ? ?Assessment/Plan ?Present on Admission: ? COVID-19 virus infection ? Dementia ([place]) ? Palpitations ? ?Principal Problem: ?  COVID-19 virus infection ?Active  Problems: ?  Palpitations ?  Dementia ([place]) ?  Depression ?  Thrombocytopenia ([place]) ?  Atelectasis ? ? ?COVID-19 virus infection ?Continue paxlovid per pharmacy protocol ?Continue vitamin-C 500 mg p.o. Daily ?Continue zinc 220 mg p.o. Daily ?Continue Robitussin  ?Continue Tylenol p.r.n. for fever ?Patient does not require supplemental oxygen at this time ?Continue airborne isolation precaution ?Continue monitoring daily inflammatory markers ? ?Atelectasis ?Chest x-ray showed  bibasilar subsegmental atelectasis. ?Continue incentive spirometry ? ?Thrombocytopenia possibly reactive ?Platelets 124, no sign of bleeding ?Continue to monitor platelet levels with morning labs ? ?Essential hypertension ?Continue Toprol XL ? ?Dementia ?Patient was not on any medication per med rec ?Vitamin B12 and TSH will be checked to rule out reversible dementia ? ?Insomnia ?Continue  melatonin ? ?Depression ?Continue Zoloft per home regimen ? ? ? ?DVT prophylaxis: Lovenox ? ?Advance care planning: Code Status: DNR ? ?Family Communication: None at bedside ? ?Consults: None ? ?Severity of Illness: ?The appropriate patient status for this patient is OBSERVATION. Observation status is judged to be reasonable and necessary in order to provide the required intensity of service to ensure the patient's safety. The patient's presenting symptoms, physical exam findings, and initial radiographic and laboratory data in the context of their medical condition is felt to place them at decreased risk for further clinical deterioration. Furthermore, it is anticipated that the patient will be medically stable for discharge from the hospital within 2 midnights of admission.  ? ?Author: Bernadette Hoit, DO ?10/04/2021 12:03 AM ? ?For on call review [email or web address].  ?

## 2021-10-03 NOTE — ED Notes (Signed)
Tylenol crushed and given in applesauce ?

## 2021-10-03 NOTE — ED Provider Notes (Signed)
?[facility] ?Provider Note ? ? ?CSN: [id number] ?Arrival date & time: 10/03/21  1443 ? ?  ? ?History ? ?Chief Complaint  ?Patient presents with  ? Fever  ? ? ?Kathleen Gallegos is a 88 y.o. female. ? ?HPI ?Patient is a 88 year old female with a history of dementia, CKD, hyperlipidemia, TIA, who presents to the emergency department due to fevers.  Patient lives with her daughter [name].  She and her daughter both contracted COVID-19.  She has been experiencing a cough, rhinorrhea, as well as weakness for the past 1 to 2 days.  Due to [name]'s diagnosis her sister [name] is accompanying the patient in the ED.  She states that patient has not had anything to eat or drink for the past 24 hours.  She appears much more fatigued than normal.  Typically A&O x1 at baseline.  She states that patient is a DNR.  States that she does not have a home oxygen requirement. ?  ? ?Home Medications ?Prior to Admission medications   ?Medication Sig Start Date End Date Taking? Authorizing Provider  ?cholecalciferol (VITAMIN D) 25 MCG (1000 UNIT) tablet Take 1 tablet (1,000 Units total) by mouth every morning for 14 days. ?Patient taking differently: Take 5,000 Units by mouth every morning. 10/03/21 10/17/21 Yes Ngetich, [name] C, NP  ?CRANBERRY PO Take 25 mg by mouth daily.   Yes [provider]  ?fexofenadine (ALLEGRA) 180 MG tablet Take 180 mg by mouth as needed.   Yes [provider]  ?fluticasone (FLONASE) 50 MCG/ACT nasal spray Place 1 spray into both nostrils as needed.   Yes [provider]  ?Melatonin 10 MG TABS Take 10 mg by mouth daily.   Yes [provider]  ?metoprolol succinate (TOPROL-XL) 25 MG 24 hr tablet Take 1 tablet by mouth once daily 06/20/21  Yes Ngetich, [name] C, NP  ?polyethylene glycol powder (GLYCOLAX/MIRALAX) 17 GM/SCOOP powder Take 17 g by mouth daily. Hold for loose stool 06/16/20  Yes Ngetich, [name] C, NP  ?sertraline (ZOLOFT) 50 MG tablet Take 1 tablet  by mouth once daily 08/29/21  Yes Ngetich, [name] C, NP  ?Ascorbic Acid (VITAMIN C) 1000 MG tablet Take 1 tablet (1,000 mg total) by mouth daily for 14 days. ?Patient not taking: Reported on 10/03/2021 10/03/21 10/17/21  Ngetich, Nelda Bucks, NP  ?molnupiravir EUA (LAGEVRIO) 200 mg CAPS capsule Take 4 capsules (800 mg total) by mouth 2 (two) times daily for 5 days. 10/03/21 10/08/21  Ngetich, [name] C, NP  ?zinc gluconate 50 MG tablet Take 1 tablet (50 mg total) by mouth daily for 14 days. 10/03/21 10/17/21  Ngetich, Nelda Bucks, NP  ?   ? ?Allergies    ?Diphenhydramine hcl and Rivastigmine tartrate   ? ?Review of Systems   ?Review of Systems  ?All other systems reviewed and are negative. ?Ten systems reviewed and are negative for acute change, except as noted in the HPI.   ?Physical Exam ?Updated Vital Signs ?BP 121/76   Pulse 80   Temp 98.3 ?F (36.8 ?C) (Oral)   Resp 20   Ht '5\' 6"'  (1.676 m)   Wt 51.3 kg   SpO2 100%   BMI 18.24 kg/m?  ?Physical Exam ?Vitals and nursing note reviewed.  ?Constitutional:   ?   General: She is not in acute distress. ?   Appearance: She is not ill-appearing, toxic-appearing or diaphoretic.  ?   Comments: Fatigued and cachectic appearing elderly female.  Speaks in 1-2 word phrases.  ?  HENT:  ?   Head: Normocephalic and atraumatic.  ?   Right Ear: External ear normal.  ?   Left Ear: External ear normal.  ?   Nose: Nose normal.  ?   Mouth/Throat:  ?   Mouth: Mucous membranes are dry.  ?   Pharynx: Oropharynx is clear. No oropharyngeal exudate or posterior oropharyngeal erythema.  ?Eyes:  ?   General: No scleral icterus.    ?   Right eye: No discharge.     ?   Left eye: No discharge.  ?   Extraocular Movements: Extraocular movements intact.  ?   Conjunctiva/sclera: Conjunctivae normal.  ?Cardiovascular:  ?   Rate and Rhythm: Regular rhythm. Tachycardia present.  ?   Pulses: Normal pulses.  ?   Heart sounds: Normal heart sounds. No murmur heard. ?  No friction rub. No gallop.  ?Pulmonary:  ?   Effort:  Pulmonary effort is normal. No respiratory distress.  ?   Breath sounds: Normal breath sounds. No stridor. No wheezing, rhonchi or rales.  ?Abdominal:  ?   General: Abdomen is flat.  ?   Palpations: Abdomen is soft.  ?   Tenderness: There is no abdominal tenderness.  ?   Comments: Abdomen is flat and soft.  Patient shows no signs of tenderness with deep palpation  ?Musculoskeletal:     ?   General: Normal range of motion.  ?   Cervical back: Normal range of motion and neck supple. No tenderness.  ?Skin: ?   General: Skin is warm and dry.  ?Neurological:  ?   General: No focal deficit present.  ?   Comments: Cachectic and fatigued appearing.  Patient is able to state her name and give 1-2 word answers.  A&O x1.  ? ? ?ED Results / Procedures / Treatments   ?Labs ?(all labs ordered are listed, but only abnormal results are displayed) ?Labs Reviewed  ?RESP PANEL BY RT-PCR (FLU A&B, COVID) ARPGX2 - Abnormal; Notable for the following components:  ?    Result Value  ? SARS Coronavirus 2 by RT PCR POSITIVE (*)   ? All other components within normal limits  ?CBC WITH DIFFERENTIAL/PLATELET - Abnormal; Notable for the following components:  ? RBC 3.72 (*)   ? Hemoglobin 11.5 (*)   ? Platelets 124 (*)   ? Lymphs Abs 0.6 (*)   ? All other components within normal limits  ?COMPREHENSIVE METABOLIC PANEL - Abnormal; Notable for the following components:  ? Glucose, Bld 102 (*)   ? Creatinine, Ser 1.06 (*)   ? Total Protein 8.2 (*)   ? Alkaline Phosphatase 35 (*)   ? GFR, Estimated 49 (*)   ? All other components within normal limits  ?LACTIC ACID, PLASMA  ?LACTIC ACID, PLASMA  ? ?EKG ?None ? ?Radiology ?DG Chest Portable 1 View ? ?Result Date: 10/03/2021 ?CLINICAL DATA:  weakness; covid EXAM: PORTABLE CHEST 1 VIEW COMPARISON:  Radiograph 05/29/2013 FINDINGS: Cardiomediastinal silhouette is within normal limits in size. Aortic arch calcifications. There is no focal airspace consolidation. Bibasilar subsegmental atelectasis. There is  no large pleural effusion. There is no visible pneumothorax. There are skin folds overlying the chest bilaterally. There is no acute osseous abnormality. IMPRESSION: Bibasilar subsegmental atelectasis. No focal airspace consolidation. Electronically Signed   By: Maurine Simmering M.D.   On: 10/03/2021 15:52   ? ?Procedures ?Procedures  ? ? ?Medications Ordered in ED ?Medications  ?nirmatrelvir/ritonavir EUA (PAXLOVID) 3 tablet (has no administration in time range)  ?  sodium chloride 0.9 % bolus 500 mL (0 mLs Intravenous Stopped 10/03/21 1657)  ?acetaminophen (TYLENOL) tablet 650 mg (650 mg Oral Given 10/03/21 1558)  ? ? ?ED Course/ Medical Decision Making/ A&P ?  ?                        ?Medical Decision Making ?Amount and/or Complexity of Data Reviewed ?Labs: ordered. ?Radiology: ordered. ? ?Risk ?OTC drugs. ?Decision regarding hospitalization. ? ?Pt is a 88 y.o. female who presents to the emergency department with her daughter due to symptoms related to COVID-19. ? ?Labs: ?CBC with RBCs of 3.72, hemoglobin of 11.5, platelets of 124, lymphocytes of 0.6. ?CMP with a glucose of 102, creatinine of 1.06, total protein of 8.2, alk phos of 35, GFR 49. ?Lactic acid of 1. ?Respiratory panel is positive for COVID-19. ? ?Imaging: ?Chest x-ray shows bibasilar subsegmental atelectasis.  No focal airspace consolidation. ? ?I, Rayna Sexton, PA-C, personally reviewed and evaluated these images and lab results as part of my medical decision-making. ? ?Patient's daughter states that she has not had any p.o. intake for the past 24 hours.  Appears weaker and more altered than normal.  Difficulty staying awake.  I obtained a basic work-up as well as a respiratory panel.  Confirmatory testing shows that patient is positive for COVID-19.  Remaining lab work appears similar to the patient's baseline.  Chest x-ray shows bibasilar subsegmental atelectasis without focal airspace consolidation. ? ?Given patient's age, comorbidities, as well as  her current condition she was discussed with and evaluated by my attending physician Dr. Matilde Sprang.  We both feel that patient would benefit from admission for further management.  This was discussed with the medic

## 2021-10-03 NOTE — Telephone Encounter (Signed)
Patient daughter, Kathleen Gallegos called and left message on clinical intake and stated that she feels patient is worse. Stated that patient is Not waking up like she normally does and not eating. Increase Cough.  ?Daughter cannot take care of her because she is also Covid Positive and not feeling well.  ?Patient is now being evaluated at ER.  ?

## 2021-10-04 ENCOUNTER — Observation Stay (HOSPITAL_COMMUNITY): Payer: Medicare Other

## 2021-10-04 ENCOUNTER — Encounter (HOSPITAL_COMMUNITY): Payer: Self-pay | Admitting: Internal Medicine

## 2021-10-04 DIAGNOSIS — F0150 Vascular dementia without behavioral disturbance: Secondary | ICD-10-CM

## 2021-10-04 DIAGNOSIS — J9811 Atelectasis: Secondary | ICD-10-CM

## 2021-10-04 DIAGNOSIS — D696 Thrombocytopenia, unspecified: Secondary | ICD-10-CM

## 2021-10-04 DIAGNOSIS — R0602 Shortness of breath: Secondary | ICD-10-CM | POA: Diagnosis not present

## 2021-10-04 DIAGNOSIS — U071 COVID-19: Secondary | ICD-10-CM | POA: Diagnosis not present

## 2021-10-04 DIAGNOSIS — R002 Palpitations: Secondary | ICD-10-CM | POA: Diagnosis not present

## 2021-10-04 LAB — CBC WITH DIFFERENTIAL/PLATELET
Abs Immature Granulocytes: 0.02 10*3/uL (ref 0.00–0.07)
Basophils Absolute: 0 10*3/uL (ref 0.0–0.1)
Basophils Relative: 1 %
Eosinophils Absolute: 0 10*3/uL (ref 0.0–0.5)
Eosinophils Relative: 1 %
HCT: 37.4 % (ref 36.0–46.0)
Hemoglobin: 11.7 g/dL — ABNORMAL LOW (ref 12.0–15.0)
Immature Granulocytes: 1 %
Lymphocytes Relative: 22 %
Lymphs Abs: 0.8 10*3/uL (ref 0.7–4.0)
MCH: 31 pg (ref 26.0–34.0)
MCHC: 31.3 g/dL (ref 30.0–36.0)
MCV: 99.2 fL (ref 80.0–100.0)
Monocytes Absolute: 0.7 10*3/uL (ref 0.1–1.0)
Monocytes Relative: 21 %
Neutro Abs: 1.9 10*3/uL (ref 1.7–7.7)
Neutrophils Relative %: 54 %
Platelets: 102 10*3/uL — ABNORMAL LOW (ref 150–400)
RBC: 3.77 MIL/uL — ABNORMAL LOW (ref 3.87–5.11)
RDW: 13.1 % (ref 11.5–15.5)
WBC: 3.5 10*3/uL — ABNORMAL LOW (ref 4.0–10.5)
nRBC: 0 % (ref 0.0–0.2)

## 2021-10-04 LAB — COMPREHENSIVE METABOLIC PANEL
ALT: 12 U/L (ref 0–44)
AST: 23 U/L (ref 15–41)
Albumin: 3.6 g/dL (ref 3.5–5.0)
Alkaline Phosphatase: 33 U/L — ABNORMAL LOW (ref 38–126)
Anion gap: 6 (ref 5–15)
BUN: 19 mg/dL (ref 8–23)
CO2: 23 mmol/L (ref 22–32)
Calcium: 8.8 mg/dL — ABNORMAL LOW (ref 8.9–10.3)
Chloride: 112 mmol/L — ABNORMAL HIGH (ref 98–111)
Creatinine, Ser: 0.83 mg/dL (ref 0.44–1.00)
GFR, Estimated: >60 mL/min (ref >60)
Glucose, Bld: 78 mg/dL (ref 70–99)
Potassium: 3.9 mmol/L (ref 3.5–5.1)
Sodium: 141 mmol/L (ref 135–145)
Total Bilirubin: 0.7 mg/dL (ref 0.3–1.2)
Total Protein: 8.1 g/dL (ref 6.5–8.1)

## 2021-10-04 LAB — PHOSPHORUS: Phosphorus: 3.2 mg/dL (ref 2.5–4.6)

## 2021-10-04 LAB — D-DIMER, QUANTITATIVE: D-Dimer, Quant: 15.56 ug/mL-FEU — ABNORMAL HIGH (ref 0.00–0.50)

## 2021-10-04 LAB — MAGNESIUM: Magnesium: 2.2 mg/dL (ref 1.7–2.4)

## 2021-10-04 LAB — FERRITIN: Ferritin: 496 ng/mL — ABNORMAL HIGH (ref 11–307)

## 2021-10-04 LAB — C-REACTIVE PROTEIN: CRP: 2.3 mg/dL — ABNORMAL HIGH (ref <1.0)

## 2021-10-04 LAB — TSH: TSH: 3.031 u[IU]/mL (ref 0.350–4.500)

## 2021-10-04 LAB — VITAMIN B12: Vitamin B-12: 784 pg/mL (ref 180–914)

## 2021-10-04 MED ORDER — SERTRALINE HCL 50 MG PO TABS
50.0000 mg | ORAL_TABLET | Freq: Every day | ORAL | Status: DC
  Administered 2021-10-04 – 2021-10-05 (×2): 50 mg via ORAL
  Filled 2021-10-04 (×2): qty 1

## 2021-10-04 MED ORDER — TECHNETIUM TO 99M ALBUMIN AGGREGATED
4.0000 | Freq: Once | INTRAVENOUS | Status: AC | PRN
  Administered 2021-10-04: 4.4 via INTRAVENOUS

## 2021-10-04 MED ORDER — METOPROLOL SUCCINATE ER 25 MG PO TB24
25.0000 mg | ORAL_TABLET | Freq: Every day | ORAL | Status: DC
  Administered 2021-10-04 – 2021-10-05 (×2): 25 mg via ORAL
  Filled 2021-10-04 (×2): qty 1

## 2021-10-04 NOTE — Assessment & Plan Note (Addendum)
--  Pt remains on room air. ?--very high d dimer related to Covid infection and is trending down   ?--VQ scan was negative for PE. ?--venous US legs negative for DVT ?--complete last remaining doses of paxlovid ?

## 2021-10-04 NOTE — Progress Notes (Signed)
?PROGRESS NOTE ? ? ?Kathleen Gallegos  OZH:[id number] DOB: 08-26-1925 DOA: 10/03/2021 ?PCP: Ngetich, Nelda Bucks, NP  ? ?Chief Complaint  ?Patient presents with  ? Fever  ? ?Level of care: Med-Surg ? ?Brief Admission History:  ? 88 y.o. female with medical history significant of dementia with behavioral ?disturbance, hypertension who presents to the emergency department accompanied by her daughter [name]) due to concern for fever.  Patient was unable to provide history possibly due to underlying dementia, history was obtained from ED physician and ED medical record.  Per report, she lives with her daughter [name]) and both of them contracted COVID-19 virus infection, patient has been having runny nose, cough, weakness since last 1 to 2 days and she was reported to be weaker. ?  ?ED course: ?In the emergency department, patient was hemodynamically stable, vital signs were normal and O2 sat was 92-100% on room air.  Work-up in the ED showed normal CBC except for thrombocytopenia, BMP was normal except for slight elevation in creatinine at 1.06 and CBG of 102.  Influenza A, B was negative.  SARS coronavirus 2 was positive.  Chest x-ray showed bibasilar subsegmental atelectasis. No focal airspace consolidation.  She was started on Tylenol, Paxlovid and IV hydration.  Hospitalist was asked to admit patient for further evaluation and management.  ? ?10/04/2021:  Pt remains pleasantly confused.  She is not having any shortness of breath and is not on oxygen and stable on room air.   Awaiting for a PT evaluation.  ?  ?Assessment and Plan: ?* COVID-19 virus infection ?--Pt remains on room air. ?--very high d dimer likely related to Covid infection.   ?--VQ scan was negative for PE. ?--checking venous US legs to rule out DVT ? ?Atelectasis ?-encouraging incentive spirometry  ? ?Thrombocytopenia ([place]) ?--stable, no active bleeding.  Likely related to advanced age and comorbidities.  ? ?Depression ?--currently stable.   ? ?Dementia  ([place]) ?--delirium precautions.  ? ?Palpitations ?--stable.  No current symptoms.   ? ? ?DVT prophylaxis: enoxaparin ?Code Status: DNR  ?Family Communication:  ?Disposition: Status is: Observation ?The patient remains OBS appropriate and will d/c before 2 midnights. (Currently waiting on PT eval for disposition) Unsafe discharge due to daughter at home sick with Covid and cannot care for patient) ?  ?Consultants:  ? ?Procedures:  ? ?Antimicrobials:  ?  ?Subjective: ?Awake, alert, no specific complaints.  ?Objective: ?Vitals:  ? 10/04/21 0325 10/04/21 6295 10/04/21 0933 10/04/21 1138  ?BP: (!) 154/96 (!) 152/90 (!) 142/89 (!) 136/91  ?Pulse: 85 82 83 72  ?Resp: '16 17 20 18  '$ ?Temp: 98.5 ?F (36.9 ?C)   (!) 97.3 ?F (36.3 ?C)  ?TempSrc: Oral   Oral  ?SpO2: 99% 99% 99% 100%  ?Weight:      ?Height:      ? ? ?Intake/Output Summary (Last 24 hours) at 10/04/2021 1442 ?Last data filed at 10/04/2021 1300 ?Gross per 24 hour  ?Intake 740 ml  ?Output --  ?Net 740 ml  ? ?Filed Weights  ? 10/03/21 1455 10/03/21 2337  ?Weight: 51.3 kg 49.2 kg  ? ?Examination: ? ?General exam: elderly female, pleasantly demented, Appears calm and comfortable  ?Respiratory system: Clear to auscultation. Respiratory effort normal. ?Cardiovascular system: normal S1 & S2 heard. No JVD, murmurs, rubs, gallops or clicks. No pedal edema. ?Gastrointestinal system: Abdomen is nondistended, soft and nontender. No organomegaly or masses felt. Normal bowel sounds heard. ?Central nervous system: Alert and oriented. No focal neurological deficits. ?Extremities: Symmetric 5  x 5 power. ?Skin: No rashes, lesions or ulcers. ?Psychiatry: Judgement and insight appear poor with dementia. Mood & affect appropriate.  ? ?Data Reviewed: I have personally reviewed following labs and imaging studies ? ?CBC: ?Recent Labs  ?Lab 10/03/21 ?1527 10/04/21 ?4174  ?WBC 4.5 3.5*  ?NEUTROABS 3.2 1.9  ?HGB 11.5* 11.7*  ?HCT 36.1 37.4  ?MCV 97.0 99.2  ?PLT 124* 102*  ? ? ?Basic Metabolic  Panel: ?Recent Labs  ?Lab 10/03/21 ?1527 10/04/21 ?0814  ?NA 139 141  ?K 3.7 3.9  ?CL 104 112*  ?CO2 25 23  ?GLUCOSE 102* 78  ?BUN 19 19  ?CREATININE 1.06* 0.83  ?CALCIUM 9.1 8.8*  ?MG  --  2.2  ?PHOS  --  3.2  ? ? ?CBG: ?No results for input(s): GLUCAP in the last 168 hours. ? ?Recent Results (from the past 240 hour(s))  ?Resp Panel by RT-PCR (Flu A&B, Covid) Nasopharyngeal Swab     Status: Abnormal  ? Collection Time: 10/03/21  2:59 PM  ? Specimen: Nasopharyngeal Swab; Nasopharyngeal(NP) swabs in vial transport medium  ?Result Value Ref Range Status  ? SARS Coronavirus 2 by RT PCR POSITIVE (A) NEGATIVE Final  ?  Comment: (NOTE) ?SARS-CoV-2 target nucleic acids are DETECTED. ? ?The SARS-CoV-2 RNA is generally detectable in upper respiratory ?specimens during the acute phase of infection. Positive results are ?indicative of the presence of the identified virus, but do not rule ?out bacterial infection or co-infection with other pathogens not ?detected by the test. Clinical correlation with patient history and ?other diagnostic information is necessary to determine patient ?infection status. The expected result is Negative. ? ?Fact Sheet for Patients: ?EntrepreneurPulse.com.au ? ?Fact Sheet for Healthcare Providers: ?IncredibleEmployment.be ? ?This test is not yet approved or cleared by the [place] FDA and  ?has been authorized for detection and/or diagnosis of SARS-CoV-2 by ?FDA under an Emergency Use Authorization (EUA).  This EUA will ?remain in effect (meaning this test can be used) for the duration of  ?the COVID-19 declaration under Section 564(b)(1) of the A ct, 21 ?U.S.C. section 360bbb-3(b)(1), unless the authorization is ?terminated or revoked sooner. ? ?  ? Influenza A by PCR NEGATIVE NEGATIVE Final  ? Influenza B by PCR NEGATIVE NEGATIVE Final  ?  Comment: (NOTE) ?The Xpert Xpress SARS-CoV-2/FLU/RSV plus assay is intended as an aid ?in the diagnosis of influenza  from Nasopharyngeal swab specimens and ?should not be used as a sole basis for treatment. Nasal washings and ?aspirates are unacceptable for Xpert Xpress SARS-CoV-2/FLU/RSV ?testing. ? ?Fact Sheet for Patients: ?EntrepreneurPulse.com.au ? ?Fact Sheet for Healthcare Providers: ?IncredibleEmployment.be ? ?This test is not yet approved or cleared by the [place] FDA and ?has been authorized for detection and/or diagnosis of SARS-CoV-2 by ?FDA under an Emergency Use Authorization (EUA). This EUA will remain ?in effect (meaning this test can be used) for the duration of the ?COVID-19 declaration under Section 564(b)(1) of the Act, 21 U.S.C. ?section 360bbb-3(b)(1), unless the authorization is terminated or ?revoked. ? ?Performed at [facility], [address] [place], [place] [address] ?  ?  ? ?Radiology Studies: ?NM Pulmonary Perfusion ? ?Result Date: 10/04/2021 ?CLINICAL DATA:  PE suspected, elevated D-dimer, shortness of breath EXAM: NUCLEAR MEDICINE PERFUSION LUNG SCAN TECHNIQUE: Perfusion images were obtained in multiple projections after intravenous injection of radiopharmaceutical. Ventilation scans intentionally deferred if perfusion scan and chest x-ray adequate for interpretation during COVID 19 epidemic. RADIOPHARMACEUTICALS:  4.4 mCi Tc-23mMAA IV COMPARISON:  Chest radiograph, 10/03/2021 FINDINGS: Normal,  homogeneous perfusion of the bilateral lungs. No suspicious perfusion defects. IMPRESSION: Very low probability for pulmonary embolism by modified perfusion only PIOPED criteria (PE absent). Electronically Signed   By: Delanna Ahmadi M.D.   On: 10/04/2021 11:43  ? ?DG Chest Portable 1 View ? ?Result Date: 10/03/2021 ?CLINICAL DATA:  weakness; covid EXAM: PORTABLE CHEST 1 VIEW COMPARISON:  Radiograph 05/29/2013 FINDINGS: Cardiomediastinal silhouette is within normal limits in size. Aortic arch calcifications. There is no focal airspace consolidation. Bibasilar  subsegmental atelectasis. There is no large pleural effusion. There is no visible pneumothorax. There are skin folds overlying the chest bilaterally. There is no acute osseous abnormality. IMPRESSION: Bibasilar subs

## 2021-10-04 NOTE — Assessment & Plan Note (Signed)
--  stable, no active bleeding.  Likely related to advanced age and comorbidities.  ?

## 2021-10-04 NOTE — Assessment & Plan Note (Signed)
--   currently stable  ?

## 2021-10-04 NOTE — Assessment & Plan Note (Addendum)
--  delirium precautions.  ?--SNF placement in progress. ?

## 2021-10-04 NOTE — Progress Notes (Signed)
This nurse took over pt care at 1530. Pt had not urinated all shift. Bladder scan completed showed 300+ ml. In and out Cath completed 258m of yellow urine was noted. Peri care completed before and after. [place] is now in place and bed is at lowest setting with bed alarm on. Call bell within reach. ? ?

## 2021-10-04 NOTE — Assessment & Plan Note (Signed)
--  stable.  No current symptoms.   ?

## 2021-10-04 NOTE — Hospital Course (Addendum)
88 y.o. female with medical history significant of dementia with behavioral ?disturbance, hypertension who presents to the emergency department accompanied by her daughter [name]) due to concern for fever.  Patient was unable to provide history possibly due to underlying dementia, history was obtained from ED physician and ED medical record.  Per report, she lives with her daughter [name]) and both of them contracted COVID-19 virus infection, patient has been having runny nose, cough, weakness since last 1 to 2 days and she was reported to be weaker. ?  ?ED course: ?In the emergency department, patient was hemodynamically stable, vital signs were normal and O2 sat was 92-100% on room air.  Work-up in the ED showed normal CBC except for thrombocytopenia, BMP was normal except for slight elevation in creatinine at 1.06 and CBG of 102.  Influenza A, B was negative.  SARS coronavirus 2 was positive.  Chest x-ray showed bibasilar subsegmental atelectasis. No focal airspace consolidation.  She was started on Tylenol, Paxlovid and IV hydration.  Hospitalist was asked to admit patient for further evaluation and management.  ? ?10/04/2021:  Pt remains pleasantly confused.  She is not having any shortness of breath and is not on oxygen and stable on room air.   Awaiting for a PT evaluation.  ? ?10/05/2021:  PT recommending SNF. DC to SNF.  Otherwise patient remains clinically stable on room air, pleasantly confused with dementia.  ?

## 2021-10-04 NOTE — Assessment & Plan Note (Signed)
-  encouraging incentive spirometry  ?

## 2021-10-04 NOTE — Progress Notes (Signed)
Patient has not voided since arrival from ED at 2330. Bladder scan revealed >413m retention. AJosephine Cables MD ordered x1 I/O Cath.  ?Patient tolerated procedure well, int catheterization yielding 6737mof dark yellow urine. ?

## 2021-10-04 NOTE — Progress Notes (Signed)
PT Cancellation Note ? ?Patient Details ?Name: Kathleen Gallegos ?MRN: [id number] ?DOB: Jan 31, 1926 ? ? ?Cancelled Treatment:    Reason Eval/Treat Not Completed: Patient at procedure or test/unavailable; Attempted PT eval but patient was having testing done. Will check back if time permits. ? ? ? ?2:55 PM, 10/04/21 ?Mearl Latin PT, DPT ?Physical Therapist at [facility] ?[facility] ? ?

## 2021-10-04 NOTE — TOC Initial Note (Signed)
Transition of Care (TOC) - Initial/Assessment Note  ? ? ?Patient Details  ?Name: Kathleen Gallegos ?MRN: [id number] ?Date of Birth: 07-01-1926 ? ?Transition of Care (TOC) CM/SW Contact:    ?Hughey Rittenberry D, LCSW ?Phone Number: ?10/04/2021, 1:43 PM ? ?Clinical Narrative:                 ?Patient from home with daughter. Admitted COVID positive. At baseline, lives with daughter, [name]. Is active with [facility]. Had an evaluation on Monday with [facility] for Hospice. [name] indicates that patient's care was starting to become more difficult for her and her sister, [name] and brother-in-law, Zenia Resides, had discussed providing increased assistance due to increased needs of patients. [name] is also diagnosed with COVID and states she will need additional help if patient does not go to rehab. Information provided to both [name] and Zenia Resides at [employer] request. ?Discussed PT pending evaluation. Discussed options for SNF taking COVID+ patients.  ? ?Expected Discharge Plan: [place] ?Barriers to Discharge: Continued Medical Work up ? ? ?Patient Goals and CMS Choice ?Patient states their goals for this hospitalization and ongoing recovery are:: rehab ?  ?  ? ?Expected Discharge Plan and Services ?Expected Discharge Plan: [place] ?  ?  ?Post Acute Care Choice: [place] ?Living arrangements for the past 2 months: [place] ?                ?  ?  ?  ?  ?  ?  ?  ?  ?  ?  ? ?Prior Living Arrangements/Services ?Living arrangements for the past 2 months: [place] ?Lives with:: Adult Children ?Patient language and need for interpreter reviewed:: Yes ?Do you feel safe going back to the place where you live?: Yes      ?Need for Family Participation in Patient Care: Yes (Comment) ?Care giver support system in place?: Yes (comment) ?  ?Criminal Activity/Legal Involvement Pertinent to Current Situation/Hospitalization: No - Comment as needed ? ?Activities of  Daily Living ?  ?  ? ?Permission Sought/Granted ?Permission sought to share information with : Family Supports ?  ? Share Information with NAME: daughter, [name]; son-in-law Zenia Resides ?   ?   ?   ? ?Emotional Assessment ?  ?  ?  ?  ?Alcohol / Substance Use: Not Applicable ?Psych Involvement: No (comment) ? ?Admission diagnosis:  COVID-19 virus infection [U07.1] ?COVID-19 [U07.1] ?Patient Active Problem List  ? Diagnosis Date Noted  ? Thrombocytopenia ([place]) 10/04/2021  ? Atelectasis 10/04/2021  ? COVID-19 virus infection 10/03/2021  ? Depression 10/03/2021  ? Benign hypertension with chronic kidney disease, stage III ([place]) 02/22/2021  ? Chronic kidney disease, stage 3a ([place]) 02/22/2021  ? Anemia due to stage 3a chronic kidney disease ([place]) 02/22/2021  ? Unspecified symptoms and signs involving cognitive functions and awareness 12/24/2016  ? Vitamin D deficiency 03/12/2015  ? Dementia ([place]) 01/04/2015  ? Osteoporosis 08/05/2014  ? Medicare annual wellness visit, subsequent 04/07/2014  ? Cough variant asthma 07/28/2013  ? Dry skin dermatitis 02/15/2013  ? HEMORRHOIDS, WITH BLEEDING 07/27/2009  ? TRANSIENT ISCHEMIC ATTACK 03/29/2009  ? Palpitations 03/25/2009  ? IRRITABLE BOWEL SYNDROME 10/05/2008  ? FECAL INCONTINENCE 10/05/2008  ? GERD 05/24/2008  ? Hyperlipemia 11/10/2007  ? Osteoarthritis 11/10/2007  ? Allergic rhinitis due to pollen 09/18/2007  ? LUNG NODULE 09/18/2007  ? ?PCP:  Ngetich, Nelda Bucks, NP ?Pharmacy:   ?[facility], [place] [address] ?[address] ?[place] [address] ?Phone: [phone] Fax: [phone] ? ? ? ? ?Social Determinants of Health (SDOH) Interventions ?  ? ?Readmission Risk Interventions ?   ? View : No data to display.  ?  ?  ?  ? ? ? ?

## 2021-10-05 DIAGNOSIS — E559 Vitamin D deficiency, unspecified: Secondary | ICD-10-CM | POA: Diagnosis not present

## 2021-10-05 DIAGNOSIS — G308 Other Alzheimer's disease: Secondary | ICD-10-CM | POA: Diagnosis not present

## 2021-10-05 DIAGNOSIS — R262 Difficulty in walking, not elsewhere classified: Secondary | ICD-10-CM | POA: Diagnosis not present

## 2021-10-05 DIAGNOSIS — R269 Unspecified abnormalities of gait and mobility: Secondary | ICD-10-CM | POA: Diagnosis not present

## 2021-10-05 DIAGNOSIS — D696 Thrombocytopenia, unspecified: Secondary | ICD-10-CM | POA: Diagnosis not present

## 2021-10-05 DIAGNOSIS — J45909 Unspecified asthma, uncomplicated: Secondary | ICD-10-CM | POA: Diagnosis not present

## 2021-10-05 DIAGNOSIS — F0280 Dementia in other diseases classified elsewhere without behavioral disturbance: Secondary | ICD-10-CM | POA: Diagnosis not present

## 2021-10-05 DIAGNOSIS — R634 Abnormal weight loss: Secondary | ICD-10-CM | POA: Diagnosis not present

## 2021-10-05 DIAGNOSIS — F0150 Vascular dementia without behavioral disturbance: Secondary | ICD-10-CM | POA: Diagnosis not present

## 2021-10-05 DIAGNOSIS — M6281 Muscle weakness (generalized): Secondary | ICD-10-CM | POA: Diagnosis not present

## 2021-10-05 DIAGNOSIS — Z20822 Contact with and (suspected) exposure to covid-19: Secondary | ICD-10-CM | POA: Diagnosis not present

## 2021-10-05 DIAGNOSIS — R000 Tachycardia, unspecified: Secondary | ICD-10-CM | POA: Diagnosis not present

## 2021-10-05 DIAGNOSIS — R2689 Other abnormalities of gait and mobility: Secondary | ICD-10-CM | POA: Diagnosis not present

## 2021-10-05 DIAGNOSIS — I10 Essential (primary) hypertension: Secondary | ICD-10-CM | POA: Diagnosis not present

## 2021-10-05 DIAGNOSIS — R059 Cough, unspecified: Secondary | ICD-10-CM | POA: Diagnosis not present

## 2021-10-05 DIAGNOSIS — Z79899 Other long term (current) drug therapy: Secondary | ICD-10-CM | POA: Diagnosis not present

## 2021-10-05 DIAGNOSIS — R279 Unspecified lack of coordination: Secondary | ICD-10-CM | POA: Diagnosis not present

## 2021-10-05 DIAGNOSIS — Z515 Encounter for palliative care: Secondary | ICD-10-CM | POA: Diagnosis not present

## 2021-10-05 DIAGNOSIS — F02811 Dementia in other diseases classified elsewhere, unspecified severity, with agitation: Secondary | ICD-10-CM | POA: Diagnosis not present

## 2021-10-05 DIAGNOSIS — E785 Hyperlipidemia, unspecified: Secondary | ICD-10-CM | POA: Diagnosis not present

## 2021-10-05 DIAGNOSIS — F0390 Unspecified dementia without behavioral disturbance: Secondary | ICD-10-CM | POA: Diagnosis not present

## 2021-10-05 DIAGNOSIS — J9811 Atelectasis: Secondary | ICD-10-CM | POA: Diagnosis not present

## 2021-10-05 DIAGNOSIS — U071 COVID-19: Secondary | ICD-10-CM | POA: Diagnosis not present

## 2021-10-05 DIAGNOSIS — G4700 Insomnia, unspecified: Secondary | ICD-10-CM | POA: Diagnosis not present

## 2021-10-05 DIAGNOSIS — Z7401 Bed confinement status: Secondary | ICD-10-CM | POA: Diagnosis not present

## 2021-10-05 DIAGNOSIS — F32A Depression, unspecified: Secondary | ICD-10-CM | POA: Diagnosis not present

## 2021-10-05 DIAGNOSIS — F339 Major depressive disorder, recurrent, unspecified: Secondary | ICD-10-CM | POA: Diagnosis not present

## 2021-10-05 DIAGNOSIS — F0283 Dementia in other diseases classified elsewhere, unspecified severity, with mood disturbance: Secondary | ICD-10-CM | POA: Diagnosis not present

## 2021-10-05 DIAGNOSIS — J302 Other seasonal allergic rhinitis: Secondary | ICD-10-CM | POA: Diagnosis not present

## 2021-10-05 DIAGNOSIS — G309 Alzheimer's disease, unspecified: Secondary | ICD-10-CM | POA: Diagnosis not present

## 2021-10-05 DIAGNOSIS — Z8616 Personal history of COVID-19: Secondary | ICD-10-CM | POA: Diagnosis not present

## 2021-10-05 DIAGNOSIS — N183 Chronic kidney disease, stage 3 unspecified: Secondary | ICD-10-CM | POA: Diagnosis not present

## 2021-10-05 LAB — CBC WITH DIFFERENTIAL/PLATELET
Abs Immature Granulocytes: 0 10*3/uL (ref 0.00–0.07)
Basophils Absolute: 0 10*3/uL (ref 0.0–0.1)
Basophils Relative: 1 %
Eosinophils Absolute: 0.1 10*3/uL (ref 0.0–0.5)
Eosinophils Relative: 2 %
HCT: 34.2 % — ABNORMAL LOW (ref 36.0–46.0)
Hemoglobin: 11.2 g/dL — ABNORMAL LOW (ref 12.0–15.0)
Immature Granulocytes: 0 %
Lymphocytes Relative: 32 %
Lymphs Abs: 1 10*3/uL (ref 0.7–4.0)
MCH: 31.6 pg (ref 26.0–34.0)
MCHC: 32.7 g/dL (ref 30.0–36.0)
MCV: 96.6 fL (ref 80.0–100.0)
Monocytes Absolute: 0.7 10*3/uL (ref 0.1–1.0)
Monocytes Relative: 22 %
Neutro Abs: 1.3 10*3/uL — ABNORMAL LOW (ref 1.7–7.7)
Neutrophils Relative %: 43 %
Platelets: 132 10*3/uL — ABNORMAL LOW (ref 150–400)
RBC: 3.54 MIL/uL — ABNORMAL LOW (ref 3.87–5.11)
RDW: 12.7 % (ref 11.5–15.5)
WBC: 3 10*3/uL — ABNORMAL LOW (ref 4.0–10.5)
nRBC: 0 % (ref 0.0–0.2)

## 2021-10-05 LAB — COMPREHENSIVE METABOLIC PANEL
ALT: 18 U/L (ref 0–44)
AST: 29 U/L (ref 15–41)
Albumin: 3.4 g/dL — ABNORMAL LOW (ref 3.5–5.0)
Alkaline Phosphatase: 31 U/L — ABNORMAL LOW (ref 38–126)
Anion gap: 7 (ref 5–15)
BUN: 21 mg/dL (ref 8–23)
CO2: 23 mmol/L (ref 22–32)
Calcium: 8.7 mg/dL — ABNORMAL LOW (ref 8.9–10.3)
Chloride: 105 mmol/L (ref 98–111)
Creatinine, Ser: 0.87 mg/dL (ref 0.44–1.00)
GFR, Estimated: >60 mL/min (ref >60)
Glucose, Bld: 97 mg/dL (ref 70–99)
Potassium: 3.7 mmol/L (ref 3.5–5.1)
Sodium: 135 mmol/L (ref 135–145)
Total Bilirubin: 0.8 mg/dL (ref 0.3–1.2)
Total Protein: 7.6 g/dL (ref 6.5–8.1)

## 2021-10-05 LAB — FERRITIN: Ferritin: 558 ng/mL — ABNORMAL HIGH (ref 11–307)

## 2021-10-05 LAB — PHOSPHORUS: Phosphorus: 2.7 mg/dL (ref 2.5–4.6)

## 2021-10-05 LAB — MAGNESIUM: Magnesium: 1.9 mg/dL (ref 1.7–2.4)

## 2021-10-05 LAB — D-DIMER, QUANTITATIVE: D-Dimer, Quant: 4.21 ug/mL-FEU — ABNORMAL HIGH (ref 0.00–0.50)

## 2021-10-05 LAB — C-REACTIVE PROTEIN: CRP: 1.3 mg/dL — ABNORMAL HIGH (ref <1.0)

## 2021-10-05 MED ORDER — ACETAMINOPHEN 325 MG PO TABS: 650.0000 mg | ORAL_TABLET | Freq: Four times a day (QID) | ORAL | Status: AC | PRN

## 2021-10-05 MED ORDER — ASCORBIC ACID 500 MG PO TABS: 500.0000 mg | ORAL_TABLET | Freq: Every day | ORAL | 0 refills | Status: AC

## 2021-10-05 MED ORDER — ZINC SULFATE 220 (50 ZN) MG PO CAPS: 220.0000 mg | ORAL_CAPSULE | Freq: Every day | ORAL | 0 refills | Status: AC

## 2021-10-05 MED ORDER — NIRMATRELVIR/RITONAVIR (PAXLOVID) TABLET (RENAL DOSING): 2.0000 | ORAL_TABLET | Freq: Two times a day (BID) | ORAL | 0 refills | Status: AC

## 2021-10-05 MED ORDER — SENNOSIDES-DOCUSATE SODIUM 8.6-50 MG PO TABS
1.0000 | ORAL_TABLET | Freq: Two times a day (BID) | ORAL | Status: DC
Start: 2021-10-05 — End: 2021-10-05
  Administered 2021-10-05: 1 via ORAL
  Filled 2021-10-05: qty 1

## 2021-10-05 MED ORDER — BISACODYL 10 MG RE SUPP
10.0000 mg | Freq: Once | RECTAL | Status: AC
  Administered 2021-10-05: 10 mg via RECTAL
  Filled 2021-10-05: qty 1

## 2021-10-05 MED ORDER — POLYETHYLENE GLYCOL 3350 17 G PO PACK
17.0000 g | PACK | Freq: Two times a day (BID) | ORAL | Status: DC
  Administered 2021-10-05: 17 g via ORAL
  Filled 2021-10-05 (×2): qty 1

## 2021-10-05 MED ORDER — FEXOFENADINE HCL 180 MG PO TABS: 90.0000 mg | ORAL_TABLET | ORAL | Status: AC | PRN

## 2021-10-05 NOTE — Progress Notes (Signed)
Patient bladder scan revealed 210cc .  ?

## 2021-10-05 NOTE — Plan of Care (Signed)
?  Problem: Respiratory: ?Goal: Will maintain a patent airway ?Outcome: Progressing ?Goal: Complications related to the disease process, condition or treatment will be avoided or minimized ?Outcome: Progressing ?  ?Problem: Clinical Measurements: ?Goal: Will remain free from infection ?Outcome: Progressing ?Goal: Diagnostic test results will improve ?Outcome: Progressing ?Goal: Respiratory complications will improve ?Outcome: Progressing ?Goal: Cardiovascular complication will be avoided ?Outcome: Progressing ?  ?Problem: Coping: ?Goal: Level of anxiety will decrease ?Outcome: Progressing ?  ?Problem: Pain Managment: ?Goal: General experience of comfort will improve ?Outcome: Progressing ?  ?Problem: Safety: ?Goal: Ability to remain free from injury will improve ?Outcome: Progressing ?  ?Problem: Coping: ?Goal: Psychosocial and spiritual needs will be supported ?Outcome: Not Progressing ?  ?Problem: Education: ?Goal: Knowledge of General Education information will improve ?Description: Including pain rating scale, medication(s)/side effects and non-pharmacologic comfort measures ?Outcome: Not Progressing ?  ?Problem: Health Behavior/Discharge Planning: ?Goal: Ability to manage health-related needs will improve ?Outcome: Not Progressing ?  ?Problem: Clinical Measurements: ?Goal: Ability to maintain clinical measurements within normal limits will improve ?Outcome: Not Progressing ?  ?Problem: Activity: ?Goal: Risk for activity intolerance will decrease ?Outcome: Not Progressing ?  ?Problem: Nutrition: ?Goal: Adequate nutrition will be maintained ?Outcome: Not Progressing ?  ?Problem: Elimination: ?Goal: Will not experience complications related to bowel motility ?Outcome: Not Progressing ?Goal: Will not experience complications related to urinary retention ?Outcome: Not Progressing ?  ?Problem: Skin Integrity: ?Goal: Risk for impaired skin integrity will decrease ?Outcome: Not Progressing ?  ?

## 2021-10-05 NOTE — Care Management Obs Status (Signed)
MEDICARE OBSERVATION STATUS NOTIFICATION ? ? ?Patient Details  ?Name: Kathleen Gallegos ?MRN: [id number] ?Date of Birth: 1926-01-29 ? ? ?Medicare Observation Status Notification Given:  Yes ? ? ? ?Tommy Medal ?10/05/2021, 9:27 AM ?

## 2021-10-05 NOTE — Progress Notes (Signed)
Attempted to call patient's daughter, [name], concerning patient's discharge ?

## 2021-10-05 NOTE — NC FL2 (Addendum)
?[place] MEDICAID FL2 LEVEL OF CARE SCREENING TOOL  ?  ? ?IDENTIFICATION  ?Patient Name: ?Kathleen Gallegos Birthdate: 1925-08-30 Sex: female Admission Date (Current Location): ?10/03/2021  ?[place] and [place] Number: ? [place] and Address:  ?[facility] [address] [place] ?     Provider Number: ?[id number]  ?Attending Physician Name and Address:  ?Murlean Iba, MD ? Relative Name and Phone Number:  ?Shamariah, Shewmake (Daughter)   [phone] ?   ?Current Level of Care: ?Hospital (observation) Recommended Level of Care: ?[place] Prior Approval Number: ?  ? ?Date Approved/Denied: ?  PASRR Number: ? [id number] E  ?10/05/2021 11/04/2021 ? ?Discharge Plan: ?SNF ?  ? ?Current Diagnoses: ?Patient Active Problem List  ? Diagnosis Date Noted  ? Thrombocytopenia ([place]) 10/04/2021  ? Atelectasis 10/04/2021  ? COVID-19 virus infection 10/03/2021  ? Depression 10/03/2021  ? Benign hypertension with chronic kidney disease, stage III ([place]) 02/22/2021  ? Chronic kidney disease, stage 3a ([place]) 02/22/2021  ? Anemia due to stage 3a chronic kidney disease ([place]) 02/22/2021  ? Unspecified symptoms and signs involving cognitive functions and awareness 12/24/2016  ? Vitamin D deficiency 03/12/2015  ? Dementia ([place]) 01/04/2015  ? Osteoporosis 08/05/2014  ? Medicare annual wellness visit, subsequent 04/07/2014  ? Cough variant asthma 07/28/2013  ? Dry skin dermatitis 02/15/2013  ? HEMORRHOIDS, WITH BLEEDING 07/27/2009  ? TRANSIENT ISCHEMIC ATTACK 03/29/2009  ? Palpitations 03/25/2009  ? IRRITABLE BOWEL SYNDROME 10/05/2008  ? FECAL INCONTINENCE 10/05/2008  ? GERD 05/24/2008  ? Hyperlipemia 11/10/2007  ? Osteoarthritis 11/10/2007  ? Allergic rhinitis due to pollen 09/18/2007  ? LUNG NODULE 09/18/2007  ? ? ?Orientation RESPIRATION BLADDER Height & Weight   ?  ?Self ? Normal Incontinent, Continent Weight: 108 lb 7.5 oz (49.2 kg) ?Height:  '5\' 6"'$  (167.6 cm)  ?BEHAVIORAL  SYMPTOMS/MOOD NEUROLOGICAL BOWEL NUTRITION STATUS  ?    Incontinent, Continent Diet (heart healthy)  ?AMBULATORY STATUS COMMUNICATION OF NEEDS Skin   ?Limited Assist Verbally Normal ?  ?  ?  ?    ?     ?     ? ? ?Personal Care Assistance Level of Assistance  ?Bathing, Feeding, Dressing Bathing Assistance: Limited assistance ?Feeding assistance: Limited assistance ?Dressing Assistance: Limited assistance ?   ? ?Functional Limitations Info  ?Sight, Hearing, Speech Sight Info: Adequate ?Hearing Info: Adequate ?Speech Info: Adequate  ? ? ?SPECIAL CARE FACTORS FREQUENCY  ?PT (By licensed PT)   ?  ?PT Frequency: 5x/week ?  ?  ?  ?  ?   ? ? ?Contractures Contractures Info: Not present  ? ? ?Additional Factors Info  ?Code Status, Allergies, Psychotropic, Isolation Precautions Code Status Info: DNR ?Allergies Info: Diphenhydramine Hcl, Rivastigmine tartrate ?Psychotropic Info: Zoloft ?  ?Isolation Precautions Info: COVID+ 3/28 ?   ? ?Current Medications (10/05/2021):  This is the current hospital active medication list ?Current Facility-Administered Medications  ?Medication Dose Route Frequency Provider Last Rate Last Admin  ? acetaminophen (TYLENOL) tablet 650 mg  650 mg Oral Q6H PRN Adefeso, Oladapo, DO      ? ascorbic acid (VITAMIN C) tablet 500 mg  500 mg Oral Daily Adefeso, Oladapo, DO   500 mg at 10/05/21 0944  ? chlorpheniramine-HYDROcodone 10-8 MG/5ML suspension 5 mL  5 mL Oral Q12H PRN Adefeso, Oladapo, DO      ? enoxaparin (LOVENOX) injection 30 mg  30 mg Subcutaneous Q24H Adefeso, Oladapo, DO   30 mg at 10/05/21 0944  ?  guaiFENesin-dextromethorphan (ROBITUSSIN DM) 100-10 MG/5ML syrup 10 mL  10 mL Oral Q4H PRN Adefeso, Oladapo, DO      ? melatonin tablet 6 mg  6 mg Oral QHS PRN Adefeso, Oladapo, DO      ? metoprolol succinate (TOPROL-XL) 24 hr tablet 25 mg  25 mg Oral Daily Adefeso, Oladapo, DO   25 mg at 10/05/21 0944  ? nirmatrelvir/ritonavir EUA (renal dosing) (PAXLOVID) 2 tablet  2 tablet Oral BID Kommor,  Debe Coder, MD   2 tablet at 10/05/21 0945  ? senna-docusate (Senokot-S) tablet 1 tablet  1 tablet Oral BID Murlean Iba, MD   1 tablet at 10/05/21 0944  ? sertraline (ZOLOFT) tablet 50 mg  50 mg Oral Daily Adefeso, Oladapo, DO   50 mg at 10/05/21 0944  ? zinc sulfate capsule 220 mg  220 mg Oral Daily Adefeso, Oladapo, DO   220 mg at 10/05/21 0944  ? ? ? ?Discharge Medications: ?Please see discharge summary for a list of discharge medications. ? ?Relevant Imaging Results: ? ?Relevant Lab Results: ? ? ?Additional Information ?SSN [id number]. COVID+ 3/28. ? ?Nieshia Larmon D, LCSW ? ? ? ? ?

## 2021-10-05 NOTE — TOC Transition Note (Signed)
Transition of Care (TOC) - CM/SW Discharge Note ? ? ?Patient Details  ?Name: [name] ?MRN: [id number] ?Date of Birth: 1926/05/26 ? ?Transition of Care (TOC) CM/SW Contact:  ?Tephanie Escorcia D, LCSW ?Phone Number: ?10/05/2021, 3:29 PM ? ? ?Clinical Narrative:    ?Discharge clinicals sent to facility. TOC signing off.  ? ? ?Final next level of care: [place] ?Barriers to Discharge: No Barriers Identified ? ? ?Patient Goals and CMS Choice ?Patient states their goals for this hospitalization and ongoing recovery are:: rehab ?  ?  ? ?Discharge Placement ?  ?           ?Patient chooses bed at: Other - please specify in the comment section below: ([facility]) ?Patient to be transferred to facility by: [facility] ?Name of family member notified: [name], daughter; and Zenia Resides, son in law ?Patient and family notified of of transfer: 10/05/21 ? ?Discharge Plan and Services ?  ?  ?Post Acute Care Choice: [place]          ?  ?  ?  ?  ?  ?  ?  ?  ?  ?  ? ?Social Determinants of Health (SDOH) Interventions ?  ? ? ?Readmission Risk Interventions ?   ? View : No data to display.  ?  ?  ?  ? ? ? ? ? ?

## 2021-10-05 NOTE — Progress Notes (Signed)
Please be advised that the above-named patient has a primary diagnosis of dementia which supersedes any psychiatric diagnosis. ? ? ? ? ?

## 2021-10-05 NOTE — Progress Notes (Signed)
DOB: 08/15/1925 ? ?Must ID: [id number] ?  ?To Whom it May Concern:  ?Please be advised that the above named patient will require a short-term nursing home stay- anticipated 30 days or less rehabilitation and strengthening. The plan is for return home. ? ?

## 2021-10-05 NOTE — Discharge Summary (Signed)
Physician Discharge Summary  ?Winola Drum [place] RSW:[id number] DOB: 03-14-1926 DOA: 10/03/2021 ? ?PCP: Ngetich, Nelda Bucks, NP ? ?Admit date: 10/03/2021 ?Discharge date: 10/05/2021 ? ?Disposition: SNF  ? ?Recommendations for Outpatient Follow-up:  ?Follow up with PCP in 2 weeks ?Ambulatory referral to palliative care recommended ? ?Discharge Condition: STABLE   ?CODE STATUS: DNR  ?DIET: regular ? ?Brief Hospitalization Summary: ?Please see all hospital notes, images, labs for full details of the hospitalization. ? 88 y.o. female with medical history significant of dementia with behavioral ?disturbance, hypertension who presents to the emergency department accompanied by her daughter [name]) due to concern for fever.  Patient was unable to provide history possibly due to underlying dementia, history was obtained from ED physician and ED medical record.  Per report, she lives with her daughter [name]) and both of them contracted COVID-19 virus infection, patient has been having runny nose, cough, weakness since last 1 to 2 days and she was reported to be weaker. ?  ?ED course: ?In the emergency department, patient was hemodynamically stable, vital signs were normal and O2 sat was 92-100% on room air.  Work-up in the ED showed normal CBC except for thrombocytopenia, BMP was normal except for slight elevation in creatinine at 1.06 and CBG of 102.  Influenza A, B was negative.  SARS coronavirus 2 was positive.  Chest x-ray showed bibasilar subsegmental atelectasis. No focal airspace consolidation.  She was started on Tylenol, Paxlovid and IV hydration.  Hospitalist was asked to admit patient for further evaluation and management.  ? ?10/04/2021:  Pt remains pleasantly confused.  She is not having any shortness of breath and is not on oxygen and stable on room air.   Awaiting for a PT evaluation.  ? ?10/05/2021:  PT recommending SNF. DC to SNF.  Otherwise patient remains clinically stable on room air, pleasantly confused with  dementia.  ? ?Assessment and Plan: ?* COVID-19 virus infection ?--Pt remains on room air. ?--very high d dimer related to Covid infection and is trending down   ?--VQ scan was negative for PE. ?--venous US legs negative for DVT ?--complete last remaining doses of paxlovid ? ?Atelectasis ?-encouraging incentive spirometry  ? ?Thrombocytopenia ([place]) ?--stable, no active bleeding.  Likely related to advanced age and comorbidities.  ? ?Depression ?--currently stable.   ? ?Dementia ([place]) ?--delirium precautions.  ?--SNF placement in progress. ? ?Palpitations ?--stable.  No current symptoms.   ? ?Discharge Diagnoses:  ?Principal Problem: ?  COVID-19 virus infection ?Active Problems: ?  Palpitations ?  Dementia ([place]) ?  Depression ?  Thrombocytopenia ([place]) ?  Atelectasis ? ? ?Discharge Instructions: ? ?Allergies as of 10/05/2021   ? ?   Reactions  ? Diphenhydramine Hcl Other (See Comments)  ? unknown  ? Rivastigmine Tartrate Palpitations  ? ?  ? ?  ?Medication List  ?  ? ?STOP taking these medications   ? ?molnupiravir EUA 200 mg Caps capsule ?Commonly known as: LAGEVRIO ?  ?zinc gluconate 50 MG tablet ?  ? ?  ? ?TAKE these medications   ? ?acetaminophen 325 MG tablet ?Commonly known as: TYLENOL ?Take 2 tablets (650 mg total) by mouth every 6 (six) hours as needed for mild pain, moderate pain or fever. ?  ?ascorbic acid 500 MG tablet ?Commonly known as: VITAMIN C ?Take 1 tablet (500 mg total) by mouth daily for 10 days. ?What changed:  ?medication strength ?how much to take ?  ?cholecalciferol 25 MCG (1000 UNIT) tablet ?Commonly known as: VITAMIN D ?Take 1  tablet (1,000 Units total) by mouth every morning for 14 days. ?What changed: how much to take ?  ?CRANBERRY PO ?Take 25 mg by mouth daily. ?  ?fexofenadine 180 MG tablet ?Commonly known as: ALLEGRA ?Take 0.5 tablets (90 mg total) by mouth as needed for allergies or rhinitis. ?What changed:  ?how much to take ?reasons to take this ?  ?fluticasone 50 MCG/ACT nasal  spray ?Commonly known as: FLONASE ?Place 1 spray into both nostrils as needed. ?  ?Melatonin 10 MG Tabs ?Take 10 mg by mouth daily. ?  ?metoprolol succinate 25 MG 24 hr tablet ?Commonly known as: TOPROL-XL ?Take 1 tablet by mouth once daily ?  ?nirmatrelvir/ritonavir EUA (renal dosing) 10 x 150 MG & 10 x '100MG'$  Tabs ?Commonly known as: PAXLOVID ?Take 2 tablets by mouth 2 (two) times daily for 6 doses. ?  ?polyethylene glycol powder 17 GM/SCOOP powder ?Commonly known as: GLYCOLAX/MIRALAX ?Take 17 g by mouth daily. Hold for loose stool ?  ?sertraline 50 MG tablet ?Commonly known as: ZOLOFT ?Take 1 tablet by mouth once daily ?  ?zinc sulfate 220 (50 Zn) MG capsule ?Take 1 capsule (220 mg total) by mouth daily for 10 days. ?Start taking on: October 06, 2021 ?  ? ?  ? ? Follow-up Information   ? ? Ngetich, Nelda Bucks, NP. Schedule an appointment as soon as possible for a visit in 2 week(s).   ?Specialty: Family Medicine ?Why: Hospital Follow Up ?Contact information: ?[address] ?[place] [address] ?[phone] ? ? ?  ?  ? ?  ?  ? ?  ? ?Allergies  ?Allergen Reactions  ? Diphenhydramine Hcl Other (See Comments)  ?  unknown  ? Rivastigmine Tartrate Palpitations  ? ?Allergies as of 10/05/2021   ? ?   Reactions  ? Diphenhydramine Hcl Other (See Comments)  ? unknown  ? Rivastigmine Tartrate Palpitations  ? ?  ? ?  ?Medication List  ?  ? ?STOP taking these medications   ? ?molnupiravir EUA 200 mg Caps capsule ?Commonly known as: LAGEVRIO ?  ?zinc gluconate 50 MG tablet ?  ? ?  ? ?TAKE these medications   ? ?acetaminophen 325 MG tablet ?Commonly known as: TYLENOL ?Take 2 tablets (650 mg total) by mouth every 6 (six) hours as needed for mild pain, moderate pain or fever. ?  ?ascorbic acid 500 MG tablet ?Commonly known as: VITAMIN C ?Take 1 tablet (500 mg total) by mouth daily for 10 days. ?What changed:  ?medication strength ?how much to take ?  ?cholecalciferol 25 MCG (1000 UNIT) tablet ?Commonly known as: VITAMIN D ?Take 1 tablet  (1,000 Units total) by mouth every morning for 14 days. ?What changed: how much to take ?  ?CRANBERRY PO ?Take 25 mg by mouth daily. ?  ?fexofenadine 180 MG tablet ?Commonly known as: ALLEGRA ?Take 0.5 tablets (90 mg total) by mouth as needed for allergies or rhinitis. ?What changed:  ?how much to take ?reasons to take this ?  ?fluticasone 50 MCG/ACT nasal spray ?Commonly known as: FLONASE ?Place 1 spray into both nostrils as needed. ?  ?Melatonin 10 MG Tabs ?Take 10 mg by mouth daily. ?  ?metoprolol succinate 25 MG 24 hr tablet ?Commonly known as: TOPROL-XL ?Take 1 tablet by mouth once daily ?  ?nirmatrelvir/ritonavir EUA (renal dosing) 10 x 150 MG & 10 x '100MG'$  Tabs ?Commonly known as: PAXLOVID ?Take 2 tablets by mouth 2 (two) times daily for 6 doses. ?  ?polyethylene glycol powder 17  GM/SCOOP powder ?Commonly known as: GLYCOLAX/MIRALAX ?Take 17 g by mouth daily. Hold for loose stool ?  ?sertraline 50 MG tablet ?Commonly known as: ZOLOFT ?Take 1 tablet by mouth once daily ?  ?zinc sulfate 220 (50 Zn) MG capsule ?Take 1 capsule (220 mg total) by mouth daily for 10 days. ?Start taking on: October 06, 2021 ?  ? ?  ? ? ?Procedures/Studies: ?NM Pulmonary Perfusion ? ?Result Date: 10/04/2021 ?CLINICAL DATA:  PE suspected, elevated D-dimer, shortness of breath EXAM: NUCLEAR MEDICINE PERFUSION LUNG SCAN TECHNIQUE: Perfusion images were obtained in multiple projections after intravenous injection of radiopharmaceutical. Ventilation scans intentionally deferred if perfusion scan and chest x-ray adequate for interpretation during COVID 19 epidemic. RADIOPHARMACEUTICALS:  4.4 mCi Tc-39mMAA IV COMPARISON:  Chest radiograph, 10/03/2021 FINDINGS: Normal, homogeneous perfusion of the bilateral lungs. No suspicious perfusion defects. IMPRESSION: Very low probability for pulmonary embolism by modified perfusion only PIOPED criteria (PE absent). Electronically Signed   By: ADelanna AhmadiM.D.   On: 10/04/2021 11:43  ? ?UKoreaVenous Img  Lower Bilateral (DVT) ? ?Result Date: 10/04/2021 ?CLINICAL DATA:  Positive D-dimer. Clinical concern for deep venous thrombosis. EXAM: BILATERAL LOWER EXTREMITY VENOUS DOPPLER ULTRASOUND TECHNIQUE: Gray-scale sono

## 2021-10-05 NOTE — Evaluation (Signed)
Physical Therapy Evaluation ?Patient Details ?Name: Kathleen Gallegos ?MRN: [id number] ?DOB: [birth year] ?Today's Date: 10/05/2021 ? ?History of Present Illness ? Kathleen Gallegos is a 88 y.o. female with medical history significant of dementia with behavioral  disturbance, hypertension who presents to the emergency department accompanied by her daughter [name]) due to concern for fever.  Patient was unable to provide history possibly due to underlying dementia, history was obtained from ED physician and ED medical record.  Per report, she lives with her daughter Kathleen Gallegos) and both of them contracted COVID-19 virus infection, patient has been having runny nose, cough, weakness since last 1 to 2 days and she was reported to be weaker. ?  ?Clinical Impression ? Patient demonstrates slow labored movement for sitting up at bedside with frequent leaning backwards and c/o pain in BLE with pressure and movement.  Patient very unsteady on feet with posterior leaning requiring Max assist to maintain standing balance and limited to a few short shuffling side steps before having to sit due to fatigue and poor standing balance.  Patient tolerated sitting up in chair after therapy - nursing staff notified.  Patient will benefit from continued skilled physical therapy in hospital and recommended venue below to increase strength, balance, endurance for safe ADLs and gait.  ? ?   ?   ? ?Recommendations for follow up therapy are one component of a multi-disciplinary discharge planning process, led by the attending physician.  Recommendations may be updated based on patient status, additional functional criteria and insurance authorization. ? ?Follow Up Recommendations Skilled nursing-short term rehab (<3 hours/day) ? ?  ?Assistance Recommended at Discharge Intermittent Supervision/Assistance  ?Patient can return home with the following ? A lot of help with bathing/dressing/bathroom;A lot of help with walking and/or transfers;Help  with stairs or ramp for entrance;Assistance with cooking/housework ? ?  ?Equipment Recommendations None recommended by PT  ?Recommendations for Other Services ?    ?  ?Functional Status Assessment Patient has had a recent decline in their functional status and demonstrates the ability to make significant improvements in function in a reasonable and predictable amount of time.  ? ?  ?Precautions / Restrictions Precautions ?Precautions: Fall ?Restrictions ?Weight Bearing Restrictions: No  ? ?  ? ?Mobility ? Bed Mobility ?Overal bed mobility: Needs Assistance ?Bed Mobility: Supine to Sit ?  ?  ?Supine to sit: Mod assist, Max assist ?  ?  ?General bed mobility comments: increased time, labored movement ?  ? ?Transfers ?Overall transfer level: Needs assistance ?Equipment used: Rolling walker (2 wheels) ?Transfers: Sit to/from Stand, Bed to chair/wheelchair/BSC ?Sit to Stand: Mod assist, Max assist ?  ?Step pivot transfers: Mod assist, Max assist ?  ?  ?  ?General transfer comment: very unsteady on feet with difficulty maintaing standing balance due to posterior leaning ?  ? ?Ambulation/Gait ?Ambulation/Gait assistance: Max assist ?Gait Distance (Feet): 4 Feet ?Assistive device: Rolling walker (2 wheels) ?Gait Pattern/deviations: Decreased step length - right, Decreased step length - left, Decreased stride length, Shuffle, Leaning posteriorly ?Gait velocity: slow ?  ?  ?General Gait Details: limite to a few slow labored shuffling side steps with Max tactile assistance to move legs ? ?Stairs ?  ?  ?  ?  ?  ? ?Wheelchair Mobility ?  ? ?Modified Rankin (Stroke Patients Only) ?  ? ?  ? ?Balance Overall balance assessment: Needs assistance ?Sitting-balance support: Feet supported, No upper extremity supported ?Sitting balance-Leahy Scale: Poor ?Sitting balance - Comments: fair/poor seated at EOB ?Postural control:  Posterior lean ?Standing balance support: During functional activity, Reliant on assistive device for balance,  Bilateral upper extremity supported ?Standing balance-Leahy Scale: Poor ?Standing balance comment: using RW ?  ?  ?  ?  ?  ?  ?  ?  ?  ?  ?  ?   ? ? ? ?Pertinent Vitals/Pain Pain Assessment ?Pain Assessment: Faces ?Faces Pain Scale: Hurts little more ?Pain Location: BLE with pressure/movement ?Pain Descriptors / Indicators: Grimacing, Guarding, Discomfort ?Pain Intervention(s): Limited activity within patient's tolerance, Monitored during session, Repositioned  ? ? ?Home Living Family/patient expects to be discharged to:: Private residence ?Living Arrangements: Children ?Available Help at Discharge: Family;Available PRN/intermittently ?  ?  ?  ?  ?  ?  ?Home Equipment: Conservation officer, nature (2 wheels) ?Additional Comments: Patient is poor historian  ?  ?Prior Function Prior Level of Function : Needs assist ?  ?  ?  ?Physical Assist : Mobility (physical);ADLs (physical) ?Mobility (physical): Bed mobility;Transfers;Gait;Stairs ?  ?Mobility Comments: household ambulator using RW, "per patient" ?ADLs Comments: assisted by family ?  ? ? ?Hand Dominance  ?   ? ?  ?Extremity/Trunk Assessment  ? Upper Extremity Assessment ?Upper Extremity Assessment: Generalized weakness ?  ? ?Lower Extremity Assessment ?Lower Extremity Assessment: Generalized weakness ?  ? ?Cervical / Trunk Assessment ?Cervical / Trunk Assessment: Kyphotic  ?Communication  ? Communication: No difficulties  ?Cognition Arousal/Alertness: Awake/alert ?Behavior During Therapy: Anxious, WFL for tasks assessed/performed ?Overall Cognitive Status: History of cognitive impairments - at baseline ?  ?  ?  ?  ?  ?  ?  ?  ?  ?  ?  ?  ?  ?  ?  ?  ?  ?  ?  ? ?  ?General Comments   ? ?  ?Exercises    ? ?Assessment/Plan  ?  ?PT Assessment Patient needs continued PT services  ?PT Problem List Decreased strength;Decreased activity tolerance;Decreased balance;Decreased mobility ? ?   ?  ?PT Treatment Interventions DME instruction;Gait training;Stair training;Functional mobility  training;Therapeutic activities;Therapeutic exercise   ? ?PT Goals (Current goals can be found in the Care Plan section)  ?Acute Rehab PT Goals ?Patient Stated Goal: return home ?PT Goal Formulation: With patient ?Time For Goal Achievement: 10/19/21 ?Potential to Achieve Goals: Good ? ?  ?Frequency Min 3X/week ?  ? ? ?Co-evaluation   ?  ?  ?  ?  ? ? ?  ?AM-PAC PT "6 Clicks" Mobility  ?Outcome Measure Help needed turning from your back to your side while in a flat bed without using bedrails?: A Lot ?Help needed moving from lying on your back to sitting on the side of a flat bed without using bedrails?: A Lot ?Help needed moving to and from a bed to a chair (including a wheelchair)?: A Lot ?Help needed standing up from a chair using your arms (e.g., wheelchair or bedside chair)?: A Lot ?Help needed to walk in hospital room?: A Lot ?Help needed climbing 3-5 steps with a railing? : Total ?6 Click Score: 11 ? ?  ?End of Session   ?Activity Tolerance: Patient tolerated treatment well;Patient limited by fatigue ?Patient left: in chair;with call bell/phone within reach;with chair alarm set ?Nurse Communication: Mobility status ?PT Visit Diagnosis: Unsteadiness on feet (R26.81);Other abnormalities of gait and mobility (R26.89);Muscle weakness (generalized) (M62.81) ?  ? ?Time: 2536-6440 ?PT Time Calculation (min) (ACUTE ONLY): 24 min ? ? ?Charges:   PT Evaluation ?$PT Eval Moderate Complexity: 1 Mod ?PT Treatments ?$Therapeutic Activity: 23-37 mins ?  ?   ? ? ?  11:25 AM, 10/05/21 ?Lonell Grandchild, MPT ?Physical Therapist with [place] ?[facility] ?[phone] office ?[phone] mobile phone ? ? ?

## 2021-10-05 NOTE — Plan of Care (Signed)
?  Problem: Acute Rehab PT Goals(only PT should resolve) ?Goal: Pt Will Go Supine/Side To Sit ?Outcome: Progressing ?Flowsheets (Taken 10/05/2021 1126) ?Pt will go Supine/Side to Sit: ? with minimal assist ? with moderate assist ?Goal: Patient Will Transfer Sit To/From Stand ?Outcome: Progressing ?Flowsheets (Taken 10/05/2021 1126) ?Patient will transfer sit to/from stand: ? with minimal assist ? with moderate assist ?Goal: Pt Will Transfer Bed To Chair/Chair To Bed ?Outcome: Progressing ?Flowsheets (Taken 10/05/2021 1126) ?Pt will Transfer Bed to Chair/Chair to Bed: with mod assist ?Goal: Pt Will Ambulate ?Outcome: Progressing ?Flowsheets (Taken 10/05/2021 1126) ?Pt will Ambulate: ? 15 feet ? with moderate assist ? with rolling walker ?  ?11:26 AM, 10/05/21 ?Lonell Grandchild, MPT ?Physical Therapist with [place] ?[facility] ?[phone] office ?[phone] mobile phone ? ?

## 2021-10-09 DIAGNOSIS — Z79899 Other long term (current) drug therapy: Secondary | ICD-10-CM | POA: Diagnosis not present

## 2021-10-09 DIAGNOSIS — F0390 Unspecified dementia without behavioral disturbance: Secondary | ICD-10-CM | POA: Diagnosis not present

## 2021-10-09 DIAGNOSIS — U071 COVID-19: Secondary | ICD-10-CM | POA: Diagnosis not present

## 2021-10-09 DIAGNOSIS — I10 Essential (primary) hypertension: Secondary | ICD-10-CM | POA: Diagnosis not present

## 2021-10-09 DIAGNOSIS — F32A Depression, unspecified: Secondary | ICD-10-CM | POA: Diagnosis not present

## 2021-10-09 DIAGNOSIS — E559 Vitamin D deficiency, unspecified: Secondary | ICD-10-CM | POA: Diagnosis not present

## 2021-10-16 ENCOUNTER — Telehealth: Payer: Self-pay | Admitting: Internal Medicine

## 2021-10-16 DIAGNOSIS — U071 COVID-19: Secondary | ICD-10-CM | POA: Diagnosis not present

## 2021-10-16 DIAGNOSIS — G4700 Insomnia, unspecified: Secondary | ICD-10-CM | POA: Diagnosis not present

## 2021-10-16 DIAGNOSIS — F32A Depression, unspecified: Secondary | ICD-10-CM | POA: Diagnosis not present

## 2021-10-16 DIAGNOSIS — F0390 Unspecified dementia without behavioral disturbance: Secondary | ICD-10-CM | POA: Diagnosis not present

## 2021-10-16 DIAGNOSIS — I10 Essential (primary) hypertension: Secondary | ICD-10-CM | POA: Diagnosis not present

## 2021-10-16 NOTE — Telephone Encounter (Signed)
Kathleen Gallegos daughter is returning phone call. Kathleen Gallegos phone number is [phone]. ?

## 2021-10-16 NOTE — Telephone Encounter (Signed)
Called and spoke with patient's daughter. She stated that the patient was diagnosed with COVID on 10/03/21. She was in the hospital at [facility] for 2 days before discharged to a rehab facility [facility] in [place]. Per [name], the patient has had a cough ever since being there. It is a non-productive cough. The patient does not seem to be in any distress but she is concerned about the cough. Denied any fevers, chest pain or wheezing.  ? ?I explained to her that since the patient has not been seen since 2016 she would need to contact the physician at the rehab center or her PCP, especially since she is not able to come in for an OV. She stated that she has been in contact with both and no one seems to be concerned about the cough. She is being followed by Dr. Estevan Ryder at the facility.  ? ?Despite her not being seen since 2016, she still wanted me to send a message to Dr. Annamaria Boots to see if he has any recommendations since no one else seems concerned.  ? ?Dr. Annamaria Boots, can you please advise? Thanks!  ?

## 2021-10-16 NOTE — Telephone Encounter (Signed)
ATC patient. It rang a few times then the line went busy.  ?

## 2021-10-16 NOTE — Telephone Encounter (Signed)
ATC patient. Patient did not answer. Could not leave a voicemail. Will try to call patient again.  ?

## 2021-10-16 NOTE — Telephone Encounter (Signed)
Cough is expected with Covid infection. I suggest they ask at the facility where she stays, for permission to give her otc cough syrup like Delsym. ?

## 2021-10-18 DIAGNOSIS — J302 Other seasonal allergic rhinitis: Secondary | ICD-10-CM | POA: Diagnosis not present

## 2021-10-18 DIAGNOSIS — I10 Essential (primary) hypertension: Secondary | ICD-10-CM | POA: Diagnosis not present

## 2021-10-18 DIAGNOSIS — F0390 Unspecified dementia without behavioral disturbance: Secondary | ICD-10-CM | POA: Diagnosis not present

## 2021-10-18 DIAGNOSIS — E559 Vitamin D deficiency, unspecified: Secondary | ICD-10-CM | POA: Diagnosis not present

## 2021-10-18 DIAGNOSIS — R059 Cough, unspecified: Secondary | ICD-10-CM | POA: Diagnosis not present

## 2021-10-19 ENCOUNTER — Telehealth: Payer: Self-pay | Admitting: Internal Medicine

## 2021-10-19 NOTE — Telephone Encounter (Signed)
Called [name] and someone answered the phone without saying anything. Will attempt to call back.  ?

## 2021-10-19 NOTE — Progress Notes (Signed)
PC SW spoke with patients daughter, Kathleen Gallegos.  ? ?Daughter shared that patient is currently at [facility] due to [place] after hospital stay and voiced initial care concerns, but states that she feels patient is still weak and more incontinent now with some bed sores. On puree diet.  ? ?Daughter discussed care plan and the possibility of hospice order from facility upon DC. Daughter shared she is still open to hospice for patient as she feels patient needs the service and support. ? ?Daughter states patient is expected to be at [facility] until 5/15. SW advised daughter that [facility] NP will plan to visit patient at SNF prior to DC. ?

## 2021-10-20 IMAGING — CT CT HEAD W/O CM
3 series · 15 of 47 positions shown, 18 images · non-contrast
Comparison: Brain MRI 03/31/2009.  Head CT 12/27/2012.

CLINICAL DATA: [AGE] female is combative. Delirium. Possible
dementia.

EXAM:
CT HEAD WITHOUT CONTRAST
TECHNIQUE: Contiguous axial images were obtained from the base of the skull
through the vertex without intravenous contrast.

[Series 2: head w o · axial · 0.41mm/px · z∈[+12,+142]mm · 9 of 32 slices shown, 12 images]
[im 3/32  brain]
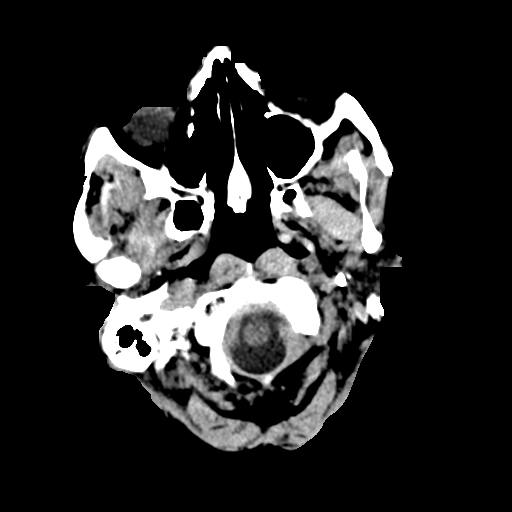
[im 3/32  bone]
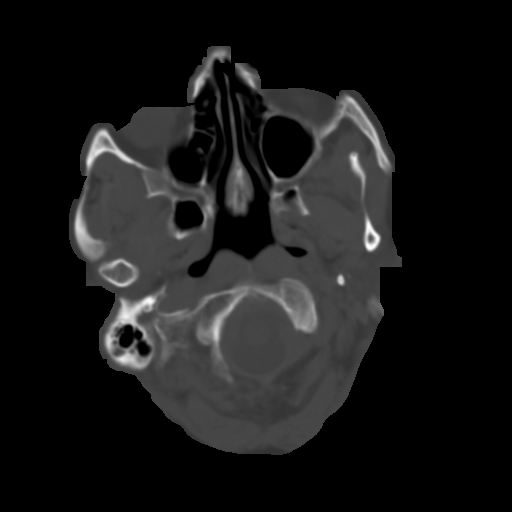
[im 6/32  brain]
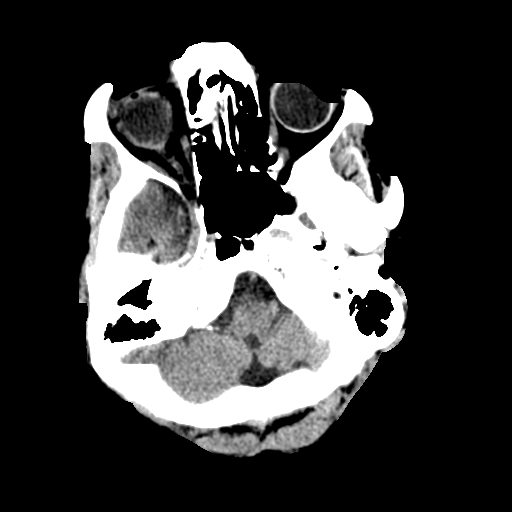
[im 9/32  brain]
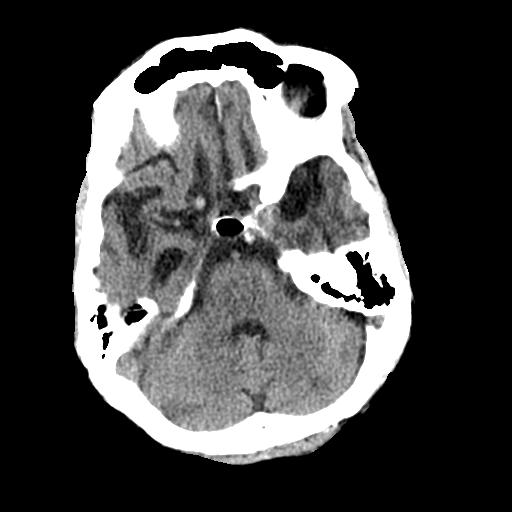
[im 12/32  brain]
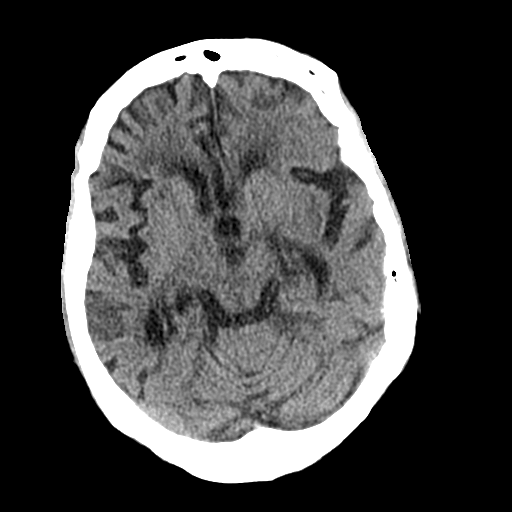
[im 17/32  brain]
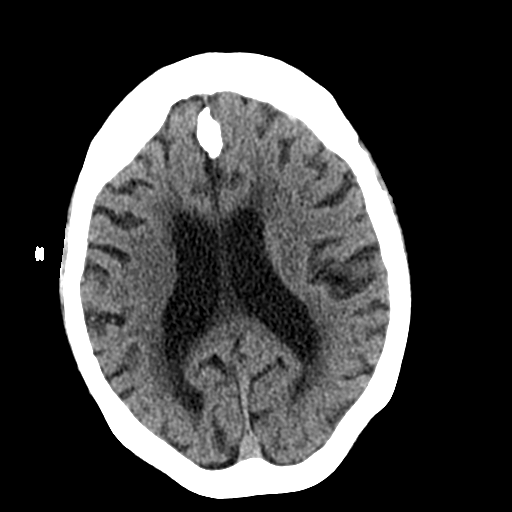
[im 17/32  bone]
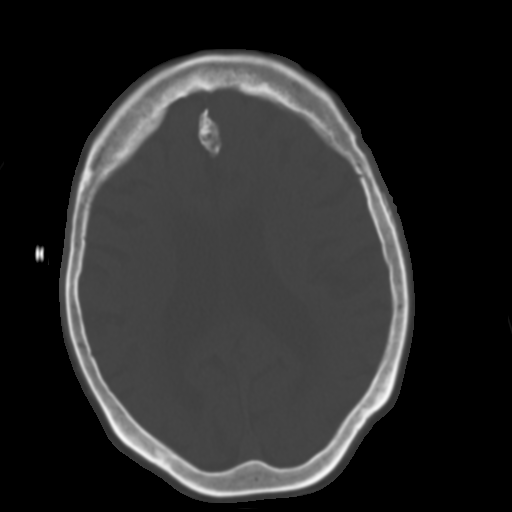
[im 20/32  brain]
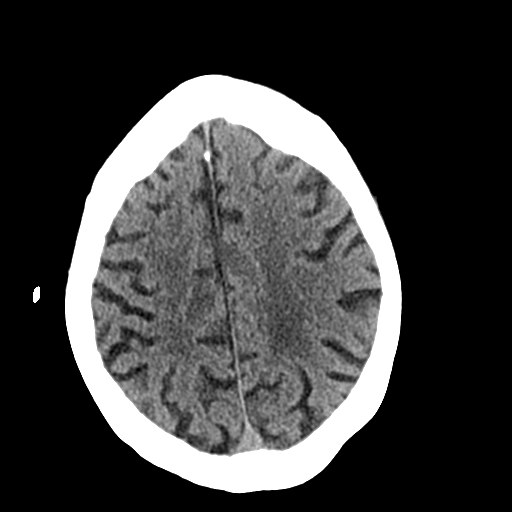
[im 23/32  brain]
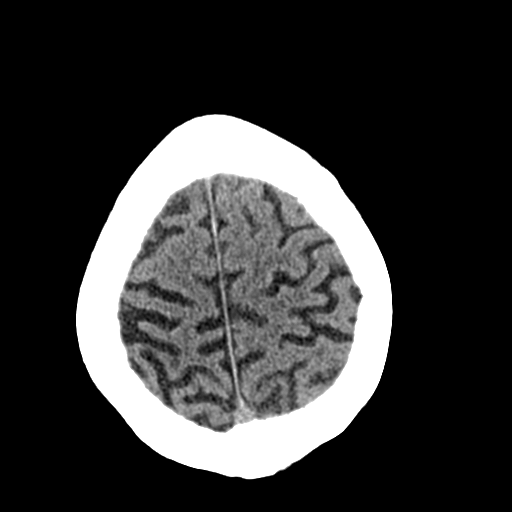
[im 26/32  brain]
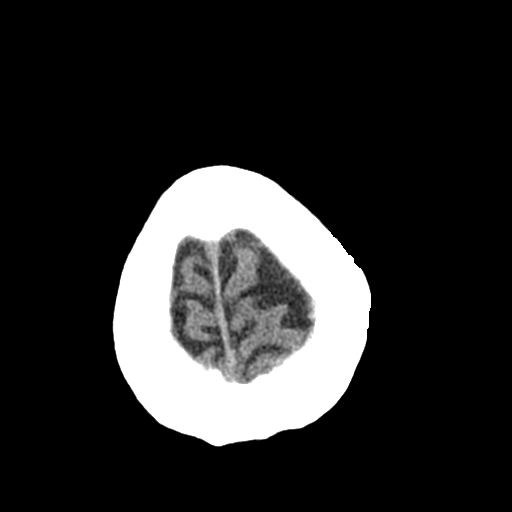
[im 29/32  brain]
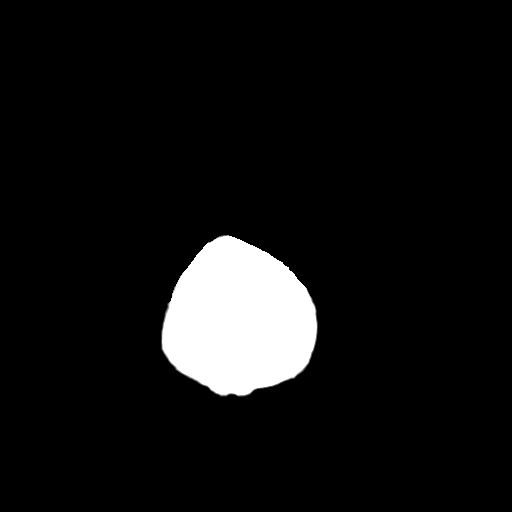
[im 29/32  bone]
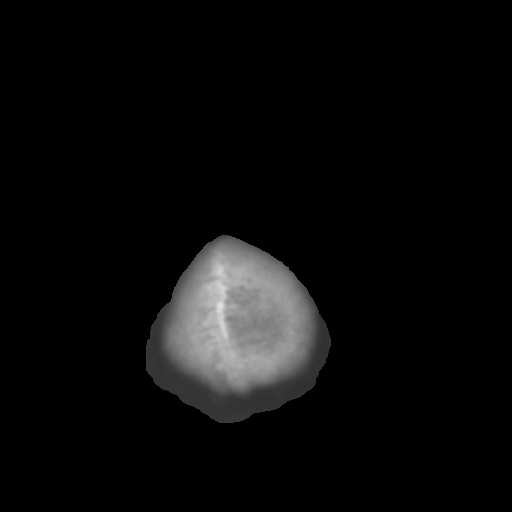

[Series 4: coronal soft · coronal · 0.31mm/px · 3 of 68 slices shown]
[im 23/68  brain]
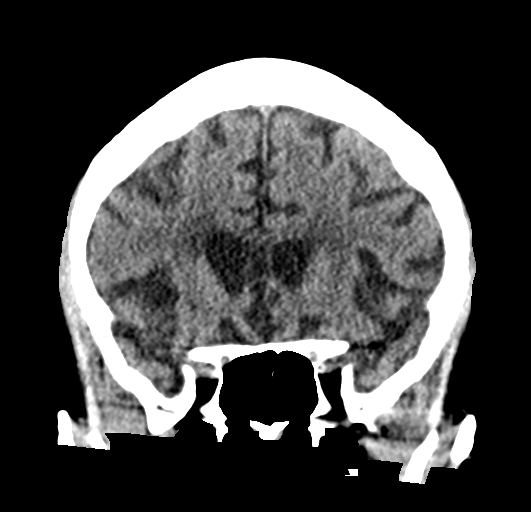
[im 30/68  brain]
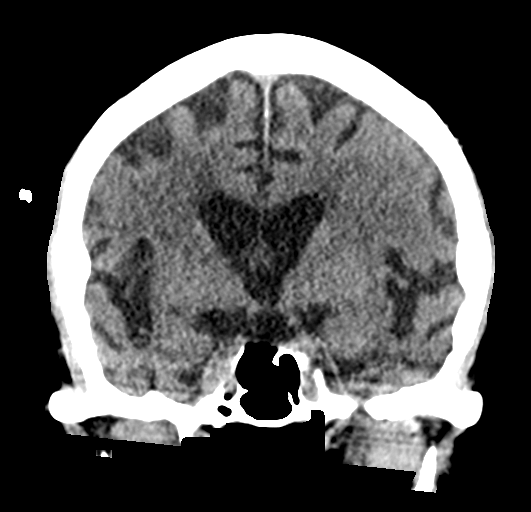
[im 38/68  brain]
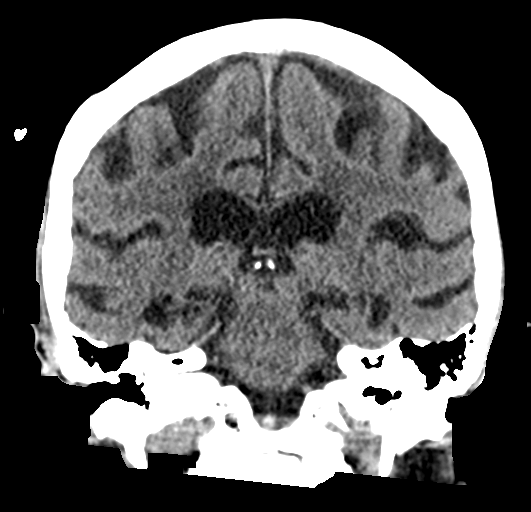

[Series 5: sagittal soft · sagittal · 0.31mm/px · 3 of 54 slices shown]
[im 18/54  brain]
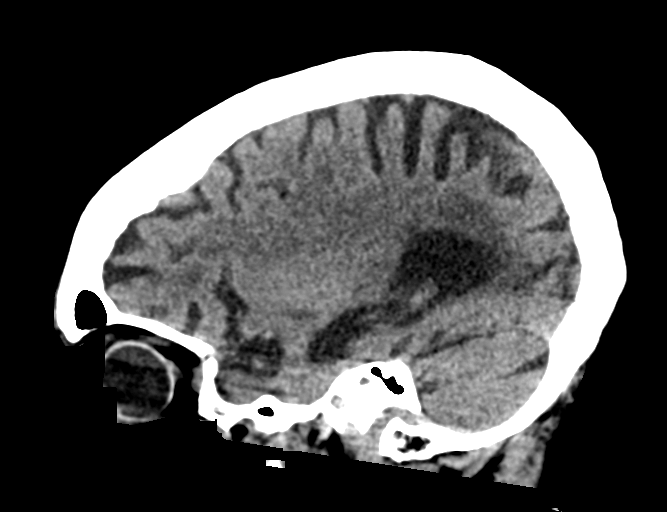
[im 27/54  brain]
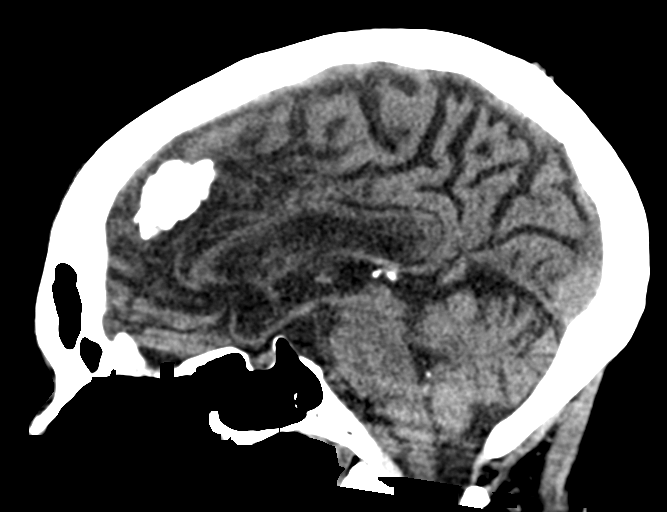
[im 36/54  brain]
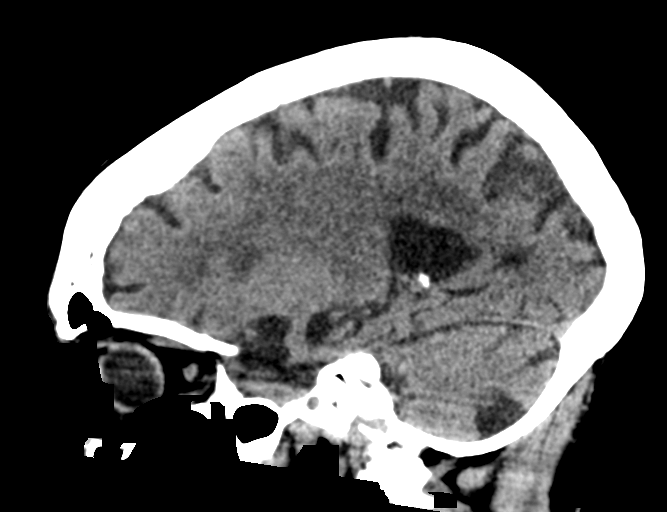

[15 of 47 positions shown; findings below may reference images not displayed]

FINDINGS: Brain: Cerebral volume loss since 3116 appears fairly generalized.
However, there is suggestion of disproportionate biparietal atrophy
such as on sagittal image 19.

There is an unchanged chronic infarct in the left cerebellum PICA
territory. Much smaller right SCA territory infarct is new since
3116 but appears chronic (series 2, image 11). Patchy chronic
encephalomalacia also suspected in the right occipital pole and
chronic but increased since 3116. Patchy bilateral cerebral white
matter hypodensity is chronic but increased, including at the
anterior limb of the left internal capsule which appears most
affected.

No midline shift, ventriculomegaly, mass effect, evidence of mass
lesion, intracranial hemorrhage or evidence of cortically based
acute infarction.

Vascular: Mild Calcified atherosclerosis at the skull base.6 No
suspicious intracranial vascular hyperdensity.

Skull: No acute osseous abnormality identified.

Sinuses/Orbits: Visualized paranasal sinuses and mastoids are stable
and well pneumatized.

Other: Stable orbit and scalp soft tissues.
IMPRESSION: 1. No acute intracranial abnormality identified.
2. Suggestion of disproportionate biparietal cerebral volume loss
since 3116, which can be seen in the setting of Alzheimer dementia.
3. However, chronic ischemic disease has also progressed. Consider
also vascular causes of dementia.

## 2021-10-23 DIAGNOSIS — G308 Other Alzheimer's disease: Secondary | ICD-10-CM | POA: Diagnosis not present

## 2021-10-23 DIAGNOSIS — F0280 Dementia in other diseases classified elsewhere without behavioral disturbance: Secondary | ICD-10-CM | POA: Diagnosis not present

## 2021-10-23 DIAGNOSIS — J9811 Atelectasis: Secondary | ICD-10-CM | POA: Diagnosis not present

## 2021-10-26 NOTE — Progress Notes (Signed)
PC SW received a TC from patients daughter, [name]. ? ?Daughter shared that patient is expected to DC from [facility] on 4/78 and DC planner is inquiring about patients DC plan. Home with [facility] vs home with hospice. ? ?SW encouraged daughter to discuss with family in home options if patient is not hospice appropriate, as daughter has shared that she can no longer care for patient at home.  ? ?PC NP is scheduled to visit patient at [facility] on Fri 4/21 and discuss hospice eligibility if appropriate.  ?

## 2021-10-27 ENCOUNTER — Non-Acute Institutional Stay: Payer: Medicare Other | Admitting: Nurse Practitioner

## 2021-10-27 ENCOUNTER — Encounter: Payer: Self-pay | Admitting: Nurse Practitioner

## 2021-10-27 DIAGNOSIS — Z515 Encounter for palliative care: Secondary | ICD-10-CM | POA: Diagnosis not present

## 2021-10-27 DIAGNOSIS — F0150 Vascular dementia without behavioral disturbance: Secondary | ICD-10-CM | POA: Diagnosis not present

## 2021-10-27 DIAGNOSIS — R634 Abnormal weight loss: Secondary | ICD-10-CM

## 2021-10-27 NOTE — Progress Notes (Addendum)
? ? ?Manufacturing engineer ?Community Pall[facility] Consult Note ?Telephone: [phone]  ?Fax: [phone]  ? ? ?Date of encounter: 10/27/21 ?5:27 PM ?PATIENT NAME: Kathleen Gallegos ?[place] [id number]   ?[phone] (home)  ?DOB: [birth year] ?MRN: [id number] ?PRIMARY CARE PROVIDER:   [facility] ?Ngetich, Nelda Bucks, NP,  ?[address] ?[place] [address] ?[phone] ? ?RESPONSIBLE PARTY:    ?Contact Information   ? ? Name Relation Home Work Mobile  ? Ninnie, Fein Daughter [phone]  [phone]  ? Della Goo Daughter [phone]  [phone]  ? ?  ? ?I met face to face with patient in facility. Palliative Care was asked to follow this patient by consultation request of  [place] to address advance care planning and complex medical decision making. This is a follow up visit.                                  ?ASSESSMENT AND PLAN / RECOMMENDATIONS:  ?Symptom Management/Plan: ?1. Advance Care Planning;  DNR ?  ?2. Vascular dementia with progression, wishes to focus on comfort care, currently Ms. Paff is receiving STR, therapy and should be d/c from facility next week. Family wishes d/c home with hospice. Explained to [place] may d/c home with hospice though they will do an AV visit when Ms. Trochez gets home to determine eligibility. If she is not eligible will continue on PC and ask HH to get involved. I have attempted to contact [name], Ms. Fiola daughter with 2 messages left without response to update on PC visit.  ? ?What has changed since last AV visit;  ?Continues to loose weight ?Now requires to be fed by staff or family, appears worsening dysphagia per staff which diet had to be changed to pureed. ?Non-ambulatory, requires to be bathed, dressed at the facility ?Recent hospitalization for covid infection, increase in weakness ? ?3. Weight loss secondary to dementia. Discussed weight loss with staff, changed to pureed texture with now having to be fed by  staff. We talked about nutrition, supplements, education completed.  ?  ?04/08/2020 weight 133 lbs ?05/03/2021 weight 119 lbs ?09/21/2021 weight 113 lbs ?10/12/2021 weight 111.4 lbs ?21.6 lbs loss in 7 months; 16.24% ?1.6 lb weight loss in 4 weeks; 1.42% ? ?10/21/2021 Sodium 135; potassium 4.3; chloride 98; Co2 25; Calcium 9.8; bun 19.5; creatinine 0.76; glucose 85; wbc 3.9; hgb 10.9; hct 31.7; platelets 196; albumin 3.7; total protein 7.9 ? ?Follow up Palliative Care Visit: Palliative care will continue to follow for complex medical decision making, advance care planning, and clarification of goals. Return 1 weeks or prn if not admitted to hospice ? ?I spent 48 minutes providing this consultation. More than 50% of the time in this consultation was spent in counseling and care coordination. ?PPS: 40% ? ?Chief Complaint: Follow up palliative consult for complex medical decision making ? ?HISTORY OF PRESENT ILLNESS:  Kathleen Gallegos is a 88 y.o. year old female  with multiple medical problems including dementia, asthma, lung nodule, OA, HLD, IBS, GERD, allergies, anxiety. Discharged from [facility] on 09/02/2021. I visited Kathleen Gallegos at [facility]. Staff endorses Kathleen Gallegos requires staff assistance with transfers, mobility able to sit in a w/c. Kathleen Gallegos does require assistance with ADL's including bathing, dressing, toileting with episodes of incontinence. Kathleen Gallegos now requires to be fed with worsening dysphagia per staff. Kathleen Gallegos now requires to be fed,  takes >30 minutes to feed per staff. Ms. Hust is now on a pureed diet following hospitalization from 10/03/2021 to 10/05/2021 for covid positive. At present Kathleen Gallegos is sitting in the w/c in the dining hall being fed by staff. Observed eating without coughing, choking. Kathleen Gallegos was confused, debilitated. No meaningful interactions due to cognitive impairment. Emotional support provided. I attempted twice to contact [name], Kathleen Gallegos  daughter for further discussion of poc as previously discussed with Ms. Gaeta her wishes are to d/c home with hospice services. I discussed with [facility], wishes are to d/c home with hospice. Therapeutic listening, emotional support provided. Questions answered.  .  ? ?History obtained from review of EMR, discussion with facility staff; Ms. Lilla Gallegos. 2 messages left for [name], daughter ?I reviewed available labs, medications, imaging, studies and related documents from the EMR.  Records reviewed and summarized above.  ? ?ROS ?10 point system reviewed with staff as Ms. Tracey is cognitively impaired, negative except HPI ? ?Physical Exam: ?Constitutional: NAD ?General: frail appearing, thin, debilitated, chronically ill, confused female ?EYES: lids intact ?ENMT: oral mucous membranes moist ?CV: S1S2, RRR ?Pulmonary: LCTA, no increased work of breathing, no cough, room air ?Abdomen: normo-active BS + 4 quadrants, soft and non tender ?MSK: non-ambulatory; w/c dependent ?Skin: warm and dry ?Neuro:  + generalized weakness,  + cognitive impairment ?Psych: flat affect, A and confused ?Thank you for the opportunity to participate in the care of Kathleen Gallegos.  The palliative care team will continue to follow. Please call our office at [phone] if we can be of additional assistance.  ? ?Bailee Metter Z Kentavious Michele, NP   ?

## 2021-10-30 ENCOUNTER — Telehealth: Payer: Self-pay | Admitting: Nurse Practitioner

## 2021-10-30 DIAGNOSIS — F32A Depression, unspecified: Secondary | ICD-10-CM | POA: Diagnosis not present

## 2021-10-30 DIAGNOSIS — R059 Cough, unspecified: Secondary | ICD-10-CM | POA: Diagnosis not present

## 2021-10-30 DIAGNOSIS — Z79899 Other long term (current) drug therapy: Secondary | ICD-10-CM | POA: Diagnosis not present

## 2021-10-30 DIAGNOSIS — G4700 Insomnia, unspecified: Secondary | ICD-10-CM | POA: Diagnosis not present

## 2021-10-30 DIAGNOSIS — F0390 Unspecified dementia without behavioral disturbance: Secondary | ICD-10-CM | POA: Diagnosis not present

## 2021-10-30 DIAGNOSIS — J302 Other seasonal allergic rhinitis: Secondary | ICD-10-CM | POA: Diagnosis not present

## 2021-10-30 DIAGNOSIS — U071 COVID-19: Secondary | ICD-10-CM | POA: Diagnosis not present

## 2021-10-30 DIAGNOSIS — E559 Vitamin D deficiency, unspecified: Secondary | ICD-10-CM | POA: Diagnosis not present

## 2021-10-30 NOTE — Telephone Encounter (Signed)
I called Kathleen Gallegos daughter [name] for update on PC visit and f/u plan as discussed with [employer]. No answer, message left with contact information ?

## 2021-10-31 ENCOUNTER — Telehealth: Payer: Self-pay | Admitting: Nurse Practitioner

## 2021-10-31 NOTE — Telephone Encounter (Signed)
Kathleen Griffins, Kathleen Winthrow's daughter called, updated on PC visit. We talked about pending hospice av visit and that until the av visit happens we won't know if Kathleen Gallegos is eligible for hospice services. Discussed with Kathleen Gallegos if Kathleen. Winthrow is eligible if wishes are to proceed with hospice. If not eligible, discussed will stay on PC and order [place]. Kathleen Gallegos talked about family having multiple plans on how to problem solve caring for Kathleen. Winthrow at home with increase in skill level. Emotional support provided.  ? ?Total time 15 minutes ?Documentation 5 minutes ?Phone discussion 10 minutes ?

## 2021-11-01 ENCOUNTER — Telehealth: Payer: Self-pay

## 2021-11-01 NOTE — Telephone Encounter (Signed)
Incoming call received from patients daughter stating patient will be released from [facility] and [facility] tomorrow at 11 am. Patient is also scheduled to have an evaluation from Hospice tomorrow at 2 pm. ? ?[name] was calling only to inform [place]. [name] stated she also wanted [name] to know patients weight: 107 lbs, due to weight loss prior to going into rehab center.  ? ? ? ?

## 2021-11-01 NOTE — Telephone Encounter (Signed)
Noted  

## 2021-11-02 ENCOUNTER — Telehealth: Payer: Self-pay | Admitting: *Deleted

## 2021-11-02 DIAGNOSIS — Z8616 Personal history of COVID-19: Secondary | ICD-10-CM | POA: Diagnosis not present

## 2021-11-02 DIAGNOSIS — G309 Alzheimer's disease, unspecified: Secondary | ICD-10-CM | POA: Diagnosis not present

## 2021-11-02 DIAGNOSIS — F02811 Dementia in other diseases classified elsewhere, unspecified severity, with agitation: Secondary | ICD-10-CM | POA: Diagnosis not present

## 2021-11-02 DIAGNOSIS — E785 Hyperlipidemia, unspecified: Secondary | ICD-10-CM | POA: Diagnosis not present

## 2021-11-02 DIAGNOSIS — F0283 Dementia in other diseases classified elsewhere, unspecified severity, with mood disturbance: Secondary | ICD-10-CM | POA: Diagnosis not present

## 2021-11-02 DIAGNOSIS — J302 Other seasonal allergic rhinitis: Secondary | ICD-10-CM | POA: Diagnosis not present

## 2021-11-02 DIAGNOSIS — D696 Thrombocytopenia, unspecified: Secondary | ICD-10-CM | POA: Diagnosis not present

## 2021-11-02 NOTE — Telephone Encounter (Signed)
Amber with [facility] called and stated that they are admitting patient into Hospice Services.  ? ?Requesting [name] to be Hospice Attending.  ?Please Advise.  ?

## 2021-11-03 ENCOUNTER — Telehealth: Payer: Self-pay

## 2021-11-03 DIAGNOSIS — D696 Thrombocytopenia, unspecified: Secondary | ICD-10-CM | POA: Diagnosis not present

## 2021-11-03 DIAGNOSIS — F0283 Dementia in other diseases classified elsewhere, unspecified severity, with mood disturbance: Secondary | ICD-10-CM | POA: Diagnosis not present

## 2021-11-03 DIAGNOSIS — G309 Alzheimer's disease, unspecified: Secondary | ICD-10-CM | POA: Diagnosis not present

## 2021-11-03 DIAGNOSIS — F02811 Dementia in other diseases classified elsewhere, unspecified severity, with agitation: Secondary | ICD-10-CM | POA: Diagnosis not present

## 2021-11-03 DIAGNOSIS — Z8616 Personal history of COVID-19: Secondary | ICD-10-CM | POA: Diagnosis not present

## 2021-11-03 DIAGNOSIS — E785 Hyperlipidemia, unspecified: Secondary | ICD-10-CM | POA: Diagnosis not present

## 2021-11-03 NOTE — Telephone Encounter (Signed)
Noted  

## 2021-11-03 NOTE — Telephone Encounter (Signed)
See previous message replied to [facility].will continue to be PCP.Yes patient has had a decline in condition.  ?

## 2021-11-03 NOTE — Telephone Encounter (Signed)
Christy with [employer] and agreed.  ?

## 2021-11-03 NOTE — Telephone Encounter (Signed)
Called and notified them again. They apologized for the confusion. Message routed back to PCP Ngetich, [name] C, NP . No further action is required.  ?

## 2021-11-03 NOTE — Telephone Encounter (Signed)
Chariah Bailey from [facility] called and wants to know if you will be attending physician for [facility]? She wants to know if you believe patient has 6 months or less to live? Message routed to PCP Ngetich, Nelda Bucks, NP .  ?

## 2021-11-03 NOTE — Telephone Encounter (Signed)
Will continue to be patient's attending. ?

## 2021-11-04 DIAGNOSIS — G309 Alzheimer's disease, unspecified: Secondary | ICD-10-CM | POA: Diagnosis not present

## 2021-11-04 DIAGNOSIS — E785 Hyperlipidemia, unspecified: Secondary | ICD-10-CM | POA: Diagnosis not present

## 2021-11-04 DIAGNOSIS — D696 Thrombocytopenia, unspecified: Secondary | ICD-10-CM | POA: Diagnosis not present

## 2021-11-04 DIAGNOSIS — F02811 Dementia in other diseases classified elsewhere, unspecified severity, with agitation: Secondary | ICD-10-CM | POA: Diagnosis not present

## 2021-11-04 DIAGNOSIS — F0283 Dementia in other diseases classified elsewhere, unspecified severity, with mood disturbance: Secondary | ICD-10-CM | POA: Diagnosis not present

## 2021-11-04 DIAGNOSIS — Z8616 Personal history of COVID-19: Secondary | ICD-10-CM | POA: Diagnosis not present

## 2021-11-06 DIAGNOSIS — D696 Thrombocytopenia, unspecified: Secondary | ICD-10-CM | POA: Diagnosis not present

## 2021-11-06 DIAGNOSIS — G309 Alzheimer's disease, unspecified: Secondary | ICD-10-CM | POA: Diagnosis not present

## 2021-11-06 DIAGNOSIS — Z8616 Personal history of COVID-19: Secondary | ICD-10-CM | POA: Diagnosis not present

## 2021-11-06 DIAGNOSIS — F0283 Dementia in other diseases classified elsewhere, unspecified severity, with mood disturbance: Secondary | ICD-10-CM | POA: Diagnosis not present

## 2021-11-06 DIAGNOSIS — F02811 Dementia in other diseases classified elsewhere, unspecified severity, with agitation: Secondary | ICD-10-CM | POA: Diagnosis not present

## 2021-11-06 DIAGNOSIS — E785 Hyperlipidemia, unspecified: Secondary | ICD-10-CM | POA: Diagnosis not present

## 2021-11-06 DIAGNOSIS — J302 Other seasonal allergic rhinitis: Secondary | ICD-10-CM | POA: Diagnosis not present

## 2021-11-07 DIAGNOSIS — G309 Alzheimer's disease, unspecified: Secondary | ICD-10-CM | POA: Diagnosis not present

## 2021-11-07 DIAGNOSIS — E785 Hyperlipidemia, unspecified: Secondary | ICD-10-CM | POA: Diagnosis not present

## 2021-11-07 DIAGNOSIS — F0283 Dementia in other diseases classified elsewhere, unspecified severity, with mood disturbance: Secondary | ICD-10-CM | POA: Diagnosis not present

## 2021-11-07 DIAGNOSIS — Z8616 Personal history of COVID-19: Secondary | ICD-10-CM | POA: Diagnosis not present

## 2021-11-07 DIAGNOSIS — F02811 Dementia in other diseases classified elsewhere, unspecified severity, with agitation: Secondary | ICD-10-CM | POA: Diagnosis not present

## 2021-11-07 DIAGNOSIS — D696 Thrombocytopenia, unspecified: Secondary | ICD-10-CM | POA: Diagnosis not present

## 2021-11-08 ENCOUNTER — Telehealth: Payer: Self-pay

## 2021-11-08 DIAGNOSIS — E785 Hyperlipidemia, unspecified: Secondary | ICD-10-CM | POA: Diagnosis not present

## 2021-11-08 DIAGNOSIS — G309 Alzheimer's disease, unspecified: Secondary | ICD-10-CM | POA: Diagnosis not present

## 2021-11-08 DIAGNOSIS — F02811 Dementia in other diseases classified elsewhere, unspecified severity, with agitation: Secondary | ICD-10-CM | POA: Diagnosis not present

## 2021-11-08 DIAGNOSIS — D696 Thrombocytopenia, unspecified: Secondary | ICD-10-CM | POA: Diagnosis not present

## 2021-11-08 DIAGNOSIS — F0283 Dementia in other diseases classified elsewhere, unspecified severity, with mood disturbance: Secondary | ICD-10-CM | POA: Diagnosis not present

## 2021-11-08 DIAGNOSIS — F0390 Unspecified dementia without behavioral disturbance: Secondary | ICD-10-CM

## 2021-11-08 DIAGNOSIS — Z8616 Personal history of COVID-19: Secondary | ICD-10-CM | POA: Diagnosis not present

## 2021-11-08 DIAGNOSIS — F411 Generalized anxiety disorder: Secondary | ICD-10-CM

## 2021-11-08 MED ORDER — SERTRALINE HCL 20 MG/ML PO CONC: 50.0000 mg | Freq: Every day | ORAL | 12 refills | Status: AC

## 2021-11-08 NOTE — Telephone Encounter (Signed)
Patient daughter "Malay Fantroy" is agreeable to medication changes. Please send in new prescription for Zoloft liquid to [facility] in [place], [place]. Message routed back to PCP Ngetich, [name] C, NP.  ?

## 2021-11-08 NOTE — Telephone Encounter (Signed)
Patient daughter Kathleen Gallegos complains that patient has been spitting out or holding on to Zoloft in mouth. She states that  it's been 4 days that patient has been having difficulty with medication. Is there a liquid form of Zoloft that can be sent in to pharmacy? Message routed to PCP Ngetich, Nelda Bucks, NP.  ?

## 2021-11-08 NOTE — Telephone Encounter (Signed)
-   discontinue Sertraline 50 mg tablet  ? ?- Sertraline 20 mg / ml conc.solution Take 2.5 mls ( 50 mg) by mouth once daily.  ?

## 2021-11-09 ENCOUNTER — Ambulatory Visit: Payer: Medicare Other | Admitting: Family

## 2021-11-09 DIAGNOSIS — F0283 Dementia in other diseases classified elsewhere, unspecified severity, with mood disturbance: Secondary | ICD-10-CM | POA: Diagnosis not present

## 2021-11-09 DIAGNOSIS — F02811 Dementia in other diseases classified elsewhere, unspecified severity, with agitation: Secondary | ICD-10-CM | POA: Diagnosis not present

## 2021-11-09 DIAGNOSIS — E785 Hyperlipidemia, unspecified: Secondary | ICD-10-CM | POA: Diagnosis not present

## 2021-11-09 DIAGNOSIS — G309 Alzheimer's disease, unspecified: Secondary | ICD-10-CM | POA: Diagnosis not present

## 2021-11-09 DIAGNOSIS — Z8616 Personal history of COVID-19: Secondary | ICD-10-CM | POA: Diagnosis not present

## 2021-11-09 DIAGNOSIS — D696 Thrombocytopenia, unspecified: Secondary | ICD-10-CM | POA: Diagnosis not present

## 2021-11-10 DIAGNOSIS — G309 Alzheimer's disease, unspecified: Secondary | ICD-10-CM | POA: Diagnosis not present

## 2021-11-10 DIAGNOSIS — E785 Hyperlipidemia, unspecified: Secondary | ICD-10-CM | POA: Diagnosis not present

## 2021-11-10 DIAGNOSIS — F02811 Dementia in other diseases classified elsewhere, unspecified severity, with agitation: Secondary | ICD-10-CM | POA: Diagnosis not present

## 2021-11-10 DIAGNOSIS — D696 Thrombocytopenia, unspecified: Secondary | ICD-10-CM | POA: Diagnosis not present

## 2021-11-10 DIAGNOSIS — Z8616 Personal history of COVID-19: Secondary | ICD-10-CM | POA: Diagnosis not present

## 2021-11-10 DIAGNOSIS — F0283 Dementia in other diseases classified elsewhere, unspecified severity, with mood disturbance: Secondary | ICD-10-CM | POA: Diagnosis not present

## 2021-11-13 DIAGNOSIS — F0283 Dementia in other diseases classified elsewhere, unspecified severity, with mood disturbance: Secondary | ICD-10-CM | POA: Diagnosis not present

## 2021-11-13 DIAGNOSIS — E785 Hyperlipidemia, unspecified: Secondary | ICD-10-CM | POA: Diagnosis not present

## 2021-11-13 DIAGNOSIS — F02811 Dementia in other diseases classified elsewhere, unspecified severity, with agitation: Secondary | ICD-10-CM | POA: Diagnosis not present

## 2021-11-13 DIAGNOSIS — Z8616 Personal history of COVID-19: Secondary | ICD-10-CM | POA: Diagnosis not present

## 2021-11-13 DIAGNOSIS — D696 Thrombocytopenia, unspecified: Secondary | ICD-10-CM | POA: Diagnosis not present

## 2021-11-13 DIAGNOSIS — G309 Alzheimer's disease, unspecified: Secondary | ICD-10-CM | POA: Diagnosis not present

## 2021-11-14 DIAGNOSIS — D696 Thrombocytopenia, unspecified: Secondary | ICD-10-CM | POA: Diagnosis not present

## 2021-11-14 DIAGNOSIS — E785 Hyperlipidemia, unspecified: Secondary | ICD-10-CM | POA: Diagnosis not present

## 2021-11-14 DIAGNOSIS — Z8616 Personal history of COVID-19: Secondary | ICD-10-CM | POA: Diagnosis not present

## 2021-11-14 DIAGNOSIS — G309 Alzheimer's disease, unspecified: Secondary | ICD-10-CM | POA: Diagnosis not present

## 2021-11-14 DIAGNOSIS — F0283 Dementia in other diseases classified elsewhere, unspecified severity, with mood disturbance: Secondary | ICD-10-CM | POA: Diagnosis not present

## 2021-11-14 DIAGNOSIS — F02811 Dementia in other diseases classified elsewhere, unspecified severity, with agitation: Secondary | ICD-10-CM | POA: Diagnosis not present

## 2021-11-15 DIAGNOSIS — D696 Thrombocytopenia, unspecified: Secondary | ICD-10-CM | POA: Diagnosis not present

## 2021-11-15 DIAGNOSIS — F0283 Dementia in other diseases classified elsewhere, unspecified severity, with mood disturbance: Secondary | ICD-10-CM | POA: Diagnosis not present

## 2021-11-15 DIAGNOSIS — E785 Hyperlipidemia, unspecified: Secondary | ICD-10-CM | POA: Diagnosis not present

## 2021-11-15 DIAGNOSIS — Z8616 Personal history of COVID-19: Secondary | ICD-10-CM | POA: Diagnosis not present

## 2021-11-15 DIAGNOSIS — G309 Alzheimer's disease, unspecified: Secondary | ICD-10-CM | POA: Diagnosis not present

## 2021-11-15 DIAGNOSIS — F02811 Dementia in other diseases classified elsewhere, unspecified severity, with agitation: Secondary | ICD-10-CM | POA: Diagnosis not present

## 2021-11-16 DIAGNOSIS — G309 Alzheimer's disease, unspecified: Secondary | ICD-10-CM | POA: Diagnosis not present

## 2021-11-16 DIAGNOSIS — F02811 Dementia in other diseases classified elsewhere, unspecified severity, with agitation: Secondary | ICD-10-CM | POA: Diagnosis not present

## 2021-11-16 DIAGNOSIS — E785 Hyperlipidemia, unspecified: Secondary | ICD-10-CM | POA: Diagnosis not present

## 2021-11-16 DIAGNOSIS — F0283 Dementia in other diseases classified elsewhere, unspecified severity, with mood disturbance: Secondary | ICD-10-CM | POA: Diagnosis not present

## 2021-11-16 DIAGNOSIS — D696 Thrombocytopenia, unspecified: Secondary | ICD-10-CM | POA: Diagnosis not present

## 2021-11-16 DIAGNOSIS — Z8616 Personal history of COVID-19: Secondary | ICD-10-CM | POA: Diagnosis not present

## 2021-11-17 DIAGNOSIS — D696 Thrombocytopenia, unspecified: Secondary | ICD-10-CM | POA: Diagnosis not present

## 2021-11-17 DIAGNOSIS — F02811 Dementia in other diseases classified elsewhere, unspecified severity, with agitation: Secondary | ICD-10-CM | POA: Diagnosis not present

## 2021-11-17 DIAGNOSIS — Z8616 Personal history of COVID-19: Secondary | ICD-10-CM | POA: Diagnosis not present

## 2021-11-17 DIAGNOSIS — E785 Hyperlipidemia, unspecified: Secondary | ICD-10-CM | POA: Diagnosis not present

## 2021-11-17 DIAGNOSIS — F0283 Dementia in other diseases classified elsewhere, unspecified severity, with mood disturbance: Secondary | ICD-10-CM | POA: Diagnosis not present

## 2021-11-17 DIAGNOSIS — G309 Alzheimer's disease, unspecified: Secondary | ICD-10-CM | POA: Diagnosis not present

## 2021-11-20 DIAGNOSIS — F02811 Dementia in other diseases classified elsewhere, unspecified severity, with agitation: Secondary | ICD-10-CM | POA: Diagnosis not present

## 2021-11-20 DIAGNOSIS — Z8616 Personal history of COVID-19: Secondary | ICD-10-CM | POA: Diagnosis not present

## 2021-11-20 DIAGNOSIS — G309 Alzheimer's disease, unspecified: Secondary | ICD-10-CM | POA: Diagnosis not present

## 2021-11-20 DIAGNOSIS — F0283 Dementia in other diseases classified elsewhere, unspecified severity, with mood disturbance: Secondary | ICD-10-CM | POA: Diagnosis not present

## 2021-11-20 DIAGNOSIS — E785 Hyperlipidemia, unspecified: Secondary | ICD-10-CM | POA: Diagnosis not present

## 2021-11-20 DIAGNOSIS — D696 Thrombocytopenia, unspecified: Secondary | ICD-10-CM | POA: Diagnosis not present

## 2021-11-21 DIAGNOSIS — D696 Thrombocytopenia, unspecified: Secondary | ICD-10-CM | POA: Diagnosis not present

## 2021-11-21 DIAGNOSIS — F02811 Dementia in other diseases classified elsewhere, unspecified severity, with agitation: Secondary | ICD-10-CM | POA: Diagnosis not present

## 2021-11-21 DIAGNOSIS — E785 Hyperlipidemia, unspecified: Secondary | ICD-10-CM | POA: Diagnosis not present

## 2021-11-21 DIAGNOSIS — F0283 Dementia in other diseases classified elsewhere, unspecified severity, with mood disturbance: Secondary | ICD-10-CM | POA: Diagnosis not present

## 2021-11-21 DIAGNOSIS — Z8616 Personal history of COVID-19: Secondary | ICD-10-CM | POA: Diagnosis not present

## 2021-11-21 DIAGNOSIS — G309 Alzheimer's disease, unspecified: Secondary | ICD-10-CM | POA: Diagnosis not present

## 2021-11-22 ENCOUNTER — Ambulatory Visit: Payer: Medicare Other | Admitting: Family

## 2021-11-22 DIAGNOSIS — D696 Thrombocytopenia, unspecified: Secondary | ICD-10-CM | POA: Diagnosis not present

## 2021-11-22 DIAGNOSIS — G309 Alzheimer's disease, unspecified: Secondary | ICD-10-CM | POA: Diagnosis not present

## 2021-11-22 DIAGNOSIS — F0283 Dementia in other diseases classified elsewhere, unspecified severity, with mood disturbance: Secondary | ICD-10-CM | POA: Diagnosis not present

## 2021-11-22 DIAGNOSIS — E785 Hyperlipidemia, unspecified: Secondary | ICD-10-CM | POA: Diagnosis not present

## 2021-11-22 DIAGNOSIS — Z8616 Personal history of COVID-19: Secondary | ICD-10-CM | POA: Diagnosis not present

## 2021-11-22 DIAGNOSIS — F02811 Dementia in other diseases classified elsewhere, unspecified severity, with agitation: Secondary | ICD-10-CM | POA: Diagnosis not present

## 2021-11-23 DIAGNOSIS — F02811 Dementia in other diseases classified elsewhere, unspecified severity, with agitation: Secondary | ICD-10-CM | POA: Diagnosis not present

## 2021-11-23 DIAGNOSIS — G309 Alzheimer's disease, unspecified: Secondary | ICD-10-CM | POA: Diagnosis not present

## 2021-11-23 DIAGNOSIS — D696 Thrombocytopenia, unspecified: Secondary | ICD-10-CM | POA: Diagnosis not present

## 2021-11-23 DIAGNOSIS — Z8616 Personal history of COVID-19: Secondary | ICD-10-CM | POA: Diagnosis not present

## 2021-11-23 DIAGNOSIS — E785 Hyperlipidemia, unspecified: Secondary | ICD-10-CM | POA: Diagnosis not present

## 2021-11-23 DIAGNOSIS — F0283 Dementia in other diseases classified elsewhere, unspecified severity, with mood disturbance: Secondary | ICD-10-CM | POA: Diagnosis not present

## 2021-11-24 DIAGNOSIS — F02811 Dementia in other diseases classified elsewhere, unspecified severity, with agitation: Secondary | ICD-10-CM | POA: Diagnosis not present

## 2021-11-24 DIAGNOSIS — G309 Alzheimer's disease, unspecified: Secondary | ICD-10-CM | POA: Diagnosis not present

## 2021-11-24 DIAGNOSIS — Z8616 Personal history of COVID-19: Secondary | ICD-10-CM | POA: Diagnosis not present

## 2021-11-24 DIAGNOSIS — F0283 Dementia in other diseases classified elsewhere, unspecified severity, with mood disturbance: Secondary | ICD-10-CM | POA: Diagnosis not present

## 2021-11-24 DIAGNOSIS — D696 Thrombocytopenia, unspecified: Secondary | ICD-10-CM | POA: Diagnosis not present

## 2021-11-24 DIAGNOSIS — E785 Hyperlipidemia, unspecified: Secondary | ICD-10-CM | POA: Diagnosis not present

## 2021-11-27 DIAGNOSIS — D696 Thrombocytopenia, unspecified: Secondary | ICD-10-CM | POA: Diagnosis not present

## 2021-11-27 DIAGNOSIS — E785 Hyperlipidemia, unspecified: Secondary | ICD-10-CM | POA: Diagnosis not present

## 2021-11-27 DIAGNOSIS — F02811 Dementia in other diseases classified elsewhere, unspecified severity, with agitation: Secondary | ICD-10-CM | POA: Diagnosis not present

## 2021-11-27 DIAGNOSIS — F0283 Dementia in other diseases classified elsewhere, unspecified severity, with mood disturbance: Secondary | ICD-10-CM | POA: Diagnosis not present

## 2021-11-27 DIAGNOSIS — Z8616 Personal history of COVID-19: Secondary | ICD-10-CM | POA: Diagnosis not present

## 2021-11-27 DIAGNOSIS — G309 Alzheimer's disease, unspecified: Secondary | ICD-10-CM | POA: Diagnosis not present

## 2021-11-28 DIAGNOSIS — F02811 Dementia in other diseases classified elsewhere, unspecified severity, with agitation: Secondary | ICD-10-CM | POA: Diagnosis not present

## 2021-11-28 DIAGNOSIS — D696 Thrombocytopenia, unspecified: Secondary | ICD-10-CM | POA: Diagnosis not present

## 2021-11-28 DIAGNOSIS — E785 Hyperlipidemia, unspecified: Secondary | ICD-10-CM | POA: Diagnosis not present

## 2021-11-28 DIAGNOSIS — G309 Alzheimer's disease, unspecified: Secondary | ICD-10-CM | POA: Diagnosis not present

## 2021-11-28 DIAGNOSIS — F0283 Dementia in other diseases classified elsewhere, unspecified severity, with mood disturbance: Secondary | ICD-10-CM | POA: Diagnosis not present

## 2021-11-28 DIAGNOSIS — Z8616 Personal history of COVID-19: Secondary | ICD-10-CM | POA: Diagnosis not present

## 2021-11-29 DIAGNOSIS — F02811 Dementia in other diseases classified elsewhere, unspecified severity, with agitation: Secondary | ICD-10-CM | POA: Diagnosis not present

## 2021-11-29 DIAGNOSIS — F0283 Dementia in other diseases classified elsewhere, unspecified severity, with mood disturbance: Secondary | ICD-10-CM | POA: Diagnosis not present

## 2021-11-29 DIAGNOSIS — D696 Thrombocytopenia, unspecified: Secondary | ICD-10-CM | POA: Diagnosis not present

## 2021-11-29 DIAGNOSIS — Z8616 Personal history of COVID-19: Secondary | ICD-10-CM | POA: Diagnosis not present

## 2021-11-29 DIAGNOSIS — G309 Alzheimer's disease, unspecified: Secondary | ICD-10-CM | POA: Diagnosis not present

## 2021-11-29 DIAGNOSIS — E785 Hyperlipidemia, unspecified: Secondary | ICD-10-CM | POA: Diagnosis not present

## 2021-11-30 DIAGNOSIS — G309 Alzheimer's disease, unspecified: Secondary | ICD-10-CM | POA: Diagnosis not present

## 2021-11-30 DIAGNOSIS — E785 Hyperlipidemia, unspecified: Secondary | ICD-10-CM | POA: Diagnosis not present

## 2021-11-30 DIAGNOSIS — Z8616 Personal history of COVID-19: Secondary | ICD-10-CM | POA: Diagnosis not present

## 2021-11-30 DIAGNOSIS — F02811 Dementia in other diseases classified elsewhere, unspecified severity, with agitation: Secondary | ICD-10-CM | POA: Diagnosis not present

## 2021-11-30 DIAGNOSIS — F0283 Dementia in other diseases classified elsewhere, unspecified severity, with mood disturbance: Secondary | ICD-10-CM | POA: Diagnosis not present

## 2021-11-30 DIAGNOSIS — D696 Thrombocytopenia, unspecified: Secondary | ICD-10-CM | POA: Diagnosis not present

## 2021-12-01 DIAGNOSIS — Z8616 Personal history of COVID-19: Secondary | ICD-10-CM | POA: Diagnosis not present

## 2021-12-01 DIAGNOSIS — E785 Hyperlipidemia, unspecified: Secondary | ICD-10-CM | POA: Diagnosis not present

## 2021-12-01 DIAGNOSIS — G309 Alzheimer's disease, unspecified: Secondary | ICD-10-CM | POA: Diagnosis not present

## 2021-12-01 DIAGNOSIS — F0283 Dementia in other diseases classified elsewhere, unspecified severity, with mood disturbance: Secondary | ICD-10-CM | POA: Diagnosis not present

## 2021-12-01 DIAGNOSIS — D696 Thrombocytopenia, unspecified: Secondary | ICD-10-CM | POA: Diagnosis not present

## 2021-12-01 DIAGNOSIS — F02811 Dementia in other diseases classified elsewhere, unspecified severity, with agitation: Secondary | ICD-10-CM | POA: Diagnosis not present

## 2021-12-02 ENCOUNTER — Other Ambulatory Visit: Payer: Self-pay

## 2021-12-02 ENCOUNTER — Encounter (HOSPITAL_COMMUNITY): Payer: Self-pay

## 2021-12-02 ENCOUNTER — Emergency Department (HOSPITAL_COMMUNITY)
Admission: EM | Admit: 2021-12-02 | Discharge: 2021-12-02 | Disposition: A | Discharge status: Home / Self Care | Attending: Emergency Medicine | Admitting: Emergency Medicine

## 2021-12-02 DIAGNOSIS — F0390 Unspecified dementia without behavioral disturbance: Secondary | ICD-10-CM | POA: Diagnosis not present

## 2021-12-02 DIAGNOSIS — Z743 Need for continuous supervision: Secondary | ICD-10-CM | POA: Diagnosis not present

## 2021-12-02 DIAGNOSIS — N3000 Acute cystitis without hematuria: Secondary | ICD-10-CM | POA: Insufficient documentation

## 2021-12-02 DIAGNOSIS — R279 Unspecified lack of coordination: Secondary | ICD-10-CM | POA: Diagnosis not present

## 2021-12-02 DIAGNOSIS — R531 Weakness: Secondary | ICD-10-CM | POA: Diagnosis not present

## 2021-12-02 LAB — CBC WITH DIFFERENTIAL/PLATELET
Abs Immature Granulocytes: 0.01 10*3/uL (ref 0.00–0.07)
Basophils Absolute: 0 10*3/uL (ref 0.0–0.1)
Basophils Relative: 0 %
Eosinophils Absolute: 0.1 10*3/uL (ref 0.0–0.5)
Eosinophils Relative: 1 %
HCT: 38.8 % (ref 36.0–46.0)
Hemoglobin: 12.5 g/dL (ref 12.0–15.0)
Immature Granulocytes: 0 %
Lymphocytes Relative: 30 %
Lymphs Abs: 1.5 10*3/uL (ref 0.7–4.0)
MCH: 31.3 pg (ref 26.0–34.0)
MCHC: 32.2 g/dL (ref 30.0–36.0)
MCV: 97 fL (ref 80.0–100.0)
Monocytes Absolute: 0.6 10*3/uL (ref 0.1–1.0)
Monocytes Relative: 12 %
Neutro Abs: 2.9 10*3/uL (ref 1.7–7.7)
Neutrophils Relative %: 57 %
Platelets: 151 10*3/uL (ref 150–400)
RBC: 4 MIL/uL (ref 3.87–5.11)
RDW: 14.7 % (ref 11.5–15.5)
WBC: 5.1 10*3/uL (ref 4.0–10.5)
nRBC: 0 % (ref 0.0–0.2)

## 2021-12-02 LAB — URINALYSIS, ROUTINE W REFLEX MICROSCOPIC
Bilirubin Urine: NEGATIVE
Glucose, UA: NEGATIVE mg/dL
Ketones, ur: 5 mg/dL — AB
Nitrite: NEGATIVE
Protein, ur: 100 mg/dL — AB
Specific Gravity, Urine: 1.017 (ref 1.005–1.030)
WBC, UA: >50 WBC/hpf — ABNORMAL HIGH (ref 0–5)
pH: 7 (ref 5.0–8.0)

## 2021-12-02 LAB — BASIC METABOLIC PANEL
Anion gap: 5 (ref 5–15)
BUN: 41 mg/dL — ABNORMAL HIGH (ref 8–23)
CO2: 27 mmol/L (ref 22–32)
Calcium: 9.7 mg/dL (ref 8.9–10.3)
Chloride: 112 mmol/L — ABNORMAL HIGH (ref 98–111)
Creatinine, Ser: 0.96 mg/dL (ref 0.44–1.00)
GFR, Estimated: 55 mL/min — ABNORMAL LOW (ref >60)
Glucose, Bld: 106 mg/dL — ABNORMAL HIGH (ref 70–99)
Potassium: 4.3 mmol/L (ref 3.5–5.1)
Sodium: 144 mmol/L (ref 135–145)

## 2021-12-02 MED ORDER — SODIUM CHLORIDE 0.9 % IV BOLUS
1000.0000 mL | Freq: Once | INTRAVENOUS | Status: AC
Start: 2021-12-02 — End: 2021-12-02
  Administered 2021-12-02: 1000 mL via INTRAVENOUS

## 2021-12-02 MED ORDER — SODIUM CHLORIDE 0.9 % IV SOLN
2.0000 g | Freq: Once | INTRAVENOUS | Status: AC
  Administered 2021-12-02: 2 g via INTRAVENOUS
  Filled 2021-12-02: qty 20

## 2021-12-02 MED ORDER — SULFAMETHOXAZOLE-TRIMETHOPRIM 800-160 MG PO TABS: 1.0000 | ORAL_TABLET | Freq: Two times a day (BID) | ORAL | 0 refills | Status: AC

## 2021-12-02 NOTE — Discharge Instructions (Addendum)
Drink plenty of fluids.  Make sure she takes the antibiotic for a week.

## 2021-12-02 NOTE — ED Triage Notes (Addendum)
Pt brought here by EMS from home per request of daughter, EMS says pt reports feeling "fine with no complaints", but daughter says pt isn't eating like she should and urine has foul smell. Hospice pt since April 2023, pt had Covid 10/02/21.

## 2021-12-02 NOTE — ED Provider Notes (Signed)
[facility] Provider Note   CSN: [id number] Arrival date & time: 12/02/21  2036     History {Add pertinent medical, surgical, social history, OB history to HPI:1} Chief Complaint  Patient presents with   generalized weakness    Covid + 10/04/22 -hospice pt since April 2023    Kathleen Gallegos is a 88 y.o. female.  Patient is 88 years old.  She has dementia and is on hospice.  She has not been eating or drinking much over the last week   Weakness     Home Medications Prior to Admission medications   Medication Sig Start Date End Date Taking? Authorizing Provider  sulfamethoxazole-trimethoprim (BACTRIM DS) 800-160 MG tablet Take 1 tablet by mouth 2 (two) times daily for 7 days. 12/02/21 12/09/21 Yes Milton Ferguson, MD  acetaminophen (TYLENOL) 325 MG tablet Take 2 tablets (650 mg total) by mouth every 6 (six) hours as needed for mild pain, moderate pain or fever. 10/05/21   Johnson, Clanford L, MD  CRANBERRY PO Take 25 mg by mouth daily.    [provider]  fexofenadine (ALLEGRA) 180 MG tablet Take 0.5 tablets (90 mg total) by mouth as needed for allergies or rhinitis. 10/05/21   Johnson, Clanford L, MD  fluticasone (FLONASE) 50 MCG/ACT nasal spray Place 1 spray into both nostrils as needed.    [provider]  Melatonin 10 MG TABS Take 10 mg by mouth daily.    [provider]  metoprolol succinate (TOPROL-XL) 25 MG 24 hr tablet Take 1 tablet by mouth once daily 06/20/21   Ngetich, [name] C, NP  polyethylene glycol powder (GLYCOLAX/MIRALAX) 17 GM/SCOOP powder Take 17 g by mouth daily. Hold for loose stool 06/16/20   Ngetich, [name] C, NP  sertraline (ZOLOFT) 20 MG/ML concentrated solution Take 2.5 mLs (50 mg total) by mouth daily. 11/08/21   Ngetich, [name] C, NP      Allergies    Diphenhydramine hcl and Rivastigmine tartrate    Review of Systems   Review of Systems  Neurological:  Positive for weakness.   Physical Exam Updated Vital  Signs BP 95/76   Pulse 88   Resp (!) 21   Ht '5\' 6"'$  (1.676 m)   Wt 49.2 kg   SpO2 97%   BMI 17.51 kg/m  Physical Exam  ED Results / Procedures / Treatments   Labs (all labs ordered are listed, but only abnormal results are displayed) Labs Reviewed  BASIC METABOLIC PANEL - Abnormal; Notable for the following components:      Result Value   Chloride 112 (*)    Glucose, Bld 106 (*)    BUN 41 (*)    GFR, Estimated 55 (*)    All other components within normal limits  URINALYSIS, ROUTINE W REFLEX MICROSCOPIC - Abnormal; Notable for the following components:   Color, Urine AMBER (*)    APPearance CLOUDY (*)    Hgb urine dipstick MODERATE (*)    Ketones, ur 5 (*)    Protein, ur 100 (*)    Leukocytes,Ua SMALL (*)    WBC, UA >50 (*)    Bacteria, UA FEW (*)    All other components within normal limits  CBC WITH DIFFERENTIAL/PLATELET    EKG None  Radiology No results found.  Procedures Procedures  {Document cardiac monitor, telemetry assessment procedure when appropriate:1}  Medications Ordered in ED Medications  cefTRIAXone (ROCEPHIN) 2 g in sodium chloride 0.9 % 100 mL IVPB (has no administration in time  range)  sodium chloride 0.9 % bolus 1,000 mL (1,000 mLs Intravenous New Bag/Given 12/02/21 2150)    ED Course/ Medical Decision Making/ A&P                           Medical Decision Making Amount and/or Complexity of Data Reviewed Labs: ordered.  Risk Prescription drug management.   Patient with mild dehydration and urinary tract infection.  She was given 1 L of fluids IV and is sent home on Bactrim.  She is also given Rocephin here  {Document critical care time when appropriate:1} {Document review of labs and clinical decision tools ie heart score, Chads2Vasc2 etc:1}  {Document your independent review of radiology images, and any outside records:1} {Document your discussion with family members, caretakers, and with consultants:1} {Document social determinants  of health affecting pt's care:1} {Document your decision making why or why not admission, treatments were needed:1} Final Clinical Impression(s) / ED Diagnoses Final diagnoses:  Acute cystitis without hematuria    Rx / DC Orders ED Discharge Orders          Ordered    sulfamethoxazole-trimethoprim (BACTRIM DS) 800-160 MG tablet  2 times daily        12/02/21 2228

## 2021-12-04 DIAGNOSIS — D696 Thrombocytopenia, unspecified: Secondary | ICD-10-CM | POA: Diagnosis not present

## 2021-12-04 DIAGNOSIS — Z8616 Personal history of COVID-19: Secondary | ICD-10-CM | POA: Diagnosis not present

## 2021-12-04 DIAGNOSIS — G309 Alzheimer's disease, unspecified: Secondary | ICD-10-CM | POA: Diagnosis not present

## 2021-12-04 DIAGNOSIS — F0283 Dementia in other diseases classified elsewhere, unspecified severity, with mood disturbance: Secondary | ICD-10-CM | POA: Diagnosis not present

## 2021-12-04 DIAGNOSIS — F02811 Dementia in other diseases classified elsewhere, unspecified severity, with agitation: Secondary | ICD-10-CM | POA: Diagnosis not present

## 2021-12-04 DIAGNOSIS — E785 Hyperlipidemia, unspecified: Secondary | ICD-10-CM | POA: Diagnosis not present

## 2021-12-05 DIAGNOSIS — F0283 Dementia in other diseases classified elsewhere, unspecified severity, with mood disturbance: Secondary | ICD-10-CM | POA: Diagnosis not present

## 2021-12-05 DIAGNOSIS — F02811 Dementia in other diseases classified elsewhere, unspecified severity, with agitation: Secondary | ICD-10-CM | POA: Diagnosis not present

## 2021-12-05 DIAGNOSIS — Z8616 Personal history of COVID-19: Secondary | ICD-10-CM | POA: Diagnosis not present

## 2021-12-05 DIAGNOSIS — E785 Hyperlipidemia, unspecified: Secondary | ICD-10-CM | POA: Diagnosis not present

## 2021-12-05 DIAGNOSIS — G309 Alzheimer's disease, unspecified: Secondary | ICD-10-CM | POA: Diagnosis not present

## 2021-12-05 DIAGNOSIS — D696 Thrombocytopenia, unspecified: Secondary | ICD-10-CM | POA: Diagnosis not present

## 2021-12-06 DIAGNOSIS — F02811 Dementia in other diseases classified elsewhere, unspecified severity, with agitation: Secondary | ICD-10-CM | POA: Diagnosis not present

## 2021-12-06 DIAGNOSIS — F0283 Dementia in other diseases classified elsewhere, unspecified severity, with mood disturbance: Secondary | ICD-10-CM | POA: Diagnosis not present

## 2021-12-06 DIAGNOSIS — D696 Thrombocytopenia, unspecified: Secondary | ICD-10-CM | POA: Diagnosis not present

## 2021-12-06 DIAGNOSIS — Z8616 Personal history of COVID-19: Secondary | ICD-10-CM | POA: Diagnosis not present

## 2021-12-06 DIAGNOSIS — G309 Alzheimer's disease, unspecified: Secondary | ICD-10-CM | POA: Diagnosis not present

## 2021-12-06 DIAGNOSIS — E785 Hyperlipidemia, unspecified: Secondary | ICD-10-CM | POA: Diagnosis not present

## 2021-12-07 DIAGNOSIS — G309 Alzheimer's disease, unspecified: Secondary | ICD-10-CM | POA: Diagnosis not present

## 2021-12-07 DIAGNOSIS — E785 Hyperlipidemia, unspecified: Secondary | ICD-10-CM | POA: Diagnosis not present

## 2021-12-07 DIAGNOSIS — J302 Other seasonal allergic rhinitis: Secondary | ICD-10-CM | POA: Diagnosis not present

## 2021-12-07 DIAGNOSIS — Z8616 Personal history of COVID-19: Secondary | ICD-10-CM | POA: Diagnosis not present

## 2021-12-07 DIAGNOSIS — D696 Thrombocytopenia, unspecified: Secondary | ICD-10-CM | POA: Diagnosis not present

## 2021-12-07 DIAGNOSIS — F0283 Dementia in other diseases classified elsewhere, unspecified severity, with mood disturbance: Secondary | ICD-10-CM | POA: Diagnosis not present

## 2021-12-07 DIAGNOSIS — F02811 Dementia in other diseases classified elsewhere, unspecified severity, with agitation: Secondary | ICD-10-CM | POA: Diagnosis not present

## 2021-12-14 ENCOUNTER — Encounter: Payer: Self-pay | Admitting: Nurse Practitioner

## 2021-12-14 ENCOUNTER — Telehealth (INDEPENDENT_AMBULATORY_CARE_PROVIDER_SITE_OTHER): Payer: Medicare Other | Admitting: Nurse Practitioner

## 2021-12-14 ENCOUNTER — Telehealth: Payer: Self-pay

## 2021-12-14 VITALS — Ht 65.0 in

## 2021-12-14 DIAGNOSIS — F0390 Unspecified dementia without behavioral disturbance: Secondary | ICD-10-CM | POA: Diagnosis not present

## 2021-12-14 NOTE — Progress Notes (Signed)
   This service is provided via telemedicine  No vital signs collected/recorded due to the encounter was a telemedicine visit.   Location of patient (ex: home, work):  Home  Patient consents to a telephone visit: Yes, see telephone visit dated 06/29/21  Location of the provider (ex: office, home):  [facility], Office   Name of any referring provider:  N/A  Names of all persons participating in the telemedicine service and their role in the encounter:  Earl Gala, Raymond, Sherrie Mustache, NP, and Patient , patients daughter [name]  Time spent on call:  9 min with medical assistant

## 2021-12-14 NOTE — Progress Notes (Signed)
Careteam: Patient Care Team: Ngetich, Nelda Bucks, NP as PCP - General (Family Medicine) Whitney Muse, Kelby Fam, MD (Inactive) as Consulting Physician (Hematology and Oncology) Rutherford Guys, MD as Consulting Physician (Ophthalmology) Cameron Sprang, MD as Consulting Physician (Neurology)  Advanced Directive information Does Patient Have a Medical Advance Directive?: Yes, Type of Advance Directive: Out of facility DNR (pink MOST or yellow form);[place], Does patient want to make changes to medical advance directive?: No - Patient declined  Allergies  Allergen Reactions   Diphenhydramine Hcl Other (See Comments)    unknown   Rivastigmine Tartrate Palpitations    Chief Complaint  Patient presents with   Acute Visit    Therapy order request via Hospice. Visit completed via video/telephone visit.      HPI: Patient is a 88 y.o. female with question on PT/OT.  April 24th she returned home from [place] skilled nursing home. She is currently under [facility] due to dementia.   Review of Systems:  ROS-dementia   Past Medical History:  Diagnosis Date   Allergic rhinitis, cause unspecified    Angioneurotic edema not elsewhere classified    Anxiety disorder    Asthma    Carotid bruit    Carpal tunnel syndrome    Chronic abdominal pain    Fecal incontinence    GERD (gastroesophageal reflux disease)    Hyperlipidemia    IBS (irritable bowel syndrome)    Insomnia    Lung nodule    Osteoarthritis    Palpitations    Pancytopenia ([place])    Senile osteoporosis    Past Surgical History:  Procedure Laterality Date   ABDOMINAL HYSTERECTOMY  1979   fibroids    bilateral cataract surgery  2008   BIOPSY THYROID  1970    COLONOSCOPY  12/21/04   normal -redundant    EUS  08/07/07   EYE SURGERY     Social History:   reports that she has never smoked. She has quit using smokeless tobacco.  Her smokeless tobacco use included snuff. She reports that she does not  currently use drugs. She reports that she does not drink alcohol.  Family History  Problem Relation Age of Onset   Hypertension Mother    Pancreatic cancer Father    Cancer Brother    Depression Daughter    Heart disease Sister    Leukemia Other        family history    Colon cancer Other        family history     Medications: Patient's Medications  New Prescriptions   No medications on file  Previous Medications   ACETAMINOPHEN (TYLENOL) 325 MG TABLET    Take 2 tablets (650 mg total) by mouth every 6 (six) hours as needed for mild pain, moderate pain or fever.   CRANBERRY PO    Take 25 mg by mouth daily.   FEXOFENADINE (ALLEGRA) 180 MG TABLET    Take 0.5 tablets (90 mg total) by mouth as needed for allergies or rhinitis.   FLUTICASONE (FLONASE) 50 MCG/ACT NASAL SPRAY    Place 1 spray into both nostrils as needed.   METOPROLOL SUCCINATE (TOPROL-XL) 25 MG 24 HR TABLET    Take 1 tablet by mouth once daily   POLYETHYLENE GLYCOL POWDER (GLYCOLAX/MIRALAX) 17 GM/SCOOP POWDER    Take 17 g by mouth daily. Hold for loose stool   SERTRALINE (ZOLOFT) 20 MG/ML CONCENTRATED SOLUTION    Take 2.5 mLs (50 mg total) by  mouth daily.  Modified Medications   No medications on file  Discontinued Medications   MELATONIN 10 MG TABS    Take 10 mg by mouth daily.    Physical Exam:  Vitals:   12/14/21 1443  Height: '5\' 5"'$  (1.651 m)   Body mass index is 18.05 kg/m. Wt Readings from Last 3 Encounters:  12/02/21 108 lb 7.5 oz (49.2 kg)  10/03/21 108 lb 7.5 oz (49.2 kg)  09/29/21 113 lb (51.3 kg)      Labs reviewed: Basic Metabolic Panel: Recent Labs    12/15/20 1428 06/12/21 1616 10/04/21 0547 10/05/21 0530 12/02/21 2133  NA 138   < > 141 135 144  K 4.2   < > 3.9 3.7 4.3  CL 105   < > 112* 105 112*  CO2 25   < > '23 23 27  '$ GLUCOSE 88   < > 78 97 106*  BUN 17   < > 19 21 41*  CREATININE 0.91*   < > 0.83 0.87 0.96  CALCIUM 9.1   < > 8.8* 8.7* 9.7  MG  --   --  2.2 1.9  --   PHOS  --    --  3.2 2.7  --   TSH 3.36  --  3.031  --   --    < > = values in this interval not displayed.   Liver Function Tests: Recent Labs    10/03/21 1527 10/04/21 0547 10/05/21 0530  AST '20 23 29  '$ ALT '11 12 18  '$ ALKPHOS 35* 33* 31*  BILITOT 0.7 0.7 0.8  PROT 8.2* 8.1 7.6  ALBUMIN 3.7 3.6 3.4*   No results for input(s): "LIPASE", "AMYLASE" in the last 8760 hours. No results for input(s): "AMMONIA" in the last 8760 hours. CBC: Recent Labs    10/04/21 0547 10/05/21 0530 12/02/21 2133  WBC 3.5* 3.0* 5.1  NEUTROABS 1.9 1.3* 2.9  HGB 11.7* 11.2* 12.5  HCT 37.4 34.2* 38.8  MCV 99.2 96.6 97.0  PLT 102* 132* 151   Lipid Panel: Recent Labs    12/15/20 1428  CHOL 213*  HDL 69  LDLCALC 127*  TRIG 78  CHOLHDL 3.1   TSH: Recent Labs    12/15/20 1428 10/04/21 0547  TSH 3.36 3.031   A1C: No results found for: "HGBA1C"   Assessment/Plan  1. Dementia without behavioral disturbance ([place]) -she is currently under hospice care, therefore physical therapy would not be covered. Discussed this with her daughter and she expressed understanding. She will call hospice to clarify what they will cover.   Carlos American. Harle Battiest  [facility] [phone]    Virtual Visit via telephone- unable to connect virtually  I connected with patient on 12/14/21 at  2:40 PM EDT by telelphone and verified that I am speaking with the correct person using two identifiers.  Location: Patient: home Provider: [facility].    I discussed the limitations, risks, security and privacy concerns of performing an evaluation and management service by telephone and the availability of in person appointments. I also discussed with the patient that there may be a patient responsible charge related to this service. The patient expressed understanding and agreed to proceed.   I discussed the assessment and treatment plan with the patient. The patient was provided an opportunity to  ask questions and all were answered. The patient agreed with the plan and demonstrated an understanding of the instructions.   The patient was advised to  call back or seek an in-person evaluation if the symptoms worsen or if the condition fails to improve as anticipated.  I provided 12 minutes of non-face-to-face time during this encounter.  Carlos American. Harle Battiest Avs printed and mailed

## 2021-12-14 NOTE — Telephone Encounter (Signed)
Kathleen Gallegos, Kathleen Gallegos are scheduled for a virtual visit with your provider today.    Just as we do with appointments in the office, we must obtain your consent to participate.  Your consent will be active for this visit and any virtual visit you may have with one of our providers in the next 365 days.    If you have a MyChart account, I can also send a copy of this consent to you electronically.  All virtual visits are billed to your insurance company just like a traditional visit in the office.  As this is a virtual visit, video technology does not allow for your provider to perform a traditional examination.  This may limit your provider's ability to fully assess your condition.  If your provider identifies any concerns that need to be evaluated in person or the need to arrange testing such as labs, EKG, etc, we will make arrangements to do so.    Although advances in technology are sophisticated, we cannot ensure that it will always work on either your end or our end.  If the connection with a video visit is poor, we may have to switch to a telephone visit.  With either a video or telephone visit, we are not always able to ensure that we have a secure connection.   I need to obtain your verbal consent now.   Are you willing to proceed with your visit today?   Kathleen Gallegos has provided verbal consent on 12/14/2021 for a virtual visit (video or telephone).   Debe Coder, [place] 12/14/2021  2:49 PM

## 2021-12-25 DIAGNOSIS — Z7401 Bed confinement status: Secondary | ICD-10-CM | POA: Diagnosis not present

## 2021-12-25 DIAGNOSIS — R5381 Other malaise: Secondary | ICD-10-CM | POA: Diagnosis not present

## 2021-12-25 DIAGNOSIS — R279 Unspecified lack of coordination: Secondary | ICD-10-CM | POA: Diagnosis not present

## 2021-12-31 DIAGNOSIS — R404 Transient alteration of awareness: Secondary | ICD-10-CM | POA: Diagnosis not present

## 2021-12-31 DIAGNOSIS — R064 Hyperventilation: Secondary | ICD-10-CM | POA: Diagnosis not present

## 2021-12-31 DIAGNOSIS — R0902 Hypoxemia: Secondary | ICD-10-CM | POA: Diagnosis not present

## 2021-12-31 DIAGNOSIS — I499 Cardiac arrhythmia, unspecified: Secondary | ICD-10-CM | POA: Diagnosis not present

## 2022-01-06 DIAGNOSIS — 419620001 Death: Secondary | SNOMED CT | POA: Diagnosis not present

## 2022-01-06 DEATH — deceased

## 2022-01-10 ENCOUNTER — Telehealth: Payer: Self-pay

## 2022-01-10 NOTE — Telephone Encounter (Signed)
  [name] wants to know how would she go about speaking with you directly about her mom's passing. She is going through some things and believes only you would understand. She says it was offered to her if she needed to talk she can call on you. She says it goes beyond the medical. She says you gave her a call around the time of the funeral and she is struggling and appreciates the type of person you are. She can be reached at [phone]

## 2022-01-10 NOTE — Telephone Encounter (Signed)
Called returned to patient's daughter Kathleen Gallegos states would like to talk to provide not concerning the patient but her own personal concerns.feels like provider was able to connect with patient,caring and understanding.Feels like provider understands her too.Discussed with patient's daughter option for Brief ment counseling with hospice or seeing a counseling service for counseling but daughter states feels more open with provider since I understand her and care.wonders how she can talk in person. I have discussed with her that she will have to establish care at [facility] as a new patient. Also asked if she could do a video visit but was told will need to establish care in person first as a new patient for proper documentation.Request provider to check if there's any other way that can be done.advised will verify with the office manage then get back to her since she has her own primary doctor and would like to continue seeing.

## 2022-03-22 ENCOUNTER — Ambulatory Visit: Payer: Medicare Other | Admitting: Family
